# Patient Record
Sex: Female | Born: 1950 | Race: White | Hispanic: No | Smoking: Former smoker
Health system: Southern US, Community
[De-identification: ages and names within clinical notes are randomized; demographics above are authoritative.]

## PROBLEM LIST (undated history)

## (undated) DIAGNOSIS — I639 Cerebral infarction, unspecified: Secondary | ICD-10-CM

## (undated) DIAGNOSIS — M1990 Unspecified osteoarthritis, unspecified site: Secondary | ICD-10-CM

## (undated) DIAGNOSIS — E039 Hypothyroidism, unspecified: Secondary | ICD-10-CM

## (undated) DIAGNOSIS — I10 Essential (primary) hypertension: Secondary | ICD-10-CM

## (undated) DIAGNOSIS — J189 Pneumonia, unspecified organism: Secondary | ICD-10-CM

## (undated) DIAGNOSIS — E079 Disorder of thyroid, unspecified: Secondary | ICD-10-CM

## (undated) DIAGNOSIS — K219 Gastro-esophageal reflux disease without esophagitis: Secondary | ICD-10-CM

## (undated) DIAGNOSIS — T7840XA Allergy, unspecified, initial encounter: Secondary | ICD-10-CM

## (undated) HISTORY — DX: Allergy, unspecified, initial encounter: T78.40XA

## (undated) HISTORY — PX: APPENDECTOMY: SHX54

## (undated) HISTORY — PX: CATARACT EXTRACTION: SUR2

## (undated) HISTORY — DX: Disorder of thyroid, unspecified: E07.9

## (undated) HISTORY — PX: BREAST SURGERY: SHX581

## (undated) HISTORY — DX: Essential (primary) hypertension: I10

## (undated) HISTORY — PX: TUBAL LIGATION: SHX77

## (undated) HISTORY — PX: ABDOMINAL HYSTERECTOMY: SHX81

## (undated) HISTORY — PX: CHOLECYSTECTOMY: SHX55

## (undated) HISTORY — DX: Unspecified osteoarthritis, unspecified site: M19.90

---

## 2015-08-04 DIAGNOSIS — I10 Essential (primary) hypertension: Secondary | ICD-10-CM | POA: Diagnosis not present

## 2015-08-04 DIAGNOSIS — K219 Gastro-esophageal reflux disease without esophagitis: Secondary | ICD-10-CM | POA: Diagnosis not present

## 2015-08-04 DIAGNOSIS — Z0000 Encounter for general adult medical examination without abnormal findings: Secondary | ICD-10-CM | POA: Diagnosis not present

## 2015-08-04 DIAGNOSIS — E781 Pure hyperglyceridemia: Secondary | ICD-10-CM | POA: Insufficient documentation

## 2015-08-04 DIAGNOSIS — E782 Mixed hyperlipidemia: Secondary | ICD-10-CM | POA: Diagnosis not present

## 2015-08-04 DIAGNOSIS — M8580 Other specified disorders of bone density and structure, unspecified site: Secondary | ICD-10-CM | POA: Insufficient documentation

## 2015-08-04 DIAGNOSIS — E039 Hypothyroidism, unspecified: Secondary | ICD-10-CM | POA: Insufficient documentation

## 2015-08-04 DIAGNOSIS — F5101 Primary insomnia: Secondary | ICD-10-CM | POA: Insufficient documentation

## 2015-08-04 DIAGNOSIS — R21 Rash and other nonspecific skin eruption: Secondary | ICD-10-CM | POA: Diagnosis not present

## 2015-08-04 DIAGNOSIS — Z79899 Other long term (current) drug therapy: Secondary | ICD-10-CM | POA: Diagnosis not present

## 2015-11-20 DIAGNOSIS — R21 Rash and other nonspecific skin eruption: Secondary | ICD-10-CM | POA: Diagnosis not present

## 2015-11-20 DIAGNOSIS — I10 Essential (primary) hypertension: Secondary | ICD-10-CM | POA: Diagnosis not present

## 2015-11-20 DIAGNOSIS — R252 Cramp and spasm: Secondary | ICD-10-CM | POA: Diagnosis not present

## 2015-11-27 DIAGNOSIS — H2513 Age-related nuclear cataract, bilateral: Secondary | ICD-10-CM | POA: Diagnosis not present

## 2015-11-27 DIAGNOSIS — H25013 Cortical age-related cataract, bilateral: Secondary | ICD-10-CM | POA: Diagnosis not present

## 2015-12-03 DIAGNOSIS — H2511 Age-related nuclear cataract, right eye: Secondary | ICD-10-CM | POA: Diagnosis not present

## 2015-12-17 DIAGNOSIS — H2512 Age-related nuclear cataract, left eye: Secondary | ICD-10-CM | POA: Diagnosis not present

## 2015-12-17 DIAGNOSIS — H2511 Age-related nuclear cataract, right eye: Secondary | ICD-10-CM | POA: Diagnosis not present

## 2016-01-07 DIAGNOSIS — H2512 Age-related nuclear cataract, left eye: Secondary | ICD-10-CM | POA: Diagnosis not present

## 2016-03-03 DIAGNOSIS — Z1231 Encounter for screening mammogram for malignant neoplasm of breast: Secondary | ICD-10-CM | POA: Diagnosis not present

## 2016-03-03 DIAGNOSIS — Z23 Encounter for immunization: Secondary | ICD-10-CM | POA: Diagnosis not present

## 2016-03-03 DIAGNOSIS — E039 Hypothyroidism, unspecified: Secondary | ICD-10-CM | POA: Diagnosis not present

## 2016-03-03 DIAGNOSIS — E782 Mixed hyperlipidemia: Secondary | ICD-10-CM | POA: Diagnosis not present

## 2016-03-03 DIAGNOSIS — I10 Essential (primary) hypertension: Secondary | ICD-10-CM | POA: Diagnosis not present

## 2016-03-18 DIAGNOSIS — Z1231 Encounter for screening mammogram for malignant neoplasm of breast: Secondary | ICD-10-CM | POA: Diagnosis not present

## 2016-05-27 DIAGNOSIS — E039 Hypothyroidism, unspecified: Secondary | ICD-10-CM | POA: Diagnosis not present

## 2016-05-27 DIAGNOSIS — I10 Essential (primary) hypertension: Secondary | ICD-10-CM | POA: Diagnosis not present

## 2016-05-27 DIAGNOSIS — E782 Mixed hyperlipidemia: Secondary | ICD-10-CM | POA: Diagnosis not present

## 2016-06-03 DIAGNOSIS — M8580 Other specified disorders of bone density and structure, unspecified site: Secondary | ICD-10-CM | POA: Diagnosis not present

## 2016-06-03 DIAGNOSIS — E2839 Other primary ovarian failure: Secondary | ICD-10-CM | POA: Diagnosis not present

## 2016-06-03 DIAGNOSIS — Z9189 Other specified personal risk factors, not elsewhere classified: Secondary | ICD-10-CM | POA: Diagnosis not present

## 2016-06-03 DIAGNOSIS — R7301 Impaired fasting glucose: Secondary | ICD-10-CM | POA: Diagnosis not present

## 2016-06-03 DIAGNOSIS — Z1211 Encounter for screening for malignant neoplasm of colon: Secondary | ICD-10-CM | POA: Diagnosis not present

## 2016-06-03 DIAGNOSIS — E039 Hypothyroidism, unspecified: Secondary | ICD-10-CM | POA: Diagnosis not present

## 2016-06-03 DIAGNOSIS — D72819 Decreased white blood cell count, unspecified: Secondary | ICD-10-CM | POA: Diagnosis not present

## 2016-06-03 DIAGNOSIS — I10 Essential (primary) hypertension: Secondary | ICD-10-CM | POA: Diagnosis not present

## 2016-06-03 DIAGNOSIS — Z1159 Encounter for screening for other viral diseases: Secondary | ICD-10-CM | POA: Diagnosis not present

## 2016-06-03 DIAGNOSIS — E782 Mixed hyperlipidemia: Secondary | ICD-10-CM | POA: Diagnosis not present

## 2016-07-01 DIAGNOSIS — E2839 Other primary ovarian failure: Secondary | ICD-10-CM | POA: Diagnosis not present

## 2016-07-01 DIAGNOSIS — Z780 Asymptomatic menopausal state: Secondary | ICD-10-CM | POA: Diagnosis not present

## 2016-09-09 DIAGNOSIS — I10 Essential (primary) hypertension: Secondary | ICD-10-CM | POA: Diagnosis not present

## 2016-12-01 DIAGNOSIS — R51 Headache: Secondary | ICD-10-CM | POA: Diagnosis not present

## 2016-12-01 DIAGNOSIS — I10 Essential (primary) hypertension: Secondary | ICD-10-CM | POA: Diagnosis not present

## 2016-12-01 DIAGNOSIS — J0100 Acute maxillary sinusitis, unspecified: Secondary | ICD-10-CM | POA: Diagnosis not present

## 2017-02-12 DIAGNOSIS — S339XXA Sprain of unspecified parts of lumbar spine and pelvis, initial encounter: Secondary | ICD-10-CM | POA: Diagnosis not present

## 2017-02-12 DIAGNOSIS — I10 Essential (primary) hypertension: Secondary | ICD-10-CM | POA: Diagnosis not present

## 2017-03-17 DIAGNOSIS — I10 Essential (primary) hypertension: Secondary | ICD-10-CM | POA: Diagnosis not present

## 2017-03-17 DIAGNOSIS — Z23 Encounter for immunization: Secondary | ICD-10-CM | POA: Diagnosis not present

## 2017-05-30 DIAGNOSIS — J208 Acute bronchitis due to other specified organisms: Secondary | ICD-10-CM | POA: Diagnosis not present

## 2017-05-30 DIAGNOSIS — N643 Galactorrhea not associated with childbirth: Secondary | ICD-10-CM | POA: Diagnosis not present

## 2017-06-20 DIAGNOSIS — R928 Other abnormal and inconclusive findings on diagnostic imaging of breast: Secondary | ICD-10-CM | POA: Diagnosis not present

## 2017-06-20 DIAGNOSIS — N6489 Other specified disorders of breast: Secondary | ICD-10-CM | POA: Diagnosis not present

## 2017-06-20 DIAGNOSIS — N6452 Nipple discharge: Secondary | ICD-10-CM | POA: Diagnosis not present

## 2017-07-28 DIAGNOSIS — E039 Hypothyroidism, unspecified: Secondary | ICD-10-CM | POA: Diagnosis not present

## 2017-07-28 DIAGNOSIS — E038 Other specified hypothyroidism: Secondary | ICD-10-CM | POA: Diagnosis not present

## 2017-07-28 DIAGNOSIS — I10 Essential (primary) hypertension: Secondary | ICD-10-CM | POA: Diagnosis not present

## 2017-07-28 DIAGNOSIS — M19049 Primary osteoarthritis, unspecified hand: Secondary | ICD-10-CM | POA: Diagnosis not present

## 2017-07-28 DIAGNOSIS — E782 Mixed hyperlipidemia: Secondary | ICD-10-CM | POA: Diagnosis not present

## 2017-09-19 DIAGNOSIS — R928 Other abnormal and inconclusive findings on diagnostic imaging of breast: Secondary | ICD-10-CM | POA: Diagnosis not present

## 2017-09-19 DIAGNOSIS — R922 Inconclusive mammogram: Secondary | ICD-10-CM | POA: Diagnosis not present

## 2017-09-19 DIAGNOSIS — N6489 Other specified disorders of breast: Secondary | ICD-10-CM | POA: Diagnosis not present

## 2017-11-07 DIAGNOSIS — M189 Osteoarthritis of first carpometacarpal joint, unspecified: Secondary | ICD-10-CM | POA: Insufficient documentation

## 2017-11-07 DIAGNOSIS — M13841 Other specified arthritis, right hand: Secondary | ICD-10-CM | POA: Diagnosis not present

## 2017-11-07 DIAGNOSIS — M13842 Other specified arthritis, left hand: Secondary | ICD-10-CM | POA: Diagnosis not present

## 2017-11-07 DIAGNOSIS — M79642 Pain in left hand: Secondary | ICD-10-CM | POA: Diagnosis not present

## 2017-11-07 DIAGNOSIS — M1812 Unilateral primary osteoarthritis of first carpometacarpal joint, left hand: Secondary | ICD-10-CM | POA: Diagnosis not present

## 2017-11-07 DIAGNOSIS — M79641 Pain in right hand: Secondary | ICD-10-CM | POA: Diagnosis not present

## 2017-11-21 DIAGNOSIS — R200 Anesthesia of skin: Secondary | ICD-10-CM | POA: Diagnosis not present

## 2017-11-29 DIAGNOSIS — M79642 Pain in left hand: Secondary | ICD-10-CM | POA: Diagnosis not present

## 2017-11-29 DIAGNOSIS — M65839 Other synovitis and tenosynovitis, unspecified forearm: Secondary | ICD-10-CM | POA: Diagnosis not present

## 2017-11-29 DIAGNOSIS — R200 Anesthesia of skin: Secondary | ICD-10-CM | POA: Diagnosis not present

## 2017-11-29 DIAGNOSIS — M79641 Pain in right hand: Secondary | ICD-10-CM | POA: Diagnosis not present

## 2017-12-20 DIAGNOSIS — N6320 Unspecified lump in the left breast, unspecified quadrant: Secondary | ICD-10-CM | POA: Diagnosis not present

## 2017-12-20 DIAGNOSIS — N6452 Nipple discharge: Secondary | ICD-10-CM | POA: Diagnosis not present

## 2018-01-04 ENCOUNTER — Other Ambulatory Visit: Payer: Self-pay | Admitting: Family Medicine

## 2018-01-04 DIAGNOSIS — N6320 Unspecified lump in the left breast, unspecified quadrant: Secondary | ICD-10-CM

## 2018-01-05 DIAGNOSIS — Z6829 Body mass index (BMI) 29.0-29.9, adult: Secondary | ICD-10-CM | POA: Diagnosis not present

## 2018-01-05 DIAGNOSIS — I10 Essential (primary) hypertension: Secondary | ICD-10-CM | POA: Diagnosis not present

## 2018-01-05 DIAGNOSIS — M5412 Radiculopathy, cervical region: Secondary | ICD-10-CM | POA: Diagnosis not present

## 2018-01-09 ENCOUNTER — Ambulatory Visit
Admission: RE | Admit: 2018-01-09 | Discharge: 2018-01-09 | Disposition: A | Discharge status: Home / Self Care | Payer: PPO | Source: Ambulatory Visit | Attending: Family Medicine | Admitting: Family Medicine

## 2018-01-09 DIAGNOSIS — N6320 Unspecified lump in the left breast, unspecified quadrant: Secondary | ICD-10-CM

## 2018-01-09 DIAGNOSIS — D242 Benign neoplasm of left breast: Secondary | ICD-10-CM | POA: Diagnosis not present

## 2018-01-09 MED ORDER — GADOBENATE DIMEGLUMINE 529 MG/ML IV SOLN
15.0000 mL | Freq: Once | INTRAVENOUS | Status: AC | PRN
  Administered 2018-01-09: 15 mL via INTRAVENOUS

## 2018-01-12 DIAGNOSIS — D369 Benign neoplasm, unspecified site: Secondary | ICD-10-CM | POA: Diagnosis not present

## 2018-01-12 DIAGNOSIS — E782 Mixed hyperlipidemia: Secondary | ICD-10-CM | POA: Diagnosis not present

## 2018-01-12 DIAGNOSIS — I10 Essential (primary) hypertension: Secondary | ICD-10-CM | POA: Diagnosis not present

## 2018-01-12 DIAGNOSIS — E039 Hypothyroidism, unspecified: Secondary | ICD-10-CM | POA: Diagnosis not present

## 2018-01-19 ENCOUNTER — Other Ambulatory Visit: Payer: Self-pay | Admitting: General Surgery

## 2018-01-19 DIAGNOSIS — D242 Benign neoplasm of left breast: Secondary | ICD-10-CM | POA: Diagnosis not present

## 2018-01-19 DIAGNOSIS — L4050 Arthropathic psoriasis, unspecified: Secondary | ICD-10-CM | POA: Diagnosis not present

## 2018-01-19 DIAGNOSIS — Z923 Personal history of irradiation: Secondary | ICD-10-CM | POA: Diagnosis not present

## 2018-01-19 DIAGNOSIS — Z6829 Body mass index (BMI) 29.0-29.9, adult: Secondary | ICD-10-CM | POA: Diagnosis not present

## 2018-01-19 DIAGNOSIS — I10 Essential (primary) hypertension: Secondary | ICD-10-CM | POA: Diagnosis not present

## 2018-01-25 ENCOUNTER — Encounter: Payer: Self-pay | Admitting: Family Medicine

## 2018-01-25 ENCOUNTER — Ambulatory Visit (INDEPENDENT_AMBULATORY_CARE_PROVIDER_SITE_OTHER): Payer: PPO | Admitting: Family Medicine

## 2018-01-25 VITALS — BP 160/96 | HR 64 | Ht 64.0 in | Wt 170.8 lb

## 2018-01-25 DIAGNOSIS — D369 Benign neoplasm, unspecified site: Secondary | ICD-10-CM

## 2018-01-25 DIAGNOSIS — E890 Postprocedural hypothyroidism: Secondary | ICD-10-CM | POA: Diagnosis not present

## 2018-01-25 DIAGNOSIS — I10 Essential (primary) hypertension: Secondary | ICD-10-CM | POA: Diagnosis not present

## 2018-01-25 DIAGNOSIS — M180 Bilateral primary osteoarthritis of first carpometacarpal joints: Secondary | ICD-10-CM

## 2018-01-25 MED ORDER — AMLODIPINE BESYLATE 10 MG PO TABS: 10.0000 mg | ORAL_TABLET | Freq: Every day | ORAL | 3 refills | Status: DC

## 2018-01-25 NOTE — Assessment & Plan Note (Signed)
Seen by orthopedist, dx with psoriatic arthritis She will continue curcumin for now  Cystic lesion noted on dorsum of L foot, not bothersome at this time.  She will let me know if this changes.

## 2018-01-25 NOTE — Assessment & Plan Note (Signed)
S/p tx with I-131 for hyperthyroidism Update TSH today

## 2018-01-25 NOTE — Assessment & Plan Note (Signed)
Has f/u mammogram in 6 months with the [facility].  Will decide about surgery at that time.

## 2018-01-25 NOTE — Assessment & Plan Note (Addendum)
BP elevated today in clinic Normal neuro exam, no focal deficits noted.  ?if dizziness from elevated BP Has not been well controlled with current medication Feels like she may be having side effects related to this.  Will change to amlodipine Reminded to follow low salt diet.  F/u 2-3 weeks

## 2018-01-25 NOTE — Progress Notes (Signed)
Megan Martinez - 67 y.o. female MRN [id number]  Date of birth: 1951-03-08  Subjective Chief Complaint  Patient presents with  . Hypertension    HPI Megan Martinez is a 67 y.o. female with a history of HTN, postablative hypothyroidism, hyperlipidemia, and arthritis here today to establish care with new pcp.    -HTN:  History of HTN, current treatment with diovan-hct.  She also is taking a potassium supplement with this.  Had episode yesterday where she felt dizzy and didn't feel like her legs would support her while she was at work.  Had to have some help getting back to her office.  This affected both legs.  This lasted several minutes and then resolved.  Checked out by ems who recommended that she follow up with PCP.  She feels like her blood pressure medication is not working as well as it should be.  Her blood pressure remains high and she has had a persistent cough since starting this medication.  She does take as directed.  She is quite active and denies a high salt diet.   -arthritis:  Noticed enlarged painful joints of the feet and hands.  Seen by [facility] and was told she has psoriatic arthritis based on xray findings.  She is managing with anti-inflammatory medication as need for now.  Denies any rashes.    -Hypothyroidism:  History of hyperthyroidism s/p I131 tx, now with hypothyroidism.  Denies any symptoms of hyper/hypo-thyroidism at this time.  She is compliant with medication.   -Breast mass:  Had recent mammogram and biopsy showing intraductal papilloma.  Decreased pain and nipple discharge at this time.  Has f/u mammogram in 6 months, will decide on surgery at that time.   ROS:  A comprehensive ROS was completed and negative except as noted per HPI  Allergies  Allergen Reactions  . Latex Itching and Rash  . Statins Other (See Comments)    Myalgias, "sick"   . Hydrocodone-Homatropine   . Penicillin G   . Amoxicillin Rash  . Codeine Nausea Only    Past  Medical History:  Diagnosis Date  . Allergy   . Arthritis   . Hypertension   . Thyroid disease     Past Surgical History:  Procedure Laterality Date  . ABDOMINAL HYSTERECTOMY    . APPENDECTOMY    . CATARACT EXTRACTION    . CHOLECYSTECTOMY    . TUBAL LIGATION      Social History   Socioeconomic History  . Marital status: Married    Spouse name: Not on file  . Number of children: Not on file  . Years of education: Not on file  . Highest education level: Not on file  Occupational History  . Not on file  Social Needs  . Financial resource strain: Not on file  . Food insecurity:    Worry: Not on file    Inability: Not on file  . Transportation needs:    Medical: Not on file    Non-medical: Not on file  Tobacco Use  . Smoking status: Former Research scientist (life sciences)  . Smokeless tobacco: Never Used  Substance and Sexual Activity  . Alcohol use: Yes    Comment: occass.   . Drug use: Never  . Sexual activity: Not on file  Lifestyle  . Physical activity:    Days per week: Not on file    Minutes per session: Not on file  . Stress: Not on file  Relationships  . Social connections:  Talks on phone: Not on file    Gets together: Not on file    Attends religious service: Not on file    Active member of club or organization: Not on file    Attends meetings of clubs or organizations: Not on file    Relationship status: Not on file  Other Topics Concern  . Not on file  Social History Narrative  . Not on file    History reviewed. No pertinent family history.  Health Maintenance  Topic Date Due  . Hepatitis C Screening  1950-06-07  . TETANUS/TDAP  07/29/1969  . MAMMOGRAM  07/29/2000  . COLONOSCOPY  07/29/2000  . DEXA SCAN  07/30/2015  . PNA vac Low Risk Adult (1 of 2 - PCV13) 07/30/2015  . INFLUENZA VACCINE  12/22/2017     ----------------------------------------------------------------------------------------------------------------------------------------------------------------------------------------------------------------- Physical Exam BP (!) 160/96   Pulse 64   Ht 5\' 4"  (1.626 m)   Wt 170 lb 12.8 oz (77.5 kg)   SpO2 98%   BMI 29.32 kg/m   Physical Exam  Constitutional: She is oriented to person, place, and time. No distress.  HENT:  Head: Normocephalic and atraumatic.  Mouth/Throat: Oropharynx is clear and moist.  Eyes: No scleral icterus.  Neck: Normal range of motion. Neck supple. No thyromegaly present.  Cardiovascular: Normal rate, regular rhythm, normal heart sounds and intact distal pulses.  Pulmonary/Chest: Effort normal and breath sounds normal.  Musculoskeletal:  Cystic nodule on dorsum of L foot.   Hands with mild swelling and tender nodules on fingers.   Lymphadenopathy:    She has no cervical adenopathy.  Neurological: She is alert and oriented to person, place, and time. No cranial nerve deficit. She exhibits normal muscle tone. Coordination normal.  Normal gait Negative rhomberg  Skin: Skin is warm and dry. No rash noted.  Psychiatric: She has a normal mood and affect. Her behavior is normal.    ------------------------------------------------------------------------------------------------------------------------------------------------------------------------------------------------------------------- Assessment and Plan  Postablative hypothyroidism S/p tx with I-131 for hyperthyroidism Update TSH today  Essential hypertension BP elevated today in clinic Normal neuro exam, no focal deficits noted.  ?if dizziness from elevated BP Has not been well controlled with current medication Feels like she may be having side effects related to this.  Will change to amlodipine Reminded to follow low salt diet.  F/u 2-3 weeks  Osteoarthritis of carpometacarpal (CMC)  joint of thumb Seen by orthopedist, dx with psoriatic arthritis She will continue curcumin for now  Cystic lesion noted on dorsum of L foot, not bothersome at this time.  She will let me know if this changes.   Intraductal papilloma Has f/u mammogram in 6 months with the breast center.  Will decide about surgery at that time.

## 2018-01-25 NOTE — Patient Instructions (Signed)
It was very nice to meet you Stop valsartan-Hctz Start amlodipine Follow up with me in 2-3 weeks to review blood pressure.

## 2018-01-26 LAB — COMPREHENSIVE METABOLIC PANEL
ALT: 19 U/L (ref 0–35)
AST: 22 U/L (ref 0–37)
Albumin: 4.5 g/dL (ref 3.5–5.2)
Alkaline Phosphatase: 43 U/L (ref 39–117)
BUN: 10 mg/dL (ref 6–23)
CO2: 32 mEq/L (ref 19–32)
Calcium: 9.9 mg/dL (ref 8.4–10.5)
Chloride: 101 mEq/L (ref 96–112)
Creatinine, Ser: 0.89 mg/dL (ref 0.40–1.20)
GFR: 67.14 mL/min (ref >60.00)
Glucose, Bld: 123 mg/dL — ABNORMAL HIGH (ref 70–99)
Potassium: 4.1 mEq/L (ref 3.5–5.1)
Sodium: 141 mEq/L (ref 135–145)
Total Bilirubin: 0.5 mg/dL (ref 0.2–1.2)
Total Protein: 7.5 g/dL (ref 6.0–8.3)

## 2018-01-26 LAB — CBC
HCT: 42.7 % (ref 36.0–46.0)
Hemoglobin: 14.5 g/dL (ref 12.0–15.0)
MCHC: 33.9 g/dL (ref 30.0–36.0)
MCV: 84.4 fl (ref 78.0–100.0)
Platelets: 277 10*3/uL (ref 150.0–400.0)
RBC: 5.06 Mil/uL (ref 3.87–5.11)
RDW: 14.1 % (ref 11.5–15.5)
WBC: 5.4 10*3/uL (ref 4.0–10.5)

## 2018-01-26 LAB — TSH: TSH: 1.67 u[IU]/mL (ref 0.35–4.50)

## 2018-01-30 NOTE — Progress Notes (Signed)
Glucose is slightlyl elevated, ok for non-fasting labs. Other labs are normal.

## 2018-02-08 ENCOUNTER — Ambulatory Visit (INDEPENDENT_AMBULATORY_CARE_PROVIDER_SITE_OTHER): Payer: PPO | Admitting: Family Medicine

## 2018-02-08 ENCOUNTER — Encounter: Payer: Self-pay | Admitting: Family Medicine

## 2018-02-08 VITALS — BP 160/90 | HR 81 | Temp 98.0°F | Ht 64.0 in | Wt 170.6 lb

## 2018-02-08 DIAGNOSIS — I10 Essential (primary) hypertension: Secondary | ICD-10-CM

## 2018-02-08 MED ORDER — CANDESARTAN CILEXETIL-HCTZ 32-25 MG PO TABS: 1.0000 | ORAL_TABLET | Freq: Every day | ORAL | 1 refills | Status: DC

## 2018-02-08 NOTE — Patient Instructions (Addendum)
Start new medication for Blood pressure Stop amlodipine Follow a low salt diet Return in 1 month   DASH Eating Plan DASH stands for "Dietary Approaches to Stop Hypertension." The DASH eating plan is a healthy eating plan that has been shown to reduce high blood pressure (hypertension). It may also reduce your risk for type 2 diabetes, heart disease, and stroke. The DASH eating plan may also help with weight loss. What are tips for following this plan? General guidelines  Avoid eating more than 2,300 mg (milligrams) of salt (sodium) a day. If you have hypertension, you may need to reduce your sodium intake to 1,500 mg a day.  Limit alcohol intake to no more than 1 drink a day for nonpregnant women and 2 drinks a day for men. One drink equals 12 oz of beer, 5 oz of wine, or 1 oz of hard liquor.  Work with your health care provider to maintain a healthy body weight or to lose weight. Ask what an ideal weight is for you.  Get at least 30 minutes of exercise that causes your heart to beat faster (aerobic exercise) most days of the week. Activities may include walking, swimming, or biking.  Work with your health care provider or diet and nutrition specialist (dietitian) to adjust your eating plan to your individual calorie needs. Reading food labels  Check food labels for the amount of sodium per serving. Choose foods with less than 5 percent of the Daily Value of sodium. Generally, foods with less than 300 mg of sodium per serving fit into this eating plan.  To find whole grains, look for the word "whole" as the first word in the ingredient list. Shopping  Buy products labeled as "low-sodium" or "no salt added."  Buy fresh foods. Avoid canned foods and premade or frozen meals. Cooking  Avoid adding salt when cooking. Use salt-free seasonings or herbs instead of table salt or sea salt. Check with your health care provider or pharmacist before using salt substitutes.  Do not fry foods.  Cook foods using healthy methods such as baking, boiling, grilling, and broiling instead.  Cook with heart-healthy oils, such as olive, canola, soybean, or sunflower oil. Meal planning   Eat a balanced diet that includes: ? 5 or more servings of fruits and vegetables each day. At each meal, try to fill half of your plate with fruits and vegetables. ? Up to 6-8 servings of whole grains each day. ? Less than 6 oz of lean meat, poultry, or fish each day. A 3-oz serving of meat is about the same size as a deck of cards. One egg equals 1 oz. ? 2 servings of low-fat dairy each day. ? A serving of nuts, seeds, or beans 5 times each week. ? Heart-healthy fats. Healthy fats called Omega-3 fatty acids are found in foods such as flaxseeds and coldwater fish, like sardines, salmon, and mackerel.  Limit how much you eat of the following: ? Canned or prepackaged foods. ? Food that is high in trans fat, such as fried foods. ? Food that is high in saturated fat, such as fatty meat. ? Sweets, desserts, sugary drinks, and other foods with added sugar. ? Full-fat dairy products.  Do not salt foods before eating.  Try to eat at least 2 vegetarian meals each week.  Eat more home-cooked food and less restaurant, buffet, and fast food.  When eating at a restaurant, ask that your food be prepared with less salt or no salt, if possible.  What foods are recommended? The items listed may not be a complete list. Talk with your dietitian about what dietary choices are best for you. Grains Whole-grain or whole-wheat bread. Whole-grain or whole-wheat pasta. Brown rice. [place]. Bulgur. Whole-grain and low-sodium cereals. Pita bread. Low-fat, low-sodium crackers. Whole-wheat flour tortillas. Vegetables Fresh or frozen vegetables (raw, steamed, roasted, or grilled). Low-sodium or reduced-sodium tomato and vegetable juice. Low-sodium or reduced-sodium tomato sauce and tomato paste. Low-sodium or reduced-sodium  canned vegetables. Fruits All fresh, dried, or frozen fruit. Canned fruit in natural juice (without added sugar). Meat and other protein foods Skinless chicken or [place]. Ground chicken or [place]. Pork with fat trimmed off. Fish and seafood. Egg whites. Dried beans, peas, or lentils. Unsalted nuts, nut butters, and seeds. Unsalted canned beans. Lean cuts of beef with fat trimmed off. Low-sodium, lean deli meat. Dairy Low-fat (1%) or fat-free (skim) milk. Fat-free, low-fat, or reduced-fat cheeses. Nonfat, low-sodium ricotta or cottage cheese. Low-fat or nonfat yogurt. Low-fat, low-sodium cheese. Fats and oils Soft margarine without trans fats. Vegetable oil. Low-fat, reduced-fat, or light mayonnaise and salad dressings (reduced-sodium). Canola, safflower, olive, soybean, and sunflower oils. Avocado. Seasoning and other foods Herbs. Spices. Seasoning mixes without salt. Unsalted popcorn and pretzels. Fat-free sweets. What foods are not recommended? The items listed may not be a complete list. Talk with your dietitian about what dietary choices are best for you. Grains Baked goods made with fat, such as croissants, muffins, or some breads. Dry pasta or rice meal packs. Vegetables Creamed or fried vegetables. Vegetables in a cheese sauce. Regular canned vegetables (not low-sodium or reduced-sodium). Regular canned tomato sauce and paste (not low-sodium or reduced-sodium). Regular tomato and vegetable juice (not low-sodium or reduced-sodium). Angie Fava. Olives. Fruits Canned fruit in a light or heavy syrup. Fried fruit. Fruit in cream or butter sauce. Meat and other protein foods Fatty cuts of meat. Ribs. Fried meat. Berniece Salines. Sausage. Bologna and other processed lunch meats. Salami. Fatback. Hotdogs. Bratwurst. Salted nuts and seeds. Canned beans with added salt. Canned or smoked fish. Whole eggs or egg yolks. Chicken or [place] with skin. Dairy Whole or 2% milk, cream, and half-and-half. Whole or  full-fat cream cheese. Whole-fat or sweetened yogurt. Full-fat cheese. Nondairy creamers. Whipped toppings. Processed cheese and cheese spreads. Fats and oils Butter. Stick margarine. Lard. Shortening. Ghee. Bacon fat. Tropical oils, such as coconut, palm kernel, or palm oil. Seasoning and other foods Salted popcorn and pretzels. Onion salt, garlic salt, seasoned salt, table salt, and sea salt. Worcestershire sauce. Tartar sauce. Barbecue sauce. Teriyaki sauce. Soy sauce, including reduced-sodium. Steak sauce. Canned and packaged gravies. Fish sauce. Oyster sauce. Cocktail sauce. Horseradish that you find on the shelf. Ketchup. Mustard. Meat flavorings and tenderizers. Bouillon cubes. Hot sauce and Tabasco sauce. Premade or packaged marinades. Premade or packaged taco seasonings. Relishes. Regular salad dressings. Where to find more information:  National Heart, Lung, and [place]: https://wilson-eaton.com/  American Heart Association: www.heart.org Summary  The DASH eating plan is a healthy eating plan that has been shown to reduce high blood pressure (hypertension). It may also reduce your risk for type 2 diabetes, heart disease, and stroke.  With the DASH eating plan, you should limit salt (sodium) intake to 2,300 mg a day. If you have hypertension, you may need to reduce your sodium intake to 1,500 mg a day.  When on the DASH eating plan, aim to eat more fresh fruits and vegetables, whole grains, lean proteins, low-fat dairy, and heart-healthy fats.  Work  with your health care provider or diet and nutrition specialist (dietitian) to adjust your eating plan to your individual calorie needs. This information is not intended to replace advice given to you by your health care provider. Make sure you discuss any questions you have with your health care provider. Document Released: 04/29/2011 Document Revised: 05/03/2016 Document Reviewed: 05/03/2016 Elsevier Interactive Patient Education  [employer].

## 2018-02-08 NOTE — Progress Notes (Signed)
Megan Martinez - 67 y.o. female MRN [id number]  Date of birth: 1951-04-27  Subjective Chief Complaint  Patient presents with  . Hypertension    HPI Megan Martinez is a 67 y.o. female with history of HTN, thyroid disease and chronic cough here today for follow up of HTN.   -HTN:  Previous treatment with Diovan HCT, didn't feel that this was working well and might be contributing to her chronic cough.  She was changed to amlodipine at last appt.  She is not happy with side effects of edema associated with amlodipine and her BP is still not controlled.  Readings from home 150-162/90-98, HR 50-60.  She is having headaches but denies anginal symptoms, shortness of breath, palpitations, dizziness, or vision change.  She would be interested in trying another ARB medication.    ROS:  A comprehensive ROS was completed and negative except as noted per HPI  Allergies  Allergen Reactions  . Latex Itching and Rash  . Statins Other (See Comments)    Myalgias, "sick"   . Hydrocodone-Homatropine   . Penicillin G   . Amoxicillin Rash  . Codeine Nausea Only    Past Medical History:  Diagnosis Date  . Allergy   . Arthritis   . Hypertension   . Thyroid disease     Past Surgical History:  Procedure Laterality Date  . ABDOMINAL HYSTERECTOMY    . APPENDECTOMY    . CATARACT EXTRACTION    . CHOLECYSTECTOMY    . TUBAL LIGATION      Social History   Socioeconomic History  . Marital status: Married    Spouse name: Not on file  . Number of children: Not on file  . Years of education: Not on file  . Highest education level: Not on file  Occupational History  . Not on file  Social Needs  . Financial resource strain: Not on file  . Food insecurity:    Worry: Not on file    Inability: Not on file  . Transportation needs:    Medical: Not on file    Non-medical: Not on file  Tobacco Use  . Smoking status: Former Smoker    Last attempt to quit: 1970    Years since quitting: 49.7  .  Smokeless tobacco: Never Used  Substance and Sexual Activity  . Alcohol use: Yes    Comment: occass.   . Drug use: Never  . Sexual activity: Not on file  Lifestyle  . Physical activity:    Days per week: Not on file    Minutes per session: Not on file  . Stress: Not on file  Relationships  . Social connections:    Talks on phone: Not on file    Gets together: Not on file    Attends religious service: Not on file    Active member of club or organization: Not on file    Attends meetings of clubs or organizations: Not on file    Relationship status: Not on file  Other Topics Concern  . Not on file  Social History Narrative  . Not on file    No family history on file.  Health Maintenance  Topic Date Due  . Hepatitis C Screening  [birth year]  . TETANUS/TDAP  07/29/1969  . MAMMOGRAM  07/29/2000  . COLONOSCOPY  07/29/2000  . DEXA SCAN  07/30/2015  . PNA vac Low Risk Adult (1 of 2 - PCV13) 07/30/2015  . INFLUENZA VACCINE  12/22/2017    -----------------------------------------------------------------------------------------------------------------------------------------------------------------------------------------------------------------  Physical Exam BP (!) 160/90 (BP Location: Left Arm, Patient Position: Sitting, Cuff Size: Normal)   Pulse 81   Temp 98 F (36.7 C) (Oral)   Ht 5\' 4"  (1.626 m)   Wt 170 lb 9.6 oz (77.4 kg)   SpO2 97%   BMI 29.28 kg/m   Physical Exam  Constitutional: She is oriented to person, place, and time. She appears well-nourished. No distress.  HENT:  Head: Normocephalic and atraumatic.  Mouth/Throat: Oropharynx is clear and moist.  Eyes: No scleral icterus.  Neck: Neck supple. No thyromegaly present.  Cardiovascular: Normal rate, regular rhythm and normal heart sounds.  Pulmonary/Chest: Effort normal and breath sounds normal.  Musculoskeletal: She exhibits edema (trace, non-pitting).  Neurological: She is alert and oriented to person,  place, and time.  Skin: Skin is warm and dry.  Psychiatric: She has a normal mood and affect. Her behavior is normal.    ------------------------------------------------------------------------------------------------------------------------------------------------------------------------------------------------------------------- Assessment and Plan  Essential hypertension -BP is not well controlled with amlodipine and unsatisfied with side effect profile, will discontinue -Will start atacand hct -Considered beta blocker as well however HR has been a little low at home.  -Reminded to follow low salt/DASH diet.  -Return 1 month for BP recheck/labs

## 2018-02-08 NOTE — Assessment & Plan Note (Signed)
-  BP is not well controlled with amlodipine and unsatisfied with side effect profile, will discontinue -Will start atacand hct -Considered beta blocker as well however HR has been a little low at home.  -Reminded to follow low salt/DASH diet.  -Return 1 month for BP recheck/labs

## 2018-02-23 ENCOUNTER — Encounter: Payer: Self-pay | Admitting: Family Medicine

## 2018-02-23 ENCOUNTER — Ambulatory Visit (INDEPENDENT_AMBULATORY_CARE_PROVIDER_SITE_OTHER): Payer: PPO

## 2018-02-23 DIAGNOSIS — Z23 Encounter for immunization: Secondary | ICD-10-CM

## 2018-02-23 NOTE — Progress Notes (Signed)
Patient came into the office today to receive her flu vaccine. Patient received vaccine in her left deltoid & tolerated injection well. No signs/symptoms of a reaction prior to leaving the exam room. VIS sheet given to patient.

## 2018-03-06 ENCOUNTER — Ambulatory Visit (INDEPENDENT_AMBULATORY_CARE_PROVIDER_SITE_OTHER): Payer: PPO | Admitting: Family Medicine

## 2018-03-06 ENCOUNTER — Encounter: Payer: Self-pay | Admitting: Family Medicine

## 2018-03-06 VITALS — BP 130/90 | HR 58 | Temp 97.5°F | Ht 64.0 in | Wt 170.6 lb

## 2018-03-06 DIAGNOSIS — I10 Essential (primary) hypertension: Secondary | ICD-10-CM | POA: Diagnosis not present

## 2018-03-06 LAB — BASIC METABOLIC PANEL
BUN: 14 mg/dL (ref 6–23)
CO2: 33 mEq/L — ABNORMAL HIGH (ref 19–32)
Calcium: 9.8 mg/dL (ref 8.4–10.5)
Chloride: 100 mEq/L (ref 96–112)
Creatinine, Ser: 0.78 mg/dL (ref 0.40–1.20)
GFR: 78.15 mL/min (ref >60.00)
Glucose, Bld: 111 mg/dL — ABNORMAL HIGH (ref 70–99)
Potassium: 4.1 mEq/L (ref 3.5–5.1)
Sodium: 140 mEq/L (ref 135–145)

## 2018-03-06 NOTE — Patient Instructions (Signed)
-Continue current medication Hypertension Hypertension is another name for high blood pressure. High blood pressure forces your heart to work harder to pump blood. This can cause problems over time. There are two numbers in a blood pressure reading. There is a top number (systolic) over a bottom number (diastolic). It is best to have a blood pressure below 120/80. Healthy choices can help lower your blood pressure. You may need medicine to help lower your blood pressure if:  Your blood pressure cannot be lowered with healthy choices.  Your blood pressure is higher than 130/80.  Follow these instructions at home: Eating and drinking  If directed, follow the DASH eating plan. This diet includes: ? Filling half of your plate at each meal with fruits and vegetables. ? Filling one quarter of your plate at each meal with whole grains. Whole grains include whole wheat pasta, brown rice, and whole grain bread. ? Eating or drinking low-fat dairy products, such as skim milk or low-fat yogurt. ? Filling one quarter of your plate at each meal with low-fat (lean) proteins. Low-fat proteins include fish, skinless chicken, eggs, beans, and tofu. ? Avoiding fatty meat, cured and processed meat, or chicken with skin. ? Avoiding premade or processed food.  Eat less than 1,500 mg of salt (sodium) a day.  Limit alcohol use to no more than 1 drink a day for nonpregnant women and 2 drinks a day for men. One drink equals 12 oz of beer, 5 oz of wine, or 1 oz of hard liquor. Lifestyle  Work with your doctor to stay at a healthy weight or to lose weight. Ask your doctor what the best weight is for you.  Get at least 30 minutes of exercise that causes your heart to beat faster (aerobic exercise) most days of the week. This may include walking, swimming, or biking.  Get at least 30 minutes of exercise that strengthens your muscles (resistance exercise) at least 3 days a week. This may include lifting weights or  pilates.  Do not use any products that contain nicotine or tobacco. This includes cigarettes and e-cigarettes. If you need help quitting, ask your doctor.  Check your blood pressure at home as told by your doctor.  Keep all follow-up visits as told by your doctor. This is important. Medicines  Take over-the-counter and prescription medicines only as told by your doctor. Follow directions carefully.  Do not skip doses of blood pressure medicine. The medicine does not work as well if you skip doses. Skipping doses also puts you at risk for problems.  Ask your doctor about side effects or reactions to medicines that you should watch for. Contact a doctor if:  You think you are having a reaction to the medicine you are taking.  You have headaches that keep coming back (recurring).  You feel dizzy.  You have swelling in your ankles.  You have trouble with your vision. Get help right away if:  You get a very bad headache.  You start to feel confused.  You feel weak or numb.  You feel faint.  You get very bad pain in your: ? Chest. ? Belly (abdomen).  You throw up (vomit) more than once.  You have trouble breathing. Summary  Hypertension is another name for high blood pressure.  Making healthy choices can help lower blood pressure. If your blood pressure cannot be controlled with healthy choices, you may need to take medicine. This information is not intended to replace advice given to you by  your health care provider. Make sure you discuss any questions you have with your health care provider. Document Released: 10/27/2007 Document Revised: 04/07/2016 Document Reviewed: 04/07/2016 Elsevier Interactive Patient Education  [employer].  -See me again in about 6 months.

## 2018-03-06 NOTE — Assessment & Plan Note (Signed)
-  BP is improved -She will continue atacant hct -Follow low salt diet -Update bmp today -f/u in 6 months or sooner if needed.

## 2018-03-06 NOTE — Progress Notes (Signed)
Megan Martinez - 68 y.o. female MRN [id number]  Date of birth: 1950/12/04  Subjective Chief Complaint  Patient presents with  . Hypertension    HPI Megan Martinez is a 67 y.o. female here today for follow up of HTN.  She is currently treated with atacand hct reports she is doing well with this.  She is monitoring her BP at home and it is well controlled at home, typically around 120-125/70-75.  She denies side effects including cough and/or symptoms of hypotension.  She is without chest pain,shortness of breath, palpitations or headache.   ROS:  A comprehensive ROS was completed and negative except as noted per HPI  Allergies  Allergen Reactions  . Latex Itching and Rash  . Statins Other (See Comments)    Myalgias, "sick"   . Hydrocodone-Homatropine   . Penicillin G   . Amoxicillin Rash  . Codeine Nausea Only    Past Medical History:  Diagnosis Date  . Allergy   . Arthritis   . Hypertension   . Thyroid disease     Past Surgical History:  Procedure Laterality Date  . ABDOMINAL HYSTERECTOMY    . APPENDECTOMY    . CATARACT EXTRACTION    . CHOLECYSTECTOMY    . TUBAL LIGATION      Social History   Socioeconomic History  . Marital status: Married    Spouse name: Not on file  . Number of children: Not on file  . Years of education: Not on file  . Highest education level: Not on file  Occupational History  . Not on file  Social Needs  . Financial resource strain: Not on file  . Food insecurity:    Worry: Not on file    Inability: Not on file  . Transportation needs:    Medical: Not on file    Non-medical: Not on file  Tobacco Use  . Smoking status: Former Smoker    Last attempt to quit: 1970    Years since quitting: 49.8  . Smokeless tobacco: Never Used  Substance and Sexual Activity  . Alcohol use: Yes    Comment: occass.   . Drug use: Never  . Sexual activity: Not on file  Lifestyle  . Physical activity:    Days per week: Not on file    Minutes  per session: Not on file  . Stress: Not on file  Relationships  . Social connections:    Talks on phone: Not on file    Gets together: Not on file    Attends religious service: Not on file    Active member of club or organization: Not on file    Attends meetings of clubs or organizations: Not on file    Relationship status: Not on file  Other Topics Concern  . Not on file  Social History Narrative  . Not on file    No family history on file.  Health Maintenance  Topic Date Due  . Hepatitis C Screening  1950-05-29  . TETANUS/TDAP  07/29/1969  . MAMMOGRAM  07/29/2000  . COLONOSCOPY  07/29/2000  . DEXA SCAN  07/30/2015  . INFLUENZA VACCINE  Completed  . PNA vac Low Risk Adult  Completed    ----------------------------------------------------------------------------------------------------------------------------------------------------------------------------------------------------------------- Physical Exam BP 130/90   Pulse (!) 58   Temp (!) 97.5 F (36.4 C)   Ht 5\' 4"  (1.626 m)   Wt 170 lb 9.6 oz (77.4 kg)   SpO2 96%   BMI 29.28 kg/m   Physical Exam  Constitutional: She is oriented to person, place, and time. She appears well-nourished. No distress.  HENT:  Head: Normocephalic and atraumatic.  Mouth/Throat: Oropharynx is clear and moist.  Eyes: No scleral icterus.  Neck: Neck supple. No thyromegaly present.  Cardiovascular: Normal rate, regular rhythm and normal heart sounds.  Pulmonary/Chest: Effort normal and breath sounds normal.  Musculoskeletal: She exhibits no edema.  Lymphadenopathy:    She has no cervical adenopathy.  Neurological: She is alert and oriented to person, place, and time.  Psychiatric: She has a normal mood and affect. Her behavior is normal.     ------------------------------------------------------------------------------------------------------------------------------------------------------------------------------------------------------------------- Assessment and Plan  Essential hypertension -BP is improved -She will continue atacant hct -Follow low salt diet -Update bmp today -f/u in 6 months or sooner if needed.

## 2018-03-08 ENCOUNTER — Ambulatory Visit: Payer: PPO | Admitting: Family Medicine

## 2018-03-13 ENCOUNTER — Telehealth: Payer: Self-pay | Admitting: Family Medicine

## 2018-03-13 NOTE — Telephone Encounter (Signed)
Copied from [place] [id number]. Topic: Quick Communication - Lab Results (Clinic Use ONLY) >> Mar 08, 2018  9:47 AM Milford Cage, RMA wrote: Called patient to inform them of  lab results. When patient returns call, triage nurse may disclose results.   Pt returned call about labs.Please advise

## 2018-03-13 NOTE — Telephone Encounter (Signed)
Copied from [place] [phone]. Topic: Referral - Request for Referral >> Mar 13, 2018 12:01 PM Parke Poisson wrote: Has patient seen PCP for this complaint yes *If NO, is insurance requiring patient see PCP for this issue before PCP can refer them? Referral for which specialty:  Preferred provider/office:  Reason for referral: knot on top of left foot

## 2018-03-13 NOTE — Telephone Encounter (Signed)
Lab results given and documented in result note 

## 2018-04-03 ENCOUNTER — Other Ambulatory Visit: Payer: Self-pay | Admitting: Family Medicine

## 2018-04-03 NOTE — Telephone Encounter (Signed)
Requested medication (s) are due for refill today: yes  Requested medication (s) are on the active medication list: yes  Last refill:  03/03/16  Future visit scheduled: yes, NOV 09/06/17  Notes to clinic:  Med prescribed by historical provider. Last refill noted to be 03/03/16. LOV 03/06/18  Requested Prescriptions  Pending Prescriptions Disp Refills   levothyroxine (SYNTHROID, LEVOTHROID) 137 MCG tablet       Endocrinology:  Hypothyroid Agents Failed - 04/03/2018 11:44 AM      Failed - TSH needs to be rechecked within 3 months after an abnormal result. Refill until TSH is due.      Passed - TSH in normal range and within 360 days    TSH  Date Value Ref Range Status  01/25/2018 1.67 0.35 - 4.50 uIU/mL Final         Passed - Valid encounter within last 12 months    Recent Outpatient Visits          4 weeks ago Essential hypertension   [facility], [place], DO   1 month ago Essential hypertension   [facility], [place], DO   2 months ago Essential hypertension   [facility] [place], [place], DO      Future Appointments            In 5 months [name], DO [facility]

## 2018-04-03 NOTE — Telephone Encounter (Signed)
Copied from [place] [id number]. Topic: Quick Communication - Rx Refill/Question >> Apr 03, 2018 11:03 AM Virl Axe D wrote: Medication: levothyroxine (SYNTHROID, LEVOTHROID) 137 MCG tablet   Has the patient contacted their pharmacy? Yes.   (Agent: If no, request that the patient contact the pharmacy for the refill.) (Agent: If yes, when and what did the pharmacy advise?)  Preferred Pharmacy (with phone number or street name): [id number], [place] [phone] (Phone) [phone] (Fax)    Agent: Please be advised that RX refills may take up to 3 business days. We ask that you follow-up with your pharmacy.

## 2018-04-04 MED ORDER — LEVOTHYROXINE SODIUM 137 MCG PO TABS: ORAL_TABLET | ORAL | 3 refills | Status: DC

## 2018-04-11 ENCOUNTER — Other Ambulatory Visit: Payer: Self-pay | Admitting: General Surgery

## 2018-04-11 DIAGNOSIS — D242 Benign neoplasm of left breast: Secondary | ICD-10-CM

## 2018-05-04 ENCOUNTER — Ambulatory Visit
Admission: RE | Admit: 2018-05-04 | Discharge: 2018-05-04 | Disposition: A | Discharge status: Home / Self Care | Payer: PPO | Source: Ambulatory Visit | Attending: General Surgery | Admitting: General Surgery

## 2018-05-04 ENCOUNTER — Ambulatory Visit: Payer: PPO

## 2018-05-04 DIAGNOSIS — R928 Other abnormal and inconclusive findings on diagnostic imaging of breast: Secondary | ICD-10-CM | POA: Diagnosis not present

## 2018-05-04 DIAGNOSIS — D242 Benign neoplasm of left breast: Secondary | ICD-10-CM

## 2018-06-23 ENCOUNTER — Encounter: Payer: Self-pay | Admitting: Family Medicine

## 2018-06-23 ENCOUNTER — Ambulatory Visit (INDEPENDENT_AMBULATORY_CARE_PROVIDER_SITE_OTHER): Payer: PPO | Admitting: Family Medicine

## 2018-06-23 DIAGNOSIS — J0100 Acute maxillary sinusitis, unspecified: Secondary | ICD-10-CM

## 2018-06-23 MED ORDER — DOXYCYCLINE HYCLATE 100 MG PO TABS: 100.0000 mg | ORAL_TABLET | Freq: Two times a day (BID) | ORAL | 0 refills | Status: DC

## 2018-06-23 MED ORDER — HYDROCOD POLST-CPM POLST ER 10-8 MG/5ML PO SUER: 5.0000 mL | Freq: Two times a day (BID) | ORAL | 0 refills | Status: DC | PRN

## 2018-06-23 NOTE — Patient Instructions (Signed)

## 2018-06-23 NOTE — Progress Notes (Signed)
Megan Martinez - 67 y.o. female MRN [id number]  Date of birth: Sep 24, 1950  Subjective Chief Complaint  Patient presents with  . Cough    HPI Megan Martinez is a 68 y.o. female who complains of coryza, congestion, sore throat, nasal blockage, post nasal drip, dry cough, headache and sinus pain.  Symptoms initially started about 6 weeks ago.  Seemed to be improving and then worsening then improved now has significantly worsened.  She is having some chills, unsure if she has fever along with thick yellow nasal drainage with blood mixed in.  She is staying well hydrated.  She has had limited relief from OTC medications.  Her cough is keeping her from sleeping.  She denies a history of chest pain, fatigue, nausea, shortness of breath, vomiting and wheezing and denies a history of asthma. Patient does not smoke cigarettes.   ROS:  A comprehensive ROS was completed and negative except as noted per HPI  Allergies  Allergen Reactions  . Latex Itching and Rash  . Statins Other (See Comments)    Myalgias, "sick"   . Penicillin G   . Amoxicillin Rash  . Codeine Nausea Only    Past Medical History:  Diagnosis Date  . Allergy   . Arthritis   . Hypertension   . Thyroid disease     Past Surgical History:  Procedure Laterality Date  . ABDOMINAL HYSTERECTOMY    . APPENDECTOMY    . CATARACT EXTRACTION    . CHOLECYSTECTOMY    . TUBAL LIGATION      Social History   Socioeconomic History  . Marital status: Married    Spouse name: Not on file  . Number of children: Not on file  . Years of education: Not on file  . Highest education level: Not on file  Occupational History  . Not on file  Social Needs  . Financial resource strain: Not on file  . Food insecurity:    Worry: Not on file    Inability: Not on file  . Transportation needs:    Medical: Not on file    Non-medical: Not on file  Tobacco Use  . Smoking status: Former Smoker    Last attempt to quit: 1970    Years since  quitting: 50.1  . Smokeless tobacco: Never Used  Substance and Sexual Activity  . Alcohol use: Yes    Comment: occass.   . Drug use: Never  . Sexual activity: Not on file  Lifestyle  . Physical activity:    Days per week: Not on file    Minutes per session: Not on file  . Stress: Not on file  Relationships  . Social connections:    Talks on phone: Not on file    Gets together: Not on file    Attends religious service: Not on file    Active member of club or organization: Not on file    Attends meetings of clubs or organizations: Not on file    Relationship status: Not on file  Other Topics Concern  . Not on file  Social History Narrative  . Not on file    History reviewed. No pertinent family history.  Health Maintenance  Topic Date Due  . Hepatitis C Screening  1950/08/09  . TETANUS/TDAP  07/29/1969  . MAMMOGRAM  07/29/2000  . COLONOSCOPY  07/29/2000  . DEXA SCAN  07/30/2015  . INFLUENZA VACCINE  Completed  . PNA vac Low Risk Adult  Completed    -----------------------------------------------------------------------------------------------------------------------------------------------------------------------------------------------------------------  Physical Exam BP 128/80   Pulse 60   Temp (!) 97.4 F (36.3 C) (Oral)   Ht 5\' 4"  (1.626 m)   Wt 169 lb 6 oz (76.8 kg)   SpO2 98%   BMI 29.07 kg/m   Physical Exam Constitutional:      Appearance: She is ill-appearing. She is not toxic-appearing.  HENT:     Head: Normocephalic and atraumatic.     Right Ear: Tympanic membrane normal.     Left Ear: Tympanic membrane normal.     Nose: Congestion present.     Comments: Bilateral maxillary sinus tenderness     Mouth/Throat:     Mouth: Mucous membranes are moist.  Eyes:     General: No scleral icterus. Neck:     Musculoskeletal: Normal range of motion and neck supple.  Cardiovascular:     Rate and Rhythm: Normal rate and regular rhythm.  Pulmonary:      Effort: Pulmonary effort is normal.     Breath sounds: Normal breath sounds.  Skin:    General: Skin is warm and dry.     Findings: No rash.  Neurological:     General: No focal deficit present.     Mental Status: She is alert.  Psychiatric:        Mood and Affect: Mood normal.        Behavior: Behavior normal.     ------------------------------------------------------------------------------------------------------------------------------------------------------------------------------------------------------------------- Assessment and Plan  Acute maxillary sinusitis -PCN allergy noted, will treat with doxycycline.  -Rx for tussionex.  Reports allergy to codeine but states she has tolerated hydrocodone in the past.  -Continue supportive treatment, push fluids, rest.

## 2018-06-23 NOTE — Assessment & Plan Note (Signed)
-  PCN allergy noted, will treat with doxycycline.  -Rx for tussionex.  Reports allergy to codeine but states she has tolerated hydrocodone in the past.  -Continue supportive treatment, push fluids, rest.

## 2018-08-05 ENCOUNTER — Other Ambulatory Visit: Payer: Self-pay | Admitting: Family Medicine

## 2018-08-09 ENCOUNTER — Other Ambulatory Visit: Payer: Self-pay

## 2018-08-09 ENCOUNTER — Encounter: Payer: Self-pay | Admitting: Family Medicine

## 2018-08-09 ENCOUNTER — Ambulatory Visit (INDEPENDENT_AMBULATORY_CARE_PROVIDER_SITE_OTHER): Payer: PPO | Admitting: Family Medicine

## 2018-08-09 DIAGNOSIS — J302 Other seasonal allergic rhinitis: Secondary | ICD-10-CM | POA: Diagnosis not present

## 2018-08-09 MED ORDER — FLUTICASONE PROPIONATE 50 MCG/ACT NA SUSP: 2.0000 | Freq: Every day | NASAL | 6 refills | Status: AC

## 2018-08-09 MED ORDER — LEVOCETIRIZINE DIHYDROCHLORIDE 5 MG PO TABS: 5.0000 mg | ORAL_TABLET | Freq: Every evening | ORAL | 0 refills | Status: DC

## 2018-08-09 NOTE — Progress Notes (Signed)
Megan Martinez - 68 y.o. female MRN [id number]  Date of birth: [birth year]  Subjective Chief Complaint  Patient presents with  . Allergies    itchy eyes, runny nose, no fever, no sick contacts    HPI Megan Martinez is a 68 y.o. female here today for acute visit with complaint of scratchy throat, itchy eyes, mild cough and congestion.  Her symptoms started a few days ago.  She has a history of seasonal allergies and this feels similar.  She denies chest pain, shortness of breath, fever, chills, sinus pain.  She had some leftover xyzal but it expired in 2016.    ROS:  A comprehensive ROS was completed and negative except as noted per HPI  Allergies  Allergen Reactions  . Latex Itching and Rash  . Statins Other (See Comments)    Myalgias, "sick"   . Penicillin G   . Amoxicillin Rash  . Codeine Nausea Only    Past Medical History:  Diagnosis Date  . Allergy   . Arthritis   . Hypertension   . Thyroid disease     Past Surgical History:  Procedure Laterality Date  . ABDOMINAL HYSTERECTOMY    . APPENDECTOMY    . CATARACT EXTRACTION    . CHOLECYSTECTOMY    . TUBAL LIGATION      Social History   Socioeconomic History  . Marital status: Married    Spouse name: Not on file  . Number of children: Not on file  . Years of education: Not on file  . Highest education level: Not on file  Occupational History  . Not on file  Social Needs  . Financial resource strain: Not on file  . Food insecurity:    Worry: Not on file    Inability: Not on file  . Transportation needs:    Medical: Not on file    Non-medical: Not on file  Tobacco Use  . Smoking status: Former Smoker    Last attempt to quit: 1970    Years since quitting: 50.2  . Smokeless tobacco: Never Used  Substance and Sexual Activity  . Alcohol use: Yes    Comment: occass.   . Drug use: Never  . Sexual activity: Not on file  Lifestyle  . Physical activity:    Days per week: Not on file    Minutes per  session: Not on file  . Stress: Not on file  Relationships  . Social connections:    Talks on phone: Not on file    Gets together: Not on file    Attends religious service: Not on file    Active member of club or organization: Not on file    Attends meetings of clubs or organizations: Not on file    Relationship status: Not on file  Other Topics Concern  . Not on file  Social History Narrative  . Not on file    No family history on file.  Health Maintenance  Topic Date Due  . Hepatitis C Screening  December 06, 1950  . TETANUS/TDAP  07/29/1969  . MAMMOGRAM  07/29/2000  . COLONOSCOPY  07/29/2000  . DEXA SCAN  07/30/2015  . INFLUENZA VACCINE  Completed  . PNA vac Low Risk Adult  Completed    ----------------------------------------------------------------------------------------------------------------------------------------------------------------------------------------------------------------- Physical Exam BP (!) 160/82   Pulse 64   Temp 97.8 F (36.6 C) (Oral)   Ht 5\' 4"  (1.626 m)   Wt 170 lb 9.6 oz (77.4 kg)   SpO2 97%   BMI  29.28 kg/m   Physical Exam Constitutional:      Appearance: Normal appearance.  HENT:     Head: Normocephalic and atraumatic.     Nose: Congestion present.     Mouth/Throat:     Mouth: Mucous membranes are moist.  Eyes:     General: No scleral icterus. Neck:     Musculoskeletal: Neck supple.  Cardiovascular:     Rate and Rhythm: Normal rate and regular rhythm.  Pulmonary:     Effort: Pulmonary effort is normal.     Breath sounds: Normal breath sounds.  Lymphadenopathy:     Cervical: No cervical adenopathy.  Skin:    Findings: No rash.  Neurological:     General: No focal deficit present.     Mental Status: She is alert.  Psychiatric:        Mood and Affect: Mood normal.        Behavior: Behavior normal.      ------------------------------------------------------------------------------------------------------------------------------------------------------------------------------------------------------------------- Assessment and Plan  Seasonal allergies -Restart xyzal -Start flonase -Discussed allergen avoidance -F/u if not improving.

## 2018-08-09 NOTE — Assessment & Plan Note (Signed)
-  Restart xyzal -Start flonase -Discussed allergen avoidance -F/u if not improving.

## 2018-08-09 NOTE — Patient Instructions (Signed)

## 2018-09-07 ENCOUNTER — Encounter: Payer: Self-pay | Admitting: Family Medicine

## 2018-09-07 ENCOUNTER — Ambulatory Visit (INDEPENDENT_AMBULATORY_CARE_PROVIDER_SITE_OTHER): Payer: PPO | Admitting: Family Medicine

## 2018-09-07 ENCOUNTER — Ambulatory Visit: Payer: PPO | Admitting: Family Medicine

## 2018-09-07 DIAGNOSIS — I10 Essential (primary) hypertension: Secondary | ICD-10-CM | POA: Diagnosis not present

## 2018-09-07 DIAGNOSIS — J302 Other seasonal allergic rhinitis: Secondary | ICD-10-CM | POA: Diagnosis not present

## 2018-09-07 MED ORDER — BENZONATATE 100 MG PO CAPS: 100.0000 mg | ORAL_CAPSULE | Freq: Three times a day (TID) | ORAL | 0 refills | Status: DC | PRN

## 2018-09-07 NOTE — Assessment & Plan Note (Signed)
-  BP stable -Continue current medication.  -F/u in 3-4 months

## 2018-09-07 NOTE — Progress Notes (Signed)
Megan Martinez - 68 y.o. female MRN [id number]  Date of birth: 12-10-1950   This visit type was conducted due to national recommendations for restrictions regarding the COVID-19 Pandemic (e.g. social distancing).  This format is felt to be most appropriate for this patient at this time.  All issues noted in this document were discussed and addressed.  No physical exam was performed (except for noted visual exam findings with Video Visits).  I discussed the limitations of evaluation and management by telemedicine and the availability of in person appointments. The patient expressed understanding and agreed to proceed.  I connected with@ on 09/07/18 at 10:00 AM EDT by a video enabled telemedicine application and verified that I am speaking with the correct person using two identifiers.   Patient Location: Home  [address] [place] [address]   Provider location:   Home office  Chief Complaint  Patient presents with  . Follow-up    6 mo F/U HTN     HPI  Megan Martinez is a 68 y.o. female who presents via audio/video conferencing for a telehealth visit today.  She is following up today for hypertension.  She reports she is doing well.  BP in the 130-140/80's range.  She denies symptoms of hypotension. She has not had chest pain, shortness of breath, headache, palpitations.   She does continue to have intermittent cough, recently worsened by allergies. She is taking xyzal and flonase and has noticed improvement with these.  She has had trouble finding a cough medication OTC that is compatible with HTN.     ROS:  A comprehensive ROS was completed and negative except as noted per HPI  Past Medical History:  Diagnosis Date  . Allergy   . Arthritis   . Hypertension   . Thyroid disease     Past Surgical History:  Procedure Laterality Date  . ABDOMINAL HYSTERECTOMY    . APPENDECTOMY    . CATARACT EXTRACTION    . CHOLECYSTECTOMY    . TUBAL LIGATION      History reviewed. No  pertinent family history.  Social History   Socioeconomic History  . Marital status: Married    Spouse name: Not on file  . Number of children: Not on file  . Years of education: Not on file  . Highest education level: Not on file  Occupational History  . Not on file  Social Needs  . Financial resource strain: Not on file  . Food insecurity:    Worry: Not on file    Inability: Not on file  . Transportation needs:    Medical: Not on file    Non-medical: Not on file  Tobacco Use  . Smoking status: Former Smoker    Last attempt to quit: 1970    Years since quitting: 50.3  . Smokeless tobacco: Never Used  Substance and Sexual Activity  . Alcohol use: Yes    Comment: occass.   . Drug use: Never  . Sexual activity: Not on file  Lifestyle  . Physical activity:    Days per week: Not on file    Minutes per session: Not on file  . Stress: Not on file  Relationships  . Social connections:    Talks on phone: Not on file    Gets together: Not on file    Attends religious service: Not on file    Active member of club or organization: Not on file    Attends meetings of clubs or organizations: Not  on file    Relationship status: Not on file  . Intimate partner violence:    Fear of current or ex partner: Not on file    Emotionally abused: Not on file    Physically abused: Not on file    Forced sexual activity: Not on file  Other Topics Concern  . Not on file  Social History Narrative  . Not on file     Current Outpatient Medications:  .  amLODipine (NORVASC) 10 MG tablet, Take 1 tablet by mouth daily., Disp: , Rfl:  .  aspirin 81 MG tablet, Take 81 mg by mouth daily., Disp: , Rfl:  .  Candesartan Cilexetil-HCTZ 32-25 MG TABS, Take 1 tablet by mouth daily., Disp: 90 each, Rfl: 1 .  fluticasone (FLONASE) 50 MCG/ACT nasal spray, Place 2 sprays into both nostrils daily., Disp: 16 g, Rfl: 6 .  levocetirizine (XYZAL) 5 MG tablet, Take 1 tablet (5 mg total) by mouth every  evening., Disp: 90 tablet, Rfl: 0 .  levothyroxine (SYNTHROID, LEVOTHROID) 137 MCG tablet, TAKE 1 TABLET BY MOUTH DAILY, Disp: 30 tablet, Rfl: 3 .  potassium chloride SA (K-DUR,KLOR-CON) 20 MEQ tablet, potassium chloride ER 20 mEq tablet,extended release(part/cryst), Disp: , Rfl:  .  UNABLE TO FIND, 750 mg daily. Med Name: CuraMed, Disp: , Rfl:   EXAM:  VITALS per patient if applicable: BP 315/40 Comment: Patient reported  Pulse (!) 56 Comment: Patient reported  Temp 97.9 F (36.6 C) (Oral) Comment (Src): patient reported  GENERAL: alert, oriented, appears well and in no acute distress  HEENT: atraumatic, conjunttiva clear, no obvious abnormalities on inspection of external nose and ears  NECK: normal movements of the head and neck  LUNGS: on inspection no signs of respiratory distress, breathing rate appears normal, no obvious gross SOB, gasping or wheezing  CV: no obvious cyanosis  MS: moves all visible extremities without noticeable abnormality  PSYCH/NEURO: pleasant and cooperative, no obvious depression or anxiety, speech and thought processing grossly intact  ASSESSMENT AND PLAN:  Discussed the following assessment and plan:  Seasonal allergies -Continues with cough -Add tessalon -She'll continue flonase and xyzal   Essential hypertension -BP stable -Continue current medication.  -F/u in 3-4 months        I discussed the assessment and treatment plan with the patient. The patient was provided an opportunity to ask questions and all were answered. The patient agreed with the plan and demonstrated an understanding of the instructions.   The patient was advised to call back or seek an in-person evaluation if the symptoms worsen or if the condition fails to improve as anticipated.     Megan Nutting, DO

## 2018-09-07 NOTE — Assessment & Plan Note (Signed)
-  Continues with cough -Add tessalon -She'll continue flonase and xyzal

## 2018-09-15 ENCOUNTER — Other Ambulatory Visit: Payer: Self-pay | Admitting: Family Medicine

## 2018-10-18 ENCOUNTER — Ambulatory Visit (INDEPENDENT_AMBULATORY_CARE_PROVIDER_SITE_OTHER): Payer: PPO | Admitting: Family Medicine

## 2018-10-18 ENCOUNTER — Encounter: Payer: Self-pay | Admitting: Family Medicine

## 2018-10-18 DIAGNOSIS — L209 Atopic dermatitis, unspecified: Secondary | ICD-10-CM

## 2018-10-18 MED ORDER — TRIAMCINOLONE ACETONIDE 0.1 % EX CREA: 1.0000 "application " | TOPICAL_CREAM | Freq: Two times a day (BID) | CUTANEOUS | 0 refills | Status: DC | PRN

## 2018-10-18 NOTE — Assessment & Plan Note (Signed)
-  Atopic appearance of rash also has seasonal allergies.  -Start triamcinolone cream bid to area.  -Call if not improving or if symptoms appear to be worsening.

## 2018-10-18 NOTE — Progress Notes (Signed)
Megan Martinez - 68 y.o. female MRN [id number]  Date of birth: May 26, 1950  Subjective Chief Complaint  Patient presents with  . Rash    Onset : 2 wks , blisters, rasied, was angry,scabbed over pink now, Locations : Lcheek,L shoulder,r ribs/groin back of neck hairline    HPI Megan Martinez is a 68 y.o. female here today with complaint of rash.  Reports that rash started about 1.5-2 weeks ago.  Areas were raised, red and itchy.  Improved some today but still red and a bit scaly in appearance.  Rash located in L AC fossa and shoulder, R rib cage just below breast area and R groin.  She tried several OTC treatments including benadryl cream, calamine, and cortisone without improvement.  She denies any new soaps, lotions, foods, or detergents.  She has had this in the past.    ROS:  A comprehensive ROS was completed and negative except as noted per HPI  Allergies  Allergen Reactions  . Latex Itching and Rash  . Statins Other (See Comments)    Myalgias, "sick"   . Penicillin G   . Amoxicillin Rash  . Codeine Nausea Only    Past Medical History:  Diagnosis Date  . Allergy   . Arthritis   . Hypertension   . Thyroid disease     Past Surgical History:  Procedure Laterality Date  . ABDOMINAL HYSTERECTOMY    . APPENDECTOMY    . CATARACT EXTRACTION    . CHOLECYSTECTOMY    . TUBAL LIGATION      Social History   Socioeconomic History  . Marital status: Married    Spouse name: Not on file  . Number of children: Not on file  . Years of education: Not on file  . Highest education level: Not on file  Occupational History  . Not on file  Social Needs  . Financial resource strain: Not on file  . Food insecurity:    Worry: Not on file    Inability: Not on file  . Transportation needs:    Medical: Not on file    Non-medical: Not on file  Tobacco Use  . Smoking status: Former Smoker    Last attempt to quit: 1970    Years since quitting: 50.4  . Smokeless tobacco: Never Used   Substance and Sexual Activity  . Alcohol use: Yes    Comment: occass.   . Drug use: Never  . Sexual activity: Not on file  Lifestyle  . Physical activity:    Days per week: Not on file    Minutes per session: Not on file  . Stress: Not on file  Relationships  . Social connections:    Talks on phone: Not on file    Gets together: Not on file    Attends religious service: Not on file    Active member of club or organization: Not on file    Attends meetings of clubs or organizations: Not on file    Relationship status: Not on file  Other Topics Concern  . Not on file  Social History Narrative  . Not on file    No family history on file.  Health Maintenance  Topic Date Due  . Hepatitis C Screening  08-30-1950  . TETANUS/TDAP  07/29/1969  . MAMMOGRAM  07/29/2000  . COLONOSCOPY  07/29/2000  . DEXA SCAN  07/30/2015  . INFLUENZA VACCINE  12/23/2018  . PNA vac Low Risk Adult  Completed    ----------------------------------------------------------------------------------------------------------------------------------------------------------------------------------------------------------------- Physical  Exam BP 132/86   Pulse 71   Temp 97.7 F (36.5 C) (Oral)   Resp 18   Ht 5\' 4"  (1.626 m)   Wt 171 lb 3.2 oz (77.7 kg)   SpO2 96%   BMI 29.39 kg/m   Physical Exam Constitutional:      Appearance: Normal appearance.  HENT:     Head: Normocephalic and atraumatic.  Eyes:     General: No scleral icterus. Cardiovascular:     Rate and Rhythm: Normal rate and regular rhythm.  Pulmonary:     Effort: Pulmonary effort is normal.     Breath sounds: Normal breath sounds.  Skin:    Comments: Scattered scaly patches with erythematous base located in L AC fossa, L shoulder, Right lower lateral chest wall. No blistering, crusting or drainage noted.   Neurological:     General: No focal deficit present.     Mental Status: She is alert.  Psychiatric:        Mood and Affect:  Mood normal.        Behavior: Behavior normal.     ------------------------------------------------------------------------------------------------------------------------------------------------------------------------------------------------------------------- Assessment and Plan  Atopic dermatitis -Atopic appearance of rash also has seasonal allergies.  -Start triamcinolone cream bid to area.  -Call if not improving or if symptoms appear to be worsening.

## 2018-10-18 NOTE — Patient Instructions (Signed)

## 2018-11-17 ENCOUNTER — Other Ambulatory Visit: Payer: Self-pay | Admitting: Family Medicine

## 2018-12-19 ENCOUNTER — Other Ambulatory Visit: Payer: Self-pay | Admitting: Family Medicine

## 2019-01-03 ENCOUNTER — Ambulatory Visit (INDEPENDENT_AMBULATORY_CARE_PROVIDER_SITE_OTHER): Payer: PPO | Admitting: Family Medicine

## 2019-01-03 ENCOUNTER — Other Ambulatory Visit: Payer: Self-pay

## 2019-01-03 ENCOUNTER — Encounter: Payer: Self-pay | Admitting: Family Medicine

## 2019-01-03 ENCOUNTER — Ambulatory Visit (INDEPENDENT_AMBULATORY_CARE_PROVIDER_SITE_OTHER): Payer: PPO

## 2019-01-03 VITALS — BP 160/90 | HR 69 | Temp 97.8°F | Ht 64.0 in | Wt 168.4 lb

## 2019-01-03 DIAGNOSIS — R2242 Localized swelling, mass and lump, left lower limb: Secondary | ICD-10-CM

## 2019-01-03 DIAGNOSIS — E039 Hypothyroidism, unspecified: Secondary | ICD-10-CM

## 2019-01-03 DIAGNOSIS — M25872 Other specified joint disorders, left ankle and foot: Secondary | ICD-10-CM | POA: Diagnosis not present

## 2019-01-03 DIAGNOSIS — E890 Postprocedural hypothyroidism: Secondary | ICD-10-CM

## 2019-01-03 DIAGNOSIS — I10 Essential (primary) hypertension: Secondary | ICD-10-CM | POA: Diagnosis not present

## 2019-01-03 LAB — CBC WITH DIFFERENTIAL/PLATELET
Basophils Absolute: 0.1 10*3/uL (ref 0.0–0.1)
Basophils Relative: 0.9 % (ref 0.0–3.0)
Eosinophils Absolute: 0.2 10*3/uL (ref 0.0–0.7)
Eosinophils Relative: 3.1 % (ref 0.0–5.0)
HCT: 41.2 % (ref 36.0–46.0)
Hemoglobin: 13.8 g/dL (ref 12.0–15.0)
Lymphocytes Relative: 28.2 % (ref 12.0–46.0)
Lymphs Abs: 2 10*3/uL (ref 0.7–4.0)
MCHC: 33.5 g/dL (ref 30.0–36.0)
MCV: 86.2 fl (ref 78.0–100.0)
Monocytes Absolute: 0.5 10*3/uL (ref 0.1–1.0)
Monocytes Relative: 7.4 % (ref 3.0–12.0)
Neutro Abs: 4.3 10*3/uL (ref 1.4–7.7)
Neutrophils Relative %: 60.4 % (ref 43.0–77.0)
Platelets: 281 10*3/uL (ref 150.0–400.0)
RBC: 4.78 Mil/uL (ref 3.87–5.11)
RDW: 13.6 % (ref 11.5–15.5)
WBC: 7.1 10*3/uL (ref 4.0–10.5)

## 2019-01-03 LAB — COMPREHENSIVE METABOLIC PANEL
ALT: 20 U/L (ref 0–35)
AST: 18 U/L (ref 0–37)
Albumin: 4.5 g/dL (ref 3.5–5.2)
Alkaline Phosphatase: 38 U/L — ABNORMAL LOW (ref 39–117)
BUN: 11 mg/dL (ref 6–23)
CO2: 30 mEq/L (ref 19–32)
Calcium: 9.8 mg/dL (ref 8.4–10.5)
Chloride: 99 mEq/L (ref 96–112)
Creatinine, Ser: 0.9 mg/dL (ref 0.40–1.20)
GFR: 62.19 mL/min (ref >60.00)
Glucose, Bld: 125 mg/dL — ABNORMAL HIGH (ref 70–99)
Potassium: 3.4 mEq/L — ABNORMAL LOW (ref 3.5–5.1)
Sodium: 139 mEq/L (ref 135–145)
Total Bilirubin: 0.3 mg/dL (ref 0.2–1.2)
Total Protein: 7.2 g/dL (ref 6.0–8.3)

## 2019-01-03 LAB — TSH: TSH: 2.37 u[IU]/mL (ref 0.35–4.50)

## 2019-01-03 NOTE — Assessment & Plan Note (Signed)
BP elevated today, well controlled at home Continue home monitoring.  Reviewed low sodium diet.

## 2019-01-03 NOTE — Patient Instructions (Signed)
Ganglion Cyst  A ganglion cyst is a non-cancerous, fluid-filled lump that occurs near a joint or tendon. The cyst grows out of a joint or the lining of a tendon. Ganglion cysts most often develop in the hand or wrist, but they can also develop in the shoulder, elbow, hip, knee, ankle, or foot. Ganglion cysts are ball-shaped or egg-shaped. Their size can range from the size of a pea to larger than a grape. Increased activity may cause the cyst to get bigger because more fluid starts to build up. What are the causes? The exact cause of this condition is not known, but it may be related to:  Inflammation or irritation around the joint.  An injury.  Repetitive movements or overuse.  Arthritis. What increases the risk? You are more likely to develop this condition if:  You are a woman.  You are 40-59 years old. What are the signs or symptoms? The main symptom of this condition is a lump. It most often appears on the hand or wrist. In many cases, there are no other symptoms, but a cyst can sometimes cause:  Tingling.  Pain.  Numbness.  Muscle weakness.  Weak grip.  Less range of motion in a joint. How is this diagnosed? Ganglion cysts are usually diagnosed based on a physical exam. Your health care provider will feel the lump and may shine a light next to it. If it is a ganglion cyst, the light will likely shine through it. Your health care provider may order an X-ray, ultrasound, or MRI to rule out other conditions. How is this treated? Ganglion cysts often go away on their own without treatment. If you have pain or other symptoms, treatment may be needed. Treatment is also needed if the ganglion cyst limits your movement or if it gets infected. Treatment may include:  Wearing a brace or splint on your wrist or finger.  Taking anti-inflammatory medicine.  Having fluid drained from the lump with a needle (aspiration).  Getting a steroid injected into the joint.  Having  surgery to remove the ganglion cyst.  Placing a pad on your shoe or wearing shoes that will not rub against the cyst if it is on your foot. Follow these instructions at home:  Do not press on the ganglion cyst, poke it with a needle, or hit it.  Take over-the-counter and prescription medicines only as told by your health care provider.  If you have a brace or splint: ? Wear it as told by your health care provider. ? Remove it as told by your health care provider. Ask if you need to remove it when you take a shower or a bath.  Watch your ganglion cyst for any changes.  Keep all follow-up visits as told by your health care provider. This is important. Contact a health care provider if:  Your ganglion cyst becomes larger or more painful.  You have pus coming from the lump.  You have weakness or numbness in the affected area.  You have a fever or chills. Get help right away if:  You have a fever and have any of these in the cyst area: ? Increased redness. ? Red streaks. ? Swelling. Summary  A ganglion cyst is a non-cancerous, fluid-filled lump that occurs near a joint or tendon.  Ganglion cysts most often develop in the hand or wrist, but they can also develop in the shoulder, elbow, hip, knee, ankle, or foot.  Ganglion cysts often go away on their own without treatment.  This information is not intended to replace advice given to you by your health care provider. Make sure you discuss any questions you have with your health care provider. Document Released: 05/07/2000 Document Revised: 04/22/2017 Document Reviewed: 01/07/2017 Elsevier Patient Education  2020 [name].

## 2019-01-03 NOTE — Assessment & Plan Note (Signed)
Area appears to be a ganglion cyst.  Will obtain xray to be sure it is not within bony structure of foot but feels more superficial.

## 2019-01-03 NOTE — Assessment & Plan Note (Signed)
Update TSH today. 

## 2019-01-03 NOTE — Progress Notes (Signed)
Megan Martinez - 68 y.o. female MRN [id number]  Date of birth: 03/24/1951  Subjective Chief Complaint  Patient presents with  . Cyst    knot on top of left foot getting bigger    HPI Megan Martinez is a 68 y.o. female with history of HTN and hypothyroidism here today for follow up of nodule on L foot.  She reports that this area is getting larger and making it more difficult for her to get her shoes on.  She denies significant pain around this area.    HTN:  Reports she is taking current BP medications.  Admits to some stress which may be why her BP is elevated today.  She does get good BP readings at home.  She denies any anginal symptoms, headache or shortness of breath.     ROS:  A comprehensive ROS was completed and negative except as noted per HPI  Allergies  Allergen Reactions  . Latex Itching and Rash  . Statins Other (See Comments)    Myalgias, "sick"   . Penicillin G   . Amoxicillin Rash  . Codeine Nausea Only    Past Medical History:  Diagnosis Date  . Allergy   . Arthritis   . Hypertension   . Thyroid disease     Past Surgical History:  Procedure Laterality Date  . ABDOMINAL HYSTERECTOMY    . APPENDECTOMY    . CATARACT EXTRACTION    . CHOLECYSTECTOMY    . TUBAL LIGATION      Social History   Socioeconomic History  . Marital status: Married    Spouse name: Not on file  . Number of children: Not on file  . Years of education: Not on file  . Highest education level: Not on file  Occupational History  . Not on file  Social Needs  . Financial resource strain: Not on file  . Food insecurity    Worry: Not on file    Inability: Not on file  . Transportation needs    Medical: Not on file    Non-medical: Not on file  Tobacco Use  . Smoking status: Former Smoker    Quit date: 1970    Years since quitting: 50.6  . Smokeless tobacco: Never Used  Substance and Sexual Activity  . Alcohol use: Yes    Comment: occass.   . Drug use: Never  . Sexual  activity: Not on file  Lifestyle  . Physical activity    Days per week: Not on file    Minutes per session: Not on file  . Stress: Not on file  Relationships  . Social Herbalist on phone: Not on file    Gets together: Not on file    Attends religious service: Not on file    Active member of club or organization: Not on file    Attends meetings of clubs or organizations: Not on file    Relationship status: Not on file  Other Topics Concern  . Not on file  Social History Narrative  . Not on file    No family history on file.  Health Maintenance  Topic Date Due  . Hepatitis C Screening  Aug 20, 1950  . TETANUS/TDAP  07/29/1969  . MAMMOGRAM  07/29/2000  . COLONOSCOPY  07/29/2000  . DEXA SCAN  07/30/2015  . INFLUENZA VACCINE  12/23/2018  . PNA vac Low Risk Adult  Completed    ----------------------------------------------------------------------------------------------------------------------------------------------------------------------------------------------------------------- Physical Exam BP (!) 160/90   Pulse 69  Temp 97.8 F (36.6 C) (Temporal)   Ht 5\' 4"  (1.626 m)   Wt 168 lb 6.4 oz (76.4 kg)   SpO2 98%   BMI 28.91 kg/m   Physical Exam Constitutional:      Appearance: Normal appearance.  HENT:     Head: Normocephalic and atraumatic.     Mouth/Throat:     Mouth: Mucous membranes are moist.  Eyes:     General: No scleral icterus. Cardiovascular:     Rate and Rhythm: Normal rate and regular rhythm.  Pulmonary:     Effort: Pulmonary effort is normal.  Skin:    General: Skin is warm and dry.     Findings: No rash.     Comments: Cystic lesion to dorsum of L foot.  Non-tender, firm.     Neurological:     General: No focal deficit present.     Mental Status: She is alert.  Psychiatric:        Mood and Affect: Mood normal.        Behavior: Behavior normal.      ------------------------------------------------------------------------------------------------------------------------------------------------------------------------------------------------------------------- Assessment and Plan  Subcutaneous nodule of left foot Area appears to be a ganglion cyst.  Will obtain xray to be sure it is not within bony structure of foot but feels more superficial.    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   Essential hypertension BP elevated today, well controlled at home Continue home monitoring.  Reviewed low sodium diet.

## 2019-01-05 ENCOUNTER — Other Ambulatory Visit: Payer: Self-pay | Admitting: Family Medicine

## 2019-01-05 DIAGNOSIS — M67472 Ganglion, left ankle and foot: Secondary | ICD-10-CM

## 2019-01-05 NOTE — Progress Notes (Signed)
Xray shows that area is soft tissue enlargement and not within the bone.  Suspect cyst, will refer to orthopedics to discuss excision of this.

## 2019-01-09 ENCOUNTER — Telehealth: Payer: Self-pay

## 2019-01-09 NOTE — Telephone Encounter (Signed)
Can we check on pt's ortho referral? Thanks!

## 2019-02-13 ENCOUNTER — Ambulatory Visit (INDEPENDENT_AMBULATORY_CARE_PROVIDER_SITE_OTHER): Payer: PPO

## 2019-02-13 ENCOUNTER — Other Ambulatory Visit: Payer: Self-pay

## 2019-02-13 DIAGNOSIS — Z23 Encounter for immunization: Secondary | ICD-10-CM

## 2019-02-13 NOTE — Progress Notes (Signed)
Pt came into the office to receive her flu vaccine. Vaccine given in the left deltoid. Pt tolerated injection well. No signs/symptoms of a reaction prior to pt leaving the office. VIS given to pt.

## 2019-02-19 ENCOUNTER — Other Ambulatory Visit: Payer: Self-pay

## 2019-02-19 MED ORDER — LEVOCETIRIZINE DIHYDROCHLORIDE 5 MG PO TABS: ORAL_TABLET | ORAL | 1 refills | Status: DC

## 2019-02-23 DIAGNOSIS — M79672 Pain in left foot: Secondary | ICD-10-CM | POA: Diagnosis not present

## 2019-02-23 DIAGNOSIS — M67472 Ganglion, left ankle and foot: Secondary | ICD-10-CM | POA: Diagnosis not present

## 2019-03-02 ENCOUNTER — Ambulatory Visit: Payer: Self-pay | Admitting: *Deleted

## 2019-03-02 NOTE — Telephone Encounter (Signed)
Pt called in c/o having left sided chest pain that radiated down her left arm with shortness of breath and nausea.   Her right jaw hurt all day yesterday.   Pain was a 9 on pain scale when all this happened last night at 7:30 PM.    No cardiac history.  I instructed her to go to the ED now even though she is not having chest pain this morning due to the symptoms she had last night.  "I just started a new job yesterday".    "I'm supposed to work today".    "I need the money".    "I can't afford to lose my job". If I have another episode I will go to the hospital.   I let her know it was important that she not put this off but to go on to the ED.   "I'll think about it".   "I'm stubborn"   "I really don't want to lose this new job".  See triage notes.    I sent these notes to Dr. Burman Blacksmith.    Reason for Disposition . [1] Chest pain lasts > 5 minutes AND [2] age > 45    7:30 PM last night left sided chest pain, down left arm with shortness of breath and nausea.   Right jaw hurt all day yesterday.  Answer Assessment - Initial Assessment Questions 1. LOCATION: "Where does it hurt?"       I had chest pain last night about 7:30 PM and it scared the S.. out of me.  I started a new job yesterday.    I came home and started with the chest pain when I got up to answer the phone.    The left side of my chest and my left arm hurt.   I was short of breath.   I took a baby aspirin because I've heard that's what you're supposed to do.   I didn't sleep last night because I was afraid I would die and not wake up. 2. RADIATION: "Does the pain go anywhere else?" (e.g., into neck, jaw, arms, back)     My jaw hurt really bad all day.   I feel sick on my stomach. 3. ONSET: "When did the chest pain begin?" (Minutes, hours or days)      7:30 PM last night but my jaw hurt all day on the right side. 4. PATTERN "Does the pain come and go, or has it been constant since it started?"  "Does it get worse with exertion?"       Not having any of the pain now.   I'm just tired now.     I have to work today and Monday.    5. DURATION: "How long does it last" (e.g., seconds, minutes, hours)     It lasted 30 minutes or so last night. 6. SEVERITY: "How bad is the pain?"  (e.g., Scale 1-10; mild, moderate, or severe)    - MILD (1-3): doesn't interfere with normal activities     - MODERATE (4-7): interferes with normal activities or awakens from sleep    - SEVERE (8-10): excruciating pain, unable to do any normal activities       9 on scale plus I couldn't breath.    It scared me and I don't scare easily 7. CARDIAC RISK FACTORS: "Do you have any history of heart problems or risk factors for heart disease?" (e.g., angina, prior heart attack; diabetes, high blood pressure, high cholesterol,  smoker, or strong family history of heart disease)     I do have hypertension.   I forgot my BP and thyroid medication yesterday.  Non smoker 8. PULMONARY RISK FACTORS: "Do you have any history of lung disease?"  (e.g., blood clots in lung, asthma, emphysema, birth control pills)     No blood clots, COPD or asthma.    9. CAUSE: "What do you think is causing the chest pain?"     Stress from money, my boyfriend is sick, I just started a new job yesterday.   I'm high strung.    I can't sit around.    10. OTHER SYMPTOMS: "Do you have any other symptoms?" (e.g., dizziness, nausea, vomiting, sweating, fever, difficulty breathing, cough)       I was dizzy last night, short of breath, no sweating, had nausea and diarrhea.   Not diabetic.    11. PREGNANCY: "Is there any chance you are pregnant?" "When was your last menstrual period?"       N/A due to age  Protocols used: CHEST PAIN-A-AH

## 2019-03-05 NOTE — Telephone Encounter (Signed)
Strongly advise she go to the ED.

## 2019-03-05 NOTE — Telephone Encounter (Signed)
Left pt a VM advising her to go to the ED and if she have any questions please give [place] a call back.

## 2019-03-05 NOTE — Telephone Encounter (Signed)
Dr. Matthews - please advise.  

## 2019-03-20 ENCOUNTER — Ambulatory Visit (INDEPENDENT_AMBULATORY_CARE_PROVIDER_SITE_OTHER): Payer: PPO | Admitting: Family Medicine

## 2019-03-20 ENCOUNTER — Other Ambulatory Visit: Payer: Self-pay

## 2019-03-20 DIAGNOSIS — J302 Other seasonal allergic rhinitis: Secondary | ICD-10-CM

## 2019-03-20 MED ORDER — PREDNISONE 10 MG (21) PO TBPK: ORAL_TABLET | ORAL | 0 refills | Status: DC

## 2019-03-20 MED ORDER — HYDROCODONE-HOMATROPINE 5-1.5 MG/5ML PO SYRP: 5.0000 mL | ORAL_SOLUTION | Freq: Three times a day (TID) | ORAL | 0 refills | Status: DC | PRN

## 2019-03-20 NOTE — Progress Notes (Signed)
Megan Martinez - 68 y.o. female MRN [id number]  Date of birth: [birth year]   This visit type was conducted due to national recommendations for restrictions regarding the COVID-19 Pandemic (e.g. social distancing).  This format is felt to be most appropriate for this patient at this time.  All issues noted in this document were discussed and addressed.  No physical exam was performed (except for noted visual exam findings with Video Visits).  I discussed the limitations of evaluation and management by telemedicine and the availability of in person appointments. The patient expressed understanding and agreed to proceed.  I connected with@ on 03/20/19 at  3:20 PM EDT by a video enabled telemedicine application and verified that I am speaking with the correct person using two identifiers.  Present for visit:  Gar Gibbon   Patient Location: Home [address] [place] [address]   Provider location:   [facility]  Chief Complaint  Patient presents with  . Cough    pt is having a chronic cough,  . Sinusitis    headache, sinus pressure, tender ears & throat drainage no fever    HPI  Megan Martinez is a 68 y.o. female who presents via audio/video conferencing for a telehealth visit today.  She has complaint of coryza, congestion, sinus pressure, headache and cough.  She does have chronic cough but recently this has worsened.  She denies shortness of breath or wheezing.  She has not had fever, chills, fatigue, body aches or GI symptoms. She has had improvement of her cough with coricidin.     ROS:  A comprehensive ROS was completed and negative except as noted per HPI  Past Medical History:  Diagnosis Date  . Allergy   . Arthritis   . Hypertension   . Thyroid disease     Past Surgical History:  Procedure Laterality Date  . ABDOMINAL HYSTERECTOMY    . APPENDECTOMY    . CATARACT EXTRACTION    . CHOLECYSTECTOMY    . TUBAL LIGATION      No family  history on file.  Social History   Socioeconomic History  . Marital status: Married    Spouse name: Not on file  . Number of children: Not on file  . Years of education: Not on file  . Highest education level: Not on file  Occupational History  . Not on file  Social Needs  . Financial resource strain: Not on file  . Food insecurity    Worry: Not on file    Inability: Not on file  . Transportation needs    Medical: Not on file    Non-medical: Not on file  Tobacco Use  . Smoking status: Former Smoker    Quit date: 1970    Years since quitting: 50.8  . Smokeless tobacco: Never Used  Substance and Sexual Activity  . Alcohol use: Yes    Comment: occass.   . Drug use: Never  . Sexual activity: Not on file  Lifestyle  . Physical activity    Days per week: Not on file    Minutes per session: Not on file  . Stress: Not on file  Relationships  . Social Herbalist on phone: Not on file    Gets together: Not on file    Attends religious service: Not on file    Active member of club or organization: Not on file    Attends meetings of clubs or organizations: Not on  file    Relationship status: Not on file  . Intimate partner violence    Fear of current or ex partner: Not on file    Emotionally abused: Not on file    Physically abused: Not on file    Forced sexual activity: Not on file  Other Topics Concern  . Not on file  Social History Narrative  . Not on file     Current Outpatient Medications:  .  aspirin 81 MG tablet, Take 81 mg by mouth daily., Disp: , Rfl:  .  Candesartan Cilexetil-HCTZ 32-25 MG TABS, TAKE 1 TABLET BY MOUTH DAILY, Disp: 90 each, Rfl: 1 .  fluticasone (FLONASE) 50 MCG/ACT nasal spray, Place 2 sprays into both nostrils daily., Disp: 16 g, Rfl: 6 .  levocetirizine (XYZAL) 5 MG tablet, TAKE 1 TABLET(5 MG) BY MOUTH EVERY EVENING, Disp: 90 tablet, Rfl: 1 .  levothyroxine (SYNTHROID) 137 MCG tablet, TAKE 1 TABLET BY MOUTH DAILY, Disp: 30 tablet,  Rfl: 3 .  amLODipine (NORVASC) 10 MG tablet, Take 1 tablet by mouth daily., Disp: , Rfl:  .  HYDROcodone-homatropine (HYCODAN) 5-1.5 MG/5ML syrup, Take 5 mLs by mouth every 8 (eight) hours as needed for cough., Disp: 120 mL, Rfl: 0 .  potassium chloride SA (K-DUR,KLOR-CON) 20 MEQ tablet, potassium chloride ER 20 mEq tablet,extended release(part/cryst), Disp: , Rfl:  .  predniSONE (STERAPRED UNI-PAK 21 TAB) 10 MG (21) TBPK tablet, Take as directed on packaging., Disp: 21 tablet, Rfl: 0 .  triamcinolone cream (KENALOG) 0.1 %, Apply 1 application topically 2 (two) times daily as needed. (Patient not taking: Reported on 01/03/2019), Disp: 450 g, Rfl: 0 .  UNABLE TO FIND, 750 mg daily. Med Name: CuraMed, Disp: , Rfl:   EXAM:  VITALS per patient if applicable: BP (!) A999333 Comment: per pt  Pulse (!) 54 Comment: per pt  Temp 97.7 F (36.5 C) (Oral) Comment: per pt Comment (Src): per pt  Ht 5\' 4"  (1.626 m)   Wt 165 lb 12.8 oz (75.2 kg) Comment: per pt  BMI 28.46 kg/m   GENERAL: alert, oriented, appears well and in no acute distress  HEENT: atraumatic, conjunttiva clear, no obvious abnormalities on inspection of external nose and ears  NECK: normal movements of the head and neck  LUNGS: on inspection no signs of respiratory distress, breathing rate appears normal, no obvious gross SOB, gasping or wheezing  CV: no obvious cyanosis  MS: moves all visible extremities without noticeable abnormality  PSYCH/NEURO: pleasant and cooperative, no obvious depression or anxiety, speech and thought processing grossly intact  ASSESSMENT AND PLAN:  Discussed the following assessment and plan:  Seasonal allergies -Symptoms seem related to allergies.  Improved with coricidin which contains antihistamine chlorpheniramine.  She may continue this or recommend change to cetirizine.  -Rx for prednisone to help with sinus inflammation -Hycodan cough syrup as needed for chronic cough.  Discussed referral to  allergist if this continues to be an issue.        I discussed the assessment and treatment plan with the patient. The patient was provided an opportunity to ask questions and all were answered. The patient agreed with the plan and demonstrated an understanding of the instructions.   The patient was advised to call back or seek an in-person evaluation if the symptoms worsen or if the condition fails to improve as anticipated.  [name], DO

## 2019-03-20 NOTE — Assessment & Plan Note (Signed)
-  Symptoms seem related to allergies.  Improved with coricidin which contains antihistamine chlorpheniramine.  She may continue this or recommend change to cetirizine.  -Rx for prednisone to help with sinus inflammation -Hycodan cough syrup as needed for chronic cough.  Discussed referral to allergist if this continues to be an issue.

## 2019-04-05 ENCOUNTER — Other Ambulatory Visit: Payer: Self-pay | Admitting: Family Medicine

## 2019-05-16 ENCOUNTER — Ambulatory Visit: Payer: PPO | Attending: Internal Medicine

## 2019-05-16 DIAGNOSIS — Z20828 Contact with and (suspected) exposure to other viral communicable diseases: Secondary | ICD-10-CM | POA: Diagnosis not present

## 2019-05-16 DIAGNOSIS — Z20822 Contact with and (suspected) exposure to covid-19: Secondary | ICD-10-CM

## 2019-05-17 LAB — NOVEL CORONAVIRUS, NAA: SARS-CoV-2, NAA: NOT DETECTED

## 2019-06-14 ENCOUNTER — Emergency Department (HOSPITAL_COMMUNITY): Payer: PPO

## 2019-06-14 ENCOUNTER — Encounter (HOSPITAL_COMMUNITY): Payer: Self-pay | Admitting: Neurology

## 2019-06-14 ENCOUNTER — Inpatient Hospital Stay (HOSPITAL_COMMUNITY): Payer: PPO

## 2019-06-14 ENCOUNTER — Inpatient Hospital Stay (HOSPITAL_COMMUNITY)
Admission: EM | Admit: 2019-06-14 | Discharge: 2019-06-20 | DRG: 062 | Disposition: A | Discharge status: Rehabilitation Facility | Payer: PPO | Attending: Neurology | Admitting: Neurology

## 2019-06-14 DIAGNOSIS — R0902 Hypoxemia: Secondary | ICD-10-CM | POA: Diagnosis not present

## 2019-06-14 DIAGNOSIS — R471 Dysarthria and anarthria: Secondary | ICD-10-CM | POA: Diagnosis not present

## 2019-06-14 DIAGNOSIS — R7303 Prediabetes: Secondary | ICD-10-CM | POA: Diagnosis not present

## 2019-06-14 DIAGNOSIS — M1990 Unspecified osteoarthritis, unspecified site: Secondary | ICD-10-CM | POA: Diagnosis present

## 2019-06-14 DIAGNOSIS — M8580 Other specified disorders of bone density and structure, unspecified site: Secondary | ICD-10-CM | POA: Diagnosis not present

## 2019-06-14 DIAGNOSIS — Z885 Allergy status to narcotic agent status: Secondary | ICD-10-CM

## 2019-06-14 DIAGNOSIS — Z79890 Hormone replacement therapy (postmenopausal): Secondary | ICD-10-CM | POA: Diagnosis not present

## 2019-06-14 DIAGNOSIS — Z79899 Other long term (current) drug therapy: Secondary | ICD-10-CM

## 2019-06-14 DIAGNOSIS — Z9849 Cataract extraction status, unspecified eye: Secondary | ICD-10-CM | POA: Diagnosis not present

## 2019-06-14 DIAGNOSIS — E876 Hypokalemia: Secondary | ICD-10-CM | POA: Diagnosis not present

## 2019-06-14 DIAGNOSIS — R739 Hyperglycemia, unspecified: Secondary | ICD-10-CM | POA: Diagnosis present

## 2019-06-14 DIAGNOSIS — M21371 Foot drop, right foot: Secondary | ICD-10-CM | POA: Diagnosis not present

## 2019-06-14 DIAGNOSIS — I6523 Occlusion and stenosis of bilateral carotid arteries: Secondary | ICD-10-CM | POA: Diagnosis not present

## 2019-06-14 DIAGNOSIS — I771 Stricture of artery: Secondary | ICD-10-CM | POA: Diagnosis not present

## 2019-06-14 DIAGNOSIS — Z6829 Body mass index (BMI) 29.0-29.9, adult: Secondary | ICD-10-CM

## 2019-06-14 DIAGNOSIS — I69322 Dysarthria following cerebral infarction: Secondary | ICD-10-CM | POA: Diagnosis not present

## 2019-06-14 DIAGNOSIS — N182 Chronic kidney disease, stage 2 (mild): Secondary | ICD-10-CM | POA: Diagnosis not present

## 2019-06-14 DIAGNOSIS — I129 Hypertensive chronic kidney disease with stage 1 through stage 4 chronic kidney disease, or unspecified chronic kidney disease: Secondary | ICD-10-CM | POA: Diagnosis not present

## 2019-06-14 DIAGNOSIS — Z20822 Contact with and (suspected) exposure to covid-19: Secondary | ICD-10-CM | POA: Diagnosis present

## 2019-06-14 DIAGNOSIS — I6300 Cerebral infarction due to thrombosis of unspecified precerebral artery: Secondary | ICD-10-CM | POA: Diagnosis not present

## 2019-06-14 DIAGNOSIS — Z880 Allergy status to penicillin: Secondary | ICD-10-CM | POA: Diagnosis not present

## 2019-06-14 DIAGNOSIS — E782 Mixed hyperlipidemia: Secondary | ICD-10-CM | POA: Diagnosis present

## 2019-06-14 DIAGNOSIS — I6350 Cerebral infarction due to unspecified occlusion or stenosis of unspecified cerebral artery: Secondary | ICD-10-CM | POA: Diagnosis not present

## 2019-06-14 DIAGNOSIS — G8101 Flaccid hemiplegia affecting right dominant side: Secondary | ICD-10-CM | POA: Diagnosis not present

## 2019-06-14 DIAGNOSIS — E8809 Other disorders of plasma-protein metabolism, not elsewhere classified: Secondary | ICD-10-CM | POA: Diagnosis not present

## 2019-06-14 DIAGNOSIS — E46 Unspecified protein-calorie malnutrition: Secondary | ICD-10-CM | POA: Diagnosis not present

## 2019-06-14 DIAGNOSIS — Z87891 Personal history of nicotine dependence: Secondary | ICD-10-CM | POA: Diagnosis not present

## 2019-06-14 DIAGNOSIS — I672 Cerebral atherosclerosis: Secondary | ICD-10-CM | POA: Diagnosis not present

## 2019-06-14 DIAGNOSIS — I69391 Dysphagia following cerebral infarction: Secondary | ICD-10-CM

## 2019-06-14 DIAGNOSIS — E890 Postprocedural hypothyroidism: Secondary | ICD-10-CM | POA: Diagnosis present

## 2019-06-14 DIAGNOSIS — I10 Essential (primary) hypertension: Secondary | ICD-10-CM | POA: Diagnosis not present

## 2019-06-14 DIAGNOSIS — R1310 Dysphagia, unspecified: Secondary | ICD-10-CM | POA: Diagnosis not present

## 2019-06-14 DIAGNOSIS — R29810 Facial weakness: Secondary | ICD-10-CM | POA: Diagnosis present

## 2019-06-14 DIAGNOSIS — I6389 Other cerebral infarction: Secondary | ICD-10-CM

## 2019-06-14 DIAGNOSIS — G8191 Hemiplegia, unspecified affecting right dominant side: Secondary | ICD-10-CM

## 2019-06-14 DIAGNOSIS — I69351 Hemiplegia and hemiparesis following cerebral infarction affecting right dominant side: Secondary | ICD-10-CM | POA: Diagnosis not present

## 2019-06-14 DIAGNOSIS — Z9049 Acquired absence of other specified parts of digestive tract: Secondary | ICD-10-CM

## 2019-06-14 DIAGNOSIS — Z90710 Acquired absence of both cervix and uterus: Secondary | ICD-10-CM

## 2019-06-14 DIAGNOSIS — Z888 Allergy status to other drugs, medicaments and biological substances status: Secondary | ICD-10-CM | POA: Diagnosis not present

## 2019-06-14 DIAGNOSIS — R52 Pain, unspecified: Secondary | ICD-10-CM | POA: Diagnosis not present

## 2019-06-14 DIAGNOSIS — Z7982 Long term (current) use of aspirin: Secondary | ICD-10-CM

## 2019-06-14 DIAGNOSIS — Z91040 Latex allergy status: Secondary | ICD-10-CM

## 2019-06-14 DIAGNOSIS — R1312 Dysphagia, oropharyngeal phase: Secondary | ICD-10-CM | POA: Diagnosis not present

## 2019-06-14 DIAGNOSIS — I651 Occlusion and stenosis of basilar artery: Secondary | ICD-10-CM | POA: Diagnosis not present

## 2019-06-14 DIAGNOSIS — E7800 Pure hypercholesterolemia, unspecified: Secondary | ICD-10-CM | POA: Diagnosis not present

## 2019-06-14 DIAGNOSIS — G9389 Other specified disorders of brain: Secondary | ICD-10-CM | POA: Diagnosis not present

## 2019-06-14 DIAGNOSIS — E039 Hypothyroidism, unspecified: Secondary | ICD-10-CM | POA: Diagnosis not present

## 2019-06-14 DIAGNOSIS — I639 Cerebral infarction, unspecified: Secondary | ICD-10-CM | POA: Diagnosis not present

## 2019-06-14 DIAGNOSIS — Z9851 Tubal ligation status: Secondary | ICD-10-CM

## 2019-06-14 DIAGNOSIS — R001 Bradycardia, unspecified: Secondary | ICD-10-CM | POA: Diagnosis not present

## 2019-06-14 DIAGNOSIS — E785 Hyperlipidemia, unspecified: Secondary | ICD-10-CM | POA: Diagnosis not present

## 2019-06-14 DIAGNOSIS — R4781 Slurred speech: Secondary | ICD-10-CM | POA: Diagnosis not present

## 2019-06-14 DIAGNOSIS — R0989 Other specified symptoms and signs involving the circulatory and respiratory systems: Secondary | ICD-10-CM | POA: Diagnosis not present

## 2019-06-14 DIAGNOSIS — I6322 Cerebral infarction due to unspecified occlusion or stenosis of basilar arteries: Secondary | ICD-10-CM | POA: Diagnosis not present

## 2019-06-14 DIAGNOSIS — E663 Overweight: Secondary | ICD-10-CM | POA: Diagnosis not present

## 2019-06-14 DIAGNOSIS — M21331 Wrist drop, right wrist: Secondary | ICD-10-CM | POA: Diagnosis not present

## 2019-06-14 DIAGNOSIS — R7401 Elevation of levels of liver transaminase levels: Secondary | ICD-10-CM | POA: Diagnosis not present

## 2019-06-14 LAB — COMPREHENSIVE METABOLIC PANEL
ALT: 19 U/L (ref 0–44)
AST: 19 U/L (ref 15–41)
Albumin: 3.9 g/dL (ref 3.5–5.0)
Alkaline Phosphatase: 38 U/L (ref 38–126)
Anion gap: 13 (ref 5–15)
BUN: 12 mg/dL (ref 8–23)
CO2: 25 mmol/L (ref 22–32)
Calcium: 9.4 mg/dL (ref 8.9–10.3)
Chloride: 101 mmol/L (ref 98–111)
Creatinine, Ser: 0.81 mg/dL (ref 0.44–1.00)
GFR calc Af Amer: >60 mL/min (ref >60)
GFR calc non Af Amer: >60 mL/min (ref >60)
Glucose, Bld: 152 mg/dL — ABNORMAL HIGH (ref 70–99)
Potassium: 3 mmol/L — ABNORMAL LOW (ref 3.5–5.1)
Sodium: 139 mmol/L (ref 135–145)
Total Bilirubin: 0.5 mg/dL (ref 0.3–1.2)
Total Protein: 6.5 g/dL (ref 6.5–8.1)

## 2019-06-14 LAB — URINALYSIS, ROUTINE W REFLEX MICROSCOPIC
Bilirubin Urine: NEGATIVE
Glucose, UA: NEGATIVE mg/dL
Hgb urine dipstick: NEGATIVE
Ketones, ur: NEGATIVE mg/dL
Nitrite: NEGATIVE
Protein, ur: NEGATIVE mg/dL
Specific Gravity, Urine: >1.046 — ABNORMAL HIGH (ref 1.005–1.030)
pH: 7 (ref 5.0–8.0)

## 2019-06-14 LAB — CBC
HCT: 43.9 % (ref 36.0–46.0)
Hemoglobin: 14.6 g/dL (ref 12.0–15.0)
MCH: 28.8 pg (ref 26.0–34.0)
MCHC: 33.3 g/dL (ref 30.0–36.0)
MCV: 86.6 fL (ref 80.0–100.0)
Platelets: 295 10*3/uL (ref 150–400)
RBC: 5.07 MIL/uL (ref 3.87–5.11)
RDW: 13.1 % (ref 11.5–15.5)
WBC: 5.4 10*3/uL (ref 4.0–10.5)
nRBC: 0 % (ref 0.0–0.2)

## 2019-06-14 LAB — I-STAT CHEM 8, ED
BUN: 14 mg/dL (ref 8–23)
Calcium, Ion: 1.15 mmol/L (ref 1.15–1.40)
Chloride: 99 mmol/L (ref 98–111)
Creatinine, Ser: 0.8 mg/dL (ref 0.44–1.00)
Glucose, Bld: 153 mg/dL — ABNORMAL HIGH (ref 70–99)
HCT: 41 % (ref 36.0–46.0)
Hemoglobin: 13.9 g/dL (ref 12.0–15.0)
Potassium: 3.1 mmol/L — ABNORMAL LOW (ref 3.5–5.1)
Sodium: 139 mmol/L (ref 135–145)
TCO2: 31 mmol/L (ref 22–32)

## 2019-06-14 LAB — DIFFERENTIAL
Abs Immature Granulocytes: 0.02 10*3/uL (ref 0.00–0.07)
Basophils Absolute: 0.1 10*3/uL (ref 0.0–0.1)
Basophils Relative: 1 %
Eosinophils Absolute: 0.2 10*3/uL (ref 0.0–0.5)
Eosinophils Relative: 3 %
Immature Granulocytes: 0 %
Lymphocytes Relative: 29 %
Lymphs Abs: 1.6 10*3/uL (ref 0.7–4.0)
Monocytes Absolute: 0.4 10*3/uL (ref 0.1–1.0)
Monocytes Relative: 7 %
Neutro Abs: 3.2 10*3/uL (ref 1.7–7.7)
Neutrophils Relative %: 60 %

## 2019-06-14 LAB — APTT: aPTT: 27 seconds (ref 24–36)

## 2019-06-14 LAB — RAPID URINE DRUG SCREEN, HOSP PERFORMED
Amphetamines: NOT DETECTED
Barbiturates: NOT DETECTED
Benzodiazepines: NOT DETECTED
Cocaine: NOT DETECTED
Opiates: NOT DETECTED
Tetrahydrocannabinol: NOT DETECTED

## 2019-06-14 LAB — PROTIME-INR
INR: 1 (ref 0.8–1.2)
Prothrombin Time: 13 seconds (ref 11.4–15.2)

## 2019-06-14 LAB — ECHOCARDIOGRAM COMPLETE
Height: 64 in
Weight: 2705.49 oz

## 2019-06-14 LAB — RESPIRATORY PANEL BY RT PCR (FLU A&B, COVID)
Influenza A by PCR: NEGATIVE
Influenza B by PCR: NEGATIVE
SARS Coronavirus 2 by RT PCR: NEGATIVE

## 2019-06-14 LAB — ETHANOL: Alcohol, Ethyl (B): <10 mg/dL (ref <10)

## 2019-06-14 LAB — HIV ANTIBODY (ROUTINE TESTING W REFLEX): HIV Screen 4th Generation wRfx: NONREACTIVE

## 2019-06-14 MED ORDER — CHLORHEXIDINE GLUCONATE CLOTH 2 % EX PADS
6.0000 | MEDICATED_PAD | Freq: Every day | CUTANEOUS | Status: DC
  Administered 2019-06-15 – 2019-06-20 (×4): 6 via TOPICAL

## 2019-06-14 MED ORDER — RESOURCE THICKENUP CLEAR PO POWD: Freq: Once | ORAL | Status: DC

## 2019-06-14 MED ORDER — IOHEXOL 350 MG/ML SOLN
100.0000 mL | Freq: Once | INTRAVENOUS | Status: AC | PRN
  Administered 2019-06-14: 100 mL via INTRAVENOUS

## 2019-06-14 MED ORDER — LABETALOL HCL 5 MG/ML IV SOLN: 10.0000 mg | Freq: Once | INTRAVENOUS | Status: DC | PRN

## 2019-06-14 MED ORDER — PANTOPRAZOLE SODIUM 40 MG IV SOLR
40.0000 mg | Freq: Every day | INTRAVENOUS | Status: DC
  Administered 2019-06-14: 40 mg via INTRAVENOUS
  Filled 2019-06-14: qty 40

## 2019-06-14 MED ORDER — SODIUM CHLORIDE 0.9 % IV SOLN
50.0000 mL | Freq: Once | INTRAVENOUS | Status: AC
  Administered 2019-06-14: 50 mL via INTRAVENOUS

## 2019-06-14 MED ORDER — STROKE: EARLY STAGES OF RECOVERY BOOK
Freq: Once | Status: AC
  Filled 2019-06-14: qty 1

## 2019-06-14 MED ORDER — ALTEPLASE (STROKE) FULL DOSE INFUSION
0.9000 mg/kg | Freq: Once | INTRAVENOUS | Status: AC
  Administered 2019-06-14: 69 mg via INTRAVENOUS
  Filled 2019-06-14: qty 100

## 2019-06-14 MED ORDER — ACETAMINOPHEN 160 MG/5ML PO SOLN: 650.0000 mg | ORAL | Status: DC | PRN

## 2019-06-14 MED ORDER — SODIUM CHLORIDE 0.9 % IV SOLN
50.0000 mL/h | INTRAVENOUS | Status: DC
  Administered 2019-06-14: 50 mL/h via INTRAVENOUS

## 2019-06-14 MED ORDER — ACETAMINOPHEN 650 MG RE SUPP: 650.0000 mg | RECTAL | Status: DC | PRN

## 2019-06-14 MED ORDER — ACETAMINOPHEN 325 MG PO TABS
650.0000 mg | ORAL_TABLET | ORAL | Status: DC | PRN
  Administered 2019-06-14: 650 mg via ORAL
  Filled 2019-06-14: qty 2

## 2019-06-14 MED ORDER — POTASSIUM CHLORIDE 10 MEQ/100ML IV SOLN
10.0000 meq | INTRAVENOUS | Status: AC
  Administered 2019-06-14 (×4): 10 meq via INTRAVENOUS
  Filled 2019-06-14 (×4): qty 100

## 2019-06-14 MED ORDER — NICARDIPINE HCL IN NACL 20-0.86 MG/200ML-% IV SOLN: 0.0000 mg/h | INTRAVENOUS | Status: DC | PRN

## 2019-06-14 NOTE — Evaluation (Signed)
Speech Language Pathology Evaluation Patient Details Name: Megan Martinez MRN: [id number] DOB: 1950-10-04 Today's Date: 06/14/2019 Time: [id number] SLP Time Calculation (min) (ACUTE ONLY): 15 min  Problem List:  Patient Active Problem List   Diagnosis Date Noted  . Stroke (cerebrum) ([place]) 06/14/2019  . Subcutaneous nodule of left foot 01/03/2019  . Atopic dermatitis 10/18/2018  . Seasonal allergies 08/09/2018  . 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 Osteoarthritis of carpometacarpal (CMC) joint of thumb 11/07/2017  . Acquired hypothyroidism 08/04/2015  . Essential hypertension 08/04/2015  . GERD without esophagitis 08/04/2015  . Mixed hyperlipidemia 08/04/2015  . Osteopenia 08/04/2015  . Primary insomnia 08/04/2015  . Pure hypertriglyceridemia 08/04/2015   Past Medical History:  Past Medical History:  Diagnosis Date  . Allergy   . Arthritis   . Hypertension   . Thyroid disease    Past Surgical History:  Past Surgical History:  Procedure Laterality Date  . ABDOMINAL HYSTERECTOMY    . APPENDECTOMY    . CATARACT EXTRACTION    . CHOLECYSTECTOMY    . TUBAL LIGATION     HPI:   69 y.o. female with a history of hypertension admitted with right sided hemiparesis and facial asymmetry; received IV TPA.  Dx acute left paramedian pontine CVA.    Assessment / Plan / Recommendation Clinical Impression  Pt presents with a moderate dysarthria of speech with elements of hypernasal resonance and decreased respiratory timing exacerbating articulatory errors.  Expressive, receptive language, and basic cognition are [facility]. Pt demonstrates ability to improve clarity by decreasing rate and intentionally thinking about articulatory placement.  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  She is an excellent CIR  candidate -will await OT/PT evals.  Recommend SLP for speech and swallowing therapy while in acute care.     SLP Assessment  SLP Recommendation/Assessment: Patient needs continued Speech Lanaguage Pathology Services SLP Visit Diagnosis: Dysarthria and anarthria (R47.1)    Follow Up Recommendations  Inpatient Rehab    Frequency and Duration min 3x week  2 weeks      SLP Evaluation Cognition  Overall Cognitive Status: Within Functional Limits for tasks assessed       Comprehension  Auditory Comprehension Overall Auditory Comprehension: Appears within functional limits for tasks assessed Reading Comprehension Reading Status: Not tested    Expression Expression Primary Mode of Expression: Verbal Verbal Expression Overall Verbal Expression: Appears within functional limits for tasks assessed Written Expression Dominant Hand: Right   Oral / Motor  Oral Motor/Sensory Function Overall Oral Motor/Sensory Function: Moderate impairment Facial ROM: Reduced right;Suspected CN VII (facial) dysfunction Facial Symmetry: Abnormal symmetry right;Suspected CN VII (facial) dysfunction Facial Sensation: Within Functional Limits Lingual ROM: Suspected CN XII (hypoglossal) dysfunction Lingual Symmetry: Suspected CN XII (hypoglossal) dysfunction Velum: Other (comment)(decreased elevation) Motor Speech Overall Motor Speech: Impaired Respiration: Within functional limits Phonation: Normal Resonance: Hypernasality Articulation: Impaired Level of Impairment: Word Intelligibility: Intelligibility reduced Word: 50-74% accurate Phrase: 50-74% accurate Sentence: 50-74% accurate Conversation: 50-74% accurate Motor Planning: Witnin functional limits Effective Techniques: Slow rate;Over-articulate;Pause   GO                    Juan Quam Laurice 06/14/2019, 3:12 PM  Shadie Sweatman L. Tivis Ringer, [place] Office number [phone] Pager [phone]

## 2019-06-14 NOTE — ED Provider Notes (Signed)
[facility] EMERGENCY DEPARTMENT Provider Note   CSN: [id number] Arrival date & time: 06/14/19  [id number]  LEVEL 5 CAVEAT - ACUITY OF CONDITION History No chief complaint on file.   Megan Martinez is a 69 y.o. female.  HPI 69 year old female presents as a code stroke.  Last known normal was 9:30 PM last night.  Just when she went to bed.  Did not get up in the middle the night.  When she awoke she had right-sided weakness and trouble speaking.  Has been having some headache as well.  EMS systolic blood pressure [id number].   Past Medical History:  Diagnosis Date  . Allergy   . Arthritis   . Hypertension   . Thyroid disease     Patient Active Problem List   Diagnosis Date Noted  . 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 Subcutaneous nodule of left foot 01/03/2019  . Atopic dermatitis 10/18/2018  . Seasonal allergies 08/09/2018  . 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 Osteoarthritis of carpometacarpal (CMC) joint of thumb 11/07/2017  . Acquired hypothyroidism 08/04/2015  . Essential hypertension 08/04/2015  . GERD without esophagitis 08/04/2015  . Mixed hyperlipidemia 08/04/2015  . Osteopenia 08/04/2015  . Primary insomnia 08/04/2015  . Pure hypertriglyceridemia 08/04/2015    Past Surgical History:  Procedure Laterality Date  . ABDOMINAL HYSTERECTOMY    . APPENDECTOMY    . CATARACT EXTRACTION    . CHOLECYSTECTOMY    . TUBAL LIGATION       OB History   No obstetric history on file.     No family history on file.  Social History   Tobacco Use  . Smoking status: Former Smoker    Quit date: 1970    Years since quitting: 51.0  . Smokeless tobacco: Never Used  Substance Use Topics  . Alcohol use: Yes    Comment: occass.   . Drug use: Never    Home Medications Prior to Admission medications   Medication Sig Start Date End Date Taking? Authorizing Provider  amLODipine (NORVASC) 10 MG tablet Take 1 tablet by  mouth daily. 06/18/18   [provider]  aspirin 81 MG tablet Take 81 mg by mouth daily.    [provider]  Candesartan Cilexetil-HCTZ 32-25 MG TABS TAKE 1 TABLET BY MOUTH DAILY 09/15/18   [name], DO  fluticasone (FLONASE) 50 MCG/ACT nasal spray Place 2 sprays into both nostrils daily. 08/09/18   [name], DO  HYDROcodone-homatropine (HYCODAN) 5-1.5 MG/5ML syrup Take 5 mLs by mouth every 8 (eight) hours as needed for cough. 03/20/19   [name], DO  levocetirizine (XYZAL) 5 MG tablet TAKE 1 TABLET(5 MG) BY MOUTH EVERY EVENING 02/19/19   [name], DO  levothyroxine (SYNTHROID) 137 MCG tablet TAKE 1 TABLET BY MOUTH DAILY 04/05/19   [name], DO  potassium chloride SA (K-DUR,KLOR-CON) 20 MEQ tablet potassium chloride ER 20 mEq tablet,extended release(part/cryst) 01/20/17   [provider]  predniSONE (STERAPRED UNI-PAK 21 TAB) 10 MG (21) TBPK tablet Take as directed on packaging. 03/20/19   [name], DO  triamcinolone cream (KENALOG) 0.1 % Apply 1 application topically 2 (two) times daily as needed. Patient not taking: Reported on 01/03/2019 10/18/18   [name], DO  UNABLE TO FIND 750 mg daily. Med Name: [facility]    [provider]    Allergies    Latex, Statins, Penicillin g, Amoxicillin, and Codeine  Review of Systems   Review of Systems  Unable  to perform ROS: Acuity of condition    Physical Exam Updated Vital Signs BP (!) 149/87 (BP Location: Left Arm)   Pulse 83   Temp 99.1 F (37.3 C) (Oral)   Resp 20   Wt 76.7 kg   SpO2 96%   BMI 29.01 kg/m   Physical Exam Vitals and nursing note reviewed.  Constitutional:      General: She is not in acute distress.    Appearance: She is well-developed. She is not ill-appearing or diaphoretic.  HENT:     Head: Normocephalic and atraumatic.     Right Ear: External ear normal.     Left Ear: External ear normal.     Nose: Nose normal.  Eyes:     General:         Right eye: No discharge.        Left eye: No discharge.     Extraocular Movements: Extraocular movements intact.     Pupils: Pupils are equal, round, and reactive to light.  Cardiovascular:     Rate and Rhythm: Normal rate and regular rhythm.     Heart sounds: Normal heart sounds.  Pulmonary:     Effort: Pulmonary effort is normal.     Breath sounds: Normal breath sounds.  Abdominal:     Palpations: Abdomen is soft.     Tenderness: There is no abdominal tenderness.  Skin:    General: Skin is warm and dry.  Neurological:     Mental Status: She is alert.     Comments: Awake, alert. Slurred speech. Right sided facial droop. Flaccid right arm and leg. Normal strength left arm/leg  Psychiatric:        Mood and Affect: Mood is not anxious.     ED Results / Procedures / Treatments   Labs (all labs ordered are listed, but only abnormal results are displayed) Labs Reviewed  COMPREHENSIVE METABOLIC PANEL - Abnormal; Notable for the following components:      Result Value   Potassium 3.0 (*)    Glucose, Bld 152 (*)    All other components within normal limits  I-STAT CHEM 8, ED - Abnormal; Notable for the following components:   Potassium 3.1 (*)    Glucose, Bld 153 (*)    All other components within normal limits  RESPIRATORY PANEL BY RT PCR (FLU A&B, COVID)  ETHANOL  PROTIME-INR  APTT  CBC  DIFFERENTIAL  RAPID URINE DRUG SCREEN, HOSP PERFORMED  URINALYSIS, ROUTINE W REFLEX MICROSCOPIC  HIV ANTIBODY (ROUTINE TESTING W REFLEX)    EKG None  Radiology CT Code Stroke CTA Head W/WO contrast  Result Date: 06/14/2019 CLINICAL DATA:  Ataxia.  Facial droop. EXAM: CT ANGIOGRAPHY HEAD AND NECK CT PERFUSION BRAIN TECHNIQUE: Multidetector CT imaging of the head and neck was performed using the standard protocol during bolus administration of intravenous contrast. Multiplanar CT image reconstructions and MIPs were obtained to evaluate the vascular anatomy. Carotid stenosis measurements  (when applicable) are obtained utilizing NASCET criteria, using the distal internal carotid diameter as the denominator. Multiphase CT imaging of the brain was performed following IV bolus contrast injection. Subsequent parametric perfusion maps were calculated using RAPID software. CONTRAST:  162mL OMNIPAQUE IOHEXOL 350 MG/ML SOLN COMPARISON:  CT head without contrast of the same day. FINDINGS: CTA NECK FINDINGS Aortic arch: There is common origin of the left common carotid artery and the innominate artery. Aortic arch and great vessel origins are within normal limits otherwise. No atherosclerotic change, stenosis, or aneurysm is  present. Right carotid system: The right common carotid artery is within normal limits. Bifurcation is unremarkable. Cervical right ICA is normal. Left carotid system: The left common carotid artery is within normal limits. Mild atherosclerotic changes are noted in the proximal left ICA without stenosis significant stenosis relative to the more distal vessel. Vertebral arteries: The left vertebral artery is the dominant vessel. Both vertebral arteries originate from the subclavian arteries without significant stenosis. There is no significant stenosis of either vertebral artery in the neck. Skeleton: Degenerative anterolisthesis present C3-4 and C4-5. Endplate changes contribute to mild foraminal narrowing bilaterally at C5-6. No focal lytic or blastic lesions are present. Other neck: Neck soft tissues are otherwise within normal limits. Upper chest: Lung apices are clear. Thoracic inlet is within normal limits. Review of the MIP images confirms the above findings CTA HEAD FINDINGS Anterior circulation: Atherosclerotic changes are present within the cavernous internal carotid arteries bilaterally without significant stenosis through the ICA termini. Left posterior communicating artery is noted. The A1 and M1 segments are normal. Left A1 segment is dominant. The anterior communicating  artery is patent. MCA bifurcations are within normal limits bilaterally. The ACA and MCA branch vessels are within normal limits. Posterior circulation: Atherosclerotic calcifications are present at the dural margin of the left vertebral artery. 50% stenosis is present. Is some narrowing of distal vertebral arteries scratched at there is some narrowing of distal V4 segments bilaterally. The proximal basilar artery is small. A high-grade mid basilar stenosis is present at the level of the pons. There is flow in the distal basilar artery. A left posterior communicating artery contributes. Moderate proximal PCA stenoses are present bilaterally. There is attenuation of distal PCA branch vessels, right greater than left. Venous sinuses: The dural sinuses are patent. The straight sinus deep cerebral veins are intact. Cortical veins are unremarkable. No vascular malformations are present. Anatomic variants: Prominent left posterior communicating artery. Review of the MIP images confirms the above findings CT Brain Perfusion Findings: ASPECTS: 10/10 CBF (<30%) Volume: 24mL Perfusion (Tmax>6.0s) volume: 27mL. There is some indication of ischemia in the cerebellum when a shorter T-max is used. Mismatch Volume: 48mL Infarction Location:Posterior fossa ischemia IMPRESSION: 1. 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 2. Moderate proximal PCA stenoses bilaterally. 3. 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 4. CT perfusion demonstrates no anterior circulation infarct or ischemia. 5. Question ischemia within the cerebellum. 6. Atherosclerotic changes at the left proximal ICA and bilateral cavernous internal carotid arteries without significant stenosis in the anterior circulation. These results were called by telephone at the time of interpretation on 06/14/2019 at 7:58 am to provider MCNEILL [facility] , who verbally acknowledged these results.  Electronically Signed   By: San Morelle M.D.   On: 06/14/2019 08:13   CT Code Stroke CTA Neck W/WO contrast  Result Date: 06/14/2019 CLINICAL DATA:  Ataxia.  Facial droop. EXAM: CT ANGIOGRAPHY HEAD AND NECK CT PERFUSION BRAIN TECHNIQUE: Multidetector CT imaging of the head and neck was performed using the standard protocol during bolus administration of intravenous contrast. Multiplanar CT image reconstructions and MIPs were obtained to evaluate the vascular anatomy. Carotid stenosis measurements (when applicable) are obtained utilizing NASCET criteria, using the distal internal carotid diameter as the denominator. Multiphase CT imaging of the brain was performed following IV bolus contrast injection. Subsequent parametric perfusion maps were calculated using RAPID software. CONTRAST:  131mL OMNIPAQUE IOHEXOL 350 MG/ML SOLN COMPARISON:  CT  head without contrast of the same day. FINDINGS: CTA NECK FINDINGS Aortic arch: There is common origin of the left common carotid artery and the innominate artery. Aortic arch and great vessel origins are within normal limits otherwise. No atherosclerotic change, stenosis, or aneurysm is present. Right carotid system: The right common carotid artery is within normal limits. Bifurcation is unremarkable. Cervical right ICA is normal. Left carotid system: The left common carotid artery is within normal limits. Mild atherosclerotic changes are noted in the proximal left ICA without stenosis significant stenosis relative to the more distal vessel. Vertebral arteries: The left vertebral artery is the dominant vessel. Both vertebral arteries originate from the subclavian arteries without significant stenosis. There is no significant stenosis of either vertebral artery in the neck. Skeleton: Degenerative anterolisthesis present C3-4 and C4-5. Endplate changes contribute to mild foraminal narrowing bilaterally at C5-6. No focal lytic or blastic lesions are present. Other neck:  Neck soft tissues are otherwise within normal limits. Upper chest: Lung apices are clear. Thoracic inlet is within normal limits. Review of the MIP images confirms the above findings CTA HEAD FINDINGS Anterior circulation: Atherosclerotic changes are present within the cavernous internal carotid arteries bilaterally without significant stenosis through the ICA termini. Left posterior communicating artery is noted. The A1 and M1 segments are normal. Left A1 segment is dominant. The anterior communicating artery is patent. MCA bifurcations are within normal limits bilaterally. The ACA and MCA branch vessels are within normal limits. Posterior circulation: Atherosclerotic calcifications are present at the dural margin of the left vertebral artery. 50% stenosis is present. Is some narrowing of distal vertebral arteries scratched at there is some narrowing of distal V4 segments bilaterally. The proximal basilar artery is small. A high-grade mid basilar stenosis is present at the level of the pons. There is flow in the distal basilar artery. A left posterior communicating artery contributes. Moderate proximal PCA stenoses are present bilaterally. There is attenuation of distal PCA branch vessels, right greater than left. Venous sinuses: The dural sinuses are patent. The straight sinus deep cerebral veins are intact. Cortical veins are unremarkable. No vascular malformations are present. Anatomic variants: Prominent left posterior communicating artery. Review of the MIP images confirms the above findings CT Brain Perfusion Findings: ASPECTS: 10/10 CBF (<30%) Volume: 14mL Perfusion (Tmax>6.0s) volume: 54mL. There is some indication of ischemia in the cerebellum when a shorter T-max is used. Mismatch Volume: 2mL Infarction Location:Posterior fossa ischemia IMPRESSION: 1. 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 2. Moderate proximal PCA stenoses bilaterally. 3. 50% stenosis of the left vertebral artery at the dural margin  and mild narrowing of distal V4 segments bilaterally. 4. CT perfusion demonstrates no anterior circulation infarct or ischemia. 5. Question ischemia within the cerebellum. 6. Atherosclerotic changes at the left proximal ICA and bilateral cavernous internal carotid arteries without significant stenosis in the anterior circulation. These results were called by telephone at the time of interpretation on 06/14/2019 at 7:58 am to provider MCNEILL [facility] , who verbally acknowledged these results. Electronically Signed   By: San Morelle M.D.   On: 06/14/2019 08:13   MR BRAIN WO CONTRAST  Result Date: 06/14/2019 CLINICAL DATA:  Wake up stroke. Ataxia and facial droop. EXAM: MRI HEAD WITHOUT CONTRAST TECHNIQUE: Multiplanar, multiecho pulse sequences of the brain and surrounding structures were obtained without intravenous contrast. COMPARISON:  CT head, CTA, and CT perfusion 06/14/2019 FINDINGS: Brain: Diffusion-weighted images demonstrate restricted diffusion in the left paramedian pons. No significant T2 or FLAIR signal is associated.  There may be some diffusion abnormality in the midbrain. No acute hemorrhage is present. Minimal white matter disease is present otherwise, likely within normal limits for age. The ventricles are of normal size. No significant extraaxial fluid collection is present. Vascular: Abnormal flow signal is present in mid basilar artery consistent with known high-grade stenosis. Skull and upper cervical spine: The craniocervical junction is normal. Upper cervical spine is within normal limits. Marrow signal is unremarkable. Sinuses/Orbits: The paranasal sinuses and mastoid air cells are clear. Bilateral lens replacements are noted. Globes and orbits are otherwise unremarkable. IMPRESSION: 1. 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 2. 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 3. No other acute intracranial  abnormality. These results were called by telephone at the time of interpretation on 06/14/2019 at 8:07am to provider MCNEILL [facility] , who verbally acknowledged these results. Electronically Signed   By: San Morelle M.D.   On: 06/14/2019 08:17   CT Code Stroke Cerebral Perfusion with contrast  Result Date: 06/14/2019 CLINICAL DATA:  Ataxia.  Facial droop. EXAM: CT ANGIOGRAPHY HEAD AND NECK CT PERFUSION BRAIN TECHNIQUE: Multidetector CT imaging of the head and neck was performed using the standard protocol during bolus administration of intravenous contrast. Multiplanar CT image reconstructions and MIPs were obtained to evaluate the vascular anatomy. Carotid stenosis measurements (when applicable) are obtained utilizing NASCET criteria, using the distal internal carotid diameter as the denominator. Multiphase CT imaging of the brain was performed following IV bolus contrast injection. Subsequent parametric perfusion maps were calculated using RAPID software. CONTRAST:  141mL OMNIPAQUE IOHEXOL 350 MG/ML SOLN COMPARISON:  CT head without contrast of the same day. FINDINGS: CTA NECK FINDINGS Aortic arch: There is common origin of the left common carotid artery and the innominate artery. Aortic arch and great vessel origins are within normal limits otherwise. No atherosclerotic change, stenosis, or aneurysm is present. Right carotid system: The right common carotid artery is within normal limits. Bifurcation is unremarkable. Cervical right ICA is normal. Left carotid system: The left common carotid artery is within normal limits. Mild atherosclerotic changes are noted in the proximal left ICA without stenosis significant stenosis relative to the more distal vessel. Vertebral arteries: The left vertebral artery is the dominant vessel. Both vertebral arteries originate from the subclavian arteries without significant stenosis. There is no significant stenosis of either vertebral artery in the neck.  Skeleton: Degenerative anterolisthesis present C3-4 and C4-5. Endplate changes contribute to mild foraminal narrowing bilaterally at C5-6. No focal lytic or blastic lesions are present. Other neck: Neck soft tissues are otherwise within normal limits. Upper chest: Lung apices are clear. Thoracic inlet is within normal limits. Review of the MIP images confirms the above findings CTA HEAD FINDINGS Anterior circulation: Atherosclerotic changes are present within the cavernous internal carotid arteries bilaterally without significant stenosis through the ICA termini. Left posterior communicating artery is noted. The A1 and M1 segments are normal. Left A1 segment is dominant. The anterior communicating artery is patent. MCA bifurcations are within normal limits bilaterally. The ACA and MCA branch vessels are within normal limits. Posterior circulation: Atherosclerotic calcifications are present at the dural margin of the left vertebral artery. 50% stenosis is present. Is some narrowing of distal vertebral arteries scratched at there is some narrowing of distal V4 segments bilaterally. The proximal basilar artery is small. A high-grade mid basilar stenosis is present at the level of the pons. There is flow in the distal basilar artery. A left posterior communicating  artery contributes. Moderate proximal PCA stenoses are present bilaterally. There is attenuation of distal PCA branch vessels, right greater than left. Venous sinuses: The dural sinuses are patent. The straight sinus deep cerebral veins are intact. Cortical veins are unremarkable. No vascular malformations are present. Anatomic variants: Prominent left posterior communicating artery. Review of the MIP images confirms the above findings CT Brain Perfusion Findings: ASPECTS: 10/10 CBF (<30%) Volume: 74mL Perfusion (Tmax>6.0s) volume: 56mL. There is some indication of ischemia in the cerebellum when a shorter T-max is used. Mismatch Volume: 2mL Infarction  Location:Posterior fossa ischemia IMPRESSION: 1. 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 2. Moderate proximal PCA stenoses bilaterally. 3. 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 4. CT perfusion demonstrates no anterior circulation infarct or ischemia. 5. Question ischemia within the cerebellum. 6. Atherosclerotic changes at the left proximal ICA and bilateral cavernous internal carotid arteries without significant stenosis in the anterior circulation. These results were called by telephone at the time of interpretation on 06/14/2019 at 7:58 am to provider MCNEILL [facility] , who verbally acknowledged these results. Electronically Signed   By: San Morelle M.D.   On: 06/14/2019 08:13   CT HEAD CODE STROKE WO CONTRAST  Result Date: 06/14/2019 CLINICAL DATA:  Code stroke. Ataxia. Facial droop and slurred speech. Last seen normal at 9:30 p.m. last night. EXAM: CT HEAD WITHOUT CONTRAST TECHNIQUE: Contiguous axial images were obtained from the base of the skull through the vertex without intravenous contrast. COMPARISON:  None. FINDINGS: Brain: No acute infarct, hemorrhage, or mass lesion is present. The ventricles are of normal size. No significant white matter lesions are present. No significant extraaxial fluid collection is present. Vascular: Atherosclerotic calcifications are present within the cavernous internal carotid arteries bilaterally. There is no hyperdense vessel. Skull: Calvarium is intact. No focal lytic or blastic lesions are present. No significant extracranial soft tissue lesion is present. Sinuses/Orbits: The paranasal sinuses and mastoid air cells are clear. Bilateral lens replacements are noted. Globes and orbits are otherwise unremarkable. ASPECTS [facility] Stroke Program Early CT Score) - Ganglionic level infarction (caudate, lentiform nuclei, internal capsule, insula, M1-M3 cortex): 7/7 -  Supraganglionic infarction (M4-M6 cortex): 3/3 Total score (0-10 with 10 being normal): 10/10 IMPRESSION: 1. Negative CT of the head. 2. ASPECTS is 10/10 The above was relayed via text pager to Dr. Leonel Ramsay on 06/14/2019 at 07:32 . Electronically Signed   By: San Morelle M.D.   On: 06/14/2019 07:32    Procedures .Critical Care Performed by: Sherwood Gambler, MD Authorized by: Sherwood Gambler, MD   Critical care provider statement:    Critical care time (minutes):  30   Critical care was necessary to treat or prevent imminent or life-threatening deterioration of the following conditions:  CNS failure or compromise   Critical care was time spent personally by me on the following activities:  Discussions with consultants, evaluation of patient's response to treatment, examination of patient, ordering and performing treatments and interventions, ordering and review of laboratory studies, ordering and review of radiographic studies, pulse oximetry, re-evaluation of patient's condition, obtaining history from patient or surrogate and review of old charts   (including critical care time)  Medications Ordered in ED Medications   stroke: mapping our early stages of recovery book (has no administration in time range)  0.9 %  sodium chloride infusion (has no administration in time range)  acetaminophen (TYLENOL) tablet 650 mg (has no administration in time range)  Or  acetaminophen (TYLENOL) 160 MG/5ML solution 650 mg (has no administration in time range)    Or  acetaminophen (TYLENOL) suppository 650 mg (has no administration in time range)  labetalol (NORMODYNE) injection 10 mg (has no administration in time range)    And  nicardipine (CARDENE) 20mg  in 0.86% saline 264ml IV infusion (0.1 mg/ml) (has no administration in time range)  pantoprazole (PROTONIX) injection 40 mg (has no administration in time range)  alteplase (ACTIVASE) 1 mg/mL infusion 69 mg (69 mg Intravenous New Bag/Given  06/14/19 0817)    Followed by  0.9 %  sodium chloride infusion (has no administration in time range)  potassium chloride 10 mEq in 100 mL IVPB (has no administration in time range)  iohexol (OMNIPAQUE) 350 MG/ML injection 100 mL (100 mLs Intravenous Contrast Given 06/14/19 0745)    ED Course  I have reviewed the triage vital signs and the nursing notes.  Pertinent labs & imaging results that were available during my care of the patient were reviewed by me and considered in my medical decision making (see chart for details).    MDM Rules/Calculators/A&P                      Patient presents as acute code stroke.  With dense hemiparesis and speech problems, concern for possible large vessel occlusion.  This is not seen on CTA.  Taken for emergent MRI and based on those criteria with wake-up stroke, neurology has decided to give TPA.  Blood pressure within limits for this.  Airway has remained stable.  She will go to the ICU. Final Clinical Impression(s) / ED Diagnoses Final diagnoses:  Acute ischemic stroke [facility])    Rx / DC Orders ED Discharge Orders    None       Sherwood Gambler, MD 06/14/19 0830

## 2019-06-14 NOTE — Evaluation (Signed)
Clinical/Bedside Swallow Evaluation Patient Details  Name: Megan Martinez MRN: [id number] Date of Birth: 02/04/1951  Today's Date: 06/14/2019 Time: SLP Start Time (ACUTE ONLY): [id number] SLP Stop Time (ACUTE ONLY): 1430 SLP Time Calculation (min) (ACUTE ONLY): 13 min  Past Medical History:  Past Medical History:  Diagnosis Date  . Allergy   . Arthritis   . Hypertension   . Thyroid disease    Past Surgical History:  Past Surgical History:  Procedure Laterality Date  . ABDOMINAL HYSTERECTOMY    . APPENDECTOMY    . CATARACT EXTRACTION    . CHOLECYSTECTOMY    . TUBAL LIGATION     HPI:   69 y.o. female with a history of hypertension admitted with right sided ataxia and facial asymmetry; received IV TPA.  Dx acute left paramedian pontine CVA.    Assessment / Plan / Recommendation Clinical Impression  Pt presents with a neurogenic dysphagia marked by clinical s/s of aspiration with thin liquids, mild oral residue right lateral sulcus post-swallow, occasional spillage right side.  Oral mechanism exam reveals mild right facial asymmetry; intermittent sensation deficits on right per pt description (CN V), difficulty with tongue extension beyond bottom teeth and with overal lingual control.  Recommend initiating a dysphagia 3 diet with nectar-thick liquids; MBS next date.  D/W pt, her partner [name], and Therapist, sports.  SLP Visit Diagnosis: Dysphagia, oropharyngeal phase (R13.12)    Aspiration Risk       Diet Recommendation   dys3, nectar liquids  Medication Administration: Crushed with puree    Other  Recommendations Oral Care Recommendations: Oral care BID Other Recommendations: Order thickener from pharmacy   Follow up Recommendations Inpatient Rehab      Frequency and Duration            Prognosis        Swallow Study   General Date of Onset: 06/14/19 HPI:  69 y.o. female with a history of hypertension admitted with right sided ataxia and facial asymmetry; received IV TPA.  Dx  acute left paramedian pontine CVA.  Type of Study: Bedside Swallow Evaluation Previous Swallow Assessment: no Diet Prior to this Study: NPO Temperature Spikes Noted: No Respiratory Status: Room air History of Recent Intubation: No Behavior/Cognition: Alert;Cooperative Oral Cavity Assessment: Within Functional Limits Oral Care Completed by SLP: No Oral Cavity - Dentition: Dentures, top Vision: Functional for self-feeding Self-Feeding Abilities: Able to feed self;Needs assist Patient Positioning: Upright in bed Baseline Vocal Quality: Normal Volitional Cough: Strong Volitional Swallow: Able to elicit    Oral/Motor/Sensory Function Overall Oral Motor/Sensory Function: Moderate impairment Facial ROM: Reduced right;Suspected CN VII (facial) dysfunction Facial Symmetry: Abnormal symmetry right;Suspected CN VII (facial) dysfunction Facial Sensation: Within Functional Limits Lingual ROM: Suspected CN XII (hypoglossal) dysfunction Lingual Symmetry: Suspected CN XII (hypoglossal) dysfunction Velum: Other (comment)(decreased elevation)   Ice Chips Ice chips: Impaired Presentation: Spoon Pharyngeal Phase Impairments: Cough - Immediate   Thin Liquid Thin Liquid: Impaired Presentation: Cup;Self Fed Oral Phase Functional Implications: Right anterior spillage Pharyngeal  Phase Impairments: Cough - Immediate    Nectar Thick Nectar Thick Liquid: Impaired Presentation: Cup Oral phase functional implications: Right anterior spillage   Honey Thick Honey Thick Liquid: Not tested   Puree Puree: Impaired Presentation: Self Fed Oral Phase Functional Implications: Oral residue   Solid     Solid: Impaired Oral Phase Functional Implications: Oral residue      Juan Quam Laurice 06/14/2019,3:04 PM  Hurshel Bouillon L. Tivis Ringer, [place] Office number [phone] Pager [phone]

## 2019-06-14 NOTE — ED Notes (Signed)
Got patient undress on the monitor did ekg shown to Dr Regenia Skeeter patient is resting with nurses at bedside

## 2019-06-14 NOTE — H&P (Signed)
Neurology H&P Reason for Consult: Right sided weakness Referring Physician: Sherwood Gambler  CC: Right Sided weakness  History is obtained from:patient  HPI: Megan Martinez is a 69 y.o. female with a history of hypertension who was in her normal state of health until she awoke on 1/21. She was last in her normal state of health until she went to bed around 9:30 PM.  When she awoke at "5:44 AM" she noticed that she was having problems with her right side.  She called her boyfriend who came over and decided to dial 911.  She was brought to the emergency department as a code stroke.  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  Due to the density of her deficits, she was considered for treatment based on the WAKE-UP trial and taken for emergent MRI.  This did confirm a DWI-flair mismatch and therefore I discussed both the typical risks of IV TPA as well as the fact that I would be treating based on this study which was suggestive that people do better when they got the medication versus when they did not.  She agreed with proceeding with IV TPA.   LKW: 9:30  tpa given?: no, out of window Premorbid modified rankin scale: 0   ROS: A 14 point ROS was performed and is negative except as noted in the HPI.   Past Medical History:  Diagnosis Date  . Allergy   . Arthritis   . Hypertension   . Thyroid disease      FHx: No hx stroke  Social History:  reports that she quit smoking about 51 years ago. She has never used smokeless tobacco. She reports current alcohol use. She reports that she does not use drugs.   Exam: Current vital signs: There were no vitals taken for this visit. Vital signs in last 24 hours:     Physical Exam  Constitutional: Appears well-developed and well-nourished.  Psych: Affect appropriate to situation Eyes: No scleral injection HENT: No OP obstrucion MSK: no joint deformities.  Cardiovascular: Normal rate  and regular rhythm.  Respiratory: Effort normal, non-labored breathing GI: Soft.  No distension. There is no tenderness.  Skin: WDI  Neuro: Mental Status: Patient is awake, alert, oriented to person, place, month, year, and situation. Patient is able to give a clear and coherent history. No signs of aphasia or neglect She is severely dysarthric Cranial Nerves: II: Visual Fields are full. Pupils are equal, round, and reactive to light.   III,IV, VI: EOMI without ptosis or diploplia.  V: Facial sensation is symmetric to temperature VII: Facial movement with mild right facial weakness VIII: hearing is intact to voice X: Uvula elevates symmetrically XI: Shoulder shrug is symmetric. XII: tongue is midline without atrophy or fasciculations.  Motor: Tone is normal. Bulk is normal. 5/5 strength was present on the left, on the right she had no movement in the right arm and minimal effort versus gravity in the right leg Sensory: Sensation is diminished throughout the right side Cerebellar: She is unable to perform on the right, no clear ataxia on the left   I have reviewed labs in epic and the results pertinent to this consultation are: Hypokalemia with a potassium of 3.0  I have reviewed the images obtained: CT/CTA/CTP-diminutive posterior circulation with stenoses without occlusion MRI-DWI/flair mismatch  Impression: 69 year old female with acute pontine infarct.  Though outside of the typical time window for IV TPA, she was eligible for treatment based on  the WAKE-UP criteria.  She has received IV TPA after treatment options were discussed.  She is going to be admitted to ICU for close monitoring.  Recommendations:  Stroke- - HgbA1c, fasting lipid panel - Frequent neuro checks - Echocardiogram - Prophylactic therapy-None for 24 hours.  - Risk factor modification - Telemetry monitoring - PT consult, OT consult, Speech consult - Stroke team to follow  Essential Hypertension- -  IV meds PRN for first 24 hours.   Hypokalemia- - 40 meQ IV over 4 hours.   This patient is critically ill and at significant risk of neurological worsening, death and care requires constant monitoring of vital signs, hemodynamics,respiratory and cardiac monitoring, neurological assessment, discussion with family, other specialists and medical decision making of high complexity. I spent 40 minutes of neurocritical care time  in the care of  this patient. This was time spent independent of any time provided by nurse practitioner or PA.  Roland Rack, MD [facility] [phone]  If 7pm- 7am, please page neurology on call as listed in [place]. 06/14/2019  8:31 AM

## 2019-06-14 NOTE — Progress Notes (Signed)
  Echocardiogram 2D Echocardiogram has been performed.  Megan Martinez 06/14/2019, 12:34 PM

## 2019-06-14 NOTE — Progress Notes (Signed)
STROKE TEAM PROGRESS NOTE   INTERVAL HISTORY I personally reviewed history of presenting illness with the patient in details, electronic medical records and imaging films in PACS.  She was last seen normal last night but woke up at 5 in the morning with right-sided weakness.  She presented beyond time window for traditional IV TPA but MRI scan showed diffusion/FLAIR mismatch and she was given TPA outside traditional window.  She continues to have significant dysarthria, right facial weakness and right hemiparesis.  Blood pressure adequately controlled.  Vitals:   06/14/19 0845 06/14/19 0852 06/14/19 0900 06/14/19 0915  BP: (!) 146/76 (!) 132/58 135/78 136/72  Pulse: 91 80 77   Resp: (!) 30 (!) 24 16   Temp:      TempSrc:      SpO2:  94% 95%   Weight:        CBC:  Recent Labs  Lab 06/14/19 0719 06/14/19 0725  WBC 5.4  --   NEUTROABS 3.2  --   HGB 14.6 13.9  HCT 43.9 41.0  MCV 86.6  --   PLT 295  --     Basic Metabolic Panel:  Recent Labs  Lab 06/14/19 0719 06/14/19 0725  NA 139 139  K 3.0* 3.1*  CL 101 99  CO2 25  --   GLUCOSE 152* 153*  BUN 12 14  CREATININE 0.81 0.80  CALCIUM 9.4  --    Lipid Panel: No results found for: CHOL, TRIG, HDL, CHOLHDL, VLDL, LDLCALC HgbA1c: No results found for: HGBA1C Urine Drug Screen: No results found for: LABOPIA, COCAINSCRNUR, LABBENZ, AMPHETMU, THCU, LABBARB  Alcohol Level     Component Value Date/Time   ETH <10 06/14/2019 0719    IMAGING past 48 hours CT Code Stroke CTA Head W/WO contrast  Result Date: 06/14/2019 CLINICAL DATA:  Ataxia.  Facial droop. EXAM: CT ANGIOGRAPHY HEAD AND NECK CT PERFUSION BRAIN TECHNIQUE: Multidetector CT imaging of the head and neck was performed using the standard protocol during bolus administration of intravenous contrast. Multiplanar CT image reconstructions and MIPs were obtained to evaluate the vascular anatomy. Carotid stenosis measurements (when applicable) are obtained utilizing NASCET  criteria, using the distal internal carotid diameter as the denominator. Multiphase CT imaging of the brain was performed following IV bolus contrast injection. Subsequent parametric perfusion maps were calculated using RAPID software. CONTRAST:  165mL OMNIPAQUE IOHEXOL 350 MG/ML SOLN COMPARISON:  CT head without contrast of the same day. FINDINGS: CTA NECK FINDINGS Aortic arch: There is common origin of the left common carotid artery and the innominate artery. Aortic arch and great vessel origins are within normal limits otherwise. No atherosclerotic change, stenosis, or aneurysm is present. Right carotid system: The right common carotid artery is within normal limits. Bifurcation is unremarkable. Cervical right ICA is normal. Left carotid system: The left common carotid artery is within normal limits. Mild atherosclerotic changes are noted in the proximal left ICA without stenosis significant stenosis relative to the more distal vessel. Vertebral arteries: The left vertebral artery is the dominant vessel. Both vertebral arteries originate from the subclavian arteries without significant stenosis. There is no significant stenosis of either vertebral artery in the neck. Skeleton: Degenerative anterolisthesis present C3-4 and C4-5. Endplate changes contribute to mild foraminal narrowing bilaterally at C5-6. No focal lytic or blastic lesions are present. Other neck: Neck soft tissues are otherwise within normal limits. Upper chest: Lung apices are clear. Thoracic inlet is within normal limits. Review of the MIP images confirms the above findings  CTA HEAD FINDINGS Anterior circulation: Atherosclerotic changes are present within the cavernous internal carotid arteries bilaterally without significant stenosis through the ICA termini. Left posterior communicating artery is noted. The A1 and M1 segments are normal. Left A1 segment is dominant. The anterior communicating artery is patent. MCA bifurcations are within normal  limits bilaterally. The ACA and MCA branch vessels are within normal limits. Posterior circulation: Atherosclerotic calcifications are present at the dural margin of the left vertebral artery. 50% stenosis is present. Is some narrowing of distal vertebral arteries scratched at there is some narrowing of distal V4 segments bilaterally. The proximal basilar artery is small. A high-grade mid basilar stenosis is present at the level of the pons. There is flow in the distal basilar artery. A left posterior communicating artery contributes. Moderate proximal PCA stenoses are present bilaterally. There is attenuation of distal PCA branch vessels, right greater than left. Venous sinuses: The dural sinuses are patent. The straight sinus deep cerebral veins are intact. Cortical veins are unremarkable. No vascular malformations are present. Anatomic variants: Prominent left posterior communicating artery. Review of the MIP images confirms the above findings CT Brain Perfusion Findings: ASPECTS: 10/10 CBF (<30%) Volume: 2mL Perfusion (Tmax>6.0s) volume: 55mL. There is some indication of ischemia in the cerebellum when a shorter T-max is used. Mismatch Volume: 63mL Infarction Location:Posterior fossa ischemia IMPRESSION: 1. 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 2. Moderate proximal PCA stenoses bilaterally. 3. 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 4. CT perfusion demonstrates no anterior circulation infarct or ischemia. 5. Question ischemia within the cerebellum. 6. Atherosclerotic changes at the left proximal ICA and bilateral cavernous internal carotid arteries without significant stenosis in the anterior circulation. These results were called by telephone at the time of interpretation on 06/14/2019 at 7:58 am to provider MCNEILL [facility] , who verbally acknowledged these results. Electronically Signed   By: San Morelle M.D.   On:  06/14/2019 08:13   CT Code Stroke CTA Neck W/WO contrast  Result Date: 06/14/2019 CLINICAL DATA:  Ataxia.  Facial droop. EXAM: CT ANGIOGRAPHY HEAD AND NECK CT PERFUSION BRAIN TECHNIQUE: Multidetector CT imaging of the head and neck was performed using the standard protocol during bolus administration of intravenous contrast. Multiplanar CT image reconstructions and MIPs were obtained to evaluate the vascular anatomy. Carotid stenosis measurements (when applicable) are obtained utilizing NASCET criteria, using the distal internal carotid diameter as the denominator. Multiphase CT imaging of the brain was performed following IV bolus contrast injection. Subsequent parametric perfusion maps were calculated using RAPID software. CONTRAST:  164mL OMNIPAQUE IOHEXOL 350 MG/ML SOLN COMPARISON:  CT head without contrast of the same day. FINDINGS: CTA NECK FINDINGS Aortic arch: There is common origin of the left common carotid artery and the innominate artery. Aortic arch and great vessel origins are within normal limits otherwise. No atherosclerotic change, stenosis, or aneurysm is present. Right carotid system: The right common carotid artery is within normal limits. Bifurcation is unremarkable. Cervical right ICA is normal. Left carotid system: The left common carotid artery is within normal limits. Mild atherosclerotic changes are noted in the proximal left ICA without stenosis significant stenosis relative to the more distal vessel. Vertebral arteries: The left vertebral artery is the dominant vessel. Both vertebral arteries originate from the subclavian arteries without significant stenosis. There is no significant stenosis of either vertebral artery in the neck. Skeleton: Degenerative anterolisthesis present C3-4 and C4-5. Endplate changes contribute to  mild foraminal narrowing bilaterally at C5-6. No focal lytic or blastic lesions are present. Other neck: Neck soft tissues are otherwise within normal limits. Upper  chest: Lung apices are clear. Thoracic inlet is within normal limits. Review of the MIP images confirms the above findings CTA HEAD FINDINGS Anterior circulation: Atherosclerotic changes are present within the cavernous internal carotid arteries bilaterally without significant stenosis through the ICA termini. Left posterior communicating artery is noted. The A1 and M1 segments are normal. Left A1 segment is dominant. The anterior communicating artery is patent. MCA bifurcations are within normal limits bilaterally. The ACA and MCA branch vessels are within normal limits. Posterior circulation: Atherosclerotic calcifications are present at the dural margin of the left vertebral artery. 50% stenosis is present. Is some narrowing of distal vertebral arteries scratched at there is some narrowing of distal V4 segments bilaterally. The proximal basilar artery is small. A high-grade mid basilar stenosis is present at the level of the pons. There is flow in the distal basilar artery. A left posterior communicating artery contributes. Moderate proximal PCA stenoses are present bilaterally. There is attenuation of distal PCA branch vessels, right greater than left. Venous sinuses: The dural sinuses are patent. The straight sinus deep cerebral veins are intact. Cortical veins are unremarkable. No vascular malformations are present. Anatomic variants: Prominent left posterior communicating artery. Review of the MIP images confirms the above findings CT Brain Perfusion Findings: ASPECTS: 10/10 CBF (<30%) Volume: 28mL Perfusion (Tmax>6.0s) volume: 65mL. There is some indication of ischemia in the cerebellum when a shorter T-max is used. Mismatch Volume: 32mL Infarction Location:Posterior fossa ischemia IMPRESSION: 1. 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 2. Moderate proximal PCA stenoses bilaterally. 3. 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 4. CT  perfusion demonstrates no anterior circulation infarct or ischemia. 5. Question ischemia within the cerebellum. 6. Atherosclerotic changes at the left proximal ICA and bilateral cavernous internal carotid arteries without significant stenosis in the anterior circulation. These results were called by telephone at the time of interpretation on 06/14/2019 at 7:58 am to provider MCNEILL [facility] , who verbally acknowledged these results. Electronically Signed   By: San Morelle M.D.   On: 06/14/2019 08:13   MR BRAIN WO CONTRAST  Result Date: 06/14/2019 CLINICAL DATA:  Wake up stroke. Ataxia and facial droop. EXAM: MRI HEAD WITHOUT CONTRAST TECHNIQUE: Multiplanar, multiecho pulse sequences of the brain and surrounding structures were obtained without intravenous contrast. COMPARISON:  CT head, CTA, and CT perfusion 06/14/2019 FINDINGS: Brain: Diffusion-weighted images demonstrate restricted diffusion in the left paramedian pons. No significant T2 or FLAIR signal is associated. There may be some diffusion abnormality in the midbrain. No acute hemorrhage is present. Minimal white matter disease is present otherwise, likely within normal limits for age. The ventricles are of normal size. No significant extraaxial fluid collection is present. Vascular: Abnormal flow signal is present in mid basilar artery consistent with known high-grade stenosis. Skull and upper cervical spine: The craniocervical junction is normal. Upper cervical spine is within normal limits. Marrow signal is unremarkable. Sinuses/Orbits: The paranasal sinuses and mastoid air cells are clear. Bilateral lens replacements are noted. Globes and orbits are otherwise unremarkable. IMPRESSION: 1. 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 2. 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 3. No other acute intracranial abnormality. These results were called by telephone at the time of  interpretation on 06/14/2019 at 8:07am to  provider MCNEILL [facility] , who verbally acknowledged these results. Electronically Signed   By: San Morelle M.D.   On: 06/14/2019 08:17   CT Code Stroke Cerebral Perfusion with contrast  Result Date: 06/14/2019 CLINICAL DATA:  Ataxia.  Facial droop. EXAM: CT ANGIOGRAPHY HEAD AND NECK CT PERFUSION BRAIN TECHNIQUE: Multidetector CT imaging of the head and neck was performed using the standard protocol during bolus administration of intravenous contrast. Multiplanar CT image reconstructions and MIPs were obtained to evaluate the vascular anatomy. Carotid stenosis measurements (when applicable) are obtained utilizing NASCET criteria, using the distal internal carotid diameter as the denominator. Multiphase CT imaging of the brain was performed following IV bolus contrast injection. Subsequent parametric perfusion maps were calculated using RAPID software. CONTRAST:  120mL OMNIPAQUE IOHEXOL 350 MG/ML SOLN COMPARISON:  CT head without contrast of the same day. FINDINGS: CTA NECK FINDINGS Aortic arch: There is common origin of the left common carotid artery and the innominate artery. Aortic arch and great vessel origins are within normal limits otherwise. No atherosclerotic change, stenosis, or aneurysm is present. Right carotid system: The right common carotid artery is within normal limits. Bifurcation is unremarkable. Cervical right ICA is normal. Left carotid system: The left common carotid artery is within normal limits. Mild atherosclerotic changes are noted in the proximal left ICA without stenosis significant stenosis relative to the more distal vessel. Vertebral arteries: The left vertebral artery is the dominant vessel. Both vertebral arteries originate from the subclavian arteries without significant stenosis. There is no significant stenosis of either vertebral artery in the neck. Skeleton: Degenerative anterolisthesis present C3-4 and C4-5. Endplate  changes contribute to mild foraminal narrowing bilaterally at C5-6. No focal lytic or blastic lesions are present. Other neck: Neck soft tissues are otherwise within normal limits. Upper chest: Lung apices are clear. Thoracic inlet is within normal limits. Review of the MIP images confirms the above findings CTA HEAD FINDINGS Anterior circulation: Atherosclerotic changes are present within the cavernous internal carotid arteries bilaterally without significant stenosis through the ICA termini. Left posterior communicating artery is noted. The A1 and M1 segments are normal. Left A1 segment is dominant. The anterior communicating artery is patent. MCA bifurcations are within normal limits bilaterally. The ACA and MCA branch vessels are within normal limits. Posterior circulation: Atherosclerotic calcifications are present at the dural margin of the left vertebral artery. 50% stenosis is present. Is some narrowing of distal vertebral arteries scratched at there is some narrowing of distal V4 segments bilaterally. The proximal basilar artery is small. A high-grade mid basilar stenosis is present at the level of the pons. There is flow in the distal basilar artery. A left posterior communicating artery contributes. Moderate proximal PCA stenoses are present bilaterally. There is attenuation of distal PCA branch vessels, right greater than left. Venous sinuses: The dural sinuses are patent. The straight sinus deep cerebral veins are intact. Cortical veins are unremarkable. No vascular malformations are present. Anatomic variants: Prominent left posterior communicating artery. Review of the MIP images confirms the above findings CT Brain Perfusion Findings: ASPECTS: 10/10 CBF (<30%) Volume: 73mL Perfusion (Tmax>6.0s) volume: 77mL. There is some indication of ischemia in the cerebellum when a shorter T-max is used. Mismatch Volume: 57mL Infarction Location:Posterior fossa ischemia IMPRESSION: 1. High-grade stenosis of the mid  basilar artery. 2. Moderate proximal PCA stenoses bilaterally. 3. 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 4. CT perfusion demonstrates no anterior circulation infarct or  ischemia. 5. Question ischemia within the cerebellum. 6. Atherosclerotic changes at the left proximal ICA and bilateral cavernous internal carotid arteries without significant stenosis in the anterior circulation. These results were called by telephone at the time of interpretation on 06/14/2019 at 7:58 am to provider MCNEILL [facility] , who verbally acknowledged these results. Electronically Signed   By: San Morelle M.D.   On: 06/14/2019 08:13   CT HEAD CODE STROKE WO CONTRAST  Result Date: 06/14/2019 CLINICAL DATA:  Code stroke. Ataxia. Facial droop and slurred speech. Last seen normal at 9:30 p.m. last night. EXAM: CT HEAD WITHOUT CONTRAST TECHNIQUE: Contiguous axial images were obtained from the base of the skull through the vertex without intravenous contrast. COMPARISON:  None. FINDINGS: Brain: No acute infarct, hemorrhage, or mass lesion is present. The ventricles are of normal size. No significant white matter lesions are present. No significant extraaxial fluid collection is present. Vascular: Atherosclerotic calcifications are present within the cavernous internal carotid arteries bilaterally. There is no hyperdense vessel. Skull: Calvarium is intact. No focal lytic or blastic lesions are present. No significant extracranial soft tissue lesion is present. Sinuses/Orbits: The paranasal sinuses and mastoid air cells are clear. Bilateral lens replacements are noted. Globes and orbits are otherwise unremarkable. ASPECTS [facility] Stroke Program Early CT Score) - Ganglionic level infarction (caudate, lentiform nuclei, internal capsule, insula, M1-M3 cortex): 7/7 - Supraganglionic infarction (M4-M6 cortex): 3/3 Total score (0-10 with 10 being normal): 10/10  IMPRESSION: 1. Negative CT of the head. 2. ASPECTS is 10/10 The above was relayed via text pager to Dr. Leonel Ramsay on 06/14/2019 at 07:32 . Electronically Signed   By: San Morelle M.D.   On: 06/14/2019 07:32    PHYSICAL EXAM Pleasant middle-aged Caucasian lady not in distress. . Afebrile. Head is nontraumatic. Neck is supple without bruit.    Cardiac exam no murmur or gallop. Lungs are clear to auscultation. Distal pulses are well felt. Neurological Exam :  She is awake alert oriented to time place and person.  Speech is severely dysarthric but can be understood.  She follows commands well.  Extraocular movements are full range but there is saccadic dysmetria on the right lateral gaze.  She has moderate right lower facial weakness.  Tongue is midline she has very weak cough and gag.  Motor system exam shows increased tone on the right side with right hemiplegia right upper extremity 1/5 and right lower extremity 2/5 strength.  Reflexes brisk bilaterally right plantar upgoing left downgoing.  Sensation is preserved bilaterally.  Gait not tested. NIH stroke scale is 9 ASSESSMENT/PLAN Ms. DEVION OKONSKI is a 69 y.o. female with history of HTN and thyroid disease presenting with R sided weakness and dysarthria. Received tPA 06/14/2019 at 0817.  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 ASPECTS 10.    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 Moderate B proximal PCA stenoses. L VA 50% stenosis w/ narrowing distal  V4. ? Cerebellar ischemia.   CT perfusion no infarct   MRI  L paramedian pontine infarct. High-grade mid BA stenosis.   CT at 24h pending    2D Echo pending   LDL pending   HgbA1c pending   SCDs for VTE prophylaxis    Diet   Diet NPO time specified     aspirin 81 mg daily prior to admission, now on  No antithrombotic  as within 24h of tPA administration  Therapy recommendations:  pending   Disposition:  pending   Hypertension  Home meds:  norvasc 10, cadesartan-HCTZ 32-25  BP goal per post tPA protocol x 24h following tPA administration . Long-term BP goal 130-150 given BA stenosis   Dysphagia  . Secondary to stroke . Keep NPO for now . Given stroke location, will have SLP assess initially . Speech on board   Other Stroke Risk Factors  Advanced age  Former Cigarette smoker, quit 51 yrs ago  ETOH use, alcohol level <10, advised to drink no more than 1 drink(s) a day  Overweight,  Body mass index is 29.01 kg/m., recommend weight loss, diet and exercise as appropriate   Other Active Problems  Hypokalemia 3.0-3.1 - supplement -   Hypothyroid on synthroid  Hyperglycemia 153-153  Hospital day # 0  I have personally obtained history,examined this patient, reviewed notes, independently viewed imaging studies, participated in medical decision making and plan of care.ROS completed by me personally and pertinent positives fully documented  I have made any additions or clarifications directly to the above note.  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  She presented beyond traditional IV TPA window but due to MRI diffusion/flair mismatch was given IV TPA.  Recommend close neurological monitoring in the ICU and strict blood pressure control as per post TPA protocol.  Continue ongoing stroke work-up and check echocardiogram, lipid profile hemoglobin A1c.  Keep n.p.o. for now and speech therapy for swallow eval.  Discussed with patient and Dr. Leonel Ramsay This patient is critically ill and at significant risk of neurological worsening, death and care requires constant monitoring of vital signs, hemodynamics,respiratory and cardiac monitoring, extensive review of multiple databases, frequent  neurological assessment, discussion with family, other specialists and medical decision making of high complexity.I have made any additions or clarifications directly to the above note.This critical care time does not reflect procedure time, or teaching time or supervisory time of PA/NP/Med Resident etc but could involve care discussion time.  I spent 40 minutes of neurocritical care time  in the care of  this patient.      Antony Contras, MD Medical Director [place] Pager: [phone] 06/14/2019 3:22 PM   To contact Stroke Continuity provider, please refer to [email or web address] After hours, contact General Neurology

## 2019-06-14 NOTE — Code Documentation (Signed)
Arrived to patient's bedside at 0815. Pt was initially a Code Stroke and arrived at [facility] ED at [phone]. Pt lives by herself and went to bed last night at 2130 at baseline. Per Pt, she woke up this morning at 0545 and was unable to move her right arm and significant weakness in her right leg. Pt had slurred speech. She called her boyfriend due to her phone being on her bedside and the boyfriend called EMS. EMS activated a Code Stroke. MD Leonel Ramsay met patient upon arrival to the ED. CT, CTA/CTP completed and showed no signs of LVO. Pt taken to MRI. After MRI completed, MD Leonel Ramsay order tPA based off of WakeUp Stroke Trial. Upon arrival to the patient's bedside, tPA was being started at 0817 by Nicholes Rough, RN. 6.9 mg bolus given with remaining 62.2 mg given over one hour. See initial NIHSS and VS documentation per chart. Assessments completed per protocol. Pt transferred to [place] and report given to [place], Therapist, sports. Pt reported right jaw pain noted prior to the starting of tPA - 8/10. MD Leonie Man at the bedside and made aware.

## 2019-06-14 NOTE — Progress Notes (Signed)
PHARMACIST CODE STROKE RESPONSE  Notified to mix tPA at 0808 by Dr. Leonel Ramsay Delivered tPA to RN at [phone]  tPA dose = 6.9 mg bolus over 1 minute followed by 62.2 mg for a total dose of 69.1 mg over 1 hour  Issues/delays encountered (if applicable): Called to MRI, mixed tPA in MRI and pt transported back to ED, walked dose to ED to deliver to RN.  Await BP results to start infusion.  Bertis Ruddy 06/14/19 8:23 AM

## 2019-06-15 ENCOUNTER — Inpatient Hospital Stay (HOSPITAL_COMMUNITY): Payer: PPO

## 2019-06-15 ENCOUNTER — Other Ambulatory Visit: Payer: Self-pay

## 2019-06-15 LAB — BASIC METABOLIC PANEL
Anion gap: 8 (ref 5–15)
BUN: 9 mg/dL (ref 8–23)
CO2: 25 mmol/L (ref 22–32)
Calcium: 8.9 mg/dL (ref 8.9–10.3)
Chloride: 106 mmol/L (ref 98–111)
Creatinine, Ser: 0.9 mg/dL (ref 0.44–1.00)
GFR calc Af Amer: >60 mL/min (ref >60)
GFR calc non Af Amer: >60 mL/min (ref >60)
Glucose, Bld: 125 mg/dL — ABNORMAL HIGH (ref 70–99)
Potassium: 3.3 mmol/L — ABNORMAL LOW (ref 3.5–5.1)
Sodium: 139 mmol/L (ref 135–145)

## 2019-06-15 LAB — LIPID PANEL
Cholesterol: 204 mg/dL — ABNORMAL HIGH (ref 0–200)
HDL: 35 mg/dL — ABNORMAL LOW (ref >40)
LDL Cholesterol: 137 mg/dL — ABNORMAL HIGH (ref 0–99)
Total CHOL/HDL Ratio: 5.8 RATIO
Triglycerides: 161 mg/dL — ABNORMAL HIGH (ref <150)
VLDL: 32 mg/dL (ref 0–40)

## 2019-06-15 LAB — CBC
HCT: 39.7 % (ref 36.0–46.0)
Hemoglobin: 13.6 g/dL (ref 12.0–15.0)
MCH: 29.4 pg (ref 26.0–34.0)
MCHC: 34.3 g/dL (ref 30.0–36.0)
MCV: 85.9 fL (ref 80.0–100.0)
Platelets: 261 10*3/uL (ref 150–400)
RBC: 4.62 MIL/uL (ref 3.87–5.11)
RDW: 13.3 % (ref 11.5–15.5)
WBC: 6.2 10*3/uL (ref 4.0–10.5)
nRBC: 0 % (ref 0.0–0.2)

## 2019-06-15 LAB — HEMOGLOBIN A1C
Hgb A1c MFr Bld: 5.8 % — ABNORMAL HIGH (ref 4.8–5.6)
Mean Plasma Glucose: 119.76 mg/dL

## 2019-06-15 LAB — GLUCOSE, CAPILLARY: Glucose-Capillary: 137 mg/dL — ABNORMAL HIGH (ref 70–99)

## 2019-06-15 MED ORDER — CLOPIDOGREL BISULFATE 75 MG PO TABS
75.0000 mg | ORAL_TABLET | Freq: Every day | ORAL | Status: DC
  Administered 2019-06-16 – 2019-06-20 (×5): 75 mg via ORAL
  Filled 2019-06-15 (×6): qty 1

## 2019-06-15 MED ORDER — PANTOPRAZOLE SODIUM 40 MG PO TBEC
40.0000 mg | DELAYED_RELEASE_TABLET | Freq: Every day | ORAL | Status: DC
  Administered 2019-06-15 – 2019-06-19 (×5): 40 mg via ORAL
  Filled 2019-06-15 (×5): qty 1

## 2019-06-15 MED ORDER — ASPIRIN EC 325 MG PO TBEC
325.0000 mg | DELAYED_RELEASE_TABLET | Freq: Once | ORAL | Status: AC
  Administered 2019-06-15: 325 mg via ORAL
  Filled 2019-06-15: qty 1

## 2019-06-15 MED ORDER — POTASSIUM CHLORIDE 20 MEQ PO PACK
40.0000 meq | PACK | Freq: Once | ORAL | Status: DC
  Filled 2019-06-15: qty 2

## 2019-06-15 MED ORDER — CLOPIDOGREL BISULFATE 75 MG PO TABS
300.0000 mg | ORAL_TABLET | Freq: Every day | ORAL | Status: DC
  Administered 2019-06-15: 300 mg via ORAL
  Filled 2019-06-15 (×4): qty 4

## 2019-06-15 MED ORDER — LEVOTHYROXINE SODIUM 25 MCG PO TABS
137.0000 ug | ORAL_TABLET | Freq: Every day | ORAL | Status: DC
  Administered 2019-06-15 – 2019-06-20 (×6): 137 ug via ORAL
  Filled 2019-06-15 (×6): qty 1

## 2019-06-15 MED ORDER — POTASSIUM CHLORIDE CRYS ER 20 MEQ PO TBCR
40.0000 meq | EXTENDED_RELEASE_TABLET | Freq: Once | ORAL | Status: AC
  Administered 2019-06-15: 40 meq via ORAL
  Filled 2019-06-15: qty 2

## 2019-06-15 MED ORDER — ATORVASTATIN CALCIUM 40 MG PO TABS
40.0000 mg | ORAL_TABLET | Freq: Every day | ORAL | Status: DC
  Administered 2019-06-16: 40 mg via ORAL
  Filled 2019-06-15 (×2): qty 1

## 2019-06-15 MED ORDER — ASPIRIN EC 81 MG PO TBEC
81.0000 mg | DELAYED_RELEASE_TABLET | Freq: Every day | ORAL | Status: DC
  Administered 2019-06-16 – 2019-06-18 (×3): 81 mg via ORAL
  Filled 2019-06-15 (×3): qty 1

## 2019-06-15 NOTE — Progress Notes (Signed)
PT Cancellation Note  Patient Details Name: Megan Martinez MRN: [id number] DOB: 08/03/1950   Cancelled Treatment:    Reason Eval/Treat Not Completed: Active bedrest order   [name] [name] 06/15/2019, 6:45 AM  Bayard Males, PT Acute Rehabilitation Services Pager: [phone] Office: [phone]

## 2019-06-15 NOTE — Progress Notes (Signed)
Rehab Admissions Coordinator Note:  Per PT and SLP recommendation, this patient was screened by Raechel Ache for appropriateness for an Inpatient Acute Rehab Consult.  At this time, we are recommending an Inpatient Rehab consult.  AC will contact MD to request consult order.   Raechel Ache 06/15/2019, 12:59 PM  I can be reached at [phone].

## 2019-06-15 NOTE — Progress Notes (Signed)
Modified Barium Swallow Progress Note  Patient Details  Name: Megan Martinez MRN: [id number] Date of Birth: 05/10/1951  Today's Date: 06/15/2019  Modified Barium Swallow completed.  Full report located under Chart Review in the Imaging Section.  Brief recommendations include the following:  Clinical Impression Patient presents with mild oropharyngeal dysphagia characterized by silent aspiration with thin liquids. Oral phase remarkable for reduced lingual strength resulting in lingual residue. Moments of clearing residue resulted in episodes of impaired timing. This along with reduced sensation resulted in silent aspiration of thin liquids. As pt utilized chin tuck with straw for thin liquids, no penetration/aspiration observed. With pt's motivation and utilization of compensatory strategies, good progress is suspected. Dysphagia 3 diet and thin liquids utilizing chin tuck recommended, with intermittent staff supervision.     Swallow Evaluation Recommendations   SLP Diet Recommendations: Thin liquid;Dysphagia 3 (Mech soft) solids   Liquid Administration via: Straw   Medication Administration: Whole meds with puree   Supervision: Patient able to self feed;Intermittent supervision to cue for compensatory strategies   Compensations: Chin tuck;Use straw to facilitate chin tuck   Postural Changes: Seated upright at 90 degrees   Oral Care Recommendations: Oral care BID   Aline August, Student SLP Office: [phone]  06/15/2019,2:07 PM

## 2019-06-15 NOTE — Consult Note (Signed)
Physical Medicine and Rehabilitation Consult Reason for Consult: Right side weakness and expressive deficits Referring Physician: Dr. Leonie Man   HPI: Megan Martinez is a 69 y.o. right-handed female with history of hypertension..  Per chart review patient independent prior to admission works as a Radio broadcast assistant as well as works part-time at [employer] in [place].  Presented 06/14/2019 with right side weakness and speech difficulty.  Cranial CT scan negative.  Patient did receive TPA.  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  Moderate proximal PCA stenosis bilaterally.  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  Echocardiogram with ejection fraction 65% without emboli.  Admission chemistries with potassium 3.0, urine drug screen negative.  Neurology follow-up maintained on aspirin and Plavix for CVA prophylaxis.  Subcutaneous Lovenox for DVT prophylaxis.  Tolerating mechanical soft diet.  Maintain on Cardene drip for blood pressure control.  Therapy evaluation completed with recommendations of physical medicine rehab consult.  Patient states that she is unable to move her right arm at all.  She does have good feeling.  She denies having problems with finding the right word but has some difficulty with pronunciation.  She indicates she had a good session with speech therapy this morning.  Review of Systems  Constitutional: Negative for chills and fever.  HENT: Negative for hearing loss.   Eyes: Negative for blurred vision and double vision.  Respiratory: Negative for cough and shortness of breath.   Cardiovascular: Negative for chest pain, palpitations and leg swelling.  Gastrointestinal: Positive for constipation. Negative for heartburn and vomiting.  Genitourinary: Negative for dysuria and  flank pain.  Musculoskeletal: Positive for myalgias.  Skin: Negative for rash.  Neurological: Positive for speech change and weakness.  All other systems reviewed and are negative.  Past Medical History:  Diagnosis Date  . Allergy   . Arthritis   . Hypertension   . Thyroid disease    Past Surgical History:  Procedure Laterality Date  . ABDOMINAL HYSTERECTOMY    . APPENDECTOMY    . CATARACT EXTRACTION    . CHOLECYSTECTOMY    . TUBAL LIGATION     History reviewed. No pertinent family history. Social History:  reports that she quit smoking about 51 years ago. She has never used smokeless tobacco. She reports current alcohol use. She reports that she does not use drugs. Allergies:  Allergies  Allergen Reactions  . Latex Itching and Rash  . Statins Other (See Comments)    Myalgias, "sick"   . Penicillin G     Did it involve swelling of the face/tongue/throat, SOB, or low BP? No Did it involve sudden or severe rash/hives, skin peeling, or any reaction on the inside of your mouth or nose? Yes Did you need to seek medical attention at a hospital or doctor's office? N/A When did it last happen?Child If all above answers are "NO", may proceed with cephalosporin use.  Marland Kitchen Amoxicillin Rash    Did it involve swelling of the face/tongue/throat, SOB, or low BP? No Did it involve sudden or severe rash/hives, skin peeling, or any reaction on the inside of your mouth or nose? Yes Did you need to seek medical attention at a hospital or doctor's office? N/A When did it last happen? Child If all above answers are "NO", may proceed with cephalosporin use.  Marland Kitchen  Codeine Nausea Only   Medications Prior to Admission  Medication Sig Dispense Refill  . amLODipine (NORVASC) 10 MG tablet Take 1 tablet by mouth daily.    . fluticasone (FLONASE) 50 MCG/ACT nasal spray Place 2 sprays into both nostrils daily. 16 g 6  . levothyroxine (SYNTHROID) 137 MCG tablet TAKE 1 TABLET BY MOUTH DAILY (Patient  taking differently: Take 137 mcg by mouth daily. ) 30 tablet 3  . Candesartan Cilexetil-HCTZ 32-25 MG TABS TAKE 1 TABLET BY MOUTH DAILY (Patient not taking: Reported on 06/14/2019) 90 each 1  . HYDROcodone-homatropine (HYCODAN) 5-1.5 MG/5ML syrup Take 5 mLs by mouth every 8 (eight) hours as needed for cough. (Patient not taking: Reported on 06/14/2019) 120 mL 0  . levocetirizine (XYZAL) 5 MG tablet TAKE 1 TABLET(5 MG) BY MOUTH EVERY EVENING (Patient not taking: Reported on 06/14/2019) 90 tablet 1  . predniSONE (STERAPRED UNI-PAK 21 TAB) 10 MG (21) TBPK tablet Take as directed on packaging. (Patient not taking: Reported on 06/14/2019) 21 tablet 0  . triamcinolone cream (KENALOG) 0.1 % Apply 1 application topically 2 (two) times daily as needed. (Patient not taking: Reported on 01/03/2019) 450 g 0    Home: Home Living Family/patient expects to be discharged to:: Private residence Living Arrangements: Alone Available Help at Discharge: Friend(s), Available 24 hours/day Type of Home: House Home Access: Stairs to enter Technical brewer of Steps: 2 Home Layout: Two level, Laundry or work area in basement [employer] Shower/Tub: Public librarian, Multimedia programmer: Standard Home Equipment: None  Lives With: Alone  Functional History: Prior Function Level of Independence: Independent Functional Status:  Mobility: Bed Mobility Overal bed mobility: Needs Assistance Bed Mobility: Supine to Sit Supine to sit: Mod assist, HOB elevated General bed mobility comments: mod assist to fully elevate trunk and pivot toward left side of bed Transfers Overall transfer level: Needs assistance Transfers: Sit to/from Stand, Stand Pivot Transfers Sit to Stand: Mod assist, +2 physical assistance Stand pivot transfers: Mod assist, +2 physical assistance General transfer comment: cues for sequence with RUE supported and Rt knee blocked to stand and pivot with sequential small steps to chair. Physical  assist for weight shifting and moving RLE in standing Ambulation/Gait General Gait Details: unable    ADL:    Cognition: Cognition Overall Cognitive Status: Within Functional Limits for tasks assessed Orientation Level: Oriented X4 Cognition Arousal/Alertness: Awake/alert Behavior During Therapy: WFL for tasks assessed/performed Overall Cognitive Status: Within Functional Limits for tasks assessed  Blood pressure (!) 153/73, pulse (!) 57, temperature 98 F (36.7 C), temperature source Oral, resp. rate 20, height 5\' 4"  (1.626 m), weight 76.7 kg, SpO2 94 %. Physical Exam  Nursing note and vitals reviewed. Constitutional: She is oriented to person, place, and time. She appears well-developed and well-nourished.  HENT:  Head: Normocephalic and atraumatic.  Eyes: Pupils are equal, round, and reactive to light. Conjunctivae and EOM are normal.  Cardiovascular: Normal rate, regular rhythm and normal heart sounds.  No murmur heard. Respiratory: Effort normal and breath sounds normal. No respiratory distress. She has no wheezes. She has no rales.  GI: Soft. Bowel sounds are normal. She exhibits no distension. There is no abdominal tenderness.  Neurological: She is alert and oriented to person, place, and time.  Patient with significant dysarthria.  Follows simple commands.  She was able to provide her name and age.  Fair insight and awareness of her deficits.  Skin: Skin is warm and dry.  Psychiatric: She has a normal mood and  affect.    Results for orders placed or performed during the hospital encounter of 06/14/19 (from the past 24 hour(s))  CBC     Status: None   Collection Time: 06/15/19  6:44 AM  Result Value Ref Range   WBC 6.2 4.0 - 10.5 K/uL   RBC 4.62 3.87 - 5.11 MIL/uL   Hemoglobin 13.6 12.0 - 15.0 g/dL   HCT 39.7 36.0 - 46.0 %   MCV 85.9 80.0 - 100.0 fL   MCH 29.4 26.0 - 34.0 pg   MCHC 34.3 30.0 - 36.0 g/dL   RDW 13.3 11.5 - 15.5 %   Platelets 261 150 - 400 K/uL    nRBC 0.0 0.0 - 0.2 %  Hemoglobin A1c     Status: Abnormal   Collection Time: 06/15/19  6:44 AM  Result Value Ref Range   Hgb A1c MFr Bld 5.8 (H) 4.8 - 5.6 %   Mean Plasma Glucose 119.76 mg/dL  Basic metabolic panel     Status: Abnormal   Collection Time: 06/15/19  6:44 AM  Result Value Ref Range   Sodium 139 135 - 145 mmol/L   Potassium 3.3 (L) 3.5 - 5.1 mmol/L   Chloride 106 98 - 111 mmol/L   CO2 25 22 - 32 mmol/L   Glucose, Bld 125 (H) 70 - 99 mg/dL   BUN 9 8 - 23 mg/dL   Creatinine, Ser 0.90 0.44 - 1.00 mg/dL   Calcium 8.9 8.9 - 10.3 mg/dL   GFR calc non Af Amer >60 >60 mL/min   GFR calc Af Amer >60 >60 mL/min   Anion gap 8 5 - 15  Lipid panel     Status: Abnormal   Collection Time: 06/15/19  6:44 AM  Result Value Ref Range   Cholesterol 204 (H) 0 - 200 mg/dL   Triglycerides 161 (H) <150 mg/dL   HDL 35 (L) >40 mg/dL   Total CHOL/HDL Ratio 5.8 RATIO   VLDL 32 0 - 40 mg/dL   LDL Cholesterol 137 (H) 0 - 99 mg/dL  Glucose, capillary     Status: Abnormal   Collection Time: 06/15/19 12:27 PM  Result Value Ref Range   Glucose-Capillary 137 (H) 70 - 99 mg/dL   CT Code Stroke CTA Head W/WO contrast  Result Date: 06/14/2019 CLINICAL DATA:  Ataxia.  Facial droop. EXAM: CT ANGIOGRAPHY HEAD AND NECK CT PERFUSION BRAIN TECHNIQUE: Multidetector CT imaging of the head and neck was performed using the standard protocol during bolus administration of intravenous contrast. Multiplanar CT image reconstructions and MIPs were obtained to evaluate the vascular anatomy. Carotid stenosis measurements (when applicable) are obtained utilizing NASCET criteria, using the distal internal carotid diameter as the denominator. Multiphase CT imaging of the brain was performed following IV bolus contrast injection. Subsequent parametric perfusion maps were calculated using RAPID software. CONTRAST:  177mL OMNIPAQUE IOHEXOL 350 MG/ML SOLN COMPARISON:  CT head without contrast of the same day. FINDINGS: CTA NECK  FINDINGS Aortic arch: There is common origin of the left common carotid artery and the innominate artery. Aortic arch and great vessel origins are within normal limits otherwise. No atherosclerotic change, stenosis, or aneurysm is present. Right carotid system: The right common carotid artery is within normal limits. Bifurcation is unremarkable. Cervical right ICA is normal. Left carotid system: The left common carotid artery is within normal limits. Mild atherosclerotic changes are noted in the proximal left ICA without stenosis significant stenosis relative to the more distal vessel. Vertebral arteries: The left  vertebral artery is the dominant vessel. Both vertebral arteries originate from the subclavian arteries without significant stenosis. There is no significant stenosis of either vertebral artery in the neck. Skeleton: Degenerative anterolisthesis present C3-4 and C4-5. Endplate changes contribute to mild foraminal narrowing bilaterally at C5-6. No focal lytic or blastic lesions are present. Other neck: Neck soft tissues are otherwise within normal limits. Upper chest: Lung apices are clear. Thoracic inlet is within normal limits. Review of the MIP images confirms the above findings CTA HEAD FINDINGS Anterior circulation: Atherosclerotic changes are present within the cavernous internal carotid arteries bilaterally without significant stenosis through the ICA termini. Left posterior communicating artery is noted. The A1 and M1 segments are normal. Left A1 segment is dominant. The anterior communicating artery is patent. MCA bifurcations are within normal limits bilaterally. The ACA and MCA branch vessels are within normal limits. Posterior circulation: Atherosclerotic calcifications are present at the dural margin of the left vertebral artery. 50% stenosis is present. Is some narrowing of distal vertebral arteries scratched at there is some narrowing of distal V4 segments bilaterally. The proximal basilar  artery is small. A high-grade mid basilar stenosis is present at the level of the pons. There is flow in the distal basilar artery. A left posterior communicating artery contributes. Moderate proximal PCA stenoses are present bilaterally. There is attenuation of distal PCA branch vessels, right greater than left. Venous sinuses: The dural sinuses are patent. The straight sinus deep cerebral veins are intact. Cortical veins are unremarkable. No vascular malformations are present. Anatomic variants: Prominent left posterior communicating artery. Review of the MIP images confirms the above findings CT Brain Perfusion Findings: ASPECTS: 10/10 CBF (<30%) Volume: 27mL Perfusion (Tmax>6.0s) volume: 55mL. There is some indication of ischemia in the cerebellum when a shorter T-max is used. Mismatch Volume: 75mL Infarction Location:Posterior fossa ischemia IMPRESSION: 1. 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 2. Moderate proximal PCA stenoses bilaterally. 3. 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 4. CT perfusion demonstrates no anterior circulation infarct or ischemia. 5. Question ischemia within the cerebellum. 6. Atherosclerotic changes at the left proximal ICA and bilateral cavernous internal carotid arteries without significant stenosis in the anterior circulation. These results were called by telephone at the time of interpretation on 06/14/2019 at 7:58 am to provider MCNEILL [facility] , who verbally acknowledged these results. Electronically Signed   By: San Morelle M.D.   On: 06/14/2019 08:13   CT HEAD WO CONTRAST  Result Date: 06/15/2019 CLINICAL DATA:  Follow-up stroke.  24 hours post tPA. EXAM: CT HEAD WITHOUT CONTRAST TECHNIQUE: Contiguous axial images were obtained from the base of the skull through the vertex without intravenous contrast. COMPARISON:  MRI and CT studies done yesterday. FINDINGS: Brain: Extensive  low-density within the left side of the pons. No hemorrhagic transformation. No focal cerebellar finding. Cerebral hemispheres are normal. No hydrocephalus or extra-axial collection. Vascular: There is atherosclerotic calcification of the major vessels at the base of the brain. Skull: Negative Sinuses/Orbits: Clear/normal Other: None IMPRESSION: Low-density throughout the left side of the pons consistent with the acute infarction. No hemorrhagic transformation. No hydrocephalus. Electronically Signed   By: [name] Chimes M.D.   On: 06/15/2019 08:08   CT Code Stroke CTA Neck W/WO contrast  Result Date: 06/14/2019 CLINICAL DATA:  Ataxia.  Facial droop. EXAM: CT ANGIOGRAPHY HEAD AND NECK CT PERFUSION BRAIN TECHNIQUE: Multidetector CT imaging of the head and neck was performed  using the standard protocol during bolus administration of intravenous contrast. Multiplanar CT image reconstructions and MIPs were obtained to evaluate the vascular anatomy. Carotid stenosis measurements (when applicable) are obtained utilizing NASCET criteria, using the distal internal carotid diameter as the denominator. Multiphase CT imaging of the brain was performed following IV bolus contrast injection. Subsequent parametric perfusion maps were calculated using RAPID software. CONTRAST:  136mL OMNIPAQUE IOHEXOL 350 MG/ML SOLN COMPARISON:  CT head without contrast of the same day. FINDINGS: CTA NECK FINDINGS Aortic arch: There is common origin of the left common carotid artery and the innominate artery. Aortic arch and great vessel origins are within normal limits otherwise. No atherosclerotic change, stenosis, or aneurysm is present. Right carotid system: The right common carotid artery is within normal limits. Bifurcation is unremarkable. Cervical right ICA is normal. Left carotid system: The left common carotid artery is within normal limits. Mild atherosclerotic changes are noted in the proximal left ICA without stenosis significant  stenosis relative to the more distal vessel. Vertebral arteries: The left vertebral artery is the dominant vessel. Both vertebral arteries originate from the subclavian arteries without significant stenosis. There is no significant stenosis of either vertebral artery in the neck. Skeleton: Degenerative anterolisthesis present C3-4 and C4-5. Endplate changes contribute to mild foraminal narrowing bilaterally at C5-6. No focal lytic or blastic lesions are present. Other neck: Neck soft tissues are otherwise within normal limits. Upper chest: Lung apices are clear. Thoracic inlet is within normal limits. Review of the MIP images confirms the above findings CTA HEAD FINDINGS Anterior circulation: Atherosclerotic changes are present within the cavernous internal carotid arteries bilaterally without significant stenosis through the ICA termini. Left posterior communicating artery is noted. The A1 and M1 segments are normal. Left A1 segment is dominant. The anterior communicating artery is patent. MCA bifurcations are within normal limits bilaterally. The ACA and MCA branch vessels are within normal limits. Posterior circulation: Atherosclerotic calcifications are present at the dural margin of the left vertebral artery. 50% stenosis is present. Is some narrowing of distal vertebral arteries scratched at there is some narrowing of distal V4 segments bilaterally. The proximal basilar artery is small. A high-grade mid basilar stenosis is present at the level of the pons. There is flow in the distal basilar artery. A left posterior communicating artery contributes. Moderate proximal PCA stenoses are present bilaterally. There is attenuation of distal PCA branch vessels, right greater than left. Venous sinuses: The dural sinuses are patent. The straight sinus deep cerebral veins are intact. Cortical veins are unremarkable. No vascular malformations are present. Anatomic variants: Prominent left posterior communicating artery.  Review of the MIP images confirms the above findings CT Brain Perfusion Findings: ASPECTS: 10/10 CBF (<30%) Volume: 8mL Perfusion (Tmax>6.0s) volume: 75mL. There is some indication of ischemia in the cerebellum when a shorter T-max is used. Mismatch Volume: 36mL Infarction Location:Posterior fossa ischemia IMPRESSION: 1. 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 2. Moderate proximal PCA stenoses bilaterally. 3. 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 4. CT perfusion demonstrates no anterior circulation infarct or ischemia. 5. Question ischemia within the cerebellum. 6. Atherosclerotic changes at the left proximal ICA and bilateral cavernous internal carotid arteries without significant stenosis in the anterior circulation. These results were called by telephone at the time of interpretation on 06/14/2019 at 7:58 am to provider MCNEILL [facility] , who verbally acknowledged these results. Electronically Signed   By: Wynetta Fines.D.  On: 06/14/2019 08:13   MR BRAIN WO CONTRAST  Result Date: 06/14/2019 CLINICAL DATA:  Wake up stroke. Ataxia and facial droop. EXAM: MRI HEAD WITHOUT CONTRAST TECHNIQUE: Multiplanar, multiecho pulse sequences of the brain and surrounding structures were obtained without intravenous contrast. COMPARISON:  CT head, CTA, and CT perfusion 06/14/2019 FINDINGS: Brain: Diffusion-weighted images demonstrate restricted diffusion in the left paramedian pons. No significant T2 or FLAIR signal is associated. There may be some diffusion abnormality in the midbrain. No acute hemorrhage is present. Minimal white matter disease is present otherwise, likely within normal limits for age. The ventricles are of normal size. No significant extraaxial fluid collection is present. Vascular: Abnormal flow signal is present in mid basilar artery consistent with known high-grade stenosis. Skull and upper cervical spine:  The craniocervical junction is normal. Upper cervical spine is within normal limits. Marrow signal is unremarkable. Sinuses/Orbits: The paranasal sinuses and mastoid air cells are clear. Bilateral lens replacements are noted. Globes and orbits are otherwise unremarkable. IMPRESSION: 1. 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 2. 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 3. No other acute intracranial abnormality. These results were called by telephone at the time of interpretation on 06/14/2019 at 8:07am to provider MCNEILL [facility] , who verbally acknowledged these results. Electronically Signed   By: San Morelle M.D.   On: 06/14/2019 08:17   CT Code Stroke Cerebral Perfusion with contrast  Result Date: 06/14/2019 CLINICAL DATA:  Ataxia.  Facial droop. EXAM: CT ANGIOGRAPHY HEAD AND NECK CT PERFUSION BRAIN TECHNIQUE: Multidetector CT imaging of the head and neck was performed using the standard protocol during bolus administration of intravenous contrast. Multiplanar CT image reconstructions and MIPs were obtained to evaluate the vascular anatomy. Carotid stenosis measurements (when applicable) are obtained utilizing NASCET criteria, using the distal internal carotid diameter as the denominator. Multiphase CT imaging of the brain was performed following IV bolus contrast injection. Subsequent parametric perfusion maps were calculated using RAPID software. CONTRAST:  110mL OMNIPAQUE IOHEXOL 350 MG/ML SOLN COMPARISON:  CT head without contrast of the same day. FINDINGS: CTA NECK FINDINGS Aortic arch: There is common origin of the left common carotid artery and the innominate artery. Aortic arch and great vessel origins are within normal limits otherwise. No atherosclerotic change, stenosis, or aneurysm is present. Right carotid system: The right common carotid artery is within normal limits. Bifurcation is unremarkable. Cervical right  ICA is normal. Left carotid system: The left common carotid artery is within normal limits. Mild atherosclerotic changes are noted in the proximal left ICA without stenosis significant stenosis relative to the more distal vessel. Vertebral arteries: The left vertebral artery is the dominant vessel. Both vertebral arteries originate from the subclavian arteries without significant stenosis. There is no significant stenosis of either vertebral artery in the neck. Skeleton: Degenerative anterolisthesis present C3-4 and C4-5. Endplate changes contribute to mild foraminal narrowing bilaterally at C5-6. No focal lytic or blastic lesions are present. Other neck: Neck soft tissues are otherwise within normal limits. Upper chest: Lung apices are clear. Thoracic inlet is within normal limits. Review of the MIP images confirms the above findings CTA HEAD FINDINGS Anterior circulation: Atherosclerotic changes are present within the cavernous internal carotid arteries bilaterally without significant stenosis through the ICA termini. Left posterior communicating artery is noted. The A1 and M1 segments are normal. Left A1 segment is dominant. The anterior communicating artery is patent. MCA bifurcations are within normal limits bilaterally. The ACA and MCA branch  vessels are within normal limits. Posterior circulation: Atherosclerotic calcifications are present at the dural margin of the left vertebral artery. 50% stenosis is present. Is some narrowing of distal vertebral arteries scratched at there is some narrowing of distal V4 segments bilaterally. The proximal basilar artery is small. A high-grade mid basilar stenosis is present at the level of the pons. There is flow in the distal basilar artery. A left posterior communicating artery contributes. Moderate proximal PCA stenoses are present bilaterally. There is attenuation of distal PCA branch vessels, right greater than left. Venous sinuses: The dural sinuses are patent. The  straight sinus deep cerebral veins are intact. Cortical veins are unremarkable. No vascular malformations are present. Anatomic variants: Prominent left posterior communicating artery. Review of the MIP images confirms the above findings CT Brain Perfusion Findings: ASPECTS: 10/10 CBF (<30%) Volume: 35mL Perfusion (Tmax>6.0s) volume: 27mL. There is some indication of ischemia in the cerebellum when a shorter T-max is used. Mismatch Volume: 77mL Infarction Location:Posterior fossa ischemia IMPRESSION: 1. 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 2. Moderate proximal PCA stenoses bilaterally. 3. 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 4. CT perfusion demonstrates no anterior circulation infarct or ischemia. 5. Question ischemia within the cerebellum. 6. Atherosclerotic changes at the left proximal ICA and bilateral cavernous internal carotid arteries without significant stenosis in the anterior circulation. These results were called by telephone at the time of interpretation on 06/14/2019 at 7:58 am to provider MCNEILL [facility] , who verbally acknowledged these results. Electronically Signed   By: San Morelle M.D.   On: 06/14/2019 08:13   ECHOCARDIOGRAM COMPLETE  Result Date: 06/14/2019   ECHOCARDIOGRAM REPORT   Patient Name:   Tifini A Eardley Date of Exam: 06/14/2019 Medical Rec #:  [id number]        Height:       64.0 in Accession #:    [id number]       Weight:       169.0 lb Date of Birth:  1950-10-27         BSA:          1.82 m Patient Age:    5 years         BP:           114/101 mmHg Patient Gender: F                HR:           77 bpm. Exam Location:  Inpatient Procedure: 2D Echo Indications:    stroke 434.91  History:        Patient has no prior history of Echocardiogram examinations.                 Risk Factors:Hypertension and Former Smoker.  Sonographer:    Jannett Celestine RDCS (AE) Referring Phys: [phone] MCNEILL P  KIRKPATRICK  Sonographer Comments: restricted mobility IMPRESSIONS  1. Left ventricular ejection fraction, by visual estimation, is 60 to 65%. The left ventricle has normal function. There is no left ventricular hypertrophy.  2. Left ventricular diastolic parameters are consistent with Grade I diastolic dysfunction (impaired relaxation).  3. The left ventricle has no regional wall motion abnormalities.  4. Global right ventricle has normal systolic function.The right ventricular size is normal. No increase in right ventricular wall thickness.  5. Left atrial size was normal.  6. Right atrial size was normal.  7. Mild mitral annular calcification.  8. The mitral valve is normal in structure. No evidence of mitral valve regurgitation. No evidence of mitral stenosis.  9. The tricuspid valve is normal in structure. Tricuspid valve regurgitation is trivial. 10. The aortic valve is tricuspid. Aortic valve regurgitation is not visualized. No evidence of aortic valve sclerosis or stenosis. 11. The inferior vena cava is normal in size with greater than 50% respiratory variability, suggesting right atrial pressure of 3 mmHg. 12. TR signal is inadequate for assessing pulmonary artery systolic pressure. FINDINGS  Left Ventricle: Left ventricular ejection fraction, by visual estimation, is 60 to 65%. The left ventricle has normal function. The left ventricle has no regional wall motion abnormalities. The left ventricular internal cavity size was the left ventricle is normal in size. There is no left ventricular hypertrophy. Left ventricular diastolic parameters are consistent with Grade I diastolic dysfunction (impaired relaxation). Right Ventricle: The right ventricular size is normal. No increase in right ventricular wall thickness. Global RV systolic function is has normal systolic function. Left Atrium: Left atrial size was normal in size. Right Atrium: Right atrial size was normal in size Pericardium: There is no evidence  of pericardial effusion. Mitral Valve: The mitral valve is normal in structure. Mild mitral annular calcification. No evidence of mitral valve regurgitation. No evidence of mitral valve stenosis by observation. Tricuspid Valve: The tricuspid valve is normal in structure. Tricuspid valve regurgitation is trivial. Aortic Valve: The aortic valve is tricuspid. Aortic valve regurgitation is not visualized. The aortic valve is structurally normal, with no evidence of sclerosis or stenosis. Pulmonic Valve: The pulmonic valve was normal in structure. Pulmonic valve regurgitation is not visualized. Pulmonic regurgitation is not visualized. Aorta: The aortic root is normal in size and structure. Venous: The inferior vena cava is normal in size with greater than 50% respiratory variability, suggesting right atrial pressure of 3 mmHg. IAS/Shunts: No atrial level shunt detected by color flow Doppler.  LEFT VENTRICLE PLAX 2D LVIDd:         3.30 cm  Diastology LVIDs:         1.70 cm  LV e' lateral:   15.20 cm/s LV PW:         1.10 cm  LV E/e' lateral: 4.1 LV IVS:        1.00 cm  LV e' medial:    8.92 cm/s LVOT diam:     1.90 cm  LV E/e' medial:  6.9 LV SV:         36 ml LV SV Index:   18.98 LVOT Area:     2.84 cm  RIGHT VENTRICLE TAPSE (M-mode): 1.9 cm LEFT ATRIUM             Index LA diam:        3.20 cm 1.76 cm/m LA Vol (A2C):   43.7 ml 23.99 ml/m LA Vol (A4C):   41.2 ml 22.62 ml/m LA Biplane Vol: 42.4 ml 23.28 ml/m  AORTIC VALVE LVOT Vmax:   90.10 cm/s LVOT Vmean:  63.600 cm/s LVOT VTI:    0.261 m  AORTA Ao Root diam: 2.90 cm MITRAL VALVE MV Area (PHT): 2.16 cm             SHUNTS MV PHT:        101.79 msec          Systemic VTI:  0.26 m MV Decel Time: 351 msec             Systemic Diam: 1.90 cm MV E velocity:  61.70 cm/s 103 cm/s MV A velocity: 43.30 cm/s 70.3 cm/s MV E/A ratio:  1.42       1.5  Loralie Champagne MD Electronically signed by Loralie Champagne MD Signature Date/Time: 06/14/2019/5:12:56 PM    Final    CT HEAD CODE  STROKE WO CONTRAST  Result Date: 06/14/2019 CLINICAL DATA:  Code stroke. Ataxia. Facial droop and slurred speech. Last seen normal at 9:30 p.m. last night. EXAM: CT HEAD WITHOUT CONTRAST TECHNIQUE: Contiguous axial images were obtained from the base of the skull through the vertex without intravenous contrast. COMPARISON:  None. FINDINGS: Brain: No acute infarct, hemorrhage, or mass lesion is present. The ventricles are of normal size. No significant white matter lesions are present. No significant extraaxial fluid collection is present. Vascular: Atherosclerotic calcifications are present within the cavernous internal carotid arteries bilaterally. There is no hyperdense vessel. Skull: Calvarium is intact. No focal lytic or blastic lesions are present. No significant extracranial soft tissue lesion is present. Sinuses/Orbits: The paranasal sinuses and mastoid air cells are clear. Bilateral lens replacements are noted. Globes and orbits are otherwise unremarkable. ASPECTS [facility] Stroke Program Early CT Score) - Ganglionic level infarction (caudate, lentiform nuclei, internal capsule, insula, M1-M3 cortex): 7/7 - Supraganglionic infarction (M4-M6 cortex): 3/3 Total score (0-10 with 10 being normal): 10/10 IMPRESSION: 1. Negative CT of the head. 2. ASPECTS is 10/10 The above was relayed via text pager to Dr. Leonel Ramsay on 06/14/2019 at 07:32 . Electronically Signed   By: San Morelle M.D.   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 1. Does the need for close, 24 hr/day medical supervision in concert with the patient's rehab needs make it unreasonable for this patient to be served in a less intensive setting? Yes 2. Co-Morbidities requiring supervision/potential complications: Hypertension, dysarthria 3. Due to bladder management, bowel management, safety, skin/wound care,  disease management, medication administration, pain management and patient education, does the patient require 24 hr/day rehab nursing? Yes 4. Does the patient require coordinated care of a physician, rehab nurse, therapy disciplines of PT, OT, speech therapy to address physical and functional deficits in the context of the above medical diagnosis(es)? Yes Addressing deficits in the following areas: balance, endurance, locomotion, strength, transferring, bowel/bladder control, bathing, dressing, feeding, grooming, toileting, cognition and psychosocial support 5. Can the patient actively participate in an intensive therapy program of at least 3 hrs of therapy per day at least 5 days per week? Yes 6. The potential for patient to make measurable gains while on inpatient rehab is good 7. Anticipated functional outcomes upon discharge from inpatient rehab are supervision  with PT, supervision with OT, modified independent with SLP. 8. Estimated rehab length of stay to reach the above functional goals is: 10 to 14 days 9. Anticipated discharge destination: Home 10. Overall Rehab/Functional Prognosis: good  RECOMMENDATIONS: This patient's condition is appropriate for continued rehabilitative care in the following setting: CIR Patient has agreed to participate in recommended program. Yes Note that insurance prior authorization may be required for reimbursement for recommended care.  Comment: Neurology and interventional neuroradiology will need to determine timing of basilar artery stenting procedure.  If it is within the next couple days, would recommend performing this first and then coming to rehabilitation   Cathlyn Parsons, PA-C 06/15/2019  "I have personally performed a face to face diagnostic evaluation of this patient.  Additionally, I have reviewed  and concur with the physician assistant's documentation above." Charlett Blake M.D. [facility] FAAPM&R (Neuromuscular  Med) Diplomate Am Board of Electrodiagnostic Med Fellow Am Board of Interventional Pain

## 2019-06-15 NOTE — Evaluation (Signed)
Occupational Therapy Evaluation Patient Details Name: Megan Martinez MRN: [id number] DOB: 06-Apr-1951 Today's Date: 06/15/2019    History of Present Illness 69 yo admitted with acute right sided weakness upon waking up. Pt s/p tPA with acute pontine infarct. PMHx: HTN, arthritis   Clinical Impression   PT admitted with R side weakness. Pt currently with functional limitiations due to the deficits listed below (see OT problem list). Pt requires total +2 Mod (A) to transfer to chair this session. Pt with expressive deficits but able to verbalize needs. Pt will benefit from skilled OT to increase their independence and safety with adls and balance to allow discharge CIR. Pt reports boyfriend of 11 years lives next door and can give care after rehab.      Follow Up Recommendations  CIR    Equipment Recommendations  3 in 1 bedside commode    Recommendations for Other Services Rehab consult     Precautions / Restrictions Precautions Precautions: Fall Precaution Comments: right hemiparesis Restrictions Weight Bearing Restrictions: No      Mobility Bed Mobility Overal bed mobility: Needs Assistance Bed Mobility: Supine to Sit     Supine to sit: Mod assist;HOB elevated     General bed mobility comments: mod assist to fully elevate trunk and pivot toward left side of bed  Transfers Overall transfer level: Needs assistance   Transfers: Sit to/from Stand;Stand Pivot Transfers Sit to Stand: Mod assist;+2 physical assistance Stand pivot transfers: Mod assist;+2 physical assistance       General transfer comment: cues for sequence with RUE supported and Rt knee blocked to stand and pivot with sequential small steps to chair. Physical assist for weight shifting and moving RLE in standing    Balance Overall balance assessment: Needs assistance Sitting-balance support: Single extremity supported;No upper extremity supported;Feet supported Sitting balance-Leahy Scale:  Fair Sitting balance - Comments: pt able to sit EOB with guarding     Standing balance-Leahy Scale: Poor Standing balance comment: bil UE support with RLE supported                           ADL either performed or assessed with clinical judgement   ADL Overall ADL's : Needs assistance/impaired Eating/Feeding: Minimal assistance   Grooming: Minimal assistance;Sitting   Upper Body Bathing: Moderate assistance   Lower Body Bathing: Moderate assistance   Upper Body Dressing : Minimal assistance   Lower Body Dressing: Moderate assistance Lower Body Dressing Details (indicate cue type and reason): requires OT to help thread socks and knee flexion for RLE to don sock. pt able to pull sock on with L UE Toilet Transfer: +2 for physical assistance;Moderate assistance             General ADL Comments: OOB to chair this session with noted RLE bucklet required blocking     Vision         Perception     Praxis      Pertinent Vitals/Pain Pain Assessment: Faces Faces Pain Scale: No hurt     Hand Dominance Right   Extremity/Trunk Assessment Upper Extremity Assessment Upper Extremity Assessment: RUE deficits/detail RUE Deficits / Details: scapula activation noted and extension with tone with blood pressure cuff. pt reports noraml sensation RUE Coordination: decreased gross motor;decreased fine motor   Lower Extremity Assessment Lower Extremity Assessment: Defer to PT evaluation RLE Deficits / Details: hip flexion 1/5, knee flexion 2-/5, knee extenstion 2-/5, dorsiflexion 1/5   Cervical / Trunk Assessment  Cervical / Trunk Assessment: Normal   Communication Communication Communication: Expressive difficulties   Cognition Arousal/Alertness: Awake/alert Behavior During Therapy: WFL for tasks assessed/performed Overall Cognitive Status: Within Functional Limits for tasks assessed                                     General Comments        Exercises Exercises: General Upper Extremity;Other exercises General Exercises - Upper Extremity Shoulder Flexion: PROM;Right;5 reps;Supine Elbow Flexion: PROM;Right;5 reps;Supine General Exercises - Lower Extremity Long Arc Quad: AAROM;Right;Seated;5 reps Heel Slides: AAROM;Right;Supine;5 reps Other Exercises Other Exercises: scapula retraction and elevation reps 5   Shoulder Instructions      Home Living Family/patient expects to be discharged to:: Private residence Living Arrangements: Alone Available Help at Discharge: Friend(s);Available 24 hours/day Type of Home: House Home Access: Stairs to enter [employer] of Steps: 2   Home Layout: Two level;Laundry or work area in [name] Shower/Tub: Tub/shower unit;Walk-in Psychologist, prison and probation services: Standard     Home Equipment: None      Lives With: Alone    Prior Functioning/Environment Level of Independence: Independent                 OT Problem List: Decreased strength;Decreased activity tolerance;Impaired balance (sitting and/or standing);Decreased safety awareness;Decreased coordination;Decreased knowledge of use of DME or AE;Decreased knowledge of precautions;Impaired UE functional use      OT Treatment/Interventions: Self-care/ADL training;Therapeutic exercise;Energy conservation;Neuromuscular education;DME and/or AE instruction;Manual therapy;Modalities;Therapeutic activities;Patient/family education;Balance training    OT Goals(Current goals can be found in the care plan section) Acute Rehab OT Goals Patient Stated Goal: return to my animals- dogs, cats, opossum OT Goal Formulation: With patient Time For Goal Achievement: 06/29/19 Potential to Achieve Goals: Good  OT Frequency: Min 3X/week   Barriers to D/C: Decreased caregiver support(lives alone)          Co-evaluation PT/OT/SLP Co-Evaluation/Treatment: Yes Reason for Co-Treatment: Complexity of the patient's impairments  (multi-system involvement);For patient/therapist safety;To address functional/ADL transfers PT goals addressed during session: Mobility/safety with mobility;Balance OT goals addressed during session: ADL's and self-care;Proper use of Adaptive equipment and DME;Strengthening/ROM      AM-PAC OT "6 Clicks" Daily Activity     Outcome Measure Help from another person eating meals?: A Little Help from another person taking care of personal grooming?: A Lot Help from another person toileting, which includes using toliet, bedpan, or urinal?: A Lot Help from another person bathing (including washing, rinsing, drying)?: A Lot Help from another person to put on and taking off regular upper body clothing?: A Lot Help from another person to put on and taking off regular lower body clothing?: A Lot 6 Click Score: 13   End of Session Equipment Utilized During Treatment: Gait belt Nurse Communication: Mobility status;Precautions  Activity Tolerance: Patient tolerated treatment well Patient left: in chair;with call bell/phone within reach;with chair alarm set  OT Visit Diagnosis: Unsteadiness on feet (R26.81);Muscle weakness (generalized) (M62.81)                Time: 1019-1040 OT Time Calculation (min): 21 min Charges:  OT General Charges $OT Visit: 1 Visit OT Evaluation $OT Eval Moderate Complexity: 1 Mod   Brynn, OTR/L  Acute Rehabilitation Services Pager: [phone] Office: [phone] .   Jeri Modena 06/15/2019, 2:15 PM

## 2019-06-15 NOTE — Evaluation (Signed)
Physical Therapy Evaluation Patient Details Name: Megan Martinez MRN: [id number] DOB: 06/23/1950 Today's Date: 06/15/2019   History of Present Illness  69 yo admitted with acute right sided weakness upon waking up. Pt s/p tPA with acute pontine infarct. PMHx: HTN, arthritis  Clinical Impression  Pt pleasant after returning from [facility] and reports bad taste in her mouth. Pt with expressive difficulties but trying to speak slowly to further enunciate throughout session. Pt works as a Radio broadcast assistant and at [employer], is normally very active and has 2 large dogs, cats and opossum that she cares for. Pt with right hemiparesis with limited movement of 1-- 2-/5 on right side with right knee buckling in standing with assist for weight shifting and RLE advancement for pivot to chair. Pt reports 24hr available assist of boyfriend and is highly motivated to progress function. Pt will benefit from acute therapy to maximize mobility, safety, strength, balance and function to decrease burden of care. Recommend CIR to maximize recovery and function.      Follow Up Recommendations CIR    Equipment Recommendations  Other (comment)(TBD in next venue)    Recommendations for Other Services Rehab consult     Precautions / Restrictions Precautions Precautions: Fall Precaution Comments: right hemiparesis      Mobility  Bed Mobility Overal bed mobility: Needs Assistance Bed Mobility: Supine to Sit     Supine to sit: Mod assist;HOB elevated     General bed mobility comments: mod assist to fully elevate trunk and pivot toward left side of bed  Transfers Overall transfer level: Needs assistance   Transfers: Sit to/from Stand;Stand Pivot Transfers Sit to Stand: Mod assist;+2 physical assistance Stand pivot transfers: Mod assist;+2 physical assistance       General transfer comment: cues for sequence with RUE supported and Rt knee blocked to stand and pivot with sequential small steps to chair.  Physical assist for weight shifting and moving RLE in standing  Ambulation/Gait             General Gait Details: unable  Stairs            Wheelchair Mobility    Modified Rankin (Stroke Patients Only) Modified Rankin (Stroke Patients Only) Pre-Morbid Rankin Score: No symptoms Modified Rankin: Severe disability     Balance Overall balance assessment: Needs assistance Sitting-balance support: Single extremity supported;No upper extremity supported;Feet supported Sitting balance-Leahy Scale: Fair Sitting balance - Comments: pt able to sit EOB with guarding     Standing balance-Leahy Scale: Poor Standing balance comment: bil UE support with RLE supported                             Pertinent Vitals/Pain Pain Assessment: No/denies pain    Home Living Family/patient expects to be discharged to:: Private residence Living Arrangements: Alone Available Help at Discharge: Friend(s);Available 24 hours/day Type of Home: House Home Access: Stairs to enter   [employer] of Steps: 2 Home Layout: Two level;Laundry or work area in [employer]: None      Prior Function Level of Independence: Independent               Journalist, newspaper        Extremity/Trunk Assessment   Upper Extremity Assessment Upper Extremity Assessment: Defer to OT evaluation    Lower Extremity Assessment Lower Extremity Assessment: RLE deficits/detail RLE Deficits / Details: hip flexion 1/5, knee flexion 2-/5, knee extenstion 2-/5, dorsiflexion 1/5  Cervical / Trunk Assessment Cervical / Trunk Assessment: Normal  Communication   Communication: Expressive difficulties  Cognition Arousal/Alertness: Awake/alert Behavior During Therapy: WFL for tasks assessed/performed Overall Cognitive Status: Within Functional Limits for tasks assessed                                        General Comments      Exercises General Exercises -  Lower Extremity Long Arc Quad: AAROM;Right;Seated;5 reps Heel Slides: AAROM;Right;Supine;5 reps   Assessment/Plan    PT Assessment Patient needs continued PT services  PT Problem List Decreased strength;Decreased mobility;Decreased safety awareness;Decreased activity tolerance;Decreased range of motion;Decreased coordination;Decreased balance;Decreased knowledge of use of DME       PT Treatment Interventions DME instruction;Therapeutic exercise;Balance training;Neuromuscular re-education;Functional mobility training;Cognitive remediation;Therapeutic activities;Patient/family education    PT Goals (Current goals can be found in the Care Plan section)  Acute Rehab PT Goals Patient Stated Goal: return to my animals- dogs, cats, opossum PT Goal Formulation: With patient Time For Goal Achievement: 06/29/19 Potential to Achieve Goals: Good    Frequency Min 4X/week   Barriers to discharge Decreased caregiver support pt lives alone but reports boyfriend of 11 years is retired and lives next door and can provide 24hr assist    Co-evaluation PT/OT/SLP Co-Evaluation/Treatment: Yes Reason for Co-Treatment: Complexity of the patient's impairments (multi-system involvement);For patient/therapist safety PT goals addressed during session: Mobility/safety with mobility;Balance         AM-PAC PT "6 Clicks" Mobility  Outcome Measure Help needed turning from your back to your side while in a flat bed without using bedrails?: A Lot Help needed moving from lying on your back to sitting on the side of a flat bed without using bedrails?: A Lot Help needed moving to and from a bed to a chair (including a wheelchair)?: Total Help needed standing up from a chair using your arms (e.g., wheelchair or bedside chair)?: A Lot Help needed to walk in hospital room?: Total Help needed climbing 3-5 steps with a railing? : Total 6 Click Score: 9    End of Session Equipment Utilized During Treatment: Gait  belt Activity Tolerance: Patient tolerated treatment well Patient left: in chair;with call bell/phone within reach;with chair alarm set Nurse Communication: Mobility status;Precautions(sequence for return pivot toward left) PT Visit Diagnosis: Hemiplegia and hemiparesis;Other symptoms and signs involving the nervous system (R29.898) Hemiplegia - Right/Left: Right Hemiplegia - dominant/non-dominant: Dominant Hemiplegia - caused by: Cerebral infarction    Time: [id number] PT Time Calculation (min) (ACUTE ONLY): 22 min   Charges:   PT Evaluation $PT Eval Moderate Complexity: 1 Mod          Aulden Calise P, PT Acute Rehabilitation Services Pager: [phone] Office: [phone]   [name] Kenn Rekowski 06/15/2019, 12:30 PM

## 2019-06-15 NOTE — Progress Notes (Signed)
STROKE TEAM PROGRESS NOTE   INTERVAL HISTORY Continues to have dysarthria, dysphagia and right hemiplegia..    Vital signs are stable.  2D echo done yesterday shows normal ejection fraction without cardiac source of embolism.  CT scan of the head this morning shows low-density in the pons compatible with acute infarct but no hemorrhagic transformation or obstructive hydrocephalus. Vitals:   06/15/19 0400 06/15/19 0500 06/15/19 0600 06/15/19 0700  BP: (!) 147/67 132/66 126/67 138/66  Pulse: (!) 47 (!) 49 (!) 50 (!) 57  Resp: 15 17 16 20   Temp: 98 F (36.7 C)     TempSrc: Oral     SpO2: 93% 92% 98% 95%  Weight:      Height:        CBC:  Recent Labs  Lab 06/14/19 0719 06/14/19 0719 06/14/19 0725 06/15/19 0644  WBC 5.4  --   --  6.2  NEUTROABS 3.2  --   --   --   HGB 14.6   < > 13.9 13.6  HCT 43.9   < > 41.0 39.7  MCV 86.6  --   --  85.9  PLT 295  --   --  261   < > = values in this interval not displayed.    Basic Metabolic Panel:  Recent Labs  Lab 06/14/19 0719 06/14/19 0719 06/14/19 0725 06/15/19 0644  NA 139   < > 139 139  K 3.0*   < > 3.1* 3.3*  CL 101   < > 99 106  CO2 25  --   --  25  GLUCOSE 152*   < > 153* 125*  BUN 12   < > 14 9  CREATININE 0.81   < > 0.80 0.90  CALCIUM 9.4  --   --  8.9   < > = values in this interval not displayed.   Lipid Panel:     Component Value Date/Time   CHOL 204 (H) 06/15/2019 0644   TRIG 161 (H) 06/15/2019 0644   HDL 35 (L) 06/15/2019 0644   CHOLHDL 5.8 06/15/2019 0644   VLDL 32 06/15/2019 0644   LDLCALC 137 (H) 06/15/2019 0644   HgbA1c:  Lab Results  Component Value Date   HGBA1C 5.8 (H) 06/15/2019   Urine Drug Screen:     Component Value Date/Time   LABOPIA NONE DETECTED 06/14/2019 1252   COCAINSCRNUR NONE DETECTED 06/14/2019 1252   LABBENZ NONE DETECTED 06/14/2019 1252   AMPHETMU NONE DETECTED 06/14/2019 1252   THCU NONE DETECTED 06/14/2019 1252   LABBARB NONE DETECTED 06/14/2019 1252    Alcohol Level      Component Value Date/Time   ETH <10 06/14/2019 0719    IMAGING past 48 hours CT Code Stroke CTA Head W/WO contrast  Result Date: 06/14/2019 CLINICAL DATA:  Ataxia.  Facial droop. EXAM: CT ANGIOGRAPHY HEAD AND NECK CT PERFUSION BRAIN TECHNIQUE: Multidetector CT imaging of the head and neck was performed using the standard protocol during bolus administration of intravenous contrast. Multiplanar CT image reconstructions and MIPs were obtained to evaluate the vascular anatomy. Carotid stenosis measurements (when applicable) are obtained utilizing NASCET criteria, using the distal internal carotid diameter as the denominator. Multiphase CT imaging of the brain was performed following IV bolus contrast injection. Subsequent parametric perfusion maps were calculated using RAPID software. CONTRAST:  174mL OMNIPAQUE IOHEXOL 350 MG/ML SOLN COMPARISON:  CT head without contrast of the same day. FINDINGS: CTA NECK FINDINGS Aortic arch: There is common origin of the left  common carotid artery and the innominate artery. Aortic arch and great vessel origins are within normal limits otherwise. No atherosclerotic change, stenosis, or aneurysm is present. Right carotid system: The right common carotid artery is within normal limits. Bifurcation is unremarkable. Cervical right ICA is normal. Left carotid system: The left common carotid artery is within normal limits. Mild atherosclerotic changes are noted in the proximal left ICA without stenosis significant stenosis relative to the more distal vessel. Vertebral arteries: The left vertebral artery is the dominant vessel. Both vertebral arteries originate from the subclavian arteries without significant stenosis. There is no significant stenosis of either vertebral artery in the neck. Skeleton: Degenerative anterolisthesis present C3-4 and C4-5. Endplate changes contribute to mild foraminal narrowing bilaterally at C5-6. No focal lytic or blastic lesions are present. Other  neck: Neck soft tissues are otherwise within normal limits. Upper chest: Lung apices are clear. Thoracic inlet is within normal limits. Review of the MIP images confirms the above findings CTA HEAD FINDINGS Anterior circulation: Atherosclerotic changes are present within the cavernous internal carotid arteries bilaterally without significant stenosis through the ICA termini. Left posterior communicating artery is noted. The A1 and M1 segments are normal. Left A1 segment is dominant. The anterior communicating artery is patent. MCA bifurcations are within normal limits bilaterally. The ACA and MCA branch vessels are within normal limits. Posterior circulation: Atherosclerotic calcifications are present at the dural margin of the left vertebral artery. 50% stenosis is present. Is some narrowing of distal vertebral arteries scratched at there is some narrowing of distal V4 segments bilaterally. The proximal basilar artery is small. A high-grade mid basilar stenosis is present at the level of the pons. There is flow in the distal basilar artery. A left posterior communicating artery contributes. Moderate proximal PCA stenoses are present bilaterally. There is attenuation of distal PCA branch vessels, right greater than left. Venous sinuses: The dural sinuses are patent. The straight sinus deep cerebral veins are intact. Cortical veins are unremarkable. No vascular malformations are present. Anatomic variants: Prominent left posterior communicating artery. Review of the MIP images confirms the above findings CT Brain Perfusion Findings: ASPECTS: 10/10 CBF (<30%) Volume: 19mL Perfusion (Tmax>6.0s) volume: 26mL. There is some indication of ischemia in the cerebellum when a shorter T-max is used. Mismatch Volume: 56mL Infarction Location:Posterior fossa ischemia IMPRESSION: 1. 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 2. Moderate proximal PCA stenoses bilaterally. 3. 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 4. CT perfusion demonstrates no anterior circulation infarct or ischemia. 5. Question ischemia within the cerebellum. 6. Atherosclerotic changes at the left proximal ICA and bilateral cavernous internal carotid arteries without significant stenosis in the anterior circulation. These results were called by telephone at the time of interpretation on 06/14/2019 at 7:58 am to provider MCNEILL [facility] , who verbally acknowledged these results. Electronically Signed   By: San Morelle M.D.   On: 06/14/2019 08:13   CT HEAD WO CONTRAST  Result Date: 06/15/2019 CLINICAL DATA:  Follow-up stroke.  24 hours post tPA. EXAM: CT HEAD WITHOUT CONTRAST TECHNIQUE: Contiguous axial images were obtained from the base of the skull through the vertex without intravenous contrast. COMPARISON:  MRI and CT studies done yesterday. FINDINGS: Brain: Extensive low-density within the left side of the pons. No hemorrhagic transformation. No focal cerebellar finding. Cerebral hemispheres are normal. No hydrocephalus or extra-axial collection. Vascular: There is atherosclerotic calcification of the major vessels at the base  of the brain. Skull: Negative Sinuses/Orbits: Clear/normal Other: None IMPRESSION: Low-density throughout the left side of the pons consistent with the acute infarction. No hemorrhagic transformation. No hydrocephalus. Electronically Signed   By: [name] Chimes M.D.   On: 06/15/2019 08:08   CT Code Stroke CTA Neck W/WO contrast  Result Date: 06/14/2019 CLINICAL DATA:  Ataxia.  Facial droop. EXAM: CT ANGIOGRAPHY HEAD AND NECK CT PERFUSION BRAIN TECHNIQUE: Multidetector CT imaging of the head and neck was performed using the standard protocol during bolus administration of intravenous contrast. Multiplanar CT image reconstructions and MIPs were obtained to evaluate the vascular anatomy. Carotid stenosis measurements (when applicable) are obtained  utilizing NASCET criteria, using the distal internal carotid diameter as the denominator. Multiphase CT imaging of the brain was performed following IV bolus contrast injection. Subsequent parametric perfusion maps were calculated using RAPID software. CONTRAST:  185mL OMNIPAQUE IOHEXOL 350 MG/ML SOLN COMPARISON:  CT head without contrast of the same day. FINDINGS: CTA NECK FINDINGS Aortic arch: There is common origin of the left common carotid artery and the innominate artery. Aortic arch and great vessel origins are within normal limits otherwise. No atherosclerotic change, stenosis, or aneurysm is present. Right carotid system: The right common carotid artery is within normal limits. Bifurcation is unremarkable. Cervical right ICA is normal. Left carotid system: The left common carotid artery is within normal limits. Mild atherosclerotic changes are noted in the proximal left ICA without stenosis significant stenosis relative to the more distal vessel. Vertebral arteries: The left vertebral artery is the dominant vessel. Both vertebral arteries originate from the subclavian arteries without significant stenosis. There is no significant stenosis of either vertebral artery in the neck. Skeleton: Degenerative anterolisthesis present C3-4 and C4-5. Endplate changes contribute to mild foraminal narrowing bilaterally at C5-6. No focal lytic or blastic lesions are present. Other neck: Neck soft tissues are otherwise within normal limits. Upper chest: Lung apices are clear. Thoracic inlet is within normal limits. Review of the MIP images confirms the above findings CTA HEAD FINDINGS Anterior circulation: Atherosclerotic changes are present within the cavernous internal carotid arteries bilaterally without significant stenosis through the ICA termini. Left posterior communicating artery is noted. The A1 and M1 segments are normal. Left A1 segment is dominant. The anterior communicating artery is patent. MCA bifurcations  are within normal limits bilaterally. The ACA and MCA branch vessels are within normal limits. Posterior circulation: Atherosclerotic calcifications are present at the dural margin of the left vertebral artery. 50% stenosis is present. Is some narrowing of distal vertebral arteries scratched at there is some narrowing of distal V4 segments bilaterally. The proximal basilar artery is small. A high-grade mid basilar stenosis is present at the level of the pons. There is flow in the distal basilar artery. A left posterior communicating artery contributes. Moderate proximal PCA stenoses are present bilaterally. There is attenuation of distal PCA branch vessels, right greater than left. Venous sinuses: The dural sinuses are patent. The straight sinus deep cerebral veins are intact. Cortical veins are unremarkable. No vascular malformations are present. Anatomic variants: Prominent left posterior communicating artery. Review of the MIP images confirms the above findings CT Brain Perfusion Findings: ASPECTS: 10/10 CBF (<30%) Volume: 45mL Perfusion (Tmax>6.0s) volume: 8mL. There is some indication of ischemia in the cerebellum when a shorter T-max is used. Mismatch Volume: 52mL Infarction Location:Posterior fossa ischemia IMPRESSION: 1. 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 2. Moderate proximal PCA stenoses bilaterally. 3. 50% stenosis of the left vertebral artery at the  dural margin and mild narrowing of distal V4 segments bilaterally. 4. CT perfusion demonstrates no anterior circulation infarct or ischemia. 5. Question ischemia within the cerebellum. 6. Atherosclerotic changes at the left proximal ICA and bilateral cavernous internal carotid arteries without significant stenosis in the anterior circulation. These results were called by telephone at the time of interpretation on 06/14/2019 at 7:58 am to provider MCNEILL [facility] , who verbally acknowledged these results. Electronically Signed   By: San Morelle M.D.   On: 06/14/2019 08:13   MR BRAIN WO CONTRAST  Result Date: 06/14/2019 CLINICAL DATA:  Wake up stroke. Ataxia and facial droop. EXAM: MRI HEAD WITHOUT CONTRAST TECHNIQUE: Multiplanar, multiecho pulse sequences of the brain and surrounding structures were obtained without intravenous contrast. COMPARISON:  CT head, CTA, and CT perfusion 06/14/2019 FINDINGS: Brain: Diffusion-weighted images demonstrate restricted diffusion in the left paramedian pons. No significant T2 or FLAIR signal is associated. There may be some diffusion abnormality in the midbrain. No acute hemorrhage is present. Minimal white matter disease is present otherwise, likely within normal limits for age. The ventricles are of normal size. No significant extraaxial fluid collection is present. Vascular: Abnormal flow signal is present in mid basilar artery consistent with known high-grade stenosis. Skull and upper cervical spine: The craniocervical junction is normal. Upper cervical spine is within normal limits. Marrow signal is unremarkable. Sinuses/Orbits: The paranasal sinuses and mastoid air cells are clear. Bilateral lens replacements are noted. Globes and orbits are otherwise unremarkable. IMPRESSION: 1. 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 2. 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 3. No other acute intracranial abnormality. These results were called by telephone at the time of interpretation on 06/14/2019 at 8:07am to provider MCNEILL [facility] , who verbally acknowledged these results. Electronically Signed   By: San Morelle M.D.   On: 06/14/2019 08:17   CT Code Stroke Cerebral Perfusion with contrast  Result Date: 06/14/2019 CLINICAL DATA:  Ataxia.  Facial droop. EXAM: CT ANGIOGRAPHY HEAD AND NECK CT PERFUSION BRAIN TECHNIQUE: Multidetector CT imaging of the head and neck was performed using the standard protocol during bolus  administration of intravenous contrast. Multiplanar CT image reconstructions and MIPs were obtained to evaluate the vascular anatomy. Carotid stenosis measurements (when applicable) are obtained utilizing NASCET criteria, using the distal internal carotid diameter as the denominator. Multiphase CT imaging of the brain was performed following IV bolus contrast injection. Subsequent parametric perfusion maps were calculated using RAPID software. CONTRAST:  137mL OMNIPAQUE IOHEXOL 350 MG/ML SOLN COMPARISON:  CT head without contrast of the same day. FINDINGS: CTA NECK FINDINGS Aortic arch: There is common origin of the left common carotid artery and the innominate artery. Aortic arch and great vessel origins are within normal limits otherwise. No atherosclerotic change, stenosis, or aneurysm is present. Right carotid system: The right common carotid artery is within normal limits. Bifurcation is unremarkable. Cervical right ICA is normal. Left carotid system: The left common carotid artery is within normal limits. Mild atherosclerotic changes are noted in the proximal left ICA without stenosis significant stenosis relative to the more distal vessel. Vertebral arteries: The left vertebral artery is the dominant vessel. Both vertebral arteries originate from the subclavian arteries without significant stenosis. There is no significant stenosis of either vertebral artery in the neck. Skeleton: Degenerative anterolisthesis present C3-4 and C4-5. Endplate changes contribute to mild foraminal narrowing bilaterally at C5-6. No focal lytic or blastic lesions are present. Other neck: Neck soft tissues are  otherwise within normal limits. Upper chest: Lung apices are clear. Thoracic inlet is within normal limits. Review of the MIP images confirms the above findings CTA HEAD FINDINGS Anterior circulation: Atherosclerotic changes are present within the cavernous internal carotid arteries bilaterally without significant stenosis  through the ICA termini. Left posterior communicating artery is noted. The A1 and M1 segments are normal. Left A1 segment is dominant. The anterior communicating artery is patent. MCA bifurcations are within normal limits bilaterally. The ACA and MCA branch vessels are within normal limits. Posterior circulation: Atherosclerotic calcifications are present at the dural margin of the left vertebral artery. 50% stenosis is present. Is some narrowing of distal vertebral arteries scratched at there is some narrowing of distal V4 segments bilaterally. The proximal basilar artery is small. A high-grade mid basilar stenosis is present at the level of the pons. There is flow in the distal basilar artery. A left posterior communicating artery contributes. Moderate proximal PCA stenoses are present bilaterally. There is attenuation of distal PCA branch vessels, right greater than left. Venous sinuses: The dural sinuses are patent. The straight sinus deep cerebral veins are intact. Cortical veins are unremarkable. No vascular malformations are present. Anatomic variants: Prominent left posterior communicating artery. Review of the MIP images confirms the above findings CT Brain Perfusion Findings: ASPECTS: 10/10 CBF (<30%) Volume: 34mL Perfusion (Tmax>6.0s) volume: 110mL. There is some indication of ischemia in the cerebellum when a shorter T-max is used. Mismatch Volume: 74mL Infarction Location:Posterior fossa ischemia IMPRESSION: 1. 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 2. Moderate proximal PCA stenoses bilaterally. 3. 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 4. CT perfusion demonstrates no anterior circulation infarct or ischemia. 5. Question ischemia within the cerebellum. 6. Atherosclerotic changes at the left proximal ICA and bilateral cavernous internal carotid arteries without significant stenosis in the anterior circulation. These results  were called by telephone at the time of interpretation on 06/14/2019 at 7:58 am to provider MCNEILL [facility] , who verbally acknowledged these results. Electronically Signed   By: San Morelle M.D.   On: 06/14/2019 08:13   ECHOCARDIOGRAM COMPLETE  Result Date: 06/14/2019   ECHOCARDIOGRAM REPORT   Patient Name:   Megan Martinez Date of Exam: 06/14/2019 Medical Rec #:  [id number]        Height:       64.0 in Accession #:    [id number]       Weight:       169.0 lb Date of Birth:  1950-12-17         BSA:          1.82 m Patient Age:    69 years         BP:           114/101 mmHg Patient Gender: F                HR:           77 bpm. Exam Location:  Inpatient Procedure: 2D Echo Indications:    stroke 434.91  History:        Patient has no prior history of Echocardiogram examinations.                 Risk Factors:Hypertension and Former Smoker.  Sonographer:    Jannett Celestine RDCS (AE) Referring Phys: [phone] MCNEILL P KIRKPATRICK  Sonographer Comments: restricted mobility IMPRESSIONS  1. Left ventricular ejection fraction, by visual estimation,  is 60 to 65%. The left ventricle has normal function. There is no left ventricular hypertrophy.  2. Left ventricular diastolic parameters are consistent with Grade I diastolic dysfunction (impaired relaxation).  3. The left ventricle has no regional wall motion abnormalities.  4. Global right ventricle has normal systolic function.The right ventricular size is normal. No increase in right ventricular wall thickness.  5. Left atrial size was normal.  6. Right atrial size was normal.  7. Mild mitral annular calcification.  8. The mitral valve is normal in structure. No evidence of mitral valve regurgitation. No evidence of mitral stenosis.  9. The tricuspid valve is normal in structure. Tricuspid valve regurgitation is trivial. 10. The aortic valve is tricuspid. Aortic valve regurgitation is not visualized. No evidence of aortic valve sclerosis or stenosis. 11. The inferior  vena cava is normal in size with greater than 50% respiratory variability, suggesting right atrial pressure of 3 mmHg. 12. TR signal is inadequate for assessing pulmonary artery systolic pressure. FINDINGS  Left Ventricle: Left ventricular ejection fraction, by visual estimation, is 60 to 65%. The left ventricle has normal function. The left ventricle has no regional wall motion abnormalities. The left ventricular internal cavity size was the left ventricle is normal in size. There is no left ventricular hypertrophy. Left ventricular diastolic parameters are consistent with Grade I diastolic dysfunction (impaired relaxation). Right Ventricle: The right ventricular size is normal. No increase in right ventricular wall thickness. Global RV systolic function is has normal systolic function. Left Atrium: Left atrial size was normal in size. Right Atrium: Right atrial size was normal in size Pericardium: There is no evidence of pericardial effusion. Mitral Valve: The mitral valve is normal in structure. Mild mitral annular calcification. No evidence of mitral valve regurgitation. No evidence of mitral valve stenosis by observation. Tricuspid Valve: The tricuspid valve is normal in structure. Tricuspid valve regurgitation is trivial. Aortic Valve: The aortic valve is tricuspid. Aortic valve regurgitation is not visualized. The aortic valve is structurally normal, with no evidence of sclerosis or stenosis. Pulmonic Valve: The pulmonic valve was normal in structure. Pulmonic valve regurgitation is not visualized. Pulmonic regurgitation is not visualized. Aorta: The aortic root is normal in size and structure. Venous: The inferior vena cava is normal in size with greater than 50% respiratory variability, suggesting right atrial pressure of 3 mmHg. IAS/Shunts: No atrial level shunt detected by color flow Doppler.  LEFT VENTRICLE PLAX 2D LVIDd:         3.30 cm  Diastology LVIDs:         1.70 cm  LV e' lateral:   15.20 cm/s LV  PW:         1.10 cm  LV E/e' lateral: 4.1 LV IVS:        1.00 cm  LV e' medial:    8.92 cm/s LVOT diam:     1.90 cm  LV E/e' medial:  6.9 LV SV:         36 ml LV SV Index:   18.98 LVOT Area:     2.84 cm  RIGHT VENTRICLE TAPSE (M-mode): 1.9 cm LEFT ATRIUM             Index LA diam:        3.20 cm 1.76 cm/m LA Vol (A2C):   43.7 ml 23.99 ml/m LA Vol (A4C):   41.2 ml 22.62 ml/m LA Biplane Vol: 42.4 ml 23.28 ml/m  AORTIC VALVE LVOT Vmax:   90.10 cm/s LVOT Vmean:  63.600 cm/s LVOT VTI:    0.261 m  AORTA Ao Root diam: 2.90 cm MITRAL VALVE MV Area (PHT): 2.16 cm             SHUNTS MV PHT:        101.79 msec          Systemic VTI:  0.26 m MV Decel Time: 351 msec             Systemic Diam: 1.90 cm MV E velocity: 61.70 cm/s 103 cm/s MV A velocity: 43.30 cm/s 70.3 cm/s MV E/A ratio:  1.42       1.5  Loralie Champagne MD Electronically signed by Loralie Champagne MD Signature Date/Time: 06/14/2019/5:12:56 PM    Final    CT HEAD CODE STROKE WO CONTRAST  Result Date: 06/14/2019 CLINICAL DATA:  Code stroke. Ataxia. Facial droop and slurred speech. Last seen normal at 9:30 p.m. last night. EXAM: CT HEAD WITHOUT CONTRAST TECHNIQUE: Contiguous axial images were obtained from the base of the skull through the vertex without intravenous contrast. COMPARISON:  None. FINDINGS: Brain: No acute infarct, hemorrhage, or mass lesion is present. The ventricles are of normal size. No significant white matter lesions are present. No significant extraaxial fluid collection is present. Vascular: Atherosclerotic calcifications are present within the cavernous internal carotid arteries bilaterally. There is no hyperdense vessel. Skull: Calvarium is intact. No focal lytic or blastic lesions are present. No significant extracranial soft tissue lesion is present. Sinuses/Orbits: The paranasal sinuses and mastoid air cells are clear. Bilateral lens replacements are noted. Globes and orbits are otherwise unremarkable. ASPECTS [facility] Stroke Program  Early CT Score) - Ganglionic level infarction (caudate, lentiform nuclei, internal capsule, insula, M1-M3 cortex): 7/7 - Supraganglionic infarction (M4-M6 cortex): 3/3 Total score (0-10 with 10 being normal): 10/10 IMPRESSION: 1. Negative CT of the head. 2. ASPECTS is 10/10 The above was relayed via text pager to Dr. Leonel Ramsay on 06/14/2019 at 07:32 . Electronically Signed   By: San Morelle M.D.   On: 06/14/2019 07:32    PHYSICAL EXAM    Pleasant middle-aged Caucasian lady not in distress. . Afebrile. Head is nontraumatic. Neck is supple without bruit.    Cardiac exam no murmur or gallop. Lungs are clear to auscultation. Distal pulses are well felt. Neurological Exam :  She is awake alert oriented to time place and person.  Speech is mildly dysarthric .  She follows commands well.  Extraocular movements are full range but there is saccadic dysmetria on the right lateral gaze.  She has moderate right lower facial weakness.  Tongue is midline she has very weak cough and gag.  Motor system exam shows increased tone on the right side with right hemiplegia right upper extremity 1/5 and right lower extremity 2/5 strength.  Reflexes brisk bilaterally right plantar upgoing left downgoing.  Sensation is preserved bilaterally.  Gait not tested.   ASSESSMENT/PLAN Megan Martinez is a 69 y.o. female with history of HTN and thyroid disease presenting with R sided weakness and dysarthria. Received tPA 06/14/2019 at 0817.  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 ASPECTS 10.    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 Moderate B proximal PCA stenoses. L VA 50% stenosis w/ narrowing distal  V4. ? Cerebellar ischemia.   CT perfusion no infarct   MRI  L  paramedian pontine infarct. High-grade mid BA stenosis.   CT at 24h low density L ponts. No  hemorrhage   2D Echo EF 60-65%. No source of embolus    LDL 137  HgbA1c 5.8  SCDs for VTE prophylaxis  aspirin 81 mg daily prior to admission, now on aspirin 81 mg daily, clopidogrel 75 mg daily and No antithrombotic following aspirin and Plavix load. Continue DAPT x 3 months then plavix alone.  Therapy recommendations:  CIR. Consult placed  Disposition:  pending   Transfer to the floor  Hypertension  Home meds:  norvasc 10, cadesartan-HCTZ 32-25  . Long-term BP goal 130-150 given BA stenosis   Dysphagia, resolved . Secondary to stroke . Given stroke location, will have SLP assess initially . Cleared for diet by Speech    Other Stroke Risk Factors  Advanced age  Former Cigarette smoker, quit 51 yrs ago  ETOH use, alcohol level <10, advised to drink no more than 1 drink(s) a day  Overweight,  Body mass index is 29.02 kg/m., recommend weight loss, diet and exercise as appropriate   Other Active Problems  Hypokalemia 3.0-3.1 - supplement - 3.3 - supplemented  Hypothyroid on synthroid  Hospital day # 1 Mobilize out of bed.  Therapy consults.  Plan to load with Plavix 300 mg today followed by Plavix 75 mg and aspirin 81 mg daily.  Transfer to inpatient rehab in the next few days.  Transfer to neurology floor bed.  Discussed with patient and therapist.  Greater than 50% time during this 35-minute visit was spent on counseling and coordination of care and answering questions.  Antony Contras, MD Medical Director [facility] Pager: [phone] 06/15/2019 8:32 AM   To contact Stroke Continuity provider, please refer to [email or web address] After hours, contact General Neurology

## 2019-06-16 DIAGNOSIS — E876 Hypokalemia: Secondary | ICD-10-CM

## 2019-06-16 DIAGNOSIS — E7800 Pure hypercholesterolemia, unspecified: Secondary | ICD-10-CM

## 2019-06-16 DIAGNOSIS — I651 Occlusion and stenosis of basilar artery: Secondary | ICD-10-CM

## 2019-06-16 LAB — CBC
HCT: 38.6 % (ref 36.0–46.0)
Hemoglobin: 13.2 g/dL (ref 12.0–15.0)
MCH: 29.5 pg (ref 26.0–34.0)
MCHC: 34.2 g/dL (ref 30.0–36.0)
MCV: 86.2 fL (ref 80.0–100.0)
Platelets: 268 10*3/uL (ref 150–400)
RBC: 4.48 MIL/uL (ref 3.87–5.11)
RDW: 13.3 % (ref 11.5–15.5)
WBC: 7.5 10*3/uL (ref 4.0–10.5)
nRBC: 0 % (ref 0.0–0.2)

## 2019-06-16 LAB — TSH: TSH: 1.249 u[IU]/mL (ref 0.350–4.500)

## 2019-06-16 LAB — BASIC METABOLIC PANEL
Anion gap: 10 (ref 5–15)
BUN: 9 mg/dL (ref 8–23)
CO2: 24 mmol/L (ref 22–32)
Calcium: 9.1 mg/dL (ref 8.9–10.3)
Chloride: 106 mmol/L (ref 98–111)
Creatinine, Ser: 0.81 mg/dL (ref 0.44–1.00)
GFR calc Af Amer: >60 mL/min (ref >60)
GFR calc non Af Amer: >60 mL/min (ref >60)
Glucose, Bld: 106 mg/dL — ABNORMAL HIGH (ref 70–99)
Potassium: 3.5 mmol/L (ref 3.5–5.1)
Sodium: 140 mmol/L (ref 135–145)

## 2019-06-16 MED ORDER — HYDRALAZINE HCL 20 MG/ML IJ SOLN
5.0000 mg | INTRAMUSCULAR | Status: DC | PRN
  Administered 2019-06-19: 5 mg via INTRAVENOUS
  Filled 2019-06-16: qty 1

## 2019-06-16 MED ORDER — ATORVASTATIN CALCIUM 80 MG PO TABS
80.0000 mg | ORAL_TABLET | Freq: Every day | ORAL | Status: DC
  Administered 2019-06-17 – 2019-06-19 (×3): 80 mg via ORAL
  Filled 2019-06-16 (×3): qty 1

## 2019-06-16 MED ORDER — ENOXAPARIN SODIUM 40 MG/0.4ML ~~LOC~~ SOLN
40.0000 mg | SUBCUTANEOUS | Status: DC
  Administered 2019-06-16 – 2019-06-19 (×4): 40 mg via SUBCUTANEOUS
  Filled 2019-06-16 (×5): qty 0.4

## 2019-06-16 NOTE — Progress Notes (Signed)
Inpatient Rehabilitation Admissions Coordinator  Inpatient rehab consult received. I will complete rehab consult on Monday to assist with planning rehab dispo.  Danne Baxter, RN, MSN Rehab Admissions Coordinator [phone] 06/16/2019 9:38 AM

## 2019-06-16 NOTE — Progress Notes (Signed)
STROKE TEAM PROGRESS NOTE   INTERVAL HISTORY RN at bedside. Pt neuro stable, still has dysarthria, RUE flaccid, but RLE able to lift against gravity. Pending CIR. Passed swallow yesterday, on diet and thin liquid.   Vitals:   06/16/19 0400 06/16/19 0500 06/16/19 0600 06/16/19 0700  BP: (!) 151/75 (!) 140/59 (!) 142/72 (!) 150/71  Pulse: (!) 48 (!) 50 71 (!) 49  Resp: 17 18 18 18   Temp: 99 F (37.2 C)     TempSrc: Oral     SpO2: 92% 94% 93% 93%  Weight:      Height:        CBC:  Recent Labs  Lab 06/14/19 0719 06/14/19 0725 06/15/19 0644 06/16/19 0412  WBC 5.4   < > 6.2 7.5  NEUTROABS 3.2  --   --   --   HGB 14.6   < > 13.6 13.2  HCT 43.9   < > 39.7 38.6  MCV 86.6   < > 85.9 86.2  PLT 295   < > 261 268   < > = values in this interval not displayed.    Basic Metabolic Panel:  Recent Labs  Lab 06/15/19 0644 06/16/19 0412  NA 139 140  K 3.3* 3.5  CL 106 106  CO2 25 24  GLUCOSE 125* 106*  BUN 9 9  CREATININE 0.90 0.81  CALCIUM 8.9 9.1   Lipid Panel:     Component Value Date/Time   CHOL 204 (H) 06/15/2019 0644   TRIG 161 (H) 06/15/2019 0644   HDL 35 (L) 06/15/2019 0644   CHOLHDL 5.8 06/15/2019 0644   VLDL 32 06/15/2019 0644   LDLCALC 137 (H) 06/15/2019 0644   HgbA1c:  Lab Results  Component Value Date   HGBA1C 5.8 (H) 06/15/2019   Urine Drug Screen:     Component Value Date/Time   LABOPIA NONE DETECTED 06/14/2019 1252   COCAINSCRNUR NONE DETECTED 06/14/2019 1252   LABBENZ NONE DETECTED 06/14/2019 1252   AMPHETMU NONE DETECTED 06/14/2019 1252   THCU NONE DETECTED 06/14/2019 1252   LABBARB NONE DETECTED 06/14/2019 1252    Alcohol Level     Component Value Date/Time   ETH <10 06/14/2019 0719    IMAGING past 48 hours CT Code Stroke CTA Head W/WO contrast  Result Date: 06/14/2019 CLINICAL DATA:  Ataxia.  Facial droop. EXAM: CT ANGIOGRAPHY HEAD AND NECK CT PERFUSION BRAIN TECHNIQUE: Multidetector CT imaging of the head and neck was performed using  the standard protocol during bolus administration of intravenous contrast. Multiplanar CT image reconstructions and MIPs were obtained to evaluate the vascular anatomy. Carotid stenosis measurements (when applicable) are obtained utilizing NASCET criteria, using the distal internal carotid diameter as the denominator. Multiphase CT imaging of the brain was performed following IV bolus contrast injection. Subsequent parametric perfusion maps were calculated using RAPID software. CONTRAST:  164mL OMNIPAQUE IOHEXOL 350 MG/ML SOLN COMPARISON:  CT head without contrast of the same day. FINDINGS: CTA NECK FINDINGS Aortic arch: There is common origin of the left common carotid artery and the innominate artery. Aortic arch and great vessel origins are within normal limits otherwise. No atherosclerotic change, stenosis, or aneurysm is present. Right carotid system: The right common carotid artery is within normal limits. Bifurcation is unremarkable. Cervical right ICA is normal. Left carotid system: The left common carotid artery is within normal limits. Mild atherosclerotic changes are noted in the proximal left ICA without stenosis significant stenosis relative to the more distal vessel. Vertebral arteries: The  left vertebral artery is the dominant vessel. Both vertebral arteries originate from the subclavian arteries without significant stenosis. There is no significant stenosis of either vertebral artery in the neck. Skeleton: Degenerative anterolisthesis present C3-4 and C4-5. Endplate changes contribute to mild foraminal narrowing bilaterally at C5-6. No focal lytic or blastic lesions are present. Other neck: Neck soft tissues are otherwise within normal limits. Upper chest: Lung apices are clear. Thoracic inlet is within normal limits. Review of the MIP images confirms the above findings CTA HEAD FINDINGS Anterior circulation: Atherosclerotic changes are present within the cavernous internal carotid arteries  bilaterally without significant stenosis through the ICA termini. Left posterior communicating artery is noted. The A1 and M1 segments are normal. Left A1 segment is dominant. The anterior communicating artery is patent. MCA bifurcations are within normal limits bilaterally. The ACA and MCA branch vessels are within normal limits. Posterior circulation: Atherosclerotic calcifications are present at the dural margin of the left vertebral artery. 50% stenosis is present. Is some narrowing of distal vertebral arteries scratched at there is some narrowing of distal V4 segments bilaterally. The proximal basilar artery is small. A high-grade mid basilar stenosis is present at the level of the pons. There is flow in the distal basilar artery. A left posterior communicating artery contributes. Moderate proximal PCA stenoses are present bilaterally. There is attenuation of distal PCA branch vessels, right greater than left. Venous sinuses: The dural sinuses are patent. The straight sinus deep cerebral veins are intact. Cortical veins are unremarkable. No vascular malformations are present. Anatomic variants: Prominent left posterior communicating artery. Review of the MIP images confirms the above findings CT Brain Perfusion Findings: ASPECTS: 10/10 CBF (<30%) Volume: 95mL Perfusion (Tmax>6.0s) volume: 59mL. There is some indication of ischemia in the cerebellum when a shorter T-max is used. Mismatch Volume: 64mL Infarction Location:Posterior fossa ischemia IMPRESSION: 1. 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 2. Moderate proximal PCA stenoses bilaterally. 3. 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 4. CT perfusion demonstrates no anterior circulation infarct or ischemia. 5. Question ischemia within the cerebellum. 6. Atherosclerotic changes at the left proximal ICA and bilateral cavernous internal carotid arteries without significant stenosis in  the anterior circulation. These results were called by telephone at the time of interpretation on 06/14/2019 at 7:58 am to provider MCNEILL [facility] , who verbally acknowledged these results. Electronically Signed   By: San Morelle M.D.   On: 06/14/2019 08:13   CT HEAD WO CONTRAST  Result Date: 06/15/2019 CLINICAL DATA:  Follow-up stroke.  24 hours post tPA. EXAM: CT HEAD WITHOUT CONTRAST TECHNIQUE: Contiguous axial images were obtained from the base of the skull through the vertex without intravenous contrast. COMPARISON:  MRI and CT studies done yesterday. FINDINGS: Brain: Extensive low-density within the left side of the pons. No hemorrhagic transformation. No focal cerebellar finding. Cerebral hemispheres are normal. No hydrocephalus or extra-axial collection. Vascular: There is atherosclerotic calcification of the major vessels at the base of the brain. Skull: Negative Sinuses/Orbits: Clear/normal Other: None IMPRESSION: Low-density throughout the left side of the pons consistent with the acute infarction. No hemorrhagic transformation. No hydrocephalus. Electronically Signed   By: [name] Chimes M.D.   On: 06/15/2019 08:08   CT Code Stroke CTA Neck W/WO contrast  Result Date: 06/14/2019 CLINICAL DATA:  Ataxia.  Facial droop. EXAM: CT ANGIOGRAPHY HEAD AND NECK CT PERFUSION BRAIN TECHNIQUE: Multidetector CT imaging of the head and neck was  performed using the standard protocol during bolus administration of intravenous contrast. Multiplanar CT image reconstructions and MIPs were obtained to evaluate the vascular anatomy. Carotid stenosis measurements (when applicable) are obtained utilizing NASCET criteria, using the distal internal carotid diameter as the denominator. Multiphase CT imaging of the brain was performed following IV bolus contrast injection. Subsequent parametric perfusion maps were calculated using RAPID software. CONTRAST:  173mL OMNIPAQUE IOHEXOL 350 MG/ML SOLN COMPARISON:  CT  head without contrast of the same day. FINDINGS: CTA NECK FINDINGS Aortic arch: There is common origin of the left common carotid artery and the innominate artery. Aortic arch and great vessel origins are within normal limits otherwise. No atherosclerotic change, stenosis, or aneurysm is present. Right carotid system: The right common carotid artery is within normal limits. Bifurcation is unremarkable. Cervical right ICA is normal. Left carotid system: The left common carotid artery is within normal limits. Mild atherosclerotic changes are noted in the proximal left ICA without stenosis significant stenosis relative to the more distal vessel. Vertebral arteries: The left vertebral artery is the dominant vessel. Both vertebral arteries originate from the subclavian arteries without significant stenosis. There is no significant stenosis of either vertebral artery in the neck. Skeleton: Degenerative anterolisthesis present C3-4 and C4-5. Endplate changes contribute to mild foraminal narrowing bilaterally at C5-6. No focal lytic or blastic lesions are present. Other neck: Neck soft tissues are otherwise within normal limits. Upper chest: Lung apices are clear. Thoracic inlet is within normal limits. Review of the MIP images confirms the above findings CTA HEAD FINDINGS Anterior circulation: Atherosclerotic changes are present within the cavernous internal carotid arteries bilaterally without significant stenosis through the ICA termini. Left posterior communicating artery is noted. The A1 and M1 segments are normal. Left A1 segment is dominant. The anterior communicating artery is patent. MCA bifurcations are within normal limits bilaterally. The ACA and MCA branch vessels are within normal limits. Posterior circulation: Atherosclerotic calcifications are present at the dural margin of the left vertebral artery. 50% stenosis is present. Is some narrowing of distal vertebral arteries scratched at there is some narrowing  of distal V4 segments bilaterally. The proximal basilar artery is small. A high-grade mid basilar stenosis is present at the level of the pons. There is flow in the distal basilar artery. A left posterior communicating artery contributes. Moderate proximal PCA stenoses are present bilaterally. There is attenuation of distal PCA branch vessels, right greater than left. Venous sinuses: The dural sinuses are patent. The straight sinus deep cerebral veins are intact. Cortical veins are unremarkable. No vascular malformations are present. Anatomic variants: Prominent left posterior communicating artery. Review of the MIP images confirms the above findings CT Brain Perfusion Findings: ASPECTS: 10/10 CBF (<30%) Volume: 75mL Perfusion (Tmax>6.0s) volume: 107mL. There is some indication of ischemia in the cerebellum when a shorter T-max is used. Mismatch Volume: 68mL Infarction Location:Posterior fossa ischemia IMPRESSION: 1. 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 2. Moderate proximal PCA stenoses bilaterally. 3. 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 4. CT perfusion demonstrates no anterior circulation infarct or ischemia. 5. Question ischemia within the cerebellum. 6. Atherosclerotic changes at the left proximal ICA and bilateral cavernous internal carotid arteries without significant stenosis in the anterior circulation. These results were called by telephone at the time of interpretation on 06/14/2019 at 7:58 am to provider MCNEILL [facility] , who verbally acknowledged these results. Electronically Signed   By: San Morelle  M.D.   On: 06/14/2019 08:13   MR BRAIN WO CONTRAST  Result Date: 06/14/2019 CLINICAL DATA:  Wake up stroke. Ataxia and facial droop. EXAM: MRI HEAD WITHOUT CONTRAST TECHNIQUE: Multiplanar, multiecho pulse sequences of the brain and surrounding structures were obtained without intravenous contrast. COMPARISON:   CT head, CTA, and CT perfusion 06/14/2019 FINDINGS: Brain: Diffusion-weighted images demonstrate restricted diffusion in the left paramedian pons. No significant T2 or FLAIR signal is associated. There may be some diffusion abnormality in the midbrain. No acute hemorrhage is present. Minimal white matter disease is present otherwise, likely within normal limits for age. The ventricles are of normal size. No significant extraaxial fluid collection is present. Vascular: Abnormal flow signal is present in mid basilar artery consistent with known high-grade stenosis. Skull and upper cervical spine: The craniocervical junction is normal. Upper cervical spine is within normal limits. Marrow signal is unremarkable. Sinuses/Orbits: The paranasal sinuses and mastoid air cells are clear. Bilateral lens replacements are noted. Globes and orbits are otherwise unremarkable. IMPRESSION: 1. 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 2. 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 3. No other acute intracranial abnormality. These results were called by telephone at the time of interpretation on 06/14/2019 at 8:07am to provider MCNEILL [facility] , who verbally acknowledged these results. Electronically Signed   By: San Morelle M.D.   On: 06/14/2019 08:17   CT Code Stroke Cerebral Perfusion with contrast  Result Date: 06/14/2019 CLINICAL DATA:  Ataxia.  Facial droop. EXAM: CT ANGIOGRAPHY HEAD AND NECK CT PERFUSION BRAIN TECHNIQUE: Multidetector CT imaging of the head and neck was performed using the standard protocol during bolus administration of intravenous contrast. Multiplanar CT image reconstructions and MIPs were obtained to evaluate the vascular anatomy. Carotid stenosis measurements (when applicable) are obtained utilizing NASCET criteria, using the distal internal carotid diameter as the denominator. Multiphase CT imaging of the brain was performed  following IV bolus contrast injection. Subsequent parametric perfusion maps were calculated using RAPID software. CONTRAST:  121mL OMNIPAQUE IOHEXOL 350 MG/ML SOLN COMPARISON:  CT head without contrast of the same day. FINDINGS: CTA NECK FINDINGS Aortic arch: There is common origin of the left common carotid artery and the innominate artery. Aortic arch and great vessel origins are within normal limits otherwise. No atherosclerotic change, stenosis, or aneurysm is present. Right carotid system: The right common carotid artery is within normal limits. Bifurcation is unremarkable. Cervical right ICA is normal. Left carotid system: The left common carotid artery is within normal limits. Mild atherosclerotic changes are noted in the proximal left ICA without stenosis significant stenosis relative to the more distal vessel. Vertebral arteries: The left vertebral artery is the dominant vessel. Both vertebral arteries originate from the subclavian arteries without significant stenosis. There is no significant stenosis of either vertebral artery in the neck. Skeleton: Degenerative anterolisthesis present C3-4 and C4-5. Endplate changes contribute to mild foraminal narrowing bilaterally at C5-6. No focal lytic or blastic lesions are present. Other neck: Neck soft tissues are otherwise within normal limits. Upper chest: Lung apices are clear. Thoracic inlet is within normal limits. Review of the MIP images confirms the above findings CTA HEAD FINDINGS Anterior circulation: Atherosclerotic changes are present within the cavernous internal carotid arteries bilaterally without significant stenosis through the ICA termini. Left posterior communicating artery is noted. The A1 and M1 segments are normal. Left A1 segment is dominant. The anterior communicating artery is patent. MCA bifurcations are within normal limits bilaterally. The ACA  and MCA branch vessels are within normal limits. Posterior circulation: Atherosclerotic  calcifications are present at the dural margin of the left vertebral artery. 50% stenosis is present. Is some narrowing of distal vertebral arteries scratched at there is some narrowing of distal V4 segments bilaterally. The proximal basilar artery is small. A high-grade mid basilar stenosis is present at the level of the pons. There is flow in the distal basilar artery. A left posterior communicating artery contributes. Moderate proximal PCA stenoses are present bilaterally. There is attenuation of distal PCA branch vessels, right greater than left. Venous sinuses: The dural sinuses are patent. The straight sinus deep cerebral veins are intact. Cortical veins are unremarkable. No vascular malformations are present. Anatomic variants: Prominent left posterior communicating artery. Review of the MIP images confirms the above findings CT Brain Perfusion Findings: ASPECTS: 10/10 CBF (<30%) Volume: 68mL Perfusion (Tmax>6.0s) volume: 56mL. There is some indication of ischemia in the cerebellum when a shorter T-max is used. Mismatch Volume: 37mL Infarction Location:Posterior fossa ischemia IMPRESSION: 1. 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 2. Moderate proximal PCA stenoses bilaterally. 3. 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 4. CT perfusion demonstrates no anterior circulation infarct or ischemia. 5. Question ischemia within the cerebellum. 6. Atherosclerotic changes at the left proximal ICA and bilateral cavernous internal carotid arteries without significant stenosis in the anterior circulation. These results were called by telephone at the time of interpretation on 06/14/2019 at 7:58 am to provider MCNEILL [facility] , who verbally acknowledged these results. Electronically Signed   By: San Morelle M.D.   On: 06/14/2019 08:13   DG Swallowing Func-Speech Pathology  Result Date: 06/15/2019 Objective Swallowing Evaluation:  Type of Study: MBS-Modified Barium Swallow Study  Patient Details Name: Megan Martinez MRN: [id number] Date of Birth: 1950/08/02 Today's Date: 06/15/2019 Time: SLP Start Time (ACUTE ONLY): 0950 -SLP Stop Time (ACUTE ONLY): 1010 SLP Time Calculation (min) (ACUTE ONLY): 20 min Past Medical History: Past Medical History: Diagnosis Date . Allergy  . Arthritis  . Hypertension  . Thyroid disease  Past Surgical History: Past Surgical History: Procedure Laterality Date . ABDOMINAL HYSTERECTOMY   . APPENDECTOMY   . CATARACT EXTRACTION   . CHOLECYSTECTOMY   . TUBAL LIGATION   HPI:  69 y.o. female with a history of hypertension admitted with right sided hemiparesis and facial asymmetry; received IV TPA.  Dx acute left paramedian pontine CVA.  Subjective: alert, cooporative Assessment / Plan / Recommendation CHL IP CLINICAL IMPRESSIONS 06/15/2019 Clinical Impression Patient presents with mild oropharyngeal dysphagia characterized by silent aspiration with thin liquids. Oral phase remarkable for reduced lingual strength resulting in lingual residue. Moments of clearing residue resulted in episodes of impaired timing. This along with reduced sensation resulted in silent aspiration of thin liquids. As pt utilized chin tuck with straw for thin liquids, no penetration/aspiration observed. With pt's motivation and utilization of compensatory strategies, good progress is suspected. Dysphagia 3 diet and thin liquids utilizing chin tuck recommended, with intermittent staff supervision. SLP Visit Diagnosis Dysarthria and anarthria (R47.1);Dysphagia, oropharyngeal phase (R13.12) Attention and concentration deficit following -- Frontal lobe and executive function deficit following -- Impact on safety and function Mild aspiration risk;Moderate aspiration risk   CHL IP TREATMENT RECOMMENDATION 06/15/2019 Treatment Recommendations Therapy as outlined in treatment plan below   Prognosis 06/15/2019 Prognosis for Safe Diet Advancement Good Barriers  to Reach Goals -- Barriers/Prognosis Comment -- CHL IP DIET RECOMMENDATION 06/15/2019  SLP Diet Recommendations Thin liquid;Dysphagia 3 (Mech soft) solids Liquid Administration via Straw Medication Administration Whole meds with puree Compensations Chin tuck;Use straw to facilitate chin tuck Postural Changes Seated upright at 90 degrees   CHL IP OTHER RECOMMENDATIONS 06/15/2019 Recommended Consults -- Oral Care Recommendations Oral care BID Other Recommendations Order thickener from pharmacy   CHL IP FOLLOW UP RECOMMENDATIONS 06/15/2019 Follow up Recommendations Inpatient Rehab   CHL IP FREQUENCY AND DURATION 06/15/2019 Speech Therapy Frequency (ACUTE ONLY) min 2x/week Treatment Duration 2 weeks      CHL IP ORAL PHASE 06/15/2019 Oral Phase Impaired Oral - Pudding Teaspoon -- Oral - Pudding Cup -- Oral - Honey Teaspoon -- Oral - Honey Cup -- Oral - Nectar Teaspoon -- Oral - Nectar Cup -- Oral - Nectar Straw -- Oral - Thin Teaspoon -- Oral - Thin Cup -- Oral - Thin Straw Lingual/palatal residue Oral - Puree Lingual/palatal residue;Premature spillage Oral - Mech Soft Lingual/palatal residue Oral - Regular -- Oral - Multi-Consistency -- Oral - Pill -- Oral Phase - Comment --  CHL IP PHARYNGEAL PHASE 06/15/2019 Pharyngeal Phase Impaired Pharyngeal- Pudding Teaspoon -- Pharyngeal -- Pharyngeal- Pudding Cup -- Pharyngeal -- Pharyngeal- Honey Teaspoon -- Pharyngeal -- Pharyngeal- Honey Cup -- Pharyngeal -- Pharyngeal- Nectar Teaspoon -- Pharyngeal -- Pharyngeal- Nectar Cup -- Pharyngeal -- Pharyngeal- Nectar Straw -- Pharyngeal -- Pharyngeal- Thin Teaspoon -- Pharyngeal -- Pharyngeal- Thin Cup -- Pharyngeal -- Pharyngeal- Thin Straw Penetration/Aspiration during swallow;Trace aspiration;Pharyngeal residue - pyriform;Pharyngeal residue - valleculae;Reduced airway/laryngeal closure;Compensatory strategies attempted (with notebox) Pharyngeal Material enters airway, remains ABOVE vocal cords then ejected out Pharyngeal- Puree Reduced  epiglottic inversion;Trace aspiration;Penetration/Aspiration during swallow;Reduced airway/laryngeal closure Pharyngeal Material enters airway, remains ABOVE vocal cords then ejected out Pharyngeal- Mechanical Soft -- Pharyngeal -- Pharyngeal- Regular -- Pharyngeal -- Pharyngeal- Multi-consistency -- Pharyngeal -- Pharyngeal- Pill -- Pharyngeal -- Pharyngeal Comment --  CHL IP CERVICAL ESOPHAGEAL PHASE 06/15/2019 Cervical Esophageal Phase WFL Pudding Teaspoon -- Pudding Cup -- Honey Teaspoon -- Honey Cup -- Nectar Teaspoon -- Nectar Cup -- Nectar Straw -- Thin Teaspoon -- Thin Cup -- Thin Straw -- Puree -- Mechanical Soft -- Regular -- Multi-consistency -- Pill -- Cervical Esophageal Comment -- Note populated for Lenore Manner, Student SLP Osie Bond., M.A. [facility] Pager [phone] Office [phone] 06/15/2019, 2:39 PM              ECHOCARDIOGRAM COMPLETE  Result Date: 06/14/2019   ECHOCARDIOGRAM REPORT   Patient Name:   Megan Martinez Date of Exam: 06/14/2019 Medical Rec #:  [id number]        Height:       64.0 in Accession #:    [id number]       Weight:       169.0 lb Date of Birth:  Sep 23, 1950         BSA:          1.82 m Patient Age:    71 years         BP:           114/101 mmHg Patient Gender: F                HR:           77 bpm. Exam Location:  Inpatient Procedure: 2D Echo Indications:    stroke 434.91  History:        Patient has no prior history of Echocardiogram examinations.  Risk Factors:Hypertension and Former Smoker.  Sonographer:    Jannett Celestine RDCS (AE) Referring Phys: [phone] MCNEILL P KIRKPATRICK  Sonographer Comments: restricted mobility IMPRESSIONS  1. Left ventricular ejection fraction, by visual estimation, is 60 to 65%. The left ventricle has normal function. There is no left ventricular hypertrophy.  2. Left ventricular diastolic parameters are consistent with Grade I diastolic dysfunction (impaired relaxation).  3. The left ventricle has no  regional wall motion abnormalities.  4. Global right ventricle has normal systolic function.The right ventricular size is normal. No increase in right ventricular wall thickness.  5. Left atrial size was normal.  6. Right atrial size was normal.  7. Mild mitral annular calcification.  8. The mitral valve is normal in structure. No evidence of mitral valve regurgitation. No evidence of mitral stenosis.  9. The tricuspid valve is normal in structure. Tricuspid valve regurgitation is trivial. 10. The aortic valve is tricuspid. Aortic valve regurgitation is not visualized. No evidence of aortic valve sclerosis or stenosis. 11. The inferior vena cava is normal in size with greater than 50% respiratory variability, suggesting right atrial pressure of 3 mmHg. 12. TR signal is inadequate for assessing pulmonary artery systolic pressure. FINDINGS  Left Ventricle: Left ventricular ejection fraction, by visual estimation, is 60 to 65%. The left ventricle has normal function. The left ventricle has no regional wall motion abnormalities. The left ventricular internal cavity size was the left ventricle is normal in size. There is no left ventricular hypertrophy. Left ventricular diastolic parameters are consistent with Grade I diastolic dysfunction (impaired relaxation). Right Ventricle: The right ventricular size is normal. No increase in right ventricular wall thickness. Global RV systolic function is has normal systolic function. Left Atrium: Left atrial size was normal in size. Right Atrium: Right atrial size was normal in size Pericardium: There is no evidence of pericardial effusion. Mitral Valve: The mitral valve is normal in structure. Mild mitral annular calcification. No evidence of mitral valve regurgitation. No evidence of mitral valve stenosis by observation. Tricuspid Valve: The tricuspid valve is normal in structure. Tricuspid valve regurgitation is trivial. Aortic Valve: The aortic valve is tricuspid. Aortic valve  regurgitation is not visualized. The aortic valve is structurally normal, with no evidence of sclerosis or stenosis. Pulmonic Valve: The pulmonic valve was normal in structure. Pulmonic valve regurgitation is not visualized. Pulmonic regurgitation is not visualized. Aorta: The aortic root is normal in size and structure. Venous: The inferior vena cava is normal in size with greater than 50% respiratory variability, suggesting right atrial pressure of 3 mmHg. IAS/Shunts: No atrial level shunt detected by color flow Doppler.  LEFT VENTRICLE PLAX 2D LVIDd:         3.30 cm  Diastology LVIDs:         1.70 cm  LV e' lateral:   15.20 cm/s LV PW:         1.10 cm  LV E/e' lateral: 4.1 LV IVS:        1.00 cm  LV e' medial:    8.92 cm/s LVOT diam:     1.90 cm  LV E/e' medial:  6.9 LV SV:         36 ml LV SV Index:   18.98 LVOT Area:     2.84 cm  RIGHT VENTRICLE TAPSE (M-mode): 1.9 cm LEFT ATRIUM             Index LA diam:        3.20 cm 1.76 cm/m LA Vol (  A2C):   43.7 ml 23.99 ml/m LA Vol (A4C):   41.2 ml 22.62 ml/m LA Biplane Vol: 42.4 ml 23.28 ml/m  AORTIC VALVE LVOT Vmax:   90.10 cm/s LVOT Vmean:  63.600 cm/s LVOT VTI:    0.261 m  AORTA Ao Root diam: 2.90 cm MITRAL VALVE MV Area (PHT): 2.16 cm             SHUNTS MV PHT:        101.79 msec          Systemic VTI:  0.26 m MV Decel Time: 351 msec             Systemic Diam: 1.90 cm MV E velocity: 61.70 cm/s 103 cm/s MV A velocity: 43.30 cm/s 70.3 cm/s MV E/A ratio:  1.42       1.5  Loralie Champagne MD Electronically signed by Loralie Champagne MD Signature Date/Time: 06/14/2019/5:12:56 PM    Final     PHYSICAL EXAM     Temp:  [97.8 F (36.6 C)-99 F (37.2 C)] 97.8 F (36.6 C) (01/23 0800) Pulse Rate:  [45-71] 55 (01/23 0900) Resp:  [12-29] 21 (01/23 0900) BP: (116-174)/(55-105) 129/66 (01/23 0900) SpO2:  [92 %-97 %] 96 % (01/23 0900)  General - Well nourished, well developed, in no apparent distress.  Ophthalmologic - fundi not visualized due to  noncooperation.  Cardiovascular - Regular rhythm and rate.  Mental Status -  Level of arousal and orientation to time, place, and person were intact. Language including expression, naming, repetition, comprehension was assessed and found intact. Moderate dysarthria Fund of Knowledge was assessed and was intact.  Cranial Nerves II - XII - II - Visual field intact OU. III, IV, VI - Extraocular movements intact. V - Facial sensation intact bilaterally. VII - right facial droop. VIII - Hearing & vestibular intact bilaterally. X - Palate elevates symmetrically. XI - Chin turning intact bilaterally. Right should shrug weaker than left XII - Tongue protrusion intact.  Motor Strength - The patient's strength was normal in Left UE and LE, however, right UE 0/5 and right LLE 3/5 proximal and 0/5 distally.   Motor Tone - Muscle tone was assessed at the neck and appendages and was normal.  Reflexes - The patient's reflexes were decreased on the right UE and LE and she had positive babinski on the right.  Sensory - Light touch, temperature/pinprick were assessed and were symmetrical.    Coordination - The patient had normal movements in the left hand and foot with no ataxia or dysmetria.  Tremor was absent.  Gait and Station - deferred.    ASSESSMENT/PLAN Megan Martinez is a 69 y.o. female with history of HTN and thyroid disease presenting with R sided weakness and dysarthria. Received tPA 06/14/2019 at 0817.  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 ASPECTS 10.    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 Moderate B proximal PCA stenoses. L VA 50% stenosis w/ narrowing distal  V4  CT perfusion no infarct   MRI  L paramedian pontine infarct. High-grade mid BA stenosis.   CT at 24h low density L ponts. No hemorrhage   2D Echo EF 60-65%. No  source of embolus    LDL 137  HgbA1c 5.8  lovenox for VTE prophylaxis  aspirin 81 mg daily prior to admission, now  on aspirin 81 mg daily, clopidogrel 75 mg daily and No antithrombotic following aspirin and Plavix load. Continue DAPT x 3 months then plavix alone.  Therapy recommendations: CIR  Disposition:  pending   BA stenosis  Given the severity, this will need to be intervened  Dr. Estanislado Pandy aware, and plan to discuss with pt and CIR to figure out the best timing for the procedure  Hypertension  Home meds:  norvasc 10, cadesartan-HCTZ 32-25  . Long-term BP goal 130-150 given BA stenosis   Hyperlipidemia   Not on statin PTA  LDL 137, goal < 70  On lipitor 80  No statin allergy as per pt  Continue statin on discharge  Dysphagia, resolved . Secondary to stroke . Cleared for diet by Speech  . On diet and thin liquid   Other Stroke Risk Factors  Advanced age  Former Cigarette smoker, quit 51 yrs ago  ETOH use, alcohol level <10, advised to drink no more than 1 drink(s) a day  Other Active Problems  Hypokalemia 3.0-3.1-3.3 ->3.5 (on HCTZ)  Hypothyroid on synthroid  Bradycardia - [facility]  Hospital day # 2  Rosalin Hawking, MD PhD Stroke Neurology 06/16/2019 7:20 PM  To contact Stroke Continuity provider, please refer to [email or web address] After hours, contact General Neurology

## 2019-06-17 LAB — CBC
HCT: 39.1 % (ref 36.0–46.0)
Hemoglobin: 13.2 g/dL (ref 12.0–15.0)
MCH: 29.3 pg (ref 26.0–34.0)
MCHC: 33.8 g/dL (ref 30.0–36.0)
MCV: 86.7 fL (ref 80.0–100.0)
Platelets: 264 10*3/uL (ref 150–400)
RBC: 4.51 MIL/uL (ref 3.87–5.11)
RDW: 13.1 % (ref 11.5–15.5)
WBC: 6.7 10*3/uL (ref 4.0–10.5)
nRBC: 0 % (ref 0.0–0.2)

## 2019-06-17 LAB — BASIC METABOLIC PANEL
Anion gap: 10 (ref 5–15)
BUN: 10 mg/dL (ref 8–23)
CO2: 25 mmol/L (ref 22–32)
Calcium: 9.1 mg/dL (ref 8.9–10.3)
Chloride: 105 mmol/L (ref 98–111)
Creatinine, Ser: 0.76 mg/dL (ref 0.44–1.00)
GFR calc Af Amer: >60 mL/min (ref >60)
GFR calc non Af Amer: >60 mL/min (ref >60)
Glucose, Bld: 112 mg/dL — ABNORMAL HIGH (ref 70–99)
Potassium: 3.4 mmol/L — ABNORMAL LOW (ref 3.5–5.1)
Sodium: 140 mmol/L (ref 135–145)

## 2019-06-17 MED ORDER — POTASSIUM CHLORIDE CRYS ER 20 MEQ PO TBCR
20.0000 meq | EXTENDED_RELEASE_TABLET | Freq: Every day | ORAL | Status: DC
  Administered 2019-06-17 – 2019-06-20 (×4): 20 meq via ORAL
  Filled 2019-06-17 (×4): qty 1

## 2019-06-17 NOTE — Progress Notes (Signed)
STROKE TEAM PROGRESS NOTE   INTERVAL HISTORY RN at bedside. Pt sitting in chair, neuro stable, still has dysarthria, RUE flaccid, but RLE able to have knee extension against gravity. Pending CIR. Dr. Estanislado Martinez is going to discuss with her about timing for BA stenting.   Vitals:   06/17/19 0000 06/17/19 0348 06/17/19 0700 06/17/19 1100  BP: (!) 145/73 (!) 151/77 (!) 160/68 (!) 153/66  Pulse: (!) 59 62 (!) 58 (!) 57  Resp: 17 17 18 18   Temp: 98.4 F (36.9 C) 98.4 F (36.9 C) 98 F (36.7 C) (!) 97.5 F (36.4 C)  TempSrc: Oral Oral Oral Oral  SpO2: 95% 98% 96% 97%  Weight:      Height:        CBC:  Recent Labs  Lab 06/14/19 0719 06/14/19 0725 06/16/19 0412 06/17/19 0509  WBC 5.4   < > 7.5 6.7  NEUTROABS 3.2  --   --   --   HGB 14.6   < > 13.2 13.2  HCT 43.9   < > 38.6 39.1  MCV 86.6   < > 86.2 86.7  PLT 295   < > 268 264   < > = values in this interval not displayed.    Basic Metabolic Panel:  Recent Labs  Lab 06/16/19 0412 06/17/19 0509  NA 140 140  K 3.5 3.4*  CL 106 105  CO2 24 25  GLUCOSE 106* 112*  BUN 9 10  CREATININE 0.81 0.76  CALCIUM 9.1 9.1   Lipid Panel:     Component Value Date/Time   CHOL 204 (H) 06/15/2019 0644   TRIG 161 (H) 06/15/2019 0644   HDL 35 (L) 06/15/2019 0644   CHOLHDL 5.8 06/15/2019 0644   VLDL 32 06/15/2019 0644   LDLCALC 137 (H) 06/15/2019 0644   HgbA1c:  Lab Results  Component Value Date   HGBA1C 5.8 (H) 06/15/2019   Urine Drug Screen:     Component Value Date/Time   LABOPIA NONE DETECTED 06/14/2019 1252   COCAINSCRNUR NONE DETECTED 06/14/2019 1252   LABBENZ NONE DETECTED 06/14/2019 1252   AMPHETMU NONE DETECTED 06/14/2019 1252   THCU NONE DETECTED 06/14/2019 1252   LABBARB NONE DETECTED 06/14/2019 1252    Alcohol Level     Component Value Date/Time   ETH <10 06/14/2019 0719    IMAGING past 48 hours No results found.  PHYSICAL EXAM     Temp:  [97.5 F (36.4 C)-98.4 F (36.9 C)] 97.5 F (36.4 C) (01/24  1100) Pulse Rate:  [44-62] 57 (01/24 1100) Resp:  [15-24] 18 (01/24 1100) BP: (121-160)/(58-83) 153/66 (01/24 1100) SpO2:  [95 %-98 %] 97 % (01/24 1100)  General - Well nourished, well developed, in no apparent distress.  Ophthalmologic - fundi not visualized due to noncooperation.  Cardiovascular - Regular rhythm and rate.  Mental Status -  Level of arousal and orientation to time, place, and person were intact. Language including expression, naming, repetition, comprehension was assessed and found intact. Moderate dysarthria Fund of Knowledge was assessed and was intact.  Cranial Nerves II - XII - II - Visual field intact OU. III, IV, VI - Extraocular movements intact. V - Facial sensation intact bilaterally. VII - right facial droop. VIII - Hearing & vestibular intact bilaterally. X - Palate elevates symmetrically. XI - Chin turning intact bilaterally. Right should shrug weaker than left XII - Tongue protrusion intact.  Motor Strength - The patient's strength was normal in Left UE and LE, however, right UE  0/5 and right LLE 3-/5 proximal and knee extension, and 0/5 distally.   Motor Tone - Muscle tone was assessed at the neck and appendages and was normal.  Reflexes - The patient's reflexes were decreased on the right UE and LE and she had positive babinski on the right.  Sensory - Light touch, temperature/pinprick were assessed and were symmetrical.    Coordination - The patient had normal movements in the left hand and foot with no ataxia or dysmetria.  Tremor was absent.  Gait and Station - deferred.    ASSESSMENT/PLAN Megan Martinez is a 69 y.o. female with history of HTN and thyroid disease presenting with R sided weakness and dysarthria. Received tPA 06/14/2019 at 0817.  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 ASPECTS  10.    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 Moderate B proximal PCA stenoses. L VA 50% stenosis w/ narrowing distal  V4  CT perfusion no infarct   MRI  L paramedian pontine infarct. High-grade mid BA stenosis.   CT at 24h low density L ponts. No hemorrhage   2D Echo EF 60-65%. No source of embolus    LDL 137  HgbA1c 5.8  lovenox for VTE prophylaxis  aspirin 81 mg daily prior to admission, now on aspirin 81 mg daily, clopidogrel 75 mg daily and No antithrombotic following aspirin and Plavix load. Continue DAPT x 3 months then plavix alone.  Therapy recommendations: CIR  Disposition:  pending   BA stenosis  Given the severity, this will need to be intervened  Dr. Estanislado Martinez aware, and plan to discuss with pt and CIR in am to figure out the best timing for the procedure  Hypertension  Home meds:  norvasc 10, cadesartan-HCTZ 32-25   Stable now 145-160 . Long-term BP goal 130-150 given BA stenosis   Hyperlipidemia   Not on statin PTA  LDL 137, goal < 70  On lipitor 80  No statin allergy as per pt  Continue statin on discharge  Dysphagia, resolved . Secondary to stroke . Cleared for diet by Speech  . On diet and thin liquid   Other Stroke Risk Factors  Advanced age  Former Cigarette smoker, quit 51 yrs ago  ETOH use, alcohol level <10, advised to drink no more than 1 drink(s) a day  Other Active Problems  Hypokalemia 3.0-3.1-3.3 -3.5->3.4 on daily potassium supplement now  Hypothyroid on synthroid  Brad[facility]cardia - [facility]   Hospital day # 3   Megan Hawking, MD PhD Stroke Neurology 06/17/2019 5:39 PM   To contact Stroke Continuity provider, please refer to [email or web address] After hours, contact General Neurology

## 2019-06-17 NOTE — Progress Notes (Signed)
Physical Therapy Treatment Patient Details Name: Megan Martinez MRN: [id number] DOB: [birth year] Today's Date: 06/17/2019    History of Present Illness 69 yo admitted with acute right sided weakness upon waking up. Pt s/p tPA with acute pontine infarct. PMHx: HTN, arthritis    PT Comments    Pt up in chair upon arrival with long term boyfriend present. She was able to demonstrate the exercises she has been doing with her R UE/R LE using her L side to A. Pt able to ambulate 5' x2 with RW and MOD A of 2 with chair follow. She had difficulty with gripping of RW and will benefit from support on R hand to A with this.  Added more exercises she could do while up in the chair.  She is very motivated and had good carryover from previous therapy session.  Pt is excellent candidate for CIR.   Follow Up Recommendations  CIR     Equipment Recommendations  Other (comment)(to be determined in next venue)    Recommendations for Other Services       Precautions / Restrictions Precautions Precautions: Fall Precaution Comments: right hemiparesis Restrictions Weight Bearing Restrictions: No    Mobility  Bed Mobility               General bed mobility comments: up in recliner upon arrival  Transfers Overall transfer level: Needs assistance Equipment used: Rolling walker (2 wheeled) Transfers: Sit to/from Stand Sit to Stand: Min assist;Mod assist;+2 physical assistance         General transfer comment: MIN/MOD A of 2 to stand with A at R UE to stay on RW. Cues for L hand placement and transition. R knee blocked initially, but no buckeling in standing today.  Ambulation/Gait Ambulation/Gait assistance: Mod assist;+2 physical assistance;+2 safety/equipment Gait Distance (Feet): 5 Feet(x2) Assistive device: Rolling walker (2 wheeled) Gait Pattern/deviations: Decreased dorsiflexion - right;Decreased step length - left;Narrow base of support Gait velocity: decreased   General Gait  Details: Pt did two rounds of gait with recliner follow. She needed cues for sequencing and A for keeping R UE on RW and some A maneuvering RW in general.  Increased R LE weakness noted on 2nd gait trial, but no overt buckeling, just weakness and "soft" knee   Stairs             Wheelchair Mobility    Modified Rankin (Stroke Patients Only) Modified Rankin (Stroke Patients Only) Pre-Morbid Rankin Score: No symptoms Modified Rankin: Severe disability     Balance Overall balance assessment: Needs assistance         Standing balance support: Bilateral upper extremity supported Standing balance-Leahy Scale: Poor Standing balance comment: bil UE support with RLE supported                            Cognition Arousal/Alertness: Awake/alert Behavior During Therapy: WFL for tasks assessed/performed Overall Cognitive Status: Within Functional Limits for tasks assessed                                        Exercises General Exercises - Lower Extremity Quad Sets: Strengthening;Right;10 reps Long Arc Quad: AROM;Strengthening;Right;10 reps Heel Slides: AAROM;Right(with gait belt/ L LE A) Hip ABduction/ADduction: AAROM;Right;10 reps(with gait belt assist) Other Exercises Other Exercises: heel cord stretching with gait belt    General Comments  Pertinent Vitals/Pain Pain Assessment: No/denies pain    Home Living                      Prior Function            PT Goals (current goals can now be found in the care plan section) Acute Rehab PT Goals Potential to Achieve Goals: Good Progress towards PT goals: Progressing toward goals    Frequency    Min 4X/week      PT Plan Current plan remains appropriate    Co-evaluation              AM-PAC PT "6 Clicks" Mobility   Outcome Measure  Help needed turning from your back to your side while in a flat bed without using bedrails?: A Lot Help needed moving from lying  on your back to sitting on the side of a flat bed without using bedrails?: A Lot Help needed moving to and from a bed to a chair (including a wheelchair)?: A Lot Help needed standing up from a chair using your arms (e.g., wheelchair or bedside chair)?: A Lot Help needed to walk in hospital room?: A Lot Help needed climbing 3-5 steps with a railing? : Total 6 Click Score: 11    End of Session Equipment Utilized During Treatment: Gait belt Activity Tolerance: Patient tolerated treatment well Patient left: in chair;with call bell/phone within reach;with chair alarm set;with family/visitor present   PT Visit Diagnosis: Hemiplegia and hemiparesis;Other symptoms and signs involving the nervous system (R29.898) Hemiplegia - Right/Left: Right Hemiplegia - dominant/non-dominant: Dominant Hemiplegia - caused by: Cerebral infarction     Time: [id number] PT Time Calculation (min) (ACUTE ONLY): 23 min  Charges:  $Gait Training: 8-22 mins $Therapeutic Exercise: 8-22 mins                     Megan Martinez, [place] Pager [phone] 06/17/2019    Galen Manila 06/17/2019, 2:01 PM

## 2019-06-18 DIAGNOSIS — I6350 Cerebral infarction due to unspecified occlusion or stenosis of unspecified cerebral artery: Secondary | ICD-10-CM

## 2019-06-18 LAB — CBC
HCT: 38.6 % (ref 36.0–46.0)
Hemoglobin: 13.1 g/dL (ref 12.0–15.0)
MCH: 29.2 pg (ref 26.0–34.0)
MCHC: 33.9 g/dL (ref 30.0–36.0)
MCV: 86 fL (ref 80.0–100.0)
Platelets: 251 10*3/uL (ref 150–400)
RBC: 4.49 MIL/uL (ref 3.87–5.11)
RDW: 13.1 % (ref 11.5–15.5)
WBC: 7.2 10*3/uL (ref 4.0–10.5)
nRBC: 0 % (ref 0.0–0.2)

## 2019-06-18 LAB — BASIC METABOLIC PANEL
Anion gap: 12 (ref 5–15)
BUN: 9 mg/dL (ref 8–23)
CO2: 24 mmol/L (ref 22–32)
Calcium: 9 mg/dL (ref 8.9–10.3)
Chloride: 106 mmol/L (ref 98–111)
Creatinine, Ser: 0.88 mg/dL (ref 0.44–1.00)
GFR calc Af Amer: >60 mL/min (ref >60)
GFR calc non Af Amer: >60 mL/min (ref >60)
Glucose, Bld: 103 mg/dL — ABNORMAL HIGH (ref 70–99)
Potassium: 3.5 mmol/L (ref 3.5–5.1)
Sodium: 142 mmol/L (ref 135–145)

## 2019-06-18 MED ORDER — ASPIRIN EC 325 MG PO TBEC
325.0000 mg | DELAYED_RELEASE_TABLET | Freq: Every day | ORAL | Status: DC
  Administered 2019-06-19 – 2019-06-20 (×2): 325 mg via ORAL
  Filled 2019-06-18 (×2): qty 1

## 2019-06-18 NOTE — Progress Notes (Signed)
Inpatient Rehabilitation Admissions Coordinator  I met with patient at bedside to discuss goals and expectations of an inpt rehab admit. She is in agreement. I contacted Dr. Erlinda Hong and met with Dr. Estanislado Pandy. Will plan CIR admit and then pt will receive Stent at end of CIR stay and then d/c home from ICU after stent. I will begin insurance authorization with [employer]. Admit pending their approval. Patient has 24/7 assist after CIR admit. I will follow up tomorrow.  Danne Baxter, RN, MSN Rehab Admissions Coordinator [phone] 06/18/2019 1:17 PM

## 2019-06-18 NOTE — Discharge Summary (Addendum)
Stroke Discharge Summary  Patient ID: Megan Martinez   MRN: [id number]      DOB: 11/29/1950  Date of Admission: 06/14/2019 Date of Discharge: 06/20/2019     Attending Physician:  Rosalin Hawking, MD, Stroke MD Consultant(s):   Alysia Penna, MD (Physical Medicine & Rehabtilitation)  Patient's PCP:  [name], DO  Discharge Diagnoses:  Principal Problem:   Basilar artery stenosis with infarction [facility]) s/p tPA Active Problems:   Essential hypertension   Mixed hyperlipidemia   Postablative hypothyroidism  Medications to be continued on Rehab Allergies as of 06/20/2019      Reactions   Latex Itching, Rash   Statins Other (See Comments)   Myalgias, "sick"   Penicillin G    Did it involve swelling of the face/tongue/throat, SOB, or low BP? No Did it involve sudden or severe rash/hives, skin peeling, or any reaction on the inside of your mouth or nose? Yes Did you need to seek medical attention at a hospital or doctor's office? N/A When did it last happen?Child If all above answers are "NO", may proceed with cephalosporin use.   Amoxicillin Rash   Did it involve swelling of the face/tongue/throat, SOB, or low BP? No Did it involve sudden or severe rash/hives, skin peeling, or any reaction on the inside of your mouth or nose? Yes Did you need to seek medical attention at a hospital or doctor's office? N/A When did it last happen? Child If all above answers are "NO", may proceed with cephalosporin use.   Codeine Nausea Only      Medication List    STOP taking these medications   amLODipine 10 MG tablet Commonly known as: NORVASC   Candesartan Cilexetil-HCTZ 32-25 MG Tabs   HYDROcodone-homatropine 5-1.5 MG/5ML syrup Commonly known as: HYCODAN   levocetirizine 5 MG tablet Commonly known as: XYZAL   predniSONE 10 MG (21) Tbpk tablet Commonly known as: STERAPRED UNI-PAK 21 TAB   triamcinolone cream 0.1 % Commonly known as: KENALOG     TAKE these medications    aspirin 325 MG EC tablet Take 1 tablet (325 mg total) by mouth daily.   atorvastatin 80 MG tablet Commonly known as: LIPITOR Take 1 tablet (80 mg total) by mouth daily at 6 PM.   Chlorhexidine Gluconate Cloth 2 % Pads Apply 6 each topically daily.   clopidogrel 75 MG tablet Commonly known as: PLAVIX Take 1 tablet (75 mg total) by mouth daily.   enoxaparin 40 MG/0.4ML injection Commonly known as: LOVENOX Inject 0.4 mLs (40 mg total) into the skin daily.   fluticasone 50 MCG/ACT nasal spray Commonly known as: FLONASE Place 2 sprays into both nostrils daily.   levothyroxine 137 MCG tablet Commonly known as: SYNTHROID Take 1 tablet (137 mcg total) by mouth daily at 6 (six) AM. What changed: when to take this   potassium chloride SA 20 MEQ tablet Commonly known as: KLOR-CON Take 1 tablet (20 mEq total) by mouth daily.      LABORATORY STUDIES CBC    Component Value Date/Time   WBC 6.0 06/20/2019 0417   RBC 4.68 06/20/2019 0417   HGB 13.6 06/20/2019 0417   HCT 40.9 06/20/2019 0417   PLT 252 06/20/2019 0417   MCV 87.4 06/20/2019 0417   MCH 29.1 06/20/2019 0417   MCHC 33.3 06/20/2019 0417   RDW 13.4 06/20/2019 0417   LYMPHSABS 1.6 06/14/2019 0719   MONOABS 0.4 06/14/2019 0719   EOSABS 0.2 06/14/2019 0719   BASOSABS  0.1 06/14/2019 0719   CMP    Component Value Date/Time   NA 141 06/20/2019 0417   K 3.7 06/20/2019 0417   CL 107 06/20/2019 0417   CO2 25 06/20/2019 0417   GLUCOSE 99 06/20/2019 0417   BUN 15 06/20/2019 0417   CREATININE 0.93 06/20/2019 0417   CALCIUM 9.1 06/20/2019 0417   PROT 6.5 06/14/2019 0719   ALBUMIN 3.9 06/14/2019 0719   AST 19 06/14/2019 0719   ALT 19 06/14/2019 0719   ALKPHOS 38 06/14/2019 0719   BILITOT 0.5 06/14/2019 0719   GFRNONAA >60 06/20/2019 0417   GFRAA >60 06/20/2019 0417   COAGS Lab Results  Component Value Date   INR 1.0 06/14/2019   Lipid Panel    Component Value Date/Time   CHOL 204 (H) 06/15/2019 0644   TRIG  161 (H) 06/15/2019 0644   HDL 35 (L) 06/15/2019 0644   CHOLHDL 5.8 06/15/2019 0644   VLDL 32 06/15/2019 0644   LDLCALC 137 (H) 06/15/2019 0644   HgbA1C  Lab Results  Component Value Date   HGBA1C 5.8 (H) 06/15/2019   Urinalysis    Component Value Date/Time   COLORURINE YELLOW 06/14/2019 1252   APPEARANCEUR HAZY (A) 06/14/2019 1252   LABSPEC >1.046 (H) 06/14/2019 1252   PHURINE 7.0 06/14/2019 1252   GLUCOSEU NEGATIVE 06/14/2019 1252   HGBUR NEGATIVE 06/14/2019 1252   BILIRUBINUR NEGATIVE 06/14/2019 1252   KETONESUR NEGATIVE 06/14/2019 1252   PROTEINUR NEGATIVE 06/14/2019 1252   NITRITE NEGATIVE 06/14/2019 1252   LEUKOCYTESUR LARGE (A) 06/14/2019 1252   Urine Drug Screen     Component Value Date/Time   LABOPIA NONE DETECTED 06/14/2019 1252   COCAINSCRNUR NONE DETECTED 06/14/2019 1252   LABBENZ NONE DETECTED 06/14/2019 1252   AMPHETMU NONE DETECTED 06/14/2019 1252   THCU NONE DETECTED 06/14/2019 1252   LABBARB NONE DETECTED 06/14/2019 1252    Alcohol Level    Component Value Date/Time   ETH <10 06/14/2019 0719    SIGNIFICANT DIAGNOSTIC STUDIES CT Code Stroke CTA Head W/WO contrast  Result Date: 06/14/2019 CLINICAL DATA:  Ataxia.  Facial droop. EXAM: CT ANGIOGRAPHY HEAD AND NECK CT PERFUSION BRAIN TECHNIQUE: Multidetector CT imaging of the head and neck was performed using the standard protocol during bolus administration of intravenous contrast. Multiplanar CT image reconstructions and MIPs were obtained to evaluate the vascular anatomy. Carotid stenosis measurements (when applicable) are obtained utilizing NASCET criteria, using the distal internal carotid diameter as the denominator. Multiphase CT imaging of the brain was performed following IV bolus contrast injection. Subsequent parametric perfusion maps were calculated using RAPID software. CONTRAST:  112mL OMNIPAQUE IOHEXOL 350 MG/ML SOLN COMPARISON:  CT head without contrast of the same day. FINDINGS: CTA NECK  FINDINGS Aortic arch: There is common origin of the left common carotid artery and the innominate artery. Aortic arch and great vessel origins are within normal limits otherwise. No atherosclerotic change, stenosis, or aneurysm is present. Right carotid system: The right common carotid artery is within normal limits. Bifurcation is unremarkable. Cervical right ICA is normal. Left carotid system: The left common carotid artery is within normal limits. Mild atherosclerotic changes are noted in the proximal left ICA without stenosis significant stenosis relative to the more distal vessel. Vertebral arteries: The left vertebral artery is the dominant vessel. Both vertebral arteries originate from the subclavian arteries without significant stenosis. There is no significant stenosis of either vertebral artery in the neck. Skeleton: Degenerative anterolisthesis present C3-4 and C4-5. Endplate changes contribute to  mild foraminal narrowing bilaterally at C5-6. No focal lytic or blastic lesions are present. Other neck: Neck soft tissues are otherwise within normal limits. Upper chest: Lung apices are clear. Thoracic inlet is within normal limits. Review of the MIP images confirms the above findings CTA HEAD FINDINGS Anterior circulation: Atherosclerotic changes are present within the cavernous internal carotid arteries bilaterally without significant stenosis through the ICA termini. Left posterior communicating artery is noted. The A1 and M1 segments are normal. Left A1 segment is dominant. The anterior communicating artery is patent. MCA bifurcations are within normal limits bilaterally. The ACA and MCA branch vessels are within normal limits. Posterior circulation: Atherosclerotic calcifications are present at the dural margin of the left vertebral artery. 50% stenosis is present. Is some narrowing of distal vertebral arteries scratched at there is some narrowing of distal V4 segments bilaterally. The proximal basilar  artery is small. A high-grade mid basilar stenosis is present at the level of the pons. There is flow in the distal basilar artery. A left posterior communicating artery contributes. Moderate proximal PCA stenoses are present bilaterally. There is attenuation of distal PCA branch vessels, right greater than left. Venous sinuses: The dural sinuses are patent. The straight sinus deep cerebral veins are intact. Cortical veins are unremarkable. No vascular malformations are present. Anatomic variants: Prominent left posterior communicating artery. Review of the MIP images confirms the above findings CT Brain Perfusion Findings: ASPECTS: 10/10 CBF (<30%) Volume: 21mL Perfusion (Tmax>6.0s) volume: 68mL. There is some indication of ischemia in the cerebellum when a shorter T-max is used. Mismatch Volume: 48mL Infarction Location:Posterior fossa ischemia IMPRESSION: 1. 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 2. Moderate proximal PCA stenoses bilaterally. 3. 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 4. CT perfusion demonstrates no anterior circulation infarct or ischemia. 5. Question ischemia within the cerebellum. 6. Atherosclerotic changes at the left proximal ICA and bilateral cavernous internal carotid arteries without significant stenosis in the anterior circulation. These results were called by telephone at the time of interpretation on 06/14/2019 at 7:58 am to provider MCNEILL [facility] , who verbally acknowledged these results. Electronically Signed   By: San Morelle M.D.   On: 06/14/2019 08:13   CT HEAD WO CONTRAST  Result Date: 06/15/2019 CLINICAL DATA:  Follow-up stroke.  24 hours post tPA. EXAM: CT HEAD WITHOUT CONTRAST TECHNIQUE: Contiguous axial images were obtained from the base of the skull through the vertex without intravenous contrast. COMPARISON:  MRI and CT studies done yesterday. FINDINGS: Brain: Extensive  low-density within the left side of the pons. No hemorrhagic transformation. No focal cerebellar finding. Cerebral hemispheres are normal. No hydrocephalus or extra-axial collection. Vascular: There is atherosclerotic calcification of the major vessels at the base of the brain. Skull: Negative Sinuses/Orbits: Clear/normal Other: None IMPRESSION: Low-density throughout the left side of the pons consistent with the acute infarction. No hemorrhagic transformation. No hydrocephalus. Electronically Signed   By: [name] Chimes M.D.   On: 06/15/2019 08:08   CT Code Stroke CTA Neck W/WO contrast  Result Date: 06/14/2019 CLINICAL DATA:  Ataxia.  Facial droop. EXAM: CT ANGIOGRAPHY HEAD AND NECK CT PERFUSION BRAIN TECHNIQUE: Multidetector CT imaging of the head and neck was performed using the standard protocol during bolus administration of intravenous contrast. Multiplanar CT image reconstructions and MIPs were obtained to evaluate the vascular anatomy. Carotid stenosis measurements (when applicable) are obtained utilizing NASCET criteria, using the distal internal carotid diameter as  the denominator. Multiphase CT imaging of the brain was performed following IV bolus contrast injection. Subsequent parametric perfusion maps were calculated using RAPID software. CONTRAST:  184mL OMNIPAQUE IOHEXOL 350 MG/ML SOLN COMPARISON:  CT head without contrast of the same day. FINDINGS: CTA NECK FINDINGS Aortic arch: There is common origin of the left common carotid artery and the innominate artery. Aortic arch and great vessel origins are within normal limits otherwise. No atherosclerotic change, stenosis, or aneurysm is present. Right carotid system: The right common carotid artery is within normal limits. Bifurcation is unremarkable. Cervical right ICA is normal. Left carotid system: The left common carotid artery is within normal limits. Mild atherosclerotic changes are noted in the proximal left ICA without stenosis significant  stenosis relative to the more distal vessel. Vertebral arteries: The left vertebral artery is the dominant vessel. Both vertebral arteries originate from the subclavian arteries without significant stenosis. There is no significant stenosis of either vertebral artery in the neck. Skeleton: Degenerative anterolisthesis present C3-4 and C4-5. Endplate changes contribute to mild foraminal narrowing bilaterally at C5-6. No focal lytic or blastic lesions are present. Other neck: Neck soft tissues are otherwise within normal limits. Upper chest: Lung apices are clear. Thoracic inlet is within normal limits. Review of the MIP images confirms the above findings CTA HEAD FINDINGS Anterior circulation: Atherosclerotic changes are present within the cavernous internal carotid arteries bilaterally without significant stenosis through the ICA termini. Left posterior communicating artery is noted. The A1 and M1 segments are normal. Left A1 segment is dominant. The anterior communicating artery is patent. MCA bifurcations are within normal limits bilaterally. The ACA and MCA branch vessels are within normal limits. Posterior circulation: Atherosclerotic calcifications are present at the dural margin of the left vertebral artery. 50% stenosis is present. Is some narrowing of distal vertebral arteries scratched at there is some narrowing of distal V4 segments bilaterally. The proximal basilar artery is small. A high-grade mid basilar stenosis is present at the level of the pons. There is flow in the distal basilar artery. A left posterior communicating artery contributes. Moderate proximal PCA stenoses are present bilaterally. There is attenuation of distal PCA branch vessels, right greater than left. Venous sinuses: The dural sinuses are patent. The straight sinus deep cerebral veins are intact. Cortical veins are unremarkable. No vascular malformations are present. Anatomic variants: Prominent left posterior communicating artery.  Review of the MIP images confirms the above findings CT Brain Perfusion Findings: ASPECTS: 10/10 CBF (<30%) Volume: 29mL Perfusion (Tmax>6.0s) volume: 60mL. There is some indication of ischemia in the cerebellum when a shorter T-max is used. Mismatch Volume: 80mL Infarction Location:Posterior fossa ischemia IMPRESSION: 1. 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 2. Moderate proximal PCA stenoses bilaterally. 3. 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 4. CT perfusion demonstrates no anterior circulation infarct or ischemia. 5. Question ischemia within the cerebellum. 6. Atherosclerotic changes at the left proximal ICA and bilateral cavernous internal carotid arteries without significant stenosis in the anterior circulation. These results were called by telephone at the time of interpretation on 06/14/2019 at 7:58 am to provider MCNEILL [facility] , who verbally acknowledged these results. Electronically Signed   By: San Morelle M.D.   On: 06/14/2019 08:13   MR BRAIN WO CONTRAST  Result Date: 06/14/2019 CLINICAL DATA:  Wake up stroke. Ataxia and facial droop. EXAM: MRI HEAD WITHOUT CONTRAST TECHNIQUE: Multiplanar, multiecho pulse sequences of the brain and surrounding  structures were obtained without intravenous contrast. COMPARISON:  CT head, CTA, and CT perfusion 06/14/2019 FINDINGS: Brain: Diffusion-weighted images demonstrate restricted diffusion in the left paramedian pons. No significant T2 or FLAIR signal is associated. There may be some diffusion abnormality in the midbrain. No acute hemorrhage is present. Minimal white matter disease is present otherwise, likely within normal limits for age. The ventricles are of normal size. No significant extraaxial fluid collection is present. Vascular: Abnormal flow signal is present in mid basilar artery consistent with known high-grade stenosis. Skull and upper cervical spine:  The craniocervical junction is normal. Upper cervical spine is within normal limits. Marrow signal is unremarkable. Sinuses/Orbits: The paranasal sinuses and mastoid air cells are clear. Bilateral lens replacements are noted. Globes and orbits are otherwise unremarkable. IMPRESSION: 1. 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 2. 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 3. No other acute intracranial abnormality. These results were called by telephone at the time of interpretation on 06/14/2019 at 8:07am to provider MCNEILL [facility] , who verbally acknowledged these results. Electronically Signed   By: San Morelle M.D.   On: 06/14/2019 08:17   CT Code Stroke Cerebral Perfusion with contrast  Result Date: 06/14/2019 CLINICAL DATA:  Ataxia.  Facial droop. EXAM: CT ANGIOGRAPHY HEAD AND NECK CT PERFUSION BRAIN TECHNIQUE: Multidetector CT imaging of the head and neck was performed using the standard protocol during bolus administration of intravenous contrast. Multiplanar CT image reconstructions and MIPs were obtained to evaluate the vascular anatomy. Carotid stenosis measurements (when applicable) are obtained utilizing NASCET criteria, using the distal internal carotid diameter as the denominator. Multiphase CT imaging of the brain was performed following IV bolus contrast injection. Subsequent parametric perfusion maps were calculated using RAPID software. CONTRAST:  132mL OMNIPAQUE IOHEXOL 350 MG/ML SOLN COMPARISON:  CT head without contrast of the same day. FINDINGS: CTA NECK FINDINGS Aortic arch: There is common origin of the left common carotid artery and the innominate artery. Aortic arch and great vessel origins are within normal limits otherwise. No atherosclerotic change, stenosis, or aneurysm is present. Right carotid system: The right common carotid artery is within normal limits. Bifurcation is unremarkable. Cervical right  ICA is normal. Left carotid system: The left common carotid artery is within normal limits. Mild atherosclerotic changes are noted in the proximal left ICA without stenosis significant stenosis relative to the more distal vessel. Vertebral arteries: The left vertebral artery is the dominant vessel. Both vertebral arteries originate from the subclavian arteries without significant stenosis. There is no significant stenosis of either vertebral artery in the neck. Skeleton: Degenerative anterolisthesis present C3-4 and C4-5. Endplate changes contribute to mild foraminal narrowing bilaterally at C5-6. No focal lytic or blastic lesions are present. Other neck: Neck soft tissues are otherwise within normal limits. Upper chest: Lung apices are clear. Thoracic inlet is within normal limits. Review of the MIP images confirms the above findings CTA HEAD FINDINGS Anterior circulation: Atherosclerotic changes are present within the cavernous internal carotid arteries bilaterally without significant stenosis through the ICA termini. Left posterior communicating artery is noted. The A1 and M1 segments are normal. Left A1 segment is dominant. The anterior communicating artery is patent. MCA bifurcations are within normal limits bilaterally. The ACA and MCA branch vessels are within normal limits. Posterior circulation: Atherosclerotic calcifications are present at the dural margin of the left vertebral artery. 50% stenosis is present. Is some narrowing of distal vertebral arteries scratched at there is some narrowing of  distal V4 segments bilaterally. The proximal basilar artery is small. A high-grade mid basilar stenosis is present at the level of the pons. There is flow in the distal basilar artery. A left posterior communicating artery contributes. Moderate proximal PCA stenoses are present bilaterally. There is attenuation of distal PCA branch vessels, right greater than left. Venous sinuses: The dural sinuses are patent. The  straight sinus deep cerebral veins are intact. Cortical veins are unremarkable. No vascular malformations are present. Anatomic variants: Prominent left posterior communicating artery. Review of the MIP images confirms the above findings CT Brain Perfusion Findings: ASPECTS: 10/10 CBF (<30%) Volume: 49mL Perfusion (Tmax>6.0s) volume: 19mL. There is some indication of ischemia in the cerebellum when a shorter T-max is used. Mismatch Volume: 46mL Infarction Location:Posterior fossa ischemia IMPRESSION: 1. 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 2. Moderate proximal PCA stenoses bilaterally. 3. 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 4. CT perfusion demonstrates no anterior circulation infarct or ischemia. 5. Question ischemia within the cerebellum. 6. Atherosclerotic changes at the left proximal ICA and bilateral cavernous internal carotid arteries without significant stenosis in the anterior circulation. These results were called by telephone at the time of interpretation on 06/14/2019 at 7:58 am to provider MCNEILL [facility] , who verbally acknowledged these results. Electronically Signed   By: San Morelle M.D.   On: 06/14/2019 08:13   DG Swallowing Func-Speech Pathology  Result Date: 06/15/2019 Objective Swallowing Evaluation: Type of Study: MBS-Modified Barium Swallow Study  Patient Details Name: Megan Martinez MRN: [id number] Date of Birth: 1950/10/29 Today's Date: 06/15/2019 Time: SLP Start Time (ACUTE ONLY): 0950 -SLP Stop Time (ACUTE ONLY): 1010 SLP Time Calculation (min) (ACUTE ONLY): 20 min Past Medical History: Past Medical History: Diagnosis Date . Allergy  . Arthritis  . Hypertension  . Thyroid disease  Past Surgical History: Past Surgical History: Procedure Laterality Date . ABDOMINAL HYSTERECTOMY   . APPENDECTOMY   . CATARACT EXTRACTION   . CHOLECYSTECTOMY   . TUBAL LIGATION   HPI:  69 y.o. female with a history  of hypertension admitted with right sided hemiparesis and facial asymmetry; received IV TPA.  Dx acute left paramedian pontine CVA.  Subjective: alert, cooporative Assessment / Plan / Recommendation CHL IP CLINICAL IMPRESSIONS 06/15/2019 Clinical Impression Patient presents with mild oropharyngeal dysphagia characterized by silent aspiration with thin liquids. Oral phase remarkable for reduced lingual strength resulting in lingual residue. Moments of clearing residue resulted in episodes of impaired timing. This along with reduced sensation resulted in silent aspiration of thin liquids. As pt utilized chin tuck with straw for thin liquids, no penetration/aspiration observed. With pt's motivation and utilization of compensatory strategies, good progress is suspected. Dysphagia 3 diet and thin liquids utilizing chin tuck recommended, with intermittent staff supervision. SLP Visit Diagnosis Dysarthria and anarthria (R47.1);Dysphagia, oropharyngeal phase (R13.12) Attention and concentration deficit following -- Frontal lobe and executive function deficit following -- Impact on safety and function Mild aspiration risk;Moderate aspiration risk   CHL IP TREATMENT RECOMMENDATION 06/15/2019 Treatment Recommendations Therapy as outlined in treatment plan below   Prognosis 06/15/2019 Prognosis for Safe Diet Advancement Good Barriers to Reach Goals -- Barriers/Prognosis Comment -- CHL IP DIET RECOMMENDATION 06/15/2019 SLP Diet Recommendations Thin liquid;Dysphagia 3 (Mech soft) solids Liquid Administration via Straw Medication Administration Whole meds with puree Compensations Chin tuck;Use straw to facilitate chin tuck Postural Changes Seated upright at 90 degrees   CHL IP OTHER RECOMMENDATIONS 06/15/2019 Recommended  Consults -- Oral Care Recommendations Oral care BID Other Recommendations Order thickener from pharmacy   CHL IP FOLLOW UP RECOMMENDATIONS 06/15/2019 Follow up Recommendations Inpatient Rehab   CHL IP FREQUENCY AND  DURATION 06/15/2019 Speech Therapy Frequency (ACUTE ONLY) min 2x/week Treatment Duration 2 weeks      CHL IP ORAL PHASE 06/15/2019 Oral Phase Impaired Oral - Pudding Teaspoon -- Oral - Pudding Cup -- Oral - Honey Teaspoon -- Oral - Honey Cup -- Oral - Nectar Teaspoon -- Oral - Nectar Cup -- Oral - Nectar Straw -- Oral - Thin Teaspoon -- Oral - Thin Cup -- Oral - Thin Straw Lingual/palatal residue Oral - Puree Lingual/palatal residue;Premature spillage Oral - Mech Soft Lingual/palatal residue Oral - Regular -- Oral - Multi-Consistency -- Oral - Pill -- Oral Phase - Comment --  CHL IP PHARYNGEAL PHASE 06/15/2019 Pharyngeal Phase Impaired Pharyngeal- Pudding Teaspoon -- Pharyngeal -- Pharyngeal- Pudding Cup -- Pharyngeal -- Pharyngeal- Honey Teaspoon -- Pharyngeal -- Pharyngeal- Honey Cup -- Pharyngeal -- Pharyngeal- Nectar Teaspoon -- Pharyngeal -- Pharyngeal- Nectar Cup -- Pharyngeal -- Pharyngeal- Nectar Straw -- Pharyngeal -- Pharyngeal- Thin Teaspoon -- Pharyngeal -- Pharyngeal- Thin Cup -- Pharyngeal -- Pharyngeal- Thin Straw Penetration/Aspiration during swallow;Trace aspiration;Pharyngeal residue - pyriform;Pharyngeal residue - valleculae;Reduced airway/laryngeal closure;Compensatory strategies attempted (with notebox) Pharyngeal Material enters airway, remains ABOVE vocal cords then ejected out Pharyngeal- Puree Reduced epiglottic inversion;Trace aspiration;Penetration/Aspiration during swallow;Reduced airway/laryngeal closure Pharyngeal Material enters airway, remains ABOVE vocal cords then ejected out Pharyngeal- Mechanical Soft -- Pharyngeal -- Pharyngeal- Regular -- Pharyngeal -- Pharyngeal- Multi-consistency -- Pharyngeal -- Pharyngeal- Pill -- Pharyngeal -- Pharyngeal Comment --  CHL IP CERVICAL ESOPHAGEAL PHASE 06/15/2019 Cervical Esophageal Phase WFL Pudding Teaspoon -- Pudding Cup -- Honey Teaspoon -- Honey Cup -- Nectar Teaspoon -- Nectar Cup -- Nectar Straw -- Thin Teaspoon -- Thin Cup -- Thin Straw --  Puree -- Mechanical Soft -- Regular -- Multi-consistency -- Pill -- Cervical Esophageal Comment -- Note populated for Lenore Manner, Student SLP Osie Bond., M.A. [facility] Pager [phone] Office [phone] 06/15/2019, 2:39 PM              ECHOCARDIOGRAM COMPLETE  Result Date: 06/14/2019   ECHOCARDIOGRAM REPORT   Patient Name:   Megan Martinez Date of Exam: 06/14/2019 Medical Rec #:  [id number]        Height:       64.0 in Accession #:    [id number]       Weight:       169.0 lb Date of Birth:  1950/06/10         BSA:          1.82 m Patient Age:    69 years         BP:           114/101 mmHg Patient Gender: F                HR:           77 bpm. Exam Location:  Inpatient Procedure: 2D Echo Indications:    stroke 434.91  History:        Patient has no prior history of Echocardiogram examinations.                 Risk Factors:Hypertension and Former Smoker.  Sonographer:    Jannett Celestine RDCS (AE) Referring Phys: [phone] MCNEILL P KIRKPATRICK  Sonographer Comments: restricted mobility IMPRESSIONS  1. Left ventricular ejection fraction, by visual estimation, is  60 to 65%. The left ventricle has normal function. There is no left ventricular hypertrophy.  2. Left ventricular diastolic parameters are consistent with Grade I diastolic dysfunction (impaired relaxation).  3. The left ventricle has no regional wall motion abnormalities.  4. Global right ventricle has normal systolic function.The right ventricular size is normal. No increase in right ventricular wall thickness.  5. Left atrial size was normal.  6. Right atrial size was normal.  7. Mild mitral annular calcification.  8. The mitral valve is normal in structure. No evidence of mitral valve regurgitation. No evidence of mitral stenosis.  9. The tricuspid valve is normal in structure. Tricuspid valve regurgitation is trivial. 10. The aortic valve is tricuspid. Aortic valve regurgitation is not visualized. No evidence of aortic valve  sclerosis or stenosis. 11. The inferior vena cava is normal in size with greater than 50% respiratory variability, suggesting right atrial pressure of 3 mmHg. 12. TR signal is inadequate for assessing pulmonary artery systolic pressure. FINDINGS  Left Ventricle: Left ventricular ejection fraction, by visual estimation, is 60 to 65%. The left ventricle has normal function. The left ventricle has no regional wall motion abnormalities. The left ventricular internal cavity size was the left ventricle is normal in size. There is no left ventricular hypertrophy. Left ventricular diastolic parameters are consistent with Grade I diastolic dysfunction (impaired relaxation). Right Ventricle: The right ventricular size is normal. No increase in right ventricular wall thickness. Global RV systolic function is has normal systolic function. Left Atrium: Left atrial size was normal in size. Right Atrium: Right atrial size was normal in size Pericardium: There is no evidence of pericardial effusion. Mitral Valve: The mitral valve is normal in structure. Mild mitral annular calcification. No evidence of mitral valve regurgitation. No evidence of mitral valve stenosis by observation. Tricuspid Valve: The tricuspid valve is normal in structure. Tricuspid valve regurgitation is trivial. Aortic Valve: The aortic valve is tricuspid. Aortic valve regurgitation is not visualized. The aortic valve is structurally normal, with no evidence of sclerosis or stenosis. Pulmonic Valve: The pulmonic valve was normal in structure. Pulmonic valve regurgitation is not visualized. Pulmonic regurgitation is not visualized. Aorta: The aortic root is normal in size and structure. Venous: The inferior vena cava is normal in size with greater than 50% respiratory variability, suggesting right atrial pressure of 3 mmHg. IAS/Shunts: No atrial level shunt detected by color flow Doppler.  LEFT VENTRICLE PLAX 2D LVIDd:         3.30 cm  Diastology LVIDs:          1.70 cm  LV e' lateral:   15.20 cm/s LV PW:         1.10 cm  LV E/e' lateral: 4.1 LV IVS:        1.00 cm  LV e' medial:    8.92 cm/s LVOT diam:     1.90 cm  LV E/e' medial:  6.9 LV SV:         36 ml LV SV Index:   18.98 LVOT Area:     2.84 cm  RIGHT VENTRICLE TAPSE (M-mode): 1.9 cm LEFT ATRIUM             Index LA diam:        3.20 cm 1.76 cm/m LA Vol (A2C):   43.7 ml 23.99 ml/m LA Vol (A4C):   41.2 ml 22.62 ml/m LA Biplane Vol: 42.4 ml 23.28 ml/m  AORTIC VALVE LVOT Vmax:   90.10 cm/s LVOT Vmean:  63.600  cm/s LVOT VTI:    0.261 m  AORTA Ao Root diam: 2.90 cm MITRAL VALVE MV Area (PHT): 2.16 cm             SHUNTS MV PHT:        101.79 msec          Systemic VTI:  0.26 m MV Decel Time: 351 msec             Systemic Diam: 1.90 cm MV E velocity: 61.70 cm/s 103 cm/s MV A velocity: 43.30 cm/s 70.3 cm/s MV E/A ratio:  1.42       1.5  Loralie Champagne MD Electronically signed by Loralie Champagne MD Signature Date/Time: 06/14/2019/5:12:56 PM    Final    CT HEAD CODE STROKE WO CONTRAST  Result Date: 06/14/2019 CLINICAL DATA:  Code stroke. Ataxia. Facial droop and slurred speech. Last seen normal at 9:30 p.m. last night. EXAM: CT HEAD WITHOUT CONTRAST TECHNIQUE: Contiguous axial images were obtained from the base of the skull through the vertex without intravenous contrast. COMPARISON:  None. FINDINGS: Brain: No acute infarct, hemorrhage, or mass lesion is present. The ventricles are of normal size. No significant white matter lesions are present. No significant extraaxial fluid collection is present. Vascular: Atherosclerotic calcifications are present within the cavernous internal carotid arteries bilaterally. There is no hyperdense vessel. Skull: Calvarium is intact. No focal lytic or blastic lesions are present. No significant extracranial soft tissue lesion is present. Sinuses/Orbits: The paranasal sinuses and mastoid air cells are clear. Bilateral lens replacements are noted. Globes and orbits are otherwise  unremarkable. ASPECTS [facility] Stroke Program Early CT Score) - Ganglionic level infarction (caudate, lentiform nuclei, internal capsule, insula, M1-M3 cortex): 7/7 - Supraganglionic infarction (M4-M6 cortex): 3/3 Total score (0-10 with 10 being normal): 10/10 IMPRESSION: 1. Negative CT of the head. 2. ASPECTS is 10/10 The above was relayed via text pager to Dr. Leonel Ramsay on 06/14/2019 at 07:32 . Electronically Signed   By: San Morelle M.D.   On: 06/14/2019 07:32       HISTORY OF PRESENT ILLNESS Megan Martinez is a 69 y.o. female with a history of hypertension who was last in her normal state of health until she went to bed around 9:30 PM on 1/21 (LKW).  When she awoke at "5:44 AM" she noticed that she was having problems with her right side.  She called her boyfriend who came over and decided to dial 911.   She was brought to the emergency department as a code stroke. CT/CTA/CTP revealed basilar stenosis but no intervention amenable occlusions.   Due to the density of her deficits, she was considered for treatment based on the WAKE-UP trial and taken for emergent MRI.  This did confirm a DWI-flair mismatch and therefore Dr. Leonel Ramsay discussed both the typical risks of IV TPA as well as the fact that he would be treating based on this study which was suggestive that people do better when they got the medication versus when they did not.  She agreed with proceeding with IV TPA. Premorbid modified rankin scale: 0   HOSPITAL COURSE [name] is a 69 y.o. female with history of HTN and thyroid disease presenting with R sided weakness and dysarthria. Received tPA 06/14/2019 at 0817.  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 ASPECTS 10.  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 Moderate B proximal PCA stenoses. L VA 50% stenosis w/  narrowing distal  V4  CT perfusion no infarct   MRI  L paramedian pontine infarct. High-grade mid BA stenosis.   CT at 24h low density L ponts. No hemorrhage   2D Echo EF 60-65%. No source of embolus    LDL 137  HgbA1c 5.8  lovenox for VTE prophylaxis  aspirin 81 mg daily prior to admission, now on aspirin 325 mg daily, clopidogrel 75 mg daily following aspirin and Plavix load. Continue DAPT x 3 months then plavix alone for intracranial stenosis.  Therapy recommendations: CIR  Disposition:  CIR  BA stenosis  Given the severity, this will need to be intervened  Dr. Estanislado Pandy plans for [facility] stent at the end of her rehab stay  Hypertension  Home meds:  norvasc 10, cadesartan-HCTZ 32-25   Stable   Now on no BP meds  Long-term BP goal 130-160 before BA intervention given BA stenosis   Hyperlipidemia   Not on statin PTA  LDL 137, goal < 70  On lipitor 80  No statin allergy as per pt  Continue statin on discharge  Dysphagia, resolved  Secondary to stroke  Cleared for diet by Speech   On diet and thin liquid   Other Stroke Risk Factors  Advanced age  Former Cigarette smoker, quit 51 yrs ago  ETOH use, alcohol level <10, advised to drink no more than 1 drink(s) a day  Other Active Problems  Hypokalemia resolved. K 3.0-3.1-3.3 -3.5->3.4 on daily potassium supplement - 3.5-3.8-3.7  Hypothyroid on synthroid  Bradycardia - TSH WNL  DISCHARGE EXAM Blood pressure (!) 135/55, pulse 61, temperature 98.2 F (36.8 C), temperature source Oral, resp. rate 18, height 5\' 4"  (1.626 m), weight 76.7 kg, SpO2 97 %. General - Well nourished, well developed, in no apparent distress.  Ophthalmologic - fundi not visualized due to noncooperation.  Cardiovascular - Regular rhythm and rate.  Mental Status -  Level of arousal and orientation to time, place, and person were intact. Language including expression, naming, repetition, comprehension was assessed and  found intact. Mild dysarthria Fund of Knowledge was assessed and was intact.  Cranial Nerves II - XII - II - Visual field intact OU. III, IV, VI - Extraocular movements intact. V - Facial sensation intact bilaterally. VII - right mild facial droop. VIII - Hearing & vestibular intact bilaterally. X - Palate elevates symmetrically. XI - Chin turning intact bilaterally. Right shoulder shrug weaker than left XII - Tongue protrusion intact.  Motor Strength - The patient's strength was normal in Left UE and LE, however, right UE 1+/5 and right LLE 3-/5 proximal and 3/5 knee extension, and 0/5 distally.   Motor Tone - Muscle tone was assessed at the neck and appendages and was normal.  Reflexes - The patient's reflexes were decreased on the right UE and LE and she had positive babinski on the right.  Sensory - Light touch, temperature/pinprick were assessed and were symmetrical.    Coordination - The patient had normal movements in the left hand and foot with no ataxia or dysmetria.  Tremor was absent.  Gait and Station - deferred.  Discharge Diet  Dysphagia 3 thin liquids  DISCHARGE PLAN  Disposition:  Transfer to [facility] for ongoing PT, OT and ST  aspirin 325 mg daily and clopidogrel 75 mg daily for secondary stroke prevention for 3 monthe then PLAVIX alone.  Dr. Estanislado Pandy  plans BA STENT at the end of CIR stay  Recommend ongoing stroke risk factor control by Primary Care Physician at time of discharge from inpatient rehabilitation.  Follow-up PCP [name], DO in 2 weeks following discharge from rehab.  Follow-up in [facility] in 4 weeks following discharge from rehab, office to schedule an appointment.   35 minutes were spent preparing discharge.  Rosalin Hawking, MD PhD Stroke Neurology 06/20/2019 4:06 PM

## 2019-06-18 NOTE — Progress Notes (Signed)
STROKE TEAM PROGRESS NOTE   INTERVAL HISTORY Pt sitting in chair, felt depressed at her right sided weakness. However, her RUE 1+/5, some improvement and RLE 3-/5 also slight improvement. Dr. Estanislado Martinez saw her and plan for BA stenting after CIR.    Vitals:   06/17/19 2355 06/18/19 0426 06/18/19 0850 06/18/19 1114  BP: (!) 141/71 (!) 142/68 139/65 135/72  Pulse: (!) 51 (!) 55 (!) 58 (!) 47  Resp: 18 18 18 18   Temp: 98.2 F (36.8 C) 98.1 F (36.7 C) 98.6 F (37 C) 98.5 F (36.9 C)  TempSrc: Oral Oral Oral Oral  SpO2: 97% 96% 96% 95%  Weight:      Height:        CBC:  Recent Labs  Lab 06/14/19 0719 06/14/19 0725 06/17/19 0509 06/18/19 0246  WBC 5.4   < > 6.7 7.2  NEUTROABS 3.2  --   --   --   HGB 14.6   < > 13.2 13.1  HCT 43.9   < > 39.1 38.6  MCV 86.6   < > 86.7 86.0  PLT 295   < > 264 251   < > = values in this interval not displayed.    Basic Metabolic Panel:  Recent Labs  Lab 06/17/19 0509 06/18/19 0246  NA 140 142  K 3.4* 3.5  CL 105 106  CO2 25 24  GLUCOSE 112* 103*  BUN 10 9  CREATININE 0.76 0.88  CALCIUM 9.1 9.0   Lipid Panel:     Component Value Date/Time   CHOL 204 (H) 06/15/2019 0644   TRIG 161 (H) 06/15/2019 0644   HDL 35 (L) 06/15/2019 0644   CHOLHDL 5.8 06/15/2019 0644   VLDL 32 06/15/2019 0644   LDLCALC 137 (H) 06/15/2019 0644   HgbA1c:  Lab Results  Component Value Date   HGBA1C 5.8 (H) 06/15/2019   Urine Drug Screen:     Component Value Date/Time   LABOPIA NONE DETECTED 06/14/2019 1252   COCAINSCRNUR NONE DETECTED 06/14/2019 1252   LABBENZ NONE DETECTED 06/14/2019 1252   AMPHETMU NONE DETECTED 06/14/2019 1252   THCU NONE DETECTED 06/14/2019 1252   LABBARB NONE DETECTED 06/14/2019 1252    Alcohol Level     Component Value Date/Time   ETH <10 06/14/2019 0719    IMAGING past 48 hours No results found.  PHYSICAL EXAM  Temp:  [97.6 F (36.4 C)-98.6 F (37 C)] 98.5 F (36.9 C) (01/25 1114) Pulse Rate:  [47-58] 47  (01/25 1114) Resp:  [18-20] 18 (01/25 1114) BP: (135-159)/(53-72) 135/72 (01/25 1114) SpO2:  [95 %-97 %] 95 % (01/25 1114)  General - Well nourished, well developed, in no apparent distress.  Ophthalmologic - fundi not visualized due to noncooperation.  Cardiovascular - Regular rhythm and rate.  Mental Status -  Level of arousal and orientation to time, place, and person were intact. Language including expression, naming, repetition, comprehension was assessed and found intact. Mild dysarthria Fund of Knowledge was assessed and was intact.  Cranial Nerves II - XII - II - Visual field intact OU. III, IV, VI - Extraocular movements intact. V - Facial sensation intact bilaterally. VII - right mild facial droop. VIII - Hearing & vestibular intact bilaterally. X - Palate elevates symmetrically. XI - Chin turning intact bilaterally. Right shoulder shrug weaker than left XII - Tongue protrusion intact.  Motor Strength - The patient's strength was normal in Left UE and LE, however, right UE 1+/5 and right LLE 3-/5 proximal and  3/5 knee extension, and 0/5 distally.   Motor Tone - Muscle tone was assessed at the neck and appendages and was normal.  Reflexes - The patient's reflexes were decreased on the right UE and LE and she had positive babinski on the right.  Sensory - Light touch, temperature/pinprick were assessed and were symmetrical.    Coordination - The patient had normal movements in the left hand and foot with no ataxia or dysmetria.  Tremor was absent.  Gait and Station - deferred.    ASSESSMENT/PLAN [name] is a 69 y.o. female with history of HTN and thyroid disease presenting with R sided weakness and dysarthria. Received tPA 06/14/2019 at 0817.  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 ASPECTS 10.    CTA head & neck  high-grade stenosis mid BA. Moderate B proximal PCA stenoses. L VA 50% stenosis w/ narrowing distal  V4  CT perfusion no infarct   MRI  L paramedian pontine infarct. High-grade mid BA stenosis.   CT at 24h low density L ponts. No hemorrhage   2D Echo EF 60-65%. No source of embolus    LDL 137  HgbA1c 5.8  lovenox for VTE prophylaxis  aspirin 81 mg daily prior to admission, now on aspirin 325 mg daily, clopidogrel 75 mg daily following aspirin and Plavix load. Continue DAPT x 3 months then plavix alone for intracranial stenosis.  Therapy recommendations: CIR  Disposition:  pending   Medically ready for transfer to rehab. Await insurance company approval.  BA stenosis  Given the severity, this will need to be intervened  Dr. Estanislado Martinez plans for [facility] stent at the end of her rehab stay  Pt ready for CIR  Hypertension  Home meds:  norvasc 10, cadesartan-HCTZ 32-25   Stable now 135-160  Now on no BP meds . Long-term BP goal 130-160 before BA intervention given BA stenosis   Hyperlipidemia   Not on statin PTA  LDL 137, goal < 70  On lipitor 80  No statin allergy as per pt  Continue statin on discharge  Dysphagia, resolved . Secondary to stroke . Cleared for diet by Speech  . On diet and thin liquid   Other Stroke Risk Factors  Advanced age  Former Cigarette smoker, quit 51 yrs ago  ETOH use, alcohol level <10, advised to drink no more than 1 drink(s) a day  Other Active Problems  Hypokalemia resolved. K 3.0-3.1-3.3 -3.5->3.4 on daily potassium supplement now - 3.5  Hypothyroid on synthroid  Bradycardia - [facility]  Hospital day # 4  Megan Hawking, MD PhD Stroke Neurology 06/18/2019 12:11 PM   To contact Stroke Continuity provider, please refer to [email or web address] After hours, contact General Neurology

## 2019-06-18 NOTE — Progress Notes (Signed)
NIR consulted by Dr. Erlinda Hong for management of mid basilar artery high-grade stenosis seen on CTA 06/14/2019. Patient has been admitted to [facility] since 06/14/2019 for management of acute CVA. She has disposition plans for CIR pending bed availability.  Went to evaluate patient bedside alongside Dr. Estanislado Pandy. Patient awake and alert sitting in chair watching TV. Demonstrates dysarthric speech. Complains of right-sided weakness, stable since admission.  Discussed findings of CTA with patient. Explained that with high-grade stenosis of her mid basilar artery, she is at high risk for future strokes. Explained that there are two management options moving forward- either continued conservative management including DAPT (Plavix and Aspirin) and routine imaging scans to monitor for changes, or with an endovascular revascularization procedure. Explained procedure, including risks and benefits. Patient expresses desire to move forward with procedure.  Plan for image-guided cerebral arteriogram with revascularization (angioplasty/stent placement) of mid basilar artery stenosis once patient has completed CIR. Patient to be discharged from [facility] and admitted to [facility] under NIR with stroke/neurology consulting. Patient to continue taking Plavix 75 mg once daily and Aspirin 81 mg once daily. Will place appropriate orders/obtain consent once patient has a tentative discharge date from CIR. Dr. Estanislado Pandy spoke with Dr. Erlinda Hong and Pamala Hurry (admissions coordinator for CIR) who are aware and agree with plan. All questions answered and concerns addressed. Patient conveys understanding and agrees with plan.  Please call NIR with questions/concerns.   Bea Graff Aimie Wagman, PA-C 06/18/2019, 1:37 PM

## 2019-06-18 NOTE — H&P (Addendum)
Physical Medicine and Rehabilitation Admission H&P     HPI: Megan Martinez is a 69 year old right-handed female with history of hypertension.  History taken from chart review and patient. 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  Presented 06/14/2019 with right-sided weakness and dysarthria.  Cranial CT unremarkable for acute intracranial process.  Patient did receive TPA.  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  Moderate proximal PCA stenosis bilaterally.  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  Echocardiogram with ejection fraction of 65% without emboli.  Admission chemistries with potassium 3.0, urine drug screen negative.  Neurology follow-up maintained on aspirin and Plavix for CVA prophylaxis x3 months then Plavix alone.  Subcutaneous Lovenox for DVT prophylaxis.  Tolerating mechanical soft diet.  Maintain on Cardene drip initially for blood pressure control.  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  Therapy evaluations completed and patient was admitted for a comprehensive rehab program.  Review of Systems  Constitutional: Negative for chills and fever.  HENT: Negative for hearing loss.   Eyes: Negative for blurred vision and double vision.  Respiratory: Negative for cough and shortness of breath.   Cardiovascular: Negative for chest pain, palpitations and leg swelling.  Gastrointestinal: Negative for constipation, heartburn, nausea and vomiting.  Genitourinary: Negative for dysuria, flank pain and hematuria.  Musculoskeletal: Positive for myalgias.  Skin: Negative for rash.    Neurological: Positive for speech change, focal weakness and weakness. Negative for sensory change.  All other systems reviewed and are negative.  Past Medical History:  Diagnosis Date  . Allergy   . Arthritis   . Hypertension   . Thyroid disease    Past Surgical History:  Procedure Laterality Date  . ABDOMINAL HYSTERECTOMY    . APPENDECTOMY    . CATARACT EXTRACTION    . CHOLECYSTECTOMY    . TUBAL LIGATION     History reviewed. No pertinent family history. Social History:  reports that she quit smoking about 51 years ago. She has never used smokeless tobacco. She reports current alcohol use. She reports that she does not use drugs. Only smoked x 1 year, age 58 because "was cool"; lives alone near [place] with Boyfriend living next door and daughter in [place] to come help daily- daughter trained as Marine scientist, but not working as one.    Allergies:  Allergies  Allergen Reactions  . Latex Itching and Rash  . Statins Other (See Comments)    Myalgias, "sick"   . Penicillin G     Did it involve swelling of the face/tongue/throat, SOB, or low BP? No Did it involve sudden or severe rash/hives, skin peeling, or any reaction on the inside of your mouth or nose? Yes Did you need to seek medical attention at a hospital or doctor's office? N/A When did it last happen?Child If all above answers are "NO", may proceed with cephalosporin use.  Marland Kitchen Amoxicillin Rash    Did it involve swelling of the face/tongue/throat, SOB, or low BP? No Did it involve sudden or severe rash/hives, skin peeling, or any reaction on the inside of your mouth or nose? Yes Did you need to seek medical attention  at a hospital or doctor's office? N/A When did it last happen? Child If all above answers are "NO", may proceed with cephalosporin use.  . Codeine Nausea Only   Medications Prior to Admission  Medication Sig Dispense Refill  . amLODipine (NORVASC) 10 MG tablet Take 1 tablet by mouth daily.    .  fluticasone (FLONASE) 50 MCG/ACT nasal spray Place 2 sprays into both nostrils daily. 16 g 6  . levothyroxine (SYNTHROID) 137 MCG tablet TAKE 1 TABLET BY MOUTH DAILY (Patient taking differently: Take 137 mcg by mouth daily. ) 30 tablet 3  . Candesartan Cilexetil-HCTZ 32-25 MG TABS TAKE 1 TABLET BY MOUTH DAILY (Patient not taking: Reported on 06/14/2019) 90 each 1  . HYDROcodone-homatropine (HYCODAN) 5-1.5 MG/5ML syrup Take 5 mLs by mouth every 8 (eight) hours as needed for cough. (Patient not taking: Reported on 06/14/2019) 120 mL 0  . levocetirizine (XYZAL) 5 MG tablet TAKE 1 TABLET(5 MG) BY MOUTH EVERY EVENING (Patient not taking: Reported on 06/14/2019) 90 tablet 1  . predniSONE (STERAPRED UNI-PAK 21 TAB) 10 MG (21) TBPK tablet Take as directed on packaging. (Patient not taking: Reported on 06/14/2019) 21 tablet 0  . triamcinolone cream (KENALOG) 0.1 % Apply 1 application topically 2 (two) times daily as needed. (Patient not taking: Reported on 01/03/2019) 450 g 0    Drug Regimen Review Drug regimen was reviewed and remains appropriate with no significant issues identified  Home: Home Living Family/patient expects to be discharged to:: Private residence Living Arrangements: Alone Available Help at Discharge: Family, Friend(s)(boyfriend, Richard and daughter, [name] will provide 24/7) Type of Home: House Home Access: Stairs to enter [employer] of Steps: 2 Home Layout: Two level, Laundry or work area in basement [employer] Shower/Tub: Careers adviser unit, Multimedia programmer: Associate Professor Accessibility: Yes Home Equipment: None Additional Comments: boyfriend of 11 years lives next door; [name] is Radiation protection practitioner and unemployed  Lives With: Alone   Functional History: Prior Function Level of Independence: Independent  Functional Status:  Mobility: Bed Mobility Overal bed mobility: Needs Assistance Bed Mobility: Supine to Sit, Rolling, Sidelying to Sit Rolling: Min  assist(to the R side reaching with L.) Sidelying to sit: Mod assist(assistance to elevate trunk into a seated position.) Supine to sit: Mod assist, HOB elevated Sit to supine: Min assist General bed mobility comments: Pt attempted to move OOB on L side but unable to manage due to flaccid R UE.  Pt performed better moving to L side of bed.  She still required moderate assistance to elevate trunk into a seated position. Transfers Overall transfer level: Needs assistance Equipment used: Rolling walker (2 wheeled) Transfers: Sit to/from Stand Sit to Stand: Mod assist Stand pivot transfers: Mod assist, +2 physical assistance General transfer comment: Mod assistance to boost into standing and assist RUE to RW with hand over hand placement.  She rises to standing with min but because she require assistance with RUE she remains moderate. Ambulation/Gait Ambulation/Gait assistance: Mod assist, Max assist Gait Distance (Feet): 60 Feet Assistive device: Rolling walker (2 wheeled) Gait Pattern/deviations: Decreased dorsiflexion - right, Decreased step length - left, Narrow base of support, Decreased stance time - right General Gait Details: Pt performed gt training with cues for sequencing and emphasis on R knee extension.  Noted buckling in R stance phase.  Performed 8 ft of gt training and sever buckling noted.  Pt sat down and PTA ace wrapped RLE into dorsiflexion.  Pt was able to progress steps better but still presents with  buckling when fatigue sets in. Hand over hand on RUE to maintain R hand and wrist position. Gait velocity: decreased    ADL: ADL Overall ADL's : Needs assistance/impaired Eating/Feeding: Minimal assistance Grooming: Minimal assistance, Sitting Upper Body Bathing: Moderate assistance Lower Body Bathing: Moderate assistance Upper Body Dressing : Minimal assistance Lower Body Dressing: Moderate assistance Lower Body Dressing Details (indicate cue type and reason): requires OT  to help thread socks and knee flexion for RLE to don sock. pt able to pull sock on with L UE Toilet Transfer: +2 for physical assistance, Moderate assistance General ADL Comments: OOB to chair this session with noted RLE bucklet required blocking  Cognition: Cognition Overall Cognitive Status: Within Functional Limits for tasks assessed Orientation Level: Oriented X4 Cognition Arousal/Alertness: Awake/alert Behavior During Therapy: WFL for tasks assessed/performed Overall Cognitive Status: Within Functional Limits for tasks assessed  Physical Exam: Blood pressure (!) 135/55, pulse 61, temperature 98.2 F (36.8 C), temperature source Oral, resp. rate 18, height 5\' 4"  (1.626 m), weight 76.7 kg, SpO2 97 %. Physical Exam  Nursing note and vitals reviewed. Constitutional: She appears well-developed and well-nourished.  HENT:  No facial sensation changes and no facial droop and tongue midline   Eyes: EOM are normal. Right eye exhibits no discharge. Left eye exhibits no discharge.  Neck: No tracheal deviation present. No thyromegaly present.  Cardiovascular:  RRR  Respiratory: Effort normal. No respiratory distress.  CTA B/L no W/R/R  GI: Soft. She exhibits no distension.  Soft, NT, ND, (+)BS  Musculoskeletal:     Cervical back: Normal range of motion and neck supple.     Comments: No edema or tenderness in extremities  Neurological: She is alert.  Dysarthria She makes eye contact with examiner and follows simple commands.   Appears to have fair insight and awareness. Motor: RUE: 0/5 proximal and distal RLE: Hip flexion, knee extension 4/5, ankle dorsiflexion 0/5 LUE: 5/5 proximal distal LLE: 5/5 proximal distal Sensation intact light touch  Skin: Skin is dry.  Psychiatric: She has a normal mood and affect. Her behavior is normal.    Results for orders placed or performed during the hospital encounter of 06/14/19 (from the past 48 hour(s))  Basic metabolic panel     Status:  Abnormal   Collection Time: 06/19/19  2:50 AM  Result Value Ref Range   Sodium 142 135 - 145 mmol/L   Potassium 3.8 3.5 - 5.1 mmol/L   Chloride 107 98 - 111 mmol/L   CO2 24 22 - 32 mmol/L   Glucose, Bld 100 (H) 70 - 99 mg/dL   BUN 13 8 - 23 mg/dL   Creatinine, Ser 0.88 0.44 - 1.00 mg/dL   Calcium 9.0 8.9 - 10.3 mg/dL   GFR calc non Af Amer >60 >60 mL/min   GFR calc Af Amer >60 >60 mL/min   Anion gap 11 5 - 15    Comment: Performed at [facility], [place] [address] [place] [address]  CBC     Status: None   Collection Time: 06/19/19  2:50 AM  Result Value Ref Range   WBC 7.0 4.0 - 10.5 K/uL   RBC 4.48 3.87 - 5.11 MIL/uL   Hemoglobin 13.3 12.0 - 15.0 g/dL   HCT 38.8 36.0 - 46.0 %   MCV 86.6 80.0 - 100.0 fL   MCH 29.7 26.0 - 34.0 pg   MCHC 34.3 30.0 - 36.0 g/dL   RDW 13.2 11.5 - 15.5 %   Platelets  237 150 - 400 K/uL   nRBC 0.0 0.0 - 0.2 %    Comment: Performed at [facility], [place] [address] [place], [place] [id number]  Basic metabolic panel     Status: None   Collection Time: 06/20/19  4:17 AM  Result Value Ref Range   Sodium 141 135 - 145 mmol/L   Potassium 3.7 3.5 - 5.1 mmol/L   Chloride 107 98 - 111 mmol/L   CO2 25 22 - 32 mmol/L   Glucose, Bld 99 70 - 99 mg/dL   BUN 15 8 - 23 mg/dL   Creatinine, Ser 0.93 0.44 - 1.00 mg/dL   Calcium 9.1 8.9 - 10.3 mg/dL   GFR calc non Af Amer >60 >60 mL/min   GFR calc Af Amer >60 >60 mL/min   Anion gap 9 5 - 15    Comment: Performed at [facility], [place] [address] [place] [address]  CBC     Status: None   Collection Time: 06/20/19  4:17 AM  Result Value Ref Range   WBC 6.0 4.0 - 10.5 K/uL   RBC 4.68 3.87 - 5.11 MIL/uL   Hemoglobin 13.6 12.0 - 15.0 g/dL   HCT 40.9 36.0 - 46.0 %   MCV 87.4 80.0 - 100.0 fL   MCH 29.1 26.0 - 34.0 pg   MCHC 33.3 30.0 - 36.0 g/dL   RDW 13.4 11.5 - 15.5 %   Platelets 252 150 - 400 K/uL   nRBC 0.0 0.0 - 0.2 %    Comment: Performed at [facility], [place]  [address], [place], [place] [address]   No results found.  Medical Problem List and Plan: 1.  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  PLAN BASILAR ARTERY STENTING AFTER REHAB PROGRAM completed per interventional radiology  -patient may  shower  -ELOS/Goals: 13-17 days/Goals Supervision/Min A  Admit to CIR 2.  Antithrombotics: -DVT/anticoagulation: Lovenox  -antiplatelet therapy: Aspirin 325 mg daily, Plavix 75 mg daily x3 months then Plavix alone 3. Pain Management: Tylenol as needed 4. Mood: Provide emotional support  -antipsychotic agents: N/A 5. Neuropsych: This patient is capable of making decisions on her own behalf. 6. Skin/Wound Care: Routine skin checks 7. Fluids/Electrolytes/Nutrition: Routine in and outs CMP ordered for tomorrow 8.  Essential hypertension: Patient on Norvasc 10 mg daily and cadestran-HCTZ 32-25 mg daily prior to admission.  Resume as needed  Monitor with increased mobility 9.  Hypothyroidism.  Continue Synthroid 10.  Hyperlipidemia: Continue Lipitor 11.  Postop dysphagia  D3 thins, advance diet as tolerated  Cathlyn Parsons, PA-C 06/20/2019   I have personally performed a face to face diagnostic evaluation, including, but not limited to relevant history and physical exam findings, of this patient and developed relevant assessment and plan.  Additionally, I have reviewed and concur with the physician assistant's documentation above.  Delice Lesch, MD, ABPMR

## 2019-06-18 NOTE — Progress Notes (Signed)
  Speech Language Pathology Treatment: Dysphagia;Cognitive-Linquistic  Patient Details Name: Megan Martinez MRN: [id number] DOB: [birth year] Today's Date: 06/18/2019 Time: [id number] SLP Time Calculation (min) (ACUTE ONLY): 23 min  Assessment / Plan / Recommendation Clinical Impression  Pt seen at bedside for skilled ST targeting dysarthria and dysphagia. Pt sitting upright in chair, recently finished breakfast meal. ST provided education re: recent MBS (06/15/19) and recommendations based off MBS (diet: dysphagia 3, thin liquids WITH chin tuck). Pt verbalized understanding and reports she has been using the chin tuck when she drinks liquids. Pt seen with both cup and straw sips of thin liquids, given fading verbal cues to utilize chin tuck. Pt demonstrated ability to do so. Pt with one instance of immediate throat clear. Pt noted to have (dry) coughing spell during session, did not appear to be due to PO intake.  Pt with speech intelligibility approx 85% at the sentence level. Pt presenting with difficulty coordinating speech/respirations (decreased respiratory timing) resulting in decreased intelligibility. Pt provided education re: taking a breath/pausing after each word. Pt also edu re: slowing rate, over-articulating and demonstrated ability to use these strategies. ST to follow acutely. Recommend patient continue dysphagia 3/thin liquids. Pt with use chin tuck with every sip of thin liquids.   HPI HPI:  69 y.o. female with a history of hypertension admitted with right sided hemiparesis and facial asymmetry; received IV TPA.  Dx acute left paramedian pontine CVA.       SLP Plan  Continue with current plan of care       Recommendations  Diet recommendations: Thin liquid;Dysphagia 3 (mechanical soft) Liquids provided via: Cup;Straw Medication Administration: Crushed with puree Supervision: Patient able to self feed;Intermittent supervision to cue for compensatory strategies Compensations:  Chin tuck;Use straw to facilitate chin tuck Postural Changes and/or Swallow Maneuvers: Seated upright 90 degrees;Upright 30-60 min after meal;Chin tuck                Oral Care Recommendations: Oral care BID Follow up Recommendations: Inpatient Rehab SLP Visit Diagnosis: Dysarthria and anarthria (R47.1);Dysphagia, oropharyngeal phase (R13.12) Plan: Continue with current plan of care       [place], M.Ed., [facility] Acute Rehabilitation [phone]: Acute Rehab office [phone] - pager   Marina Goodell 06/18/2019, 10:47 AM

## 2019-06-18 NOTE — Progress Notes (Signed)
Physical Therapy Treatment Patient Details Name: Megan Martinez MRN: [id number] DOB: 04/08/1951 Today's Date: 06/18/2019    History of Present Illness 69 yo admitted with acute right sided weakness upon waking up. Pt s/p tPA with acute pontine infarct. PMHx: HTN, arthritis    PT Comments    Pt performed gt training with increased activity tolerance.  Remains limited on R side and required hand over hand on R side. Close chair follow for safety.  Pt is very motivated to return home and back to work..  She remains an excellent candidate for aggressive rehab at [facility].    Follow Up Recommendations  CIR     Equipment Recommendations  Other (comment)(to be determined in next venue)    Recommendations for Other Services Rehab consult     Precautions / Restrictions Precautions Precautions: Fall Precaution Comments: right hemiparesis Restrictions Weight Bearing Restrictions: No    Mobility  Bed Mobility Overal bed mobility: Needs Assistance Bed Mobility: Sit to Supine       Sit to supine: Min assist   General bed mobility comments: assistance to lift B LEs back to bed.  Pt able to follow commands to boost to [facility].  Transfers Overall transfer level: Needs assistance Equipment used: Rolling walker (2 wheeled) Transfers: Sit to/from Stand Sit to Stand: Mod assist;+2 safety/equipment         General transfer comment: Hand over hand on R side.  Pt required moderate assistance to boost into standing.  Ambulation/Gait Ambulation/Gait assistance: Mod assist;+2 safety/equipment Gait Distance (Feet): 15 Feet Assistive device: Rolling walker (2 wheeled) Gait Pattern/deviations: Decreased dorsiflexion - right;Decreased step length - left;Narrow base of support;Decreased stance time - right     General Gait Details: Pt performed gt training with cues for sequencing and emphasis on R knee extension.  Noted buckling in R stance phase.   Stairs             Wheelchair  Mobility    Modified Rankin (Stroke Patients Only) Modified Rankin (Stroke Patients Only) Pre-Morbid Rankin Score: No symptoms Modified Rankin: Severe disability     Balance Overall balance assessment: Needs assistance   Sitting balance-Leahy Scale: Fair       Standing balance-Leahy Scale: Poor                              Cognition Arousal/Alertness: Awake/alert Behavior During Therapy: WFL for tasks assessed/performed Overall Cognitive Status: Within Functional Limits for tasks assessed                                        Exercises      General Comments        Pertinent Vitals/Pain Pain Assessment: Faces Faces Pain Scale: No hurt    Home Living                      Prior Function            PT Goals (current goals can now be found in the care plan section) Acute Rehab PT Goals Patient Stated Goal: return to my animals- dogs, cats, opossum Potential to Achieve Goals: Good Progress towards PT goals: Progressing toward goals    Frequency    Min 4X/week      PT Plan Current plan remains appropriate    Co-evaluation  AM-PAC PT "6 Clicks" Mobility   Outcome Measure  Help needed turning from your back to your side while in a flat bed without using bedrails?: A Lot Help needed moving from lying on your back to sitting on the side of a flat bed without using bedrails?: A Lot Help needed moving to and from a bed to a chair (including a wheelchair)?: A Lot Help needed standing up from a chair using your arms (e.g., wheelchair or bedside chair)?: A Lot Help needed to walk in hospital room?: A Lot Help needed climbing 3-5 steps with a railing? : Total 6 Click Score: 11    End of Session Equipment Utilized During Treatment: Gait belt Activity Tolerance: Patient tolerated treatment well Patient left: in chair;with call bell/phone within reach;with chair alarm set;with family/visitor present Nurse  Communication: Mobility status;Precautions PT Visit Diagnosis: Hemiplegia and hemiparesis;Other symptoms and signs involving the nervous system (R29.898) Hemiplegia - Right/Left: Right Hemiplegia - dominant/non-dominant: Dominant Hemiplegia - caused by: Cerebral infarction     [id number] PT Time Calculation (min) (ACUTE ONLY): 17 min  Charges:  $Gait Training: 8-22 mins                     Erasmo Leventhal , PTA Acute Rehabilitation Services Pager [phone] Office [phone]     Teagon Kron Eli Hose 06/18/2019, 6:12 PM

## 2019-06-19 DIAGNOSIS — I6322 Cerebral infarction due to unspecified occlusion or stenosis of basilar arteries: Secondary | ICD-10-CM | POA: Diagnosis present

## 2019-06-19 LAB — BASIC METABOLIC PANEL
Anion gap: 11 (ref 5–15)
BUN: 13 mg/dL (ref 8–23)
CO2: 24 mmol/L (ref 22–32)
Calcium: 9 mg/dL (ref 8.9–10.3)
Chloride: 107 mmol/L (ref 98–111)
Creatinine, Ser: 0.88 mg/dL (ref 0.44–1.00)
GFR calc Af Amer: >60 mL/min (ref >60)
GFR calc non Af Amer: >60 mL/min (ref >60)
Glucose, Bld: 100 mg/dL — ABNORMAL HIGH (ref 70–99)
Potassium: 3.8 mmol/L (ref 3.5–5.1)
Sodium: 142 mmol/L (ref 135–145)

## 2019-06-19 LAB — CBC
HCT: 38.8 % (ref 36.0–46.0)
Hemoglobin: 13.3 g/dL (ref 12.0–15.0)
MCH: 29.7 pg (ref 26.0–34.0)
MCHC: 34.3 g/dL (ref 30.0–36.0)
MCV: 86.6 fL (ref 80.0–100.0)
Platelets: 237 10*3/uL (ref 150–400)
RBC: 4.48 MIL/uL (ref 3.87–5.11)
RDW: 13.2 % (ref 11.5–15.5)
WBC: 7 10*3/uL (ref 4.0–10.5)
nRBC: 0 % (ref 0.0–0.2)

## 2019-06-19 MED ORDER — ATORVASTATIN CALCIUM 80 MG PO TABS: 80.0000 mg | ORAL_TABLET | Freq: Every day | ORAL | Status: DC

## 2019-06-19 MED ORDER — LEVOTHYROXINE SODIUM 137 MCG PO TABS: 137.0000 ug | ORAL_TABLET | Freq: Every day | ORAL | Status: DC

## 2019-06-19 MED ORDER — CHLORHEXIDINE GLUCONATE CLOTH 2 % EX PADS: 6.0000 | MEDICATED_PAD | Freq: Every day | CUTANEOUS | Status: DC

## 2019-06-19 MED ORDER — POTASSIUM CHLORIDE CRYS ER 20 MEQ PO TBCR: 20.0000 meq | EXTENDED_RELEASE_TABLET | Freq: Every day | ORAL | Status: DC

## 2019-06-19 MED ORDER — ASPIRIN 325 MG PO TBEC: 325.0000 mg | DELAYED_RELEASE_TABLET | Freq: Every day | ORAL | 0 refills | Status: DC

## 2019-06-19 MED ORDER — CLOPIDOGREL BISULFATE 75 MG PO TABS: 75.0000 mg | ORAL_TABLET | Freq: Every day | ORAL | Status: DC

## 2019-06-19 MED ORDER — ENOXAPARIN SODIUM 40 MG/0.4ML ~~LOC~~ SOLN: 40.0000 mg | SUBCUTANEOUS | Status: DC

## 2019-06-19 NOTE — Progress Notes (Signed)
{ Cristina Gong, RN  Rehab Admission Coordinator  Physical Medicine and Rehabilitation  PMR Pre-admission  Signed  Date of Service:  06/19/2019 10:18 AM      Related encounter: ED to Hosp-Admission (Current) from 06/14/2019 in [facility]      Signed        Show:Clear all [x] Manual[x] Template[x] Copied  Added by: [x] Cristina Gong, RN  [] Hover for details PMR Admission Coordinator Pre-Admission Assessment   Patient: Megan Martinez is an 69 y.o., female MRN: [id number] DOB: 1950/09/04 Height: 5\' 4"  (162.6 cm) Weight: 76.7 kg                                                                                                                                                  Insurance Information HMO:     PPO: yes     PCP:      IPA:      80/20:      OTHER:  PRIMARY: [employer]      Policy#: [id number]      Subscriber: pt CM Name: Colletta [place]      Phone#: [phone]     Fax#: Epic access Pre-Cert#: [id number] approved for 7 days      Employer:  Benefits:  Phone #: [phone]     Name: 1/26 Eff. Date: 05/25/2019     Deduct: none      Out of Pocket Max: $3400      Life Max: none CIR: $295 co pay per day days 1 until 6      SNF: no co pay days 1 until 20; $178 co pay per day days 21 until 100 Outpatient: $15 co py per visit     Co-Pay: visits per medical neccesity Home Health: 100%      Co-Pay: visits per medical neccesity DME: 80%     Co-Pay: 20% Providers: in network  SECONDARY: none         Medicaid Application Date:       Case Manager:  Disability Application Date:       Case Worker:    The "Data Collection Information Summary" for patients in Inpatient Rehabilitation Facilities with attached "Privacy Act [place] Records" was provided and verbally reviewed with: Patient   Emergency Contact Information         Contact Information     Name Relation Home Work Mobile    Wyer,Richard Significant other [phone]             Current Medical History  Patient Admitting Diagnosis:  CVA   History of Present Illness: 69 year old right-handed female with history of hypertension.   Presented 06/14/2019 with right-sided weakness and speech difficulty.  Cranial CT scan negative.  Patient did receive TPA.  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  Moderate proximal  PCA stenosis bilaterally.  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  Echocardiogram with ejection fraction 65% without emboli.  Admission chemistries with potassium 3.0, urine drug screen negative.  Neurology follow-up maintained on aspirin and Plavix for CVA prophylaxis x3 months then Plavix alone.  Subcutaneous Lovenox for DVT prophylaxis.  Tolerating mechanical soft diet.  Maintain on Cardene drip initially for blood pressure control.  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     Complete NIHSS TOTAL: 10 Glasgow Coma Scale Score: 15   Past Medical History      Past Medical History:  Diagnosis Date  . Allergy    . Arthritis    . Hypertension    . Thyroid disease        Family History  family history is not on file.   Prior Rehab/Hospitalizations:  Has the patient had prior rehab or hospitalizations prior to admission? Yes   Has the patient had major surgery during 100 days prior to admission? No   Current Medications    Current Facility-Administered Medications:  .  acetaminophen (TYLENOL) tablet 650 mg, 650 mg, Oral, Q4H PRN, 650 mg at 06/14/19 2158 **OR** acetaminophen (TYLENOL) 160 MG/5ML solution 650 mg, 650 mg, Per Tube, Q4H PRN **OR** acetaminophen (TYLENOL) suppository 650 mg, 650 mg, Rectal, Q4H PRN, Donzetta Starch, NP .  [COMPLETED] aspirin EC tablet 325 mg, 325 mg, Oral, Once, 325 mg at 06/15/19 1049 **FOLLOWED BY** aspirin EC tablet 325 mg, 325 mg, Oral, Daily, Rosalin Hawking, MD, 325 mg at 06/19/19 0905 .  atorvastatin (LIPITOR) tablet 80 mg, 80 mg, Oral, q1800, Rosalin Hawking, MD, 80 mg at 06/18/19 1756 .  Chlorhexidine Gluconate Cloth 2 % PADS 6 each, 6 each, Topical, Daily, Biby, Sharon L, NP, 6 each at 06/17/19 1058 .  clopidogrel (PLAVIX) tablet 300 mg, 300 mg, Oral, Daily, 300 mg at 06/15/19 1049 **OR** clopidogrel (PLAVIX) tablet 75 mg, 75 mg, Oral, Daily, Biby, Sharon L, NP, 75 mg at 06/19/19 0905 .  enoxaparin (LOVENOX) injection 40 mg, 40 mg, Subcutaneous, Q24H, Rosalin Hawking, MD, 40 mg at 06/18/19 1951 .  hydrALAZINE (APRESOLINE) injection 5 mg, 5 mg, Intravenous, Q4H PRN, Rinehuls, David L, PA-C .  levothyroxine (SYNTHROID) tablet 137 mcg, 137 mcg, Oral, Q0600, Donzetta Starch, NP, 137 mcg at 06/19/19 0617 .  pantoprazole (PROTONIX) EC tablet 40 mg, 40 mg, Oral, QHS, Biby, Sharon L, NP, 40 mg at 06/18/19 2149 .  potassium chloride SA (KLOR-CON) CR tablet 20 mEq, 20 mEq, Oral, Daily, Rinehuls, David L, PA-C, 20 mEq at 06/19/19 [id number]   Patients Current Diet:     Diet Order                      DIET DYS 3 Room service appropriate? Yes; Fluid consistency: Thin  Diet effective now                   Precautions / Restrictions Precautions Precautions: Fall Precaution Comments: right hemiparesis Restrictions Weight Bearing Restrictions: No    Has the patient had 2 or more falls or a fall with injury in the past year?No   Prior Activity Level Community (5-7x/wk): Independent and driving pta; retired   Prior Functional Level  Prior Function Level of Independence: Independent   Self Care: Did the patient need help bathing, dressing, using the toilet or eating?  Independent   Indoor Mobility: Did the patient need assistance with walking from room to room (with or without device)?  Independent   Stairs: Did the patient need assistance with internal or external stairs (with or without device)? Independent   Functional Cognition: Did the patient need help planning regular tasks such as shopping or remembering to take medications? Independent   Home Assistive Devices / Equipment Home Assistive Devices/Equipment: None Home Equipment: None   Prior Device Use: Indicate devices/aids used by the patient prior to current illness, exacerbation or injury? None of the above   Current Functional Level Cognition   Overall Cognitive Status: Within Functional Limits for tasks assessed Orientation Level: Oriented X4    Extremity Assessment (includes Sensation/Coordination)   Upper Extremity Assessment: RUE deficits/detail RUE Deficits / Details: scapula activation noted and extension with tone with blood pressure cuff. pt reports noraml sensation RUE Coordination: decreased gross motor, decreased fine motor  Lower Extremity Assessment: Defer to PT evaluation RLE Deficits / Details: hip flexion 1/5, knee flexion 2-/5, knee extenstion 2-/5, dorsiflexion 1/5     ADLs   Overall ADL's : Needs assistance/impaired Eating/Feeding: Minimal assistance Grooming: Minimal assistance, Sitting Upper Body Bathing: Moderate assistance Lower Body Bathing: Moderate assistance Upper Body Dressing : Minimal assistance Lower Body Dressing: Moderate assistance Lower Body Dressing Details (indicate cue type and reason): requires OT to help thread socks and knee flexion for RLE to don sock. pt able to pull sock on with L UE Toilet Transfer: +2 for physical assistance, Moderate assistance General ADL Comments: OOB to chair this session with noted RLE bucklet required blocking     Mobility   Overal bed mobility: Needs Assistance Bed Mobility: Sit to Supine Supine to sit: Mod assist, HOB elevated Sit to supine: Min assist General bed mobility comments: assistance to lift B LEs back to bed.  Pt  able to follow commands to boost to [facility].     Transfers   Overall transfer level: Needs assistance Equipment used: Rolling walker (2 wheeled) Transfers: Sit to/from Stand Sit to Stand: Mod assist, +2 safety/equipment Stand pivot transfers: Mod assist, +2 physical assistance General transfer comment: Hand over hand on R side.  Pt required moderate assistance to boost into standing.     Ambulation / Gait / Stairs / Wheelchair Mobility   Ambulation/Gait Ambulation/Gait assistance: Mod assist, +2 safety/equipment Gait Distance (Feet): 15 Feet Assistive device: Rolling walker (2 wheeled) Gait Pattern/deviations: Decreased dorsiflexion - right, Decreased step length - left, Narrow base of support, Decreased stance time - right General Gait Details: Pt performed gt training with cues for sequencing and emphasis on R knee extension.  Noted buckling in R stance phase. Gait velocity: decreased     Posture / Balance Dynamic Sitting Balance Sitting balance - Comments: pt able to sit EOB with guarding Balance Overall balance assessment: Needs assistance Sitting-balance support: Single extremity supported, No upper extremity supported, Feet supported Sitting balance-Leahy Scale: Fair Sitting balance - Comments: pt able to sit EOB with guarding Standing balance support: Bilateral upper extremity supported Standing balance-Leahy Scale: Poor Standing balance comment: bil UE support with RLE supported     Special needs/care consideration BiPAP/CPAP CPM Continuous Drip IV Dialysis         Life Vest Oxygen Special Bed Trach Size Wound Vac  Skin       Bowel mgmt: LBM 1/26  continent Bladder mgmt: continent Diabetic mgmt Hgb A1c 5.8 Behavioral consideration  Chemo/radiation  Designated visitor is boyfriend, Richard BAS stent at d/c from [facility], admit to ICU and d/c home from ICU    Previous Home Environment  Living Arrangements: Alone  Lives With: Alone Available Help at Discharge: Family,  Friend(s)(boyfriend, Richard and daughter, [name] will provide 24/7) Type of Home: House Home Layout: Two level, Laundry or work area in [place] Access: Stairs to enter Technical brewer of Steps: 2 Bathroom Shower/Tub: Public librarian, Multimedia programmer: Programmer, systems: Yes How Accessible: Accessible via walker [place]: No Additional Comments: boyfriend of 11 years lives next door; [name] is Radiation protection practitioner and unemployed   Discharge Living Setting Plans for Discharge Living Setting: Patient's home, Alone Type of Home at Discharge: House Discharge Home Layout: Laundry or work area in basement, Two level Discharge Home Access: Stairs to enter Entrance Stairs-Rails: None Technical brewer of Steps: 2 Discharge Bathroom Shower/Tub: Tub/shower unit, Horticulturist, commercial: Standard Discharge Bathroom Accessibility: Yes How Accessible: Accessible via walker Does the patient have any problems obtaining your medications?: No   Social/Family/Support Systems Patient Roles: Partner, Parent Contact Information: Richard, boyfriend Anticipated Caregiver: [name] and [employer] Anticipated Ambulance person Information: [phone], Richard Ability/Limitations of Caregiver: none Caregiver Availability: 24/7 Discharge Plan Discussed with Primary Caregiver: Yes Is Caregiver In Agreement with Plan?: Yes Does Caregiver/Family have Issues with Lodging/Transportation while Pt is in Rehab?: No   Goals/Additional Needs Patient/Family Goal for Rehab: supervision PT, supervision to min OT, supervision SLP Expected length of stay: elos 10 TO 14 DAYS Special Service Needs: Will have BAS stenting after CIR by Dr. Estanislado Pandy. We are to alert IR of planned d/c date. Pt will d/c to acute for stent then recovery at ICU overnight and then d/c home from acute Pt/Family Agrees to Admission and willing to participate: Yes Program Orientation  Provided & Reviewed with Pt/Caregiver Including Roles  & Responsibilities: Yes   Decrease burden of Care through IP rehab admission:    Possible need for SNF placement upon discharge:   Patient Condition: This patient's condition remains as documented in the consult dated 06/18/2019, in which the Rehabilitation Physician determined and documented that the patient's condition is appropriate for intensive rehabilitative care in an inpatient rehabilitation facility. Will admit to inpatient rehab today.   Preadmission Screen Completed By:  Cleatrice Burke, RN, 06/19/2019 10:18 AM ______________________________________________________________________   Discussed status with Dr. Dagoberto Ligas on 06/19/2019 at  82 and received approval for admission today.   Admission Coordinator:  Cleatrice Burke, time [id number] Date 06/19/2019         Cosigned by: Courtney Heys, MD at 06/19/2019 10:36 AM  Revision History

## 2019-06-19 NOTE — TOC Transition Note (Signed)
Transition of Care [facility]) - CM/SW Discharge Note   Patient Details  Name: ELIESE SHOJI MRN: [id number] Date of Birth: Oct 15, 1950  [facility] CM/SW Contact:  Pollie Friar, RN Phone Number: 06/19/2019, 1:31 PM   Clinical Narrative:    Pt discharging to CIR today. CM signing off.    Final next level of care: IP Rehab Facility Barriers to Discharge: No Barriers Identified   Patient Goals and CMS Choice   CMS Medicare.gov Compare Post Acute Care list provided to:: Patient Choice offered to / list presented to : Patient  Discharge Placement                       Discharge Plan and Services                                     Social Determinants of Health (SDOH) Interventions     Readmission Risk Interventions No flowsheet data found.

## 2019-06-19 NOTE — Progress Notes (Signed)
Physical Therapy Treatment Patient Details Name: Megan Martinez MRN: [id number] DOB: [birth year] Today's Date: 06/19/2019    History of Present Illness 70 yo admitted with acute right sided weakness upon waking up. Pt s/p tPA with acute pontine infarct. PMHx: HTN, arthritis    PT Comments    Pt performed gt training and functional mobility with continued moderate assistance.  Pt did require max assistance x1 due to severe R knee buckling.  PLaced patient in ace wrap on RLE into dorsiflexion and stance improved.  Continue to recommend aggressive therapies at CIR before returning home.  Pt remains very motivated to improve.     Follow Up Recommendations  CIR     Equipment Recommendations  Other (comment)(To be determined in next venue)    Recommendations for Other Services Rehab consult     Precautions / Restrictions Precautions Precautions: Fall Precaution Comments: right hemiparesis Restrictions Weight Bearing Restrictions: No Other Position/Activity Restrictions: RLE ace wrapped into dorsiflexion.    Mobility  Bed Mobility Overal bed mobility: Needs Assistance Bed Mobility: Supine to Sit;Rolling;Sidelying to Sit Rolling: Min assist(to the R side reaching with L.) Sidelying to sit: Mod assist(assistance to elevate trunk into a seated position.)       General bed mobility comments: Pt attempted to move OOB on L side but unable to manage due to flaccid R UE.  Pt performed better moving to L side of bed.  She still required moderate assistance to elevate trunk into a seated position.  Transfers Overall transfer level: Needs assistance Equipment used: Rolling walker (2 wheeled) Transfers: Sit to/from Stand Sit to Stand: Mod assist         General transfer comment: Mod assistance to boost into standing and assist RUE to RW with hand over hand placement.  She rises to standing with min but because she require assistance with RUE she remains  moderate.  Ambulation/Gait Ambulation/Gait assistance: Mod assist;Max assist Gait Distance (Feet): 60 Feet Assistive device: Rolling walker (2 wheeled) Gait Pattern/deviations: Decreased dorsiflexion - right;Decreased step length - left;Narrow base of support;Decreased stance time - right Gait velocity: decreased   General Gait Details: Pt performed gt training with cues for sequencing and emphasis on R knee extension.  Noted buckling in R stance phase.  Performed 8 ft of gt training and sever buckling noted.  Pt sat down and PTA ace wrapped RLE into dorsiflexion.  Pt was able to progress steps better but still presents with buckling when fatigue sets in. Hand over hand on RUE to maintain R hand and wrist position.   Stairs             Wheelchair Mobility    Modified Rankin (Stroke Patients Only) Modified Rankin (Stroke Patients Only) Pre-Morbid Rankin Score: No symptoms Modified Rankin: Severe disability     Balance Overall balance assessment: Needs assistance Sitting-balance support: Single extremity supported;No upper extremity supported;Feet supported Sitting balance-Leahy Scale: Fair Sitting balance - Comments: pt able to sit EOB with guarding   Standing balance support: Bilateral upper extremity supported Standing balance-Leahy Scale: Poor Standing balance comment: External assistance to maintain standing.               High Level Balance Comments: Performing table tasks with BUE support washing table.  Reaching outside BOS.            Cognition Arousal/Alertness: Awake/alert Behavior During Therapy: WFL for tasks assessed/performed Overall Cognitive Status: Within Functional Limits for tasks assessed  Exercises      General Comments        Pertinent Vitals/Pain Pain Assessment: Faces Faces Pain Scale: No hurt    Home Living     Available Help at Discharge: Family;Friend(s)(boyfriend,  Richard and daughter, [name] will provide 24/7)           Additional Comments: boyfriend of 11 years lives next door; [name] is Radiation protection practitioner and unemployed    Prior Function            PT Goals (current goals can now be found in the care plan section) Acute Rehab PT Goals Patient Stated Goal: return to my animals- dogs, cats, opossum Potential to Achieve Goals: Good Progress towards PT goals: Progressing toward goals    Frequency    Min 4X/week      PT Plan Current plan remains appropriate    Co-evaluation              AM-PAC PT "6 Clicks" Mobility   Outcome Measure  Help needed turning from your back to your side while in a flat bed without using bedrails?: A Lot Help needed moving from lying on your back to sitting on the side of a flat bed without using bedrails?: A Lot Help needed moving to and from a bed to a chair (including a wheelchair)?: A Lot Help needed standing up from a chair using your arms (e.g., wheelchair or bedside chair)?: A Lot Help needed to walk in hospital room?: A Lot Help needed climbing 3-5 steps with a railing? : Total 6 Click Score: 11    End of Session Equipment Utilized During Treatment: Gait belt Activity Tolerance: Patient tolerated treatment well Patient left: in chair;with call bell/phone within reach;with chair alarm set Nurse Communication: Mobility status;Precautions PT Visit Diagnosis: Hemiplegia and hemiparesis;Other symptoms and signs involving the nervous system (R29.898) Hemiplegia - Right/Left: Right Hemiplegia - dominant/non-dominant: Dominant     Time: [id number] PT Time Calculation (min) (ACUTE ONLY): 26 min  Charges:  $Gait Training: 8-22 mins $Therapeutic Activity: 8-22 mins                     Erasmo Leventhal , PTA Acute Rehabilitation Services Pager [phone] Office [phone]     Constance Whittle Eli Hose 06/19/2019, 12:00 PM

## 2019-06-19 NOTE — Progress Notes (Signed)
Kirsteins, Luanna Salk, MD  Physician  Physical Medicine and Rehabilitation  Consult Note  Signed  Date of Service:  06/18/2019  5:28 AM      Related encounter: ED to Hosp-Admission (Current) from 06/14/2019 in [place] [facility]      Signed      Expand AllCollapse All   Show:Clear all [x] Manual[x] Template[] Copied  Added by: [x] Angiulli, Lavon Paganini, PA-C[x] Kirsteins, Luanna Salk, MD  [] Hover for details          Physical Medicine and Rehabilitation Consult Reason for Consult: Right side weakness and expressive deficits Referring Physician: Dr. Leonie Man     HPI: MERRILLYN LITWILLER is a 69 y.o. right-handed female with history of hypertension..  Per chart review patient independent prior to admission works as a Radio broadcast assistant as well as works part-time at [employer] in [place].  Presented 06/14/2019 with right side weakness and speech difficulty.  Cranial CT scan negative.  Patient did receive TPA.  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  Moderate proximal PCA stenosis bilaterally.  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  Echocardiogram with ejection fraction 65% without emboli.  Admission chemistries with potassium 3.0, urine drug screen negative.  Neurology follow-up maintained on aspirin and Plavix for CVA prophylaxis.  Subcutaneous Lovenox for DVT prophylaxis.  Tolerating mechanical soft diet.  Maintain on Cardene drip for blood pressure control.  Therapy evaluation completed with recommendations of physical medicine rehab consult.   Patient states that she is unable to move her right arm at all.  She does have good feeling.  She denies having problems with finding the right word but has some difficulty with pronunciation.  She indicates she had a good session with speech  therapy this morning.   Review of Systems  Constitutional: Negative for chills and fever.  HENT: Negative for hearing loss.   Eyes: Negative for blurred vision and double vision.  Respiratory: Negative for cough and shortness of breath.   Cardiovascular: Negative for chest pain, palpitations and leg swelling.  Gastrointestinal: Positive for constipation. Negative for heartburn and vomiting.  Genitourinary: Negative for dysuria and flank pain.  Musculoskeletal: Positive for myalgias.  Skin: Negative for rash.  Neurological: Positive for speech change and weakness.  All other systems reviewed and are negative.       Past Medical History:  Diagnosis Date  . Allergy    . Arthritis    . Hypertension    . Thyroid disease           Past Surgical History:  Procedure Laterality Date  . ABDOMINAL HYSTERECTOMY      . APPENDECTOMY      . CATARACT EXTRACTION      . CHOLECYSTECTOMY      . TUBAL LIGATION        History reviewed. No pertinent family history. Social History:  reports that she quit smoking about 51 years ago. She has never used smokeless tobacco. She reports current alcohol use. She reports that she does not use drugs. Allergies:  Allergies  Allergen Reactions  . Latex Itching and Rash  . Statins Other (See Comments)      Myalgias, "sick"    . Penicillin G        Did it involve swelling of the face/tongue/throat, SOB, or low BP? No Did it involve sudden  or severe rash/hives, skin peeling, or any reaction on the inside of your mouth or nose? Yes Did you need to seek medical attention at a hospital or doctor's office? N/A When did it last happen? Child If all above answers are "NO", may proceed with cephalosporin use.  Marland Kitchen Amoxicillin Rash      Did it involve swelling of the face/tongue/throat, SOB, or low BP? No Did it involve sudden or severe rash/hives, skin peeling, or any reaction on the inside of your mouth or nose? Yes Did you need to seek medical attention at a  hospital or doctor's office? N/A When did it last happen? Child     If all above answers are "NO", may proceed with cephalosporin use.  . Codeine Nausea Only          Medications Prior to Admission  Medication Sig Dispense Refill  . amLODipine (NORVASC) 10 MG tablet Take 1 tablet by mouth daily.      . fluticasone (FLONASE) 50 MCG/ACT nasal spray Place 2 sprays into both nostrils daily. 16 g 6  . levothyroxine (SYNTHROID) 137 MCG tablet TAKE 1 TABLET BY MOUTH DAILY (Patient taking differently: Take 137 mcg by mouth daily. ) 30 tablet 3  . Candesartan Cilexetil-HCTZ 32-25 MG TABS TAKE 1 TABLET BY MOUTH DAILY (Patient not taking: Reported on 06/14/2019) 90 each 1  . HYDROcodone-homatropine (HYCODAN) 5-1.5 MG/5ML syrup Take 5 mLs by mouth every 8 (eight) hours as needed for cough. (Patient not taking: Reported on 06/14/2019) 120 mL 0  . levocetirizine (XYZAL) 5 MG tablet TAKE 1 TABLET(5 MG) BY MOUTH EVERY EVENING (Patient not taking: Reported on 06/14/2019) 90 tablet 1  . predniSONE (STERAPRED UNI-PAK 21 TAB) 10 MG (21) TBPK tablet Take as directed on packaging. (Patient not taking: Reported on 06/14/2019) 21 tablet 0  . triamcinolone cream (KENALOG) 0.1 % Apply 1 application topically 2 (two) times daily as needed. (Patient not taking: Reported on 01/03/2019) 450 g 0      Home: Home Living Family/patient expects to be discharged to:: Private residence Living Arrangements: Alone Available Help at Discharge: Friend(s), Available 24 hours/day Type of Home: House Home Access: Stairs to enter Technical brewer of Steps: 2 Home Layout: Two level, Laundry or work area in basement [employer] Shower/Tub: Public librarian, Multimedia programmer: Standard Home Equipment: None  Lives With: Alone  Functional History: Prior Function Level of Independence: Independent Functional Status:  Mobility: Bed Mobility Overal bed mobility: Needs Assistance Bed Mobility: Supine to Sit Supine to  sit: Mod assist, HOB elevated General bed mobility comments: mod assist to fully elevate trunk and pivot toward left side of bed Transfers Overall transfer level: Needs assistance Transfers: Sit to/from Stand, Stand Pivot Transfers Sit to Stand: Mod assist, +2 physical assistance Stand pivot transfers: Mod assist, +2 physical assistance General transfer comment: cues for sequence with RUE supported and Rt knee blocked to stand and pivot with sequential small steps to chair. Physical assist for weight shifting and moving RLE in standing Ambulation/Gait General Gait Details: unable   ADL:   Cognition: Cognition Overall Cognitive Status: Within Functional Limits for tasks assessed Orientation Level: Oriented X4 Cognition Arousal/Alertness: Awake/alert Behavior During Therapy: WFL for tasks assessed/performed Overall Cognitive Status: Within Functional Limits for tasks assessed   Blood pressure (!) 153/73, pulse (!) 57, temperature 98 F (36.7 C), temperature source Oral, resp. rate 20, height 5\' 4"  (1.626 m), weight 76.7 kg, SpO2 94 %. Physical Exam  Nursing note and vitals reviewed. Constitutional:  She is oriented to person, place, and time. She appears well-developed and well-nourished.  HENT:  Head: Normocephalic and atraumatic.  Eyes: Pupils are equal, round, and reactive to light. Conjunctivae and EOM are normal.  Cardiovascular: Normal rate, regular rhythm and normal heart sounds.  No murmur heard. Respiratory: Effort normal and breath sounds normal. No respiratory distress. She has no wheezes. She has no rales.  GI: Soft. Bowel sounds are normal. She exhibits no distension. There is no abdominal tenderness.  Neurological: She is alert and oriented to person, place, and time.  Patient with significant dysarthria.  Follows simple commands.  She was able to provide her name and age.  Fair insight and awareness of her deficits.  Skin: Skin is warm and dry.  Psychiatric: She has a  normal mood and affect.      Lab Results Last 24 Hours       Results for orders placed or performed during the hospital encounter of 06/14/19 (from the past 24 hour(s))  CBC     Status: None    Collection Time: 06/15/19  6:44 AM  Result Value Ref Range    WBC 6.2 4.0 - 10.5 K/uL    RBC 4.62 3.87 - 5.11 MIL/uL    Hemoglobin 13.6 12.0 - 15.0 g/dL    HCT 39.7 36.0 - 46.0 %    MCV 85.9 80.0 - 100.0 fL    MCH 29.4 26.0 - 34.0 pg    MCHC 34.3 30.0 - 36.0 g/dL    RDW 13.3 11.5 - 15.5 %    Platelets 261 150 - 400 K/uL    nRBC 0.0 0.0 - 0.2 %  Hemoglobin A1c     Status: Abnormal    Collection Time: 06/15/19  6:44 AM  Result Value Ref Range    Hgb A1c MFr Bld 5.8 (H) 4.8 - 5.6 %    Mean Plasma Glucose 119.76 mg/dL  Basic metabolic panel     Status: Abnormal    Collection Time: 06/15/19  6:44 AM  Result Value Ref Range    Sodium 139 135 - 145 mmol/L    Potassium 3.3 (L) 3.5 - 5.1 mmol/L    Chloride 106 98 - 111 mmol/L    CO2 25 22 - 32 mmol/L    Glucose, Bld 125 (H) 70 - 99 mg/dL    BUN 9 8 - 23 mg/dL    Creatinine, Ser 0.90 0.44 - 1.00 mg/dL    Calcium 8.9 8.9 - 10.3 mg/dL    GFR calc non Af Amer >60 >60 mL/min    GFR calc Af Amer >60 >60 mL/min    Anion gap 8 5 - 15  Lipid panel     Status: Abnormal    Collection Time: 06/15/19  6:44 AM  Result Value Ref Range    Cholesterol 204 (H) 0 - 200 mg/dL    Triglycerides 161 (H) <150 mg/dL    HDL 35 (L) >40 mg/dL    Total CHOL/HDL Ratio 5.8 RATIO    VLDL 32 0 - 40 mg/dL    LDL Cholesterol 137 (H) 0 - 99 mg/dL  Glucose, capillary     Status: Abnormal    Collection Time: 06/15/19 12:27 PM  Result Value Ref Range    Glucose-Capillary 137 (H) 70 - 99 mg/dL       Imaging Results (Last 48 hours)  CT Code Stroke CTA Head W/WO contrast   Result Date: 06/14/2019 CLINICAL DATA:  Ataxia.  Facial droop. EXAM: CT  ANGIOGRAPHY HEAD AND NECK CT PERFUSION BRAIN TECHNIQUE: Multidetector CT imaging of the head and neck was performed using the  standard protocol during bolus administration of intravenous contrast. Multiplanar CT image reconstructions and MIPs were obtained to evaluate the vascular anatomy. Carotid stenosis measurements (when applicable) are obtained utilizing NASCET criteria, using the distal internal carotid diameter as the denominator. Multiphase CT imaging of the brain was performed following IV bolus contrast injection. Subsequent parametric perfusion maps were calculated using RAPID software. CONTRAST:  167mL OMNIPAQUE IOHEXOL 350 MG/ML SOLN COMPARISON:  CT head without contrast of the same day. FINDINGS: CTA NECK FINDINGS Aortic arch: There is common origin of the left common carotid artery and the innominate artery. Aortic arch and great vessel origins are within normal limits otherwise. No atherosclerotic change, stenosis, or aneurysm is present. Right carotid system: The right common carotid artery is within normal limits. Bifurcation is unremarkable. Cervical right ICA is normal. Left carotid system: The left common carotid artery is within normal limits. Mild atherosclerotic changes are noted in the proximal left ICA without stenosis significant stenosis relative to the more distal vessel. Vertebral arteries: The left vertebral artery is the dominant vessel. Both vertebral arteries originate from the subclavian arteries without significant stenosis. There is no significant stenosis of either vertebral artery in the neck. Skeleton: Degenerative anterolisthesis present C3-4 and C4-5. Endplate changes contribute to mild foraminal narrowing bilaterally at C5-6. No focal lytic or blastic lesions are present. Other neck: Neck soft tissues are otherwise within normal limits. Upper chest: Lung apices are clear. Thoracic inlet is within normal limits. Review of the MIP images confirms the above findings CTA HEAD FINDINGS Anterior circulation: Atherosclerotic changes are present within the cavernous internal carotid arteries bilaterally  without significant stenosis through the ICA termini. Left posterior communicating artery is noted. The A1 and M1 segments are normal. Left A1 segment is dominant. The anterior communicating artery is patent. MCA bifurcations are within normal limits bilaterally. The ACA and MCA branch vessels are within normal limits. Posterior circulation: Atherosclerotic calcifications are present at the dural margin of the left vertebral artery. 50% stenosis is present. Is some narrowing of distal vertebral arteries scratched at there is some narrowing of distal V4 segments bilaterally. The proximal basilar artery is small. A high-grade mid basilar stenosis is present at the level of the pons. There is flow in the distal basilar artery. A left posterior communicating artery contributes. Moderate proximal PCA stenoses are present bilaterally. There is attenuation of distal PCA branch vessels, right greater than left. Venous sinuses: The dural sinuses are patent. The straight sinus deep cerebral veins are intact. Cortical veins are unremarkable. No vascular malformations are present. Anatomic variants: Prominent left posterior communicating artery. Review of the MIP images confirms the above findings CT Brain Perfusion Findings: ASPECTS: 10/10 CBF (<30%) Volume: 9mL Perfusion (Tmax>6.0s) volume: 89mL. There is some indication of ischemia in the cerebellum when a shorter T-max is used. Mismatch Volume: 9mL Infarction Location:Posterior fossa ischemia IMPRESSION: 1. 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 2. Moderate proximal PCA stenoses bilaterally. 3. 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 4. CT perfusion demonstrates no anterior circulation infarct or ischemia. 5. Question ischemia within the cerebellum. 6. Atherosclerotic changes at the left proximal ICA and bilateral cavernous internal carotid arteries without significant stenosis in the  anterior circulation. These results were called by telephone at the time of interpretation on 06/14/2019 at 7:58 am  to provider MCNEILL [facility] , who verbally acknowledged these results. Electronically Signed   By: San Morelle M.D.   On: 06/14/2019 08:13    CT HEAD WO CONTRAST   Result Date: 06/15/2019 CLINICAL DATA:  Follow-up stroke.  24 hours post tPA. EXAM: CT HEAD WITHOUT CONTRAST TECHNIQUE: Contiguous axial images were obtained from the base of the skull through the vertex without intravenous contrast. COMPARISON:  MRI and CT studies done yesterday. FINDINGS: Brain: Extensive low-density within the left side of the pons. No hemorrhagic transformation. No focal cerebellar finding. Cerebral hemispheres are normal. No hydrocephalus or extra-axial collection. Vascular: There is atherosclerotic calcification of the major vessels at the base of the brain. Skull: Negative Sinuses/Orbits: Clear/normal Other: None IMPRESSION: Low-density throughout the left side of the pons consistent with the acute infarction. No hemorrhagic transformation. No hydrocephalus. Electronically Signed   By: [name] Chimes M.D.   On: 06/15/2019 08:08    CT Code Stroke CTA Neck W/WO contrast   Result Date: 06/14/2019 CLINICAL DATA:  Ataxia.  Facial droop. EXAM: CT ANGIOGRAPHY HEAD AND NECK CT PERFUSION BRAIN TECHNIQUE: Multidetector CT imaging of the head and neck was performed using the standard protocol during bolus administration of intravenous contrast. Multiplanar CT image reconstructions and MIPs were obtained to evaluate the vascular anatomy. Carotid stenosis measurements (when applicable) are obtained utilizing NASCET criteria, using the distal internal carotid diameter as the denominator. Multiphase CT imaging of the brain was performed following IV bolus contrast injection. Subsequent parametric perfusion maps were calculated using RAPID software. CONTRAST:  179mL OMNIPAQUE IOHEXOL 350 MG/ML SOLN COMPARISON:  CT  head without contrast of the same day. FINDINGS: CTA NECK FINDINGS Aortic arch: There is common origin of the left common carotid artery and the innominate artery. Aortic arch and great vessel origins are within normal limits otherwise. No atherosclerotic change, stenosis, or aneurysm is present. Right carotid system: The right common carotid artery is within normal limits. Bifurcation is unremarkable. Cervical right ICA is normal. Left carotid system: The left common carotid artery is within normal limits. Mild atherosclerotic changes are noted in the proximal left ICA without stenosis significant stenosis relative to the more distal vessel. Vertebral arteries: The left vertebral artery is the dominant vessel. Both vertebral arteries originate from the subclavian arteries without significant stenosis. There is no significant stenosis of either vertebral artery in the neck. Skeleton: Degenerative anterolisthesis present C3-4 and C4-5. Endplate changes contribute to mild foraminal narrowing bilaterally at C5-6. No focal lytic or blastic lesions are present. Other neck: Neck soft tissues are otherwise within normal limits. Upper chest: Lung apices are clear. Thoracic inlet is within normal limits. Review of the MIP images confirms the above findings CTA HEAD FINDINGS Anterior circulation: Atherosclerotic changes are present within the cavernous internal carotid arteries bilaterally without significant stenosis through the ICA termini. Left posterior communicating artery is noted. The A1 and M1 segments are normal. Left A1 segment is dominant. The anterior communicating artery is patent. MCA bifurcations are within normal limits bilaterally. The ACA and MCA branch vessels are within normal limits. Posterior circulation: Atherosclerotic calcifications are present at the dural margin of the left vertebral artery. 50% stenosis is present. Is some narrowing of distal vertebral arteries scratched at there is some narrowing  of distal V4 segments bilaterally. The proximal basilar artery is small. A high-grade mid basilar stenosis is present at the level of the pons. There is flow in the distal basilar artery. A left posterior communicating artery contributes. Moderate proximal  PCA stenoses are present bilaterally. There is attenuation of distal PCA branch vessels, right greater than left. Venous sinuses: The dural sinuses are patent. The straight sinus deep cerebral veins are intact. Cortical veins are unremarkable. No vascular malformations are present. Anatomic variants: Prominent left posterior communicating artery. Review of the MIP images confirms the above findings CT Brain Perfusion Findings: ASPECTS: 10/10 CBF (<30%) Volume: 44mL Perfusion (Tmax>6.0s) volume: 99mL. There is some indication of ischemia in the cerebellum when a shorter T-max is used. Mismatch Volume: 70mL Infarction Location:Posterior fossa ischemia IMPRESSION: 1. 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 2. Moderate proximal PCA stenoses bilaterally. 3. 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 4. CT perfusion demonstrates no anterior circulation infarct or ischemia. 5. Question ischemia within the cerebellum. 6. Atherosclerotic changes at the left proximal ICA and bilateral cavernous internal carotid arteries without significant stenosis in the anterior circulation. These results were called by telephone at the time of interpretation on 06/14/2019 at 7:58 am to provider MCNEILL [facility] , who verbally acknowledged these results. Electronically Signed   By: San Morelle M.D.   On: 06/14/2019 08:13    MR BRAIN WO CONTRAST   Result Date: 06/14/2019 CLINICAL DATA:  Wake up stroke. Ataxia and facial droop. EXAM: MRI HEAD WITHOUT CONTRAST TECHNIQUE: Multiplanar, multiecho pulse sequences of the brain and surrounding structures were obtained without intravenous contrast.  COMPARISON:  CT head, CTA, and CT perfusion 06/14/2019 FINDINGS: Brain: Diffusion-weighted images demonstrate restricted diffusion in the left paramedian pons. No significant T2 or FLAIR signal is associated. There may be some diffusion abnormality in the midbrain. No acute hemorrhage is present. Minimal white matter disease is present otherwise, likely within normal limits for age. The ventricles are of normal size. No significant extraaxial fluid collection is present. Vascular: Abnormal flow signal is present in mid basilar artery consistent with known high-grade stenosis. Skull and upper cervical spine: The craniocervical junction is normal. Upper cervical spine is within normal limits. Marrow signal is unremarkable. Sinuses/Orbits: The paranasal sinuses and mastoid air cells are clear. Bilateral lens replacements are noted. Globes and orbits are otherwise unremarkable. IMPRESSION: 1. 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 2. 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 3. No other acute intracranial abnormality. These results were called by telephone at the time of interpretation on 06/14/2019 at 8:07am to provider MCNEILL [facility] , who verbally acknowledged these results. Electronically Signed   By: San Morelle M.D.   On: 06/14/2019 08:17    CT Code Stroke Cerebral Perfusion with contrast   Result Date: 06/14/2019 CLINICAL DATA:  Ataxia.  Facial droop. EXAM: CT ANGIOGRAPHY HEAD AND NECK CT PERFUSION BRAIN TECHNIQUE: Multidetector CT imaging of the head and neck was performed using the standard protocol during bolus administration of intravenous contrast. Multiplanar CT image reconstructions and MIPs were obtained to evaluate the vascular anatomy. Carotid stenosis measurements (when applicable) are obtained utilizing NASCET criteria, using the distal internal carotid diameter as the denominator. Multiphase CT imaging of the brain  was performed following IV bolus contrast injection. Subsequent parametric perfusion maps were calculated using RAPID software. CONTRAST:  149mL OMNIPAQUE IOHEXOL 350 MG/ML SOLN COMPARISON:  CT head without contrast of the same day. FINDINGS: CTA NECK FINDINGS Aortic arch: There is common origin of the left common carotid artery and the innominate artery. Aortic arch and great vessel origins are within normal limits otherwise. No atherosclerotic  change, stenosis, or aneurysm is present. Right carotid system: The right common carotid artery is within normal limits. Bifurcation is unremarkable. Cervical right ICA is normal. Left carotid system: The left common carotid artery is within normal limits. Mild atherosclerotic changes are noted in the proximal left ICA without stenosis significant stenosis relative to the more distal vessel. Vertebral arteries: The left vertebral artery is the dominant vessel. Both vertebral arteries originate from the subclavian arteries without significant stenosis. There is no significant stenosis of either vertebral artery in the neck. Skeleton: Degenerative anterolisthesis present C3-4 and C4-5. Endplate changes contribute to mild foraminal narrowing bilaterally at C5-6. No focal lytic or blastic lesions are present. Other neck: Neck soft tissues are otherwise within normal limits. Upper chest: Lung apices are clear. Thoracic inlet is within normal limits. Review of the MIP images confirms the above findings CTA HEAD FINDINGS Anterior circulation: Atherosclerotic changes are present within the cavernous internal carotid arteries bilaterally without significant stenosis through the ICA termini. Left posterior communicating artery is noted. The A1 and M1 segments are normal. Left A1 segment is dominant. The anterior communicating artery is patent. MCA bifurcations are within normal limits bilaterally. The ACA and MCA branch vessels are within normal limits. Posterior circulation:  Atherosclerotic calcifications are present at the dural margin of the left vertebral artery. 50% stenosis is present. Is some narrowing of distal vertebral arteries scratched at there is some narrowing of distal V4 segments bilaterally. The proximal basilar artery is small. A high-grade mid basilar stenosis is present at the level of the pons. There is flow in the distal basilar artery. A left posterior communicating artery contributes. Moderate proximal PCA stenoses are present bilaterally. There is attenuation of distal PCA branch vessels, right greater than left. Venous sinuses: The dural sinuses are patent. The straight sinus deep cerebral veins are intact. Cortical veins are unremarkable. No vascular malformations are present. Anatomic variants: Prominent left posterior communicating artery. Review of the MIP images confirms the above findings CT Brain Perfusion Findings: ASPECTS: 10/10 CBF (<30%) Volume: 19mL Perfusion (Tmax>6.0s) volume: 67mL. There is some indication of ischemia in the cerebellum when a shorter T-max is used. Mismatch Volume: 61mL Infarction Location:Posterior fossa ischemia IMPRESSION: 1. 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 2. Moderate proximal PCA stenoses bilaterally. 3. 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 4. CT perfusion demonstrates no anterior circulation infarct or ischemia. 5. Question ischemia within the cerebellum. 6. Atherosclerotic changes at the left proximal ICA and bilateral cavernous internal carotid arteries without significant stenosis in the anterior circulation. These results were called by telephone at the time of interpretation on 06/14/2019 at 7:58 am to provider MCNEILL [facility] , who verbally acknowledged these results. Electronically Signed   By: San Morelle M.D.   On: 06/14/2019 08:13    ECHOCARDIOGRAM COMPLETE   Result Date: 06/14/2019   ECHOCARDIOGRAM REPORT    Patient Name:   Nikeya A Kolarik Date of Exam: 06/14/2019 Medical Rec #:  [id number]        Height:       64.0 in Accession #:    [id number]       Weight:       169.0 lb Date of Birth:  1950-08-20         BSA:          1.82 m Patient Age:    89 years         BP:  114/101 mmHg Patient Gender: F                HR:           77 bpm. Exam Location:  Inpatient Procedure: 2D Echo Indications:    stroke 434.91  History:        Patient has no prior history of Echocardiogram examinations.                 Risk Factors:Hypertension and Former Smoker.  Sonographer:    Jannett Celestine RDCS (AE) Referring Phys: [phone] MCNEILL P KIRKPATRICK  Sonographer Comments: restricted mobility IMPRESSIONS  1. Left ventricular ejection fraction, by visual estimation, is 60 to 65%. The left ventricle has normal function. There is no left ventricular hypertrophy.  2. Left ventricular diastolic parameters are consistent with Grade I diastolic dysfunction (impaired relaxation).  3. The left ventricle has no regional wall motion abnormalities.  4. Global right ventricle has normal systolic function.The right ventricular size is normal. No increase in right ventricular wall thickness.  5. Left atrial size was normal.  6. Right atrial size was normal.  7. Mild mitral annular calcification.  8. The mitral valve is normal in structure. No evidence of mitral valve regurgitation. No evidence of mitral stenosis.  9. The tricuspid valve is normal in structure. Tricuspid valve regurgitation is trivial. 10. The aortic valve is tricuspid. Aortic valve regurgitation is not visualized. No evidence of aortic valve sclerosis or stenosis. 11. The inferior vena cava is normal in size with greater than 50% respiratory variability, suggesting right atrial pressure of 3 mmHg. 12. TR signal is inadequate for assessing pulmonary artery systolic pressure. FINDINGS  Left Ventricle: Left ventricular ejection fraction, by visual estimation, is 60 to 65%. The left  ventricle has normal function. The left ventricle has no regional wall motion abnormalities. The left ventricular internal cavity size was the left ventricle is normal in size. There is no left ventricular hypertrophy. Left ventricular diastolic parameters are consistent with Grade I diastolic dysfunction (impaired relaxation). Right Ventricle: The right ventricular size is normal. No increase in right ventricular wall thickness. Global RV systolic function is has normal systolic function. Left Atrium: Left atrial size was normal in size. Right Atrium: Right atrial size was normal in size Pericardium: There is no evidence of pericardial effusion. Mitral Valve: The mitral valve is normal in structure. Mild mitral annular calcification. No evidence of mitral valve regurgitation. No evidence of mitral valve stenosis by observation. Tricuspid Valve: The tricuspid valve is normal in structure. Tricuspid valve regurgitation is trivial. Aortic Valve: The aortic valve is tricuspid. Aortic valve regurgitation is not visualized. The aortic valve is structurally normal, with no evidence of sclerosis or stenosis. Pulmonic Valve: The pulmonic valve was normal in structure. Pulmonic valve regurgitation is not visualized. Pulmonic regurgitation is not visualized. Aorta: The aortic root is normal in size and structure. Venous: The inferior vena cava is normal in size with greater than 50% respiratory variability, suggesting right atrial pressure of 3 mmHg. IAS/Shunts: No atrial level shunt detected by color flow Doppler.  LEFT VENTRICLE PLAX 2D LVIDd:         3.30 cm  Diastology LVIDs:         1.70 cm  LV e' lateral:   15.20 cm/s LV PW:         1.10 cm  LV E/e' lateral: 4.1 LV IVS:        1.00 cm  LV e' medial:    8.92  cm/s LVOT diam:     1.90 cm  LV E/e' medial:  6.9 LV SV:         36 ml LV SV Index:   18.98 LVOT Area:     2.84 cm  RIGHT VENTRICLE TAPSE (M-mode): 1.9 cm LEFT ATRIUM             Index LA diam:        3.20 cm 1.76  cm/m LA Vol (A2C):   43.7 ml 23.99 ml/m LA Vol (A4C):   41.2 ml 22.62 ml/m LA Biplane Vol: 42.4 ml 23.28 ml/m  AORTIC VALVE LVOT Vmax:   90.10 cm/s LVOT Vmean:  63.600 cm/s LVOT VTI:    0.261 m  AORTA Ao Root diam: 2.90 cm MITRAL VALVE MV Area (PHT): 2.16 cm             SHUNTS MV PHT:        101.79 msec          Systemic VTI:  0.26 m MV Decel Time: 351 msec             Systemic Diam: 1.90 cm MV E velocity: 61.70 cm/s 103 cm/s MV A velocity: 43.30 cm/s 70.3 cm/s MV E/A ratio:  1.42       1.5  Loralie Champagne MD Electronically signed by Loralie Champagne MD Signature Date/Time: 06/14/2019/5:12:56 PM    Final     CT HEAD CODE STROKE WO CONTRAST   Result Date: 06/14/2019 CLINICAL DATA:  Code stroke. Ataxia. Facial droop and slurred speech. Last seen normal at 9:30 p.m. last night. EXAM: CT HEAD WITHOUT CONTRAST TECHNIQUE: Contiguous axial images were obtained from the base of the skull through the vertex without intravenous contrast. COMPARISON:  None. FINDINGS: Brain: No acute infarct, hemorrhage, or mass lesion is present. The ventricles are of normal size. No significant white matter lesions are present. No significant extraaxial fluid collection is present. Vascular: Atherosclerotic calcifications are present within the cavernous internal carotid arteries bilaterally. There is no hyperdense vessel. Skull: Calvarium is intact. No focal lytic or blastic lesions are present. No significant extracranial soft tissue lesion is present. Sinuses/Orbits: The paranasal sinuses and mastoid air cells are clear. Bilateral lens replacements are noted. Globes and orbits are otherwise unremarkable. ASPECTS [facility] Stroke Program Early CT Score) - Ganglionic level infarction (caudate, lentiform nuclei, internal capsule, insula, M1-M3 cortex): 7/7 - Supraganglionic infarction (M4-M6 cortex): 3/3 Total score (0-10 with 10 being normal): 10/10 IMPRESSION: 1. Negative CT of the head. 2. ASPECTS is 10/10 The above was relayed via text  pager to Dr. Leonel Ramsay on 06/14/2019 at 07:32 . Electronically Signed   By: San Morelle M.D.   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 1. Does the need for close, 24 hr/day medical supervision in concert with the patient's rehab needs make it unreasonable for this patient to be served in a less intensive setting? Yes 2. Co-Morbidities requiring supervision/potential complications: Hypertension, dysarthria 3. Due to bladder management, bowel management, safety, skin/wound care, disease management, medication administration, pain management and patient education, does the patient require 24 hr/day rehab nursing? Yes 4. Does the patient require coordinated care of a physician, rehab nurse, therapy disciplines of PT, OT, speech therapy to address physical and functional deficits in the context of the above medical diagnosis(es)? Yes Addressing deficits in the following areas: balance,  endurance, locomotion, strength, transferring, bowel/bladder control, bathing, dressing, feeding, grooming, toileting, cognition and psychosocial support 5. Can the patient actively participate in an intensive therapy program of at least 3 hrs of therapy per day at least 5 days per week? Yes 6. The potential for patient to make measurable gains while on inpatient rehab is good 7. Anticipated functional outcomes upon discharge from inpatient rehab are supervision  with PT, supervision with OT, modified independent with SLP. 8. Estimated rehab length of stay to reach the above functional goals is: 10 to 14 days 9. Anticipated discharge destination: Home 10. Overall Rehab/Functional Prognosis: good   RECOMMENDATIONS: This patient's condition is appropriate for continued rehabilitative care in the following setting: CIR Patient has agreed to participate in recommended program.  Yes Note that insurance prior authorization may be required for reimbursement for recommended care.   Comment: Neurology and interventional neuroradiology will need to determine timing of basilar artery stenting procedure.  If it is within the next couple days, would recommend performing this first and then coming to rehabilitation     Cathlyn Parsons, PA-C 06/15/2019  "I have personally performed a face to face diagnostic evaluation of this patient.  Additionally, I have reviewed and concur with the physician assistant's documentation above." Charlett Blake M.D. [facility] FAAPM&R (Neuromuscular Med) Diplomate Am Board of Electrodiagnostic Med Fellow Am Board of Interventional Pain            Revision History                     Routing History

## 2019-06-19 NOTE — Progress Notes (Signed)
Inpatient Rehabilitation Admissions Coordinator  Inpatient rehab bed for today not available. I have met with patient at bedside and she is aware. I also contacted Dr. Erlinda Hong. We will plan admit tomorrow.  Danne Baxter, RN, MSN Rehab Admissions Coordinator [phone] 06/19/2019 4:24 PM

## 2019-06-19 NOTE — PMR Pre-admission (Signed)
PMR Admission Coordinator Pre-Admission Assessment  Patient: Megan Martinez is an 69 y.o., female MRN: [id number] DOB: 10/06/1950 Height: 5\' 4"  (162.6 cm) Weight: 76.7 kg              Insurance Information HMO:     PPO: yes     PCP:      IPA:      80/20:      OTHER:  PRIMARY: [employer]      Policy#: [id number]      Subscriber: pt CM Name: Colletta [place]      Phone#: [phone]     Fax#: Epic access Pre-Cert#: [id number] approved for 7 days      Employer:  Benefits:  Phone #: [phone]     Name: 1/26 Eff. Date: 05/25/2019     Deduct: none      Out of Pocket Max: $3400      Life Max: none CIR: $295 co pay per day days 1 until 6      SNF: no co pay days 1 until 20; $178 co pay per day days 21 until 100 Outpatient: $15 co py per visit     Co-Pay: visits per medical neccesity Home Health: 100%      Co-Pay: visits per medical neccesity DME: 80%     Co-Pay: 20% Providers: in network  SECONDARY: none        Medicaid Application Date:       Case Manager:  Disability Application Date:       Case Worker:   The "Data Collection Information Summary" for patients in Inpatient Rehabilitation Facilities with attached "Privacy Act [place] Records" was provided and verbally reviewed with: Patient  Emergency Contact Information Contact Information    Name Relation Home Work Mobile   Wyer,Richard Significant other [phone]       Current Medical History  Patient Admitting Diagnosis:  CVA   History of Present Illness: 69 year old right-handed female with history of hypertension.   Presented 06/14/2019 with right-sided weakness and speech difficulty.  Cranial CT scan negative.  Patient did receive TPA.  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  Moderate proximal PCA stenosis bilaterally.  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  MRI showed acute subacute  nonhemorrhagic left paramedian pontine ischemic infarction.  Echocardiogram with ejection fraction 65% without emboli.  Admission chemistries with potassium 3.0, urine drug screen negative.  Neurology follow-up maintained on aspirin and Plavix for CVA prophylaxis x3 months then Plavix alone.  Subcutaneous Lovenox for DVT prophylaxis.  Tolerating mechanical soft diet.  Maintain on Cardene drip initially for blood pressure control.  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    Complete NIHSS TOTAL: 10 Glasgow Coma Scale Score: 15  Past Medical History  Past Medical History:  Diagnosis Date  . Allergy   . Arthritis   . Hypertension   . Thyroid disease     Family History  family history is not on file.  Prior Rehab/Hospitalizations:  Has the patient had prior rehab or hospitalizations prior to admission? Yes  Has the patient had major surgery during 100 days prior to admission? No  Current Medications   Current Facility-Administered Medications:  .  acetaminophen (TYLENOL) tablet 650 mg, 650 mg, Oral, Q4H PRN, 650 mg at  06/14/19 2158 **OR** acetaminophen (TYLENOL) 160 MG/5ML solution 650 mg, 650 mg, Per Tube, Q4H PRN **OR** acetaminophen (TYLENOL) suppository 650 mg, 650 mg, Rectal, Q4H PRN, Donzetta Starch, NP .  [COMPLETED] aspirin EC tablet 325 mg, 325 mg, Oral, Once, 325 mg at 06/15/19 1049 **FOLLOWED BY** aspirin EC tablet 325 mg, 325 mg, Oral, Daily, Rosalin Hawking, MD, 325 mg at 06/19/19 0905 .  atorvastatin (LIPITOR) tablet 80 mg, 80 mg, Oral, q1800, Rosalin Hawking, MD, 80 mg at 06/18/19 1756 .  Chlorhexidine Gluconate Cloth 2 % PADS 6 each, 6 each, Topical, Daily, Biby, Sharon L, NP, 6 each at 06/17/19 1058 .  clopidogrel (PLAVIX) tablet 300 mg, 300 mg, Oral, Daily, 300 mg at 06/15/19 1049 **OR** clopidogrel (PLAVIX) tablet 75 mg, 75 mg, Oral,  Daily, Biby, Sharon L, NP, 75 mg at 06/19/19 0905 .  enoxaparin (LOVENOX) injection 40 mg, 40 mg, Subcutaneous, Q24H, Rosalin Hawking, MD, 40 mg at 06/18/19 1951 .  hydrALAZINE (APRESOLINE) injection 5 mg, 5 mg, Intravenous, Q4H PRN, Rinehuls, David L, PA-C .  levothyroxine (SYNTHROID) tablet 137 mcg, 137 mcg, Oral, Q0600, Donzetta Starch, NP, 137 mcg at 06/19/19 0617 .  pantoprazole (PROTONIX) EC tablet 40 mg, 40 mg, Oral, QHS, Biby, Sharon L, NP, 40 mg at 06/18/19 2149 .  potassium chloride SA (KLOR-CON) CR tablet 20 mEq, 20 mEq, Oral, Daily, Rinehuls, David L, PA-C, 20 mEq at 06/19/19 [id number]  Patients Current Diet:  Diet Order            DIET DYS 3 Room service appropriate? Yes; Fluid consistency: Thin  Diet effective now              Precautions / Restrictions Precautions Precautions: Fall Precaution Comments: right hemiparesis Restrictions Weight Bearing Restrictions: No   Has the patient had 2 or more falls or a fall with injury in the past year?No  Prior Activity Level Community (5-7x/wk): Independent and driving pta; retired  Prior Functional Level Prior Function Level of Independence: Independent  Self Care: Did the patient need help bathing, dressing, using the toilet or eating?  Independent  Indoor Mobility: Did the patient need assistance with walking from room to room (with or without device)? Independent  Stairs: Did the patient need assistance with internal or external stairs (with or without device)? Independent  Functional Cognition: Did the patient need help planning regular tasks such as shopping or remembering to take medications? Independent  Home Assistive Devices / Equipment Home Assistive Devices/Equipment: None Home Equipment: None  Prior Device Use: Indicate devices/aids used by the patient prior to current illness, exacerbation or injury? None of the above  Current Functional Level Cognition  Overall Cognitive Status: Within Functional Limits for  tasks assessed Orientation Level: Oriented X4    Extremity Assessment (includes Sensation/Coordination)  Upper Extremity Assessment: RUE deficits/detail RUE Deficits / Details: scapula activation noted and extension with tone with blood pressure cuff. pt reports noraml sensation RUE Coordination: decreased gross motor, decreased fine motor  Lower Extremity Assessment: Defer to PT evaluation RLE Deficits / Details: hip flexion 1/5, knee flexion 2-/5, knee extenstion 2-/5, dorsiflexion 1/5    ADLs  Overall ADL's : Needs assistance/impaired Eating/Feeding: Minimal assistance Grooming: Minimal assistance, Sitting Upper Body Bathing: Moderate assistance Lower Body Bathing: Moderate assistance Upper Body Dressing : Minimal assistance Lower Body Dressing: Moderate assistance Lower Body Dressing Details (indicate cue type and reason): requires OT to help thread socks and knee flexion for RLE to don sock. pt able to pull sock  on with L UE Toilet Transfer: +2 for physical assistance, Moderate assistance General ADL Comments: OOB to chair this session with noted RLE bucklet required blocking    Mobility  Overal bed mobility: Needs Assistance Bed Mobility: Sit to Supine Supine to sit: Mod assist, HOB elevated Sit to supine: Min assist General bed mobility comments: assistance to lift B LEs back to bed.  Pt able to follow commands to boost to [facility].    Transfers  Overall transfer level: Needs assistance Equipment used: Rolling walker (2 wheeled) Transfers: Sit to/from Stand Sit to Stand: Mod assist, +2 safety/equipment Stand pivot transfers: Mod assist, +2 physical assistance General transfer comment: Hand over hand on R side.  Pt required moderate assistance to boost into standing.    Ambulation / Gait / Stairs / Wheelchair Mobility  Ambulation/Gait Ambulation/Gait assistance: Mod assist, +2 safety/equipment Gait Distance (Feet): 15 Feet Assistive device: Rolling walker (2 wheeled) Gait  Pattern/deviations: Decreased dorsiflexion - right, Decreased step length - left, Narrow base of support, Decreased stance time - right General Gait Details: Pt performed gt training with cues for sequencing and emphasis on R knee extension.  Noted buckling in R stance phase. Gait velocity: decreased    Posture / Balance Dynamic Sitting Balance Sitting balance - Comments: pt able to sit EOB with guarding Balance Overall balance assessment: Needs assistance Sitting-balance support: Single extremity supported, No upper extremity supported, Feet supported Sitting balance-Leahy Scale: Fair Sitting balance - Comments: pt able to sit EOB with guarding Standing balance support: Bilateral upper extremity supported Standing balance-Leahy Scale: Poor Standing balance comment: bil UE support with RLE supported    Special needs/care consideration BiPAP/CPAP CPM Continuous Drip IV Dialysis         Life Vest Oxygen Special Bed Trach Size Wound Vac  Skin       Bowel mgmt: LBM 1/26 continent Bladder mgmt: continent Diabetic mgmt Hgb A1c 5.8 Behavioral consideration  Chemo/radiation  Designated visitor is boyfriend, Richard BAS stent at d/c from [place], admit to ICU and d/c home from ICU   Previous Home Environment  Living Arrangements: Alone  Lives With: Alone Available Help at Discharge: Family, Friend(s)(boyfriend, Richard and daughter, [name] will provide 24/7) Type of Home: House Home Layout: Two level, Laundry or work area in [place] Access: Stairs to enter Technical brewer of Steps: 2 Bathroom Shower/Tub: Public librarian, Multimedia programmer: Programmer, systems: Yes How Accessible: Accessible via walker [place]: No Additional Comments: boyfriend of 11 years lives next door; [name] is Radiation protection practitioner and unemployed  Discharge Living Setting Plans for Discharge Living Setting: Patient's home, Alone Type of Home at Discharge: House Discharge  Home Layout: Laundry or work area in basement, Two level Discharge Home Access: Stairs to enter Entrance Stairs-Rails: None Technical brewer of Steps: 2 Discharge Bathroom Shower/Tub: Tub/shower unit, Horticulturist, commercial: Standard Discharge Bathroom Accessibility: Yes How Accessible: Accessible via walker Does the patient have any problems obtaining your medications?: No  Social/Family/Support Systems Patient Roles: Partner, Parent Contact Information: Richard, boyfriend Anticipated Caregiver: [name] and [employer] Anticipated Ambulance person Information: [phone], Richard Ability/Limitations of Caregiver: none Caregiver Availability: 24/7 Discharge Plan Discussed with Primary Caregiver: Yes Is Caregiver In Agreement with Plan?: Yes Does Caregiver/Family have Issues with Lodging/Transportation while Pt is in Rehab?: No  Goals/Additional Needs Patient/Family Goal for Rehab: supervision PT, supervision to min OT, supervision SLP Expected length of stay: elos 10 TO 14 DAYS Special Service Needs: Will have BAS stenting after CIR by  Dr. Estanislado Pandy. We are to alert IR of planned d/c date. Pt will d/c to acute for stent then recovery at ICU overnight and then d/c home from acute Pt/Family Agrees to Admission and willing to participate: Yes Program Orientation Provided & Reviewed with Pt/Caregiver Including Roles  & Responsibilities: Yes  Decrease burden of Care through IP rehab admission:   Possible need for SNF placement upon discharge:  Patient Condition: This patient's condition remains as documented in the consult dated 06/18/2019, in which the Rehabilitation Physician determined and documented that the patient's condition is appropriate for intensive rehabilitative care in an inpatient rehabilitation facility. Will admit to inpatient rehab today.  Preadmission Screen Completed By:  Cleatrice Burke, RN, 06/19/2019 10:18  AM ______________________________________________________________________   Discussed status with Dr. Dagoberto Ligas on 06/19/2019 at  29 and received approval for admission today.  Admission Coordinator:  Cleatrice Burke, time [id number] Date 06/19/2019

## 2019-06-19 NOTE — Progress Notes (Signed)
Inpatient Rehabilitation Admissions Coordinator  I have insurance approval and bed to admit to CIR today. I met with patient at bedside and she is in agreement. I notified Dr. Erlinda Hong, RN [place], Vida Roller and SW, [place]. I will make the arrangements to admit today.  Danne Baxter, RN, MSN Rehab Admissions Coordinator [phone] 06/19/2019 10:08 AM

## 2019-06-19 NOTE — Progress Notes (Signed)
STROKE TEAM PROGRESS NOTE   INTERVAL HISTORY Pt sitting in chair for lunch. She has worked with PT OT today and was able to walk with assistance in room. She felt encouraged. Pending CIR>   Vitals:   06/19/19 0412 06/19/19 0733 06/19/19 1212 06/19/19 1615  BP: 137/70 (!) 145/67 (!) 160/86 111/73  Pulse: (!) 46 (!) 46 (!) 58 (!) 53  Resp: 18 17 15 17   Temp: 97.9 F (36.6 C) 98 F (36.7 C) 98.4 F (36.9 C) 98.6 F (37 C)  TempSrc: Oral Oral  Oral  SpO2: 96% 96% 97% 96%  Weight:      Height:        CBC:  Recent Labs  Lab 06/14/19 0719 06/14/19 0725 06/18/19 0246 06/19/19 0250  WBC 5.4   < > 7.2 7.0  NEUTROABS 3.2  --   --   --   HGB 14.6   < > 13.1 13.3  HCT 43.9   < > 38.6 38.8  MCV 86.6   < > 86.0 86.6  PLT 295   < > 251 237   < > = values in this interval not displayed.    Basic Metabolic Panel:  Recent Labs  Lab 06/18/19 0246 06/19/19 0250  NA 142 142  K 3.5 3.8  CL 106 107  CO2 24 24  GLUCOSE 103* 100*  BUN 9 13  CREATININE 0.88 0.88  CALCIUM 9.0 9.0   Lipid Panel:     Component Value Date/Time   CHOL 204 (H) 06/15/2019 0644   TRIG 161 (H) 06/15/2019 0644   HDL 35 (L) 06/15/2019 0644   CHOLHDL 5.8 06/15/2019 0644   VLDL 32 06/15/2019 0644   LDLCALC 137 (H) 06/15/2019 0644   HgbA1c:  Lab Results  Component Value Date   HGBA1C 5.8 (H) 06/15/2019   Urine Drug Screen:     Component Value Date/Time   LABOPIA NONE DETECTED 06/14/2019 1252   COCAINSCRNUR NONE DETECTED 06/14/2019 1252   LABBENZ NONE DETECTED 06/14/2019 1252   AMPHETMU NONE DETECTED 06/14/2019 1252   THCU NONE DETECTED 06/14/2019 1252   LABBARB NONE DETECTED 06/14/2019 1252    Alcohol Level     Component Value Date/Time   ETH <10 06/14/2019 0719    IMAGING past 48 hours No results found.  PHYSICAL EXAM    Temp:  [97.9 F (36.6 C)-98.6 F (37 C)] 98.6 F (37 C) (01/26 1615) Pulse Rate:  [46-58] 53 (01/26 1615) Resp:  [15-18] 17 (01/26 1615) BP: (111-160)/(59-86)  111/73 (01/26 1615) SpO2:  [96 %-97 %] 96 % (01/26 1615)  General - Well nourished, well developed, in no apparent distress.  Ophthalmologic - fundi not visualized due to noncooperation.  Cardiovascular - Regular rhythm and rate.  Mental Status -  Level of arousal and orientation to time, place, and person were intact. Language including expression, naming, repetition, comprehension was assessed and found intact. Mild dysarthria Fund of Knowledge was assessed and was intact.  Cranial Nerves II - XII - II - Visual field intact OU. III, IV, VI - Extraocular movements intact. V - Facial sensation intact bilaterally. VII - right mild facial droop. VIII - Hearing & vestibular intact bilaterally. X - Palate elevates symmetrically. XI - Chin turning intact bilaterally. Right shoulder shrug weaker than left XII - Tongue protrusion intact.  Motor Strength - The patient's strength was normal in Left UE and LE, however, right UE 1+/5 and right LLE 3/5 proximal and 3+/5 knee extension, and 0/5 distally.  Motor Tone - Muscle tone was assessed at the neck and appendages and was normal.  Reflexes - The patient's reflexes were decreased on the right UE and LE and she had positive babinski on the right.  Sensory - Light touch, temperature/pinprick were assessed and were symmetrical.    Coordination - The patient had normal movements in the left hand and foot with no ataxia or dysmetria.  Tremor was absent.  Gait and Station - deferred.    ASSESSMENT/PLAN [name] is a 69 y.o. female with history of HTN and thyroid disease presenting with R sided weakness and dysarthria. Received tPA 06/14/2019 at 0817.  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 ASPECTS 10.    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 Moderate B proximal PCA stenoses. L VA  50% stenosis w/ narrowing distal  V4  CT perfusion no infarct   MRI  L paramedian pontine infarct. High-grade mid BA stenosis.   CT at 24h low density L ponts. No hemorrhage   2D Echo EF 60-65%. No source of embolus    LDL 137  HgbA1c 5.8  lovenox for VTE prophylaxis  aspirin 81 mg daily prior to admission, now on aspirin 325 mg daily, clopidogrel 75 mg daily following aspirin and Plavix load. Continue DAPT x 3 months then plavix alone for intracranial stenosis.  Therapy recommendations: CIR  Disposition:  pending   Medically ready for transfer to rehab.   BA stenosis  Given the severity, this will need to be intervened  Dr. Estanislado Pandy plans for [facility] stent at the end of her rehab stay  Avoid low BP  Pt ready for CIR  Hypertension  Home meds:  norvasc 10, cadesartan-HCTZ 32-25   Stable now 135-160  Now on no BP meds . Long-term BP goal 130-160 before BA intervention given BA stenosis  . Avoid low BP  Hyperlipidemia   Not on statin PTA  LDL 137, goal < 70  On lipitor 80  No statin allergy as per pt  Continue statin on discharge  Dysphagia, resolved . Secondary to stroke . Cleared for diet by Speech  . On diet and thin liquid   Other Stroke Risk Factors  Advanced age  Former Cigarette smoker, quit 51 yrs ago  ETOH use, alcohol level <10, advised to drink no more than 1 drink(s) a day  Other Active Problems  Hypokalemia resolved. K 3.0-3.1-3.3 -3.5->3.4 ->3.5-3.8 - continue daily potassium supplement now  Hypothyroid on synthroid  Bradycardia - [facility]  Hospital day # 5  Rosalin Hawking, MD PhD Stroke Neurology 06/19/2019 4:23 PM   To contact Stroke Continuity provider, please refer to [email or web address] After hours, contact General Neurology

## 2019-06-20 ENCOUNTER — Inpatient Hospital Stay (HOSPITAL_COMMUNITY): Payer: PPO | Admitting: Speech Pathology

## 2019-06-20 ENCOUNTER — Other Ambulatory Visit: Payer: Self-pay

## 2019-06-20 ENCOUNTER — Inpatient Hospital Stay (HOSPITAL_COMMUNITY): Payer: PPO

## 2019-06-20 ENCOUNTER — Inpatient Hospital Stay (HOSPITAL_COMMUNITY)
Admission: RE | Admit: 2019-06-20 | Discharge: 2019-07-05 | DRG: 057 | Disposition: A | Discharge status: Home / Self Care | Payer: PPO | Source: Intra-hospital | Attending: Physical Medicine & Rehabilitation | Admitting: Physical Medicine & Rehabilitation

## 2019-06-20 ENCOUNTER — Inpatient Hospital Stay (HOSPITAL_COMMUNITY): Payer: PPO | Admitting: Occupational Therapy

## 2019-06-20 DIAGNOSIS — Z79899 Other long term (current) drug therapy: Secondary | ICD-10-CM | POA: Diagnosis not present

## 2019-06-20 DIAGNOSIS — N182 Chronic kidney disease, stage 2 (mild): Secondary | ICD-10-CM | POA: Diagnosis not present

## 2019-06-20 DIAGNOSIS — E46 Unspecified protein-calorie malnutrition: Secondary | ICD-10-CM | POA: Diagnosis not present

## 2019-06-20 DIAGNOSIS — I69322 Dysarthria following cerebral infarction: Secondary | ICD-10-CM

## 2019-06-20 DIAGNOSIS — G8191 Hemiplegia, unspecified affecting right dominant side: Secondary | ICD-10-CM

## 2019-06-20 DIAGNOSIS — Z6829 Body mass index (BMI) 29.0-29.9, adult: Secondary | ICD-10-CM

## 2019-06-20 DIAGNOSIS — E039 Hypothyroidism, unspecified: Secondary | ICD-10-CM | POA: Diagnosis present

## 2019-06-20 DIAGNOSIS — R1312 Dysphagia, oropharyngeal phase: Secondary | ICD-10-CM | POA: Diagnosis not present

## 2019-06-20 DIAGNOSIS — I6300 Cerebral infarction due to thrombosis of unspecified precerebral artery: Secondary | ICD-10-CM | POA: Diagnosis not present

## 2019-06-20 DIAGNOSIS — I6350 Cerebral infarction due to unspecified occlusion or stenosis of unspecified cerebral artery: Secondary | ICD-10-CM

## 2019-06-20 DIAGNOSIS — E785 Hyperlipidemia, unspecified: Secondary | ICD-10-CM | POA: Diagnosis not present

## 2019-06-20 DIAGNOSIS — I651 Occlusion and stenosis of basilar artery: Secondary | ICD-10-CM | POA: Diagnosis present

## 2019-06-20 DIAGNOSIS — I69391 Dysphagia following cerebral infarction: Secondary | ICD-10-CM

## 2019-06-20 DIAGNOSIS — M21331 Wrist drop, right wrist: Secondary | ICD-10-CM

## 2019-06-20 DIAGNOSIS — I10 Essential (primary) hypertension: Secondary | ICD-10-CM | POA: Diagnosis not present

## 2019-06-20 DIAGNOSIS — I129 Hypertensive chronic kidney disease with stage 1 through stage 4 chronic kidney disease, or unspecified chronic kidney disease: Secondary | ICD-10-CM | POA: Diagnosis present

## 2019-06-20 DIAGNOSIS — M1990 Unspecified osteoarthritis, unspecified site: Secondary | ICD-10-CM | POA: Diagnosis not present

## 2019-06-20 DIAGNOSIS — E876 Hypokalemia: Secondary | ICD-10-CM | POA: Diagnosis not present

## 2019-06-20 DIAGNOSIS — Z885 Allergy status to narcotic agent status: Secondary | ICD-10-CM

## 2019-06-20 DIAGNOSIS — Z91040 Latex allergy status: Secondary | ICD-10-CM

## 2019-06-20 DIAGNOSIS — Z880 Allergy status to penicillin: Secondary | ICD-10-CM

## 2019-06-20 DIAGNOSIS — Z79890 Hormone replacement therapy (postmenopausal): Secondary | ICD-10-CM

## 2019-06-20 DIAGNOSIS — M21371 Foot drop, right foot: Secondary | ICD-10-CM

## 2019-06-20 DIAGNOSIS — E782 Mixed hyperlipidemia: Secondary | ICD-10-CM

## 2019-06-20 DIAGNOSIS — I69351 Hemiplegia and hemiparesis following cerebral infarction affecting right dominant side: Principal | ICD-10-CM

## 2019-06-20 DIAGNOSIS — Z87891 Personal history of nicotine dependence: Secondary | ICD-10-CM | POA: Diagnosis not present

## 2019-06-20 DIAGNOSIS — R7401 Elevation of levels of liver transaminase levels: Secondary | ICD-10-CM | POA: Diagnosis not present

## 2019-06-20 DIAGNOSIS — Z888 Allergy status to other drugs, medicaments and biological substances status: Secondary | ICD-10-CM

## 2019-06-20 DIAGNOSIS — I639 Cerebral infarction, unspecified: Secondary | ICD-10-CM | POA: Diagnosis not present

## 2019-06-20 DIAGNOSIS — E8809 Other disorders of plasma-protein metabolism, not elsewhere classified: Secondary | ICD-10-CM | POA: Diagnosis not present

## 2019-06-20 DIAGNOSIS — I771 Stricture of artery: Secondary | ICD-10-CM | POA: Diagnosis not present

## 2019-06-20 DIAGNOSIS — R7303 Prediabetes: Secondary | ICD-10-CM

## 2019-06-20 DIAGNOSIS — R0989 Other specified symptoms and signs involving the circulatory and respiratory systems: Secondary | ICD-10-CM

## 2019-06-20 DIAGNOSIS — E890 Postprocedural hypothyroidism: Secondary | ICD-10-CM

## 2019-06-20 LAB — BASIC METABOLIC PANEL
Anion gap: 9 (ref 5–15)
BUN: 15 mg/dL (ref 8–23)
CO2: 25 mmol/L (ref 22–32)
Calcium: 9.1 mg/dL (ref 8.9–10.3)
Chloride: 107 mmol/L (ref 98–111)
Creatinine, Ser: 0.93 mg/dL (ref 0.44–1.00)
GFR calc Af Amer: >60 mL/min (ref >60)
GFR calc non Af Amer: >60 mL/min (ref >60)
Glucose, Bld: 99 mg/dL (ref 70–99)
Potassium: 3.7 mmol/L (ref 3.5–5.1)
Sodium: 141 mmol/L (ref 135–145)

## 2019-06-20 LAB — CBC
HCT: 40.9 % (ref 36.0–46.0)
Hemoglobin: 13.6 g/dL (ref 12.0–15.0)
MCH: 29.1 pg (ref 26.0–34.0)
MCHC: 33.3 g/dL (ref 30.0–36.0)
MCV: 87.4 fL (ref 80.0–100.0)
Platelets: 252 10*3/uL (ref 150–400)
RBC: 4.68 MIL/uL (ref 3.87–5.11)
RDW: 13.4 % (ref 11.5–15.5)
WBC: 6 10*3/uL (ref 4.0–10.5)
nRBC: 0 % (ref 0.0–0.2)

## 2019-06-20 MED ORDER — ACETAMINOPHEN 325 MG PO TABS
650.0000 mg | ORAL_TABLET | ORAL | Status: DC | PRN
  Administered 2019-06-27 – 2019-07-03 (×2): 650 mg via ORAL
  Filled 2019-06-20 (×2): qty 2

## 2019-06-20 MED ORDER — SORBITOL 70 % SOLN: 30.0000 mL | Freq: Every day | Status: DC | PRN

## 2019-06-20 MED ORDER — PANTOPRAZOLE SODIUM 40 MG PO TBEC
40.0000 mg | DELAYED_RELEASE_TABLET | Freq: Every day | ORAL | Status: DC
  Administered 2019-06-20 – 2019-07-04 (×15): 40 mg via ORAL
  Filled 2019-06-20 (×15): qty 1

## 2019-06-20 MED ORDER — ENOXAPARIN SODIUM 40 MG/0.4ML ~~LOC~~ SOLN
40.0000 mg | SUBCUTANEOUS | Status: DC
  Administered 2019-06-20 – 2019-07-04 (×16): 40 mg via SUBCUTANEOUS
  Filled 2019-06-20 (×15): qty 0.4

## 2019-06-20 MED ORDER — POTASSIUM CHLORIDE CRYS ER 20 MEQ PO TBCR
20.0000 meq | EXTENDED_RELEASE_TABLET | Freq: Every day | ORAL | Status: DC
  Administered 2019-06-21 – 2019-07-05 (×15): 20 meq via ORAL
  Filled 2019-06-20 (×15): qty 1

## 2019-06-20 MED ORDER — CLOPIDOGREL BISULFATE 75 MG PO TABS
75.0000 mg | ORAL_TABLET | Freq: Every day | ORAL | Status: DC
  Administered 2019-06-21 – 2019-07-05 (×15): 75 mg via ORAL
  Filled 2019-06-20 (×15): qty 1

## 2019-06-20 MED ORDER — ASPIRIN EC 325 MG PO TBEC
325.0000 mg | DELAYED_RELEASE_TABLET | Freq: Every day | ORAL | Status: DC
  Administered 2019-06-21 – 2019-07-05 (×15): 325 mg via ORAL
  Filled 2019-06-20 (×15): qty 1

## 2019-06-20 MED ORDER — ACETAMINOPHEN 650 MG RE SUPP: 650.0000 mg | RECTAL | Status: DC | PRN

## 2019-06-20 MED ORDER — ATORVASTATIN CALCIUM 80 MG PO TABS
80.0000 mg | ORAL_TABLET | Freq: Every day | ORAL | Status: DC
  Administered 2019-06-20 – 2019-07-04 (×15): 80 mg via ORAL
  Filled 2019-06-20 (×15): qty 1

## 2019-06-20 MED ORDER — LEVOTHYROXINE SODIUM 137 MCG PO TABS
137.0000 ug | ORAL_TABLET | Freq: Every day | ORAL | Status: DC
  Administered 2019-06-21 – 2019-07-05 (×15): 137 ug via ORAL
  Filled 2019-06-20 (×15): qty 1

## 2019-06-20 MED ORDER — ACETAMINOPHEN 160 MG/5ML PO SOLN: 650.0000 mg | ORAL | Status: DC | PRN

## 2019-06-20 MED ORDER — ENOXAPARIN SODIUM 40 MG/0.4ML ~~LOC~~ SOLN: 40.0000 mg | SUBCUTANEOUS | Status: DC

## 2019-06-20 NOTE — Progress Notes (Signed)
Physical Therapy Treatment Patient Details Name: Megan Martinez MRN: [id number] DOB: 1950/09/28 Today's Date: 06/20/2019    History of Present Illness 69 yo admitted with acute right sided weakness upon waking up. Pt s/p tPA with acute pontine infarct. PMHx: HTN, arthritis    PT Comments    Pt progressing well, R step height and length improved this session.  She remains to require moderate assistance to mobilize with a close chair follow.  Continue to recommend post acute rehab for aggressive therapies before returning home.    Follow Up Recommendations  CIR     Equipment Recommendations  Other (comment)(TBD at next venue)    Recommendations for Other Services       Precautions / Restrictions Precautions Precautions: Fall Precaution Comments: right hemiparesis Restrictions Weight Bearing Restrictions: No    Mobility  Bed Mobility Overal bed mobility: Needs Assistance Bed Mobility: Rolling;Sidelying to Sit Rolling: Min assist Sidelying to sit: Mod assist       General bed mobility comments: Pt required assistance to roll and elevate trunk into a seated position.  Assistance to mobilize RLE to edge of bed.  Transfers Overall transfer level: Needs assistance Equipment used: Rolling walker (2 wheeled) Transfers: Sit to/from Stand Sit to Stand: Mod assist         General transfer comment: Cues for hand placement to and from seated surface.  Hand over hand assistance to mobilize RUE.  Ambulation/Gait Ambulation/Gait assistance: Mod assist Gait Distance (Feet): 40 Feet(x2) Assistive device: Rolling walker (2 wheeled) Gait Pattern/deviations: Decreased dorsiflexion - right;Decreased step length - left;Narrow base of support;Decreased stance time - right Gait velocity: decreased   General Gait Details: Pt able to advance RLE with improved foot clearance.  Pt did not require ace wrapping this session but continues to lack dorsiflexion.   Stairs              Wheelchair Mobility    Modified Rankin (Stroke Patients Only) Modified Rankin (Stroke Patients Only) Pre-Morbid Rankin Score: No symptoms Modified Rankin: Moderately severe disability     Balance Overall balance assessment: Needs assistance Sitting-balance support: Single extremity supported;No upper extremity supported;Feet supported Sitting balance-Leahy Scale: Fair       Standing balance-Leahy Scale: Poor                              Cognition Arousal/Alertness: Awake/alert Behavior During Therapy: WFL for tasks assessed/performed Overall Cognitive Status: Within Functional Limits for tasks assessed                                        Exercises General Exercises - Lower Extremity Ankle Circles/Pumps: PROM;AAROM;Right;10 reps;Supine Heel Slides: AAROM;Right;10 reps;Supine Hip ABduction/ADduction: AAROM;Right;10 reps;Supine    General Comments        Pertinent Vitals/Pain Pain Assessment: Faces Faces Pain Scale: Hurts little more Pain Location: R LE and RUE Pain Descriptors / Indicators: Tingling Pain Intervention(s): Repositioned    Home Living                      Prior Function            PT Goals (current goals can now be found in the care plan section) Acute Rehab PT Goals Patient Stated Goal: return to my animals- dogs, cats, opossum Potential to Achieve Goals: Good Progress towards PT goals:  Progressing toward goals    Frequency    Min 4X/week      PT Plan Current plan remains appropriate    Co-evaluation              AM-PAC PT "6 Clicks" Mobility   Outcome Measure  Help needed turning from your back to your side while in a flat bed without using bedrails?: A Lot Help needed moving from lying on your back to sitting on the side of a flat bed without using bedrails?: A Lot Help needed moving to and from a bed to a chair (including a wheelchair)?: A Lot Help needed standing up from a  chair using your arms (e.g., wheelchair or bedside chair)?: A Lot Help needed to walk in hospital room?: A Lot Help needed climbing 3-5 steps with a railing? : Total 6 Click Score: 11    End of Session Equipment Utilized During Treatment: Gait belt Activity Tolerance: Patient tolerated treatment well Patient left: in chair;with call bell/phone within reach;with chair alarm set Nurse Communication: Mobility status;Precautions PT Visit Diagnosis: Hemiplegia and hemiparesis;Other symptoms and signs involving the nervous system (R29.898) Hemiplegia - Right/Left: Right Hemiplegia - dominant/non-dominant: Dominant Hemiplegia - caused by: Cerebral infarction     Time: [id number] PT Time Calculation (min) (ACUTE ONLY): 20 min  Charges:  $Gait Training: 8-22 mins                     Erasmo Leventhal , PTA Acute Rehabilitation Services Pager [phone] Office [phone]     Samyuktha Brau Eli Hose 06/20/2019, 3:37 PM

## 2019-06-20 NOTE — IPOC Note (Signed)
Individualized overall Plan of Care [facility]) Patient Details Name: Megan Martinez MRN: [id number] DOB: 1951/04/07  Admitting Diagnosis: Left pontine cerebrovascular accident [facility])  Hospital Problems: Principal Problem:   Left pontine cerebrovascular accident [facility]) Active Problems:   Transaminitis   Hypoalbuminemia due to protein-calorie malnutrition ([place])   Labile blood pressure     Functional Problem List: Nursing Bowel, Medication Management, Skin Integrity, Endurance  PT Balance, Edema, Endurance, Motor, Nutrition, Perception, Safety, Sensory, Skin Integrity, Pain, Behavior  OT Balance, Perception, Safety, Edema, Endurance, Motor  SLP Linguistic, Nutrition  TR         Basic ADL's: OT Grooming, Bathing, Dressing, Toileting     Advanced  ADL's: OT       Transfers: PT Bed Mobility, Bed to Chair, Car, [employer], Futures trader, Metallurgist: PT Ambulation, Emergency planning/management officer, Stairs     Additional Impairments: OT Fuctional Use of Upper Extremity  SLP Swallowing, Communication expression    TR      Anticipated Outcomes Item Anticipated Outcome  Self Feeding n/a  Swallowing  Mod I   Basic self-care  supervision  Toileting  mod I   Bathroom Transfers mod  I (only sit to stands) anticipate pt will walk into bathroom with supervision  Bowel/Bladder  Mod I assist  Transfers  mod I using LRAD.  Locomotion  Supervision 150 feet using LRAD.  Communication  Supervision  Cognition     Pain  < 3  Safety/Judgment  Supervision   Therapy Plan: PT Intensity: Minimum of 1-2 x/day ,45 to 90 minutes PT Frequency: 5 out of 7 days PT Duration Estimated Length of Stay: 10-14 days OT Intensity: Minimum of 1-2 x/day, 45 to 90 minutes OT Frequency: 5 out of 7 days OT Duration/Estimated Length of Stay: 10-12 days SLP Intensity: Minumum of 1-2 x/day, 30 to 90 minutes SLP Frequency: 3 to 5 out of 7 days SLP Duration/Estimated Length of Stay: 10-14 days     Team Interventions: Nursing Interventions Patient/Family Education, Bowel Management, Disease Management/Prevention, Cognitive Remediation/Compensation, Skin Care/Wound Management, Medication Management, Discharge Planning  PT interventions Ambulation/gait training, Discharge planning, Functional mobility training, Psychosocial support, Therapeutic Activities, Visual/perceptual remediation/compensation, Balance/vestibular training, Disease management/prevention, Neuromuscular re-education, Skin care/wound management, Therapeutic Exercise, Wheelchair propulsion/positioning, DME/adaptive equipment instruction, Pain management, Splinting/orthotics, UE/LE Strength taining/ROM, Community reintegration, Technical sales engineer stimulation, Barrister's clerk education, IT trainer, UE/LE Coordination activities  OT Interventions Training and development officer, Discharge planning, Pain management, Self Care/advanced ADL retraining, Therapeutic Activities, UE/LE Coordination activities, Cognitive remediation/compensation, Disease mangement/prevention, Functional mobility training, Patient/family education, Skin care/wound managment, Therapeutic Exercise, Community reintegration, DME/adaptive equipment instruction, Neuromuscular re-education, Psychosocial support, Splinting/orthotics, UE/LE Strength taining/ROM  SLP Interventions Dysphagia/aspiration precaution training, Speech/Language facilitation, Therapeutic Activities, Environmental controls, Cueing hierarchy, Patient/family education, Functional tasks  TR Interventions    SW/CM Interventions     Barriers to Discharge MD  Medical stability  Nursing      PT Home environment access/layout 2 STE home without rails and a flight of stairs to [employer] and work area.  OT      SLP      SW       Team Discharge Planning: Destination: PT-Home ,OT- Home , SLP-Home Projected Follow-up: PT-Outpatient PT, OT-  Outpatient OT, SLP-Outpatient SLP Projected  Equipment Needs: PT-To be determined, OT- Other (comment), SLP-None recommended by SLP Equipment Details: PT-TBD based on patient's progress with therapy, patient has no DME at home, OT-shower chair Patient/family involved in discharge planning: PT- Patient,  OT-Patient, SLP-Patient  MD ELOS: 10-14 days. Medical Rehab Prognosis:  Good Assessment: 69 year old right-handed female with history of hypertension.  Presented 06/14/2019 with right-sided weakness and dysarthria.  Cranial CT unremarkable for acute intracranial process.  Patient did receive TPA.  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  Moderate proximal PCA stenosis bilaterally.  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  Echocardiogram with ejection fraction of 65% without emboli.  Neurology follow-up maintained on aspirin and Plavix for CVA prophylaxis x3 months then Plavix alone.  Tolerating mechanical soft diet.  Maintain on Cardene drip initially for blood pressure control.  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 Patient with resulting functional deficits with mobility, transfers, swallowing, self-care.  Will set goals for Mod I with PT/OT and Supervision with SLP.   Due to the current state of emergency, patients may not be receiving their 3-hours of Medicare-mandated therapy.  See Team Conference Notes for weekly updates to the plan of care

## 2019-06-20 NOTE — TOC Transition Note (Signed)
Transition of Care [facility]) - CM/SW Discharge Note   Patient Details  Name: JOHNNETTE AZZARELLI MRN: [id number] Date of Birth: [birth year]  Transition of Care [facility]) CM/SW Contact:  Pollie Friar, RN Phone Number: 06/20/2019, 12:34 PM   Clinical Narrative:    Pt discharging to CIR today. CM signing off.    Final next level of care: IP Rehab Facility Barriers to Discharge: No Barriers Identified   Patient Goals and CMS Choice   CMS Medicare.gov Compare Post Acute Care list provided to:: Patient Choice offered to / list presented to : Patient  Discharge Placement                       Discharge Plan and Services                                     Social Determinants of Health (SDOH) Interventions     Readmission Risk Interventions No flowsheet data found.

## 2019-06-20 NOTE — Progress Notes (Signed)
OT Cancellation Note  Patient Details Name: Megan Martinez MRN: [id number] DOB: [birth year]   Cancelled Treatment:    Reason Eval/Treat Not Completed: Other (comment). Pt declined, preparing to go to CIR today.  Emmit Alexanders [facility] 06/20/2019, 2:03 PM

## 2019-06-20 NOTE — Progress Notes (Signed)
Inpatient Rehabilitation Admissions Coordinator  CIR bed is available today for admission and I will make the arrangements. Dr. Erlinda Hong and patient made aware.  Danne Baxter, RN, MSN Rehab Admissions Coordinator [phone] 06/20/2019 10:27 AM

## 2019-06-20 NOTE — Progress Notes (Signed)
Patient admitted to IP rehab during shift. Patient informed of safety policy and visitor policy. MASD noted to groin area. Adria Devon, LPN

## 2019-06-20 NOTE — H&P (Addendum)
Physical Medicine and Rehabilitation Admission H&P     HPI: Megan Martinez is a 69 year old right-handed female with history of hypertension.  History taken from chart review and patient. 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  Presented 06/14/2019 with right-sided weakness and dysarthria.  Cranial CT unremarkable for acute intracranial process.  Patient did receive TPA.  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  Moderate proximal PCA stenosis bilaterally.  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  Echocardiogram with ejection fraction of 65% without emboli.  Admission chemistries with potassium 3.0, urine drug screen negative.  Neurology follow-up maintained on aspirin and Plavix for CVA prophylaxis x3 months then Plavix alone.  Subcutaneous Lovenox for DVT prophylaxis.  Tolerating mechanical soft diet.  Maintain on Cardene drip initially for blood pressure control.  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  Therapy evaluations completed and patient was admitted for a comprehensive rehab program.  Review of Systems  Constitutional: Negative for chills and fever.  HENT: Negative for hearing loss.   Eyes: Negative for blurred vision and double vision.  Respiratory: Negative for cough and shortness of breath.   Cardiovascular: Negative for chest pain, palpitations and leg swelling.  Gastrointestinal: Negative for constipation, heartburn, nausea and vomiting.  Genitourinary: Negative for dysuria, flank pain and hematuria.  Musculoskeletal: Positive for myalgias.  Skin: Negative for rash.    Neurological: Positive for speech change, focal weakness and weakness. Negative for sensory change.  All other systems reviewed and are negative.  Past Medical History:  Diagnosis Date  . Allergy   . Arthritis   . Hypertension   . Thyroid disease    Past Surgical History:  Procedure Laterality Date  . ABDOMINAL HYSTERECTOMY    . APPENDECTOMY    . CATARACT EXTRACTION    . CHOLECYSTECTOMY    . TUBAL LIGATION     History reviewed. No pertinent family history. Social History:  reports that she quit smoking about 51 years ago. She has never used smokeless tobacco. She reports current alcohol use. She reports that she does not use drugs. Only smoked x 1 year, age 31 because "was cool"; lives alone near [place] with Boyfriend living next door and daughter in [place] to come help daily- daughter trained as Marine scientist, but not working as one.    Allergies:  Allergies  Allergen Reactions  . Latex Itching and Rash  . Statins Other (See Comments)    Myalgias, "sick"   . Penicillin G     Did it involve swelling of the face/tongue/throat, SOB, or low BP? No Did it involve sudden or severe rash/hives, skin peeling, or any reaction on the inside of your mouth or nose? Yes Did you need to seek medical attention at a hospital or doctor's office? N/A When did it last happen?Child If all above answers are "NO", may proceed with cephalosporin use.  Marland Kitchen Amoxicillin Rash    Did it involve swelling of the face/tongue/throat, SOB, or low BP? No Did it involve sudden or severe rash/hives, skin peeling, or any reaction on the inside of your mouth or nose? Yes Did you need to seek medical attention  at a hospital or doctor's office? N/A When did it last happen? Child If all above answers are "NO", may proceed with cephalosporin use.  . Codeine Nausea Only   Medications Prior to Admission  Medication Sig Dispense Refill  . amLODipine (NORVASC) 10 MG tablet Take 1 tablet by mouth daily.    .  fluticasone (FLONASE) 50 MCG/ACT nasal spray Place 2 sprays into both nostrils daily. 16 g 6  . levothyroxine (SYNTHROID) 137 MCG tablet TAKE 1 TABLET BY MOUTH DAILY (Patient taking differently: Take 137 mcg by mouth daily. ) 30 tablet 3  . Candesartan Cilexetil-HCTZ 32-25 MG TABS TAKE 1 TABLET BY MOUTH DAILY (Patient not taking: Reported on 06/14/2019) 90 each 1  . HYDROcodone-homatropine (HYCODAN) 5-1.5 MG/5ML syrup Take 5 mLs by mouth every 8 (eight) hours as needed for cough. (Patient not taking: Reported on 06/14/2019) 120 mL 0  . levocetirizine (XYZAL) 5 MG tablet TAKE 1 TABLET(5 MG) BY MOUTH EVERY EVENING (Patient not taking: Reported on 06/14/2019) 90 tablet 1  . predniSONE (STERAPRED UNI-PAK 21 TAB) 10 MG (21) TBPK tablet Take as directed on packaging. (Patient not taking: Reported on 06/14/2019) 21 tablet 0  . triamcinolone cream (KENALOG) 0.1 % Apply 1 application topically 2 (two) times daily as needed. (Patient not taking: Reported on 01/03/2019) 450 g 0    Drug Regimen Review Drug regimen was reviewed and remains appropriate with no significant issues identified  Home: Home Living Family/patient expects to be discharged to:: Private residence Living Arrangements: Alone Available Help at Discharge: Family, Friend(s)(boyfriend, Richard and daughter, [name] will provide 24/7) Type of Home: House Home Access: Stairs to enter [employer] of Steps: 2 Home Layout: Two level, Laundry or work area in basement [employer] Shower/Tub: Careers adviser unit, Multimedia programmer: Associate Professor Accessibility: Yes Home Equipment: None Additional Comments: boyfriend of 11 years lives next door; [name] is Radiation protection practitioner and unemployed  Lives With: Alone   Functional History: Prior Function Level of Independence: Independent  Functional Status:  Mobility: Bed Mobility Overal bed mobility: Needs Assistance Bed Mobility: Supine to Sit, Rolling, Sidelying to Sit Rolling: Min  assist(to the R side reaching with L.) Sidelying to sit: Mod assist(assistance to elevate trunk into a seated position.) Supine to sit: Mod assist, HOB elevated Sit to supine: Min assist General bed mobility comments: Pt attempted to move OOB on L side but unable to manage due to flaccid R UE.  Pt performed better moving to L side of bed.  She still required moderate assistance to elevate trunk into a seated position. Transfers Overall transfer level: Needs assistance Equipment used: Rolling walker (2 wheeled) Transfers: Sit to/from Stand Sit to Stand: Mod assist Stand pivot transfers: Mod assist, +2 physical assistance General transfer comment: Mod assistance to boost into standing and assist RUE to RW with hand over hand placement.  She rises to standing with min but because she require assistance with RUE she remains moderate. Ambulation/Gait Ambulation/Gait assistance: Mod assist, Max assist Gait Distance (Feet): 60 Feet Assistive device: Rolling walker (2 wheeled) Gait Pattern/deviations: Decreased dorsiflexion - right, Decreased step length - left, Narrow base of support, Decreased stance time - right General Gait Details: Pt performed gt training with cues for sequencing and emphasis on R knee extension.  Noted buckling in R stance phase.  Performed 8 ft of gt training and sever buckling noted.  Pt sat down and PTA ace wrapped RLE into dorsiflexion.  Pt was able to progress steps better but still presents with  buckling when fatigue sets in. Hand over hand on RUE to maintain R hand and wrist position. Gait velocity: decreased    ADL: ADL Overall ADL's : Needs assistance/impaired Eating/Feeding: Minimal assistance Grooming: Minimal assistance, Sitting Upper Body Bathing: Moderate assistance Lower Body Bathing: Moderate assistance Upper Body Dressing : Minimal assistance Lower Body Dressing: Moderate assistance Lower Body Dressing Details (indicate cue type and reason): requires OT  to help thread socks and knee flexion for RLE to don sock. pt able to pull sock on with L UE Toilet Transfer: +2 for physical assistance, Moderate assistance General ADL Comments: OOB to chair this session with noted RLE bucklet required blocking  Cognition: Cognition Overall Cognitive Status: Within Functional Limits for tasks assessed Orientation Level: Oriented X4 Cognition Arousal/Alertness: Awake/alert Behavior During Therapy: WFL for tasks assessed/performed Overall Cognitive Status: Within Functional Limits for tasks assessed  Physical Exam: Blood pressure (!) 135/55, pulse 61, temperature 98.2 F (36.8 C), temperature source Oral, resp. rate 18, height 5\' 4"  (1.626 m), weight 76.7 kg, SpO2 97 %. Physical Exam  Nursing note and vitals reviewed. Constitutional: She appears well-developed and well-nourished.  HENT:  No facial sensation changes and no facial droop and tongue midline   Eyes: EOM are normal. Right eye exhibits no discharge. Left eye exhibits no discharge.  Neck: No tracheal deviation present. No thyromegaly present.  Cardiovascular:  RRR  Respiratory: Effort normal. No respiratory distress.  CTA B/L no W/R/R  GI: Soft. She exhibits no distension.  Soft, NT, ND, (+)BS  Musculoskeletal:     Cervical back: Normal range of motion and neck supple.     Comments: No edema or tenderness in extremities  Neurological: She is alert.  Dysarthria She makes eye contact with examiner and follows simple commands.   Appears to have fair insight and awareness. Motor: RUE: 0/5 proximal and distal RLE: Hip flexion, knee extension 4/5, ankle dorsiflexion 0/5 LUE: 5/5 proximal distal LLE: 5/5 proximal distal Sensation intact light touch  Skin: Skin is dry.  Psychiatric: She has a normal mood and affect. Her behavior is normal.    Results for orders placed or performed during the hospital encounter of 06/14/19 (from the past 48 hour(s))  Basic metabolic panel     Status:  Abnormal   Collection Time: 06/19/19  2:50 AM  Result Value Ref Range   Sodium 142 135 - 145 mmol/L   Potassium 3.8 3.5 - 5.1 mmol/L   Chloride 107 98 - 111 mmol/L   CO2 24 22 - 32 mmol/L   Glucose, Bld 100 (H) 70 - 99 mg/dL   BUN 13 8 - 23 mg/dL   Creatinine, Ser 0.88 0.44 - 1.00 mg/dL   Calcium 9.0 8.9 - 10.3 mg/dL   GFR calc non Af Amer >60 >60 mL/min   GFR calc Af Amer >60 >60 mL/min   Anion gap 11 5 - 15    Comment: Performed at [facility], [place] [address], [place] [address]  CBC     Status: None   Collection Time: 06/19/19  2:50 AM  Result Value Ref Range   WBC 7.0 4.0 - 10.5 K/uL   RBC 4.48 3.87 - 5.11 MIL/uL   Hemoglobin 13.3 12.0 - 15.0 g/dL   HCT 38.8 36.0 - 46.0 %   MCV 86.6 80.0 - 100.0 fL   MCH 29.7 26.0 - 34.0 pg   MCHC 34.3 30.0 - 36.0 g/dL   RDW 13.2 11.5 - 15.5 %   Platelets  237 150 - 400 K/uL   nRBC 0.0 0.0 - 0.2 %    Comment: Performed at [facility], [place] [address] [place], [place] [id number]  Basic metabolic panel     Status: None   Collection Time: 06/20/19  4:17 AM  Result Value Ref Range   Sodium 141 135 - 145 mmol/L   Potassium 3.7 3.5 - 5.1 mmol/L   Chloride 107 98 - 111 mmol/L   CO2 25 22 - 32 mmol/L   Glucose, Bld 99 70 - 99 mg/dL   BUN 15 8 - 23 mg/dL   Creatinine, Ser 0.93 0.44 - 1.00 mg/dL   Calcium 9.1 8.9 - 10.3 mg/dL   GFR calc non Af Amer >60 >60 mL/min   GFR calc Af Amer >60 >60 mL/min   Anion gap 9 5 - 15    Comment: Performed at [facility], [place] [address] [place] [address]  CBC     Status: None   Collection Time: 06/20/19  4:17 AM  Result Value Ref Range   WBC 6.0 4.0 - 10.5 K/uL   RBC 4.68 3.87 - 5.11 MIL/uL   Hemoglobin 13.6 12.0 - 15.0 g/dL   HCT 40.9 36.0 - 46.0 %   MCV 87.4 80.0 - 100.0 fL   MCH 29.1 26.0 - 34.0 pg   MCHC 33.3 30.0 - 36.0 g/dL   RDW 13.4 11.5 - 15.5 %   Platelets 252 150 - 400 K/uL   nRBC 0.0 0.0 - 0.2 %    Comment: Performed at [facility], [place]  [address], [place], [place] [address]   No results found.  Medical Problem List and Plan: 1.  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  PLAN BASILAR ARTERY STENTING AFTER REHAB PROGRAM completed per interventional radiology  -patient may  shower  -ELOS/Goals: 13-17 days/Goals Supervision/Min A  Admit to CIR 2.  Antithrombotics: -DVT/anticoagulation: Lovenox  -antiplatelet therapy: Aspirin 325 mg daily, Plavix 75 mg daily x3 months then Plavix alone 3. Pain Management: Tylenol as needed 4. Mood: Provide emotional support  -antipsychotic agents: N/A 5. Neuropsych: This patient is capable of making decisions on her own behalf. 6. Skin/Wound Care: Routine skin checks 7. Fluids/Electrolytes/Nutrition: Routine in and outs CMP ordered for tomorrow 8.  Essential hypertension: Patient on Norvasc 10 mg daily and cadestran-HCTZ 32-25 mg daily prior to admission.  Resume as needed  Monitor with increased mobility 9.  Hypothyroidism.  Continue Synthroid 10.  Hyperlipidemia: Continue Lipitor 11.  Post stroke dysphagia  D3 thins, advance diet as tolerated  Cathlyn Parsons, PA-C 06/20/2019   I have personally performed a face to face diagnostic evaluation, including, but not limited to relevant history and physical exam findings, of this patient and developed relevant assessment and plan.  Additionally, I have reviewed and concur with the physician assistant's documentation above.  Delice Lesch, MD, ABPMR  The patient's status has not changed. The original post admission physician evaluation remains appropriate, and any changes from the pre-admission screening or documentation from the acute chart are noted above.   Delice Lesch, MD, ABPMR

## 2019-06-21 ENCOUNTER — Inpatient Hospital Stay (HOSPITAL_COMMUNITY): Payer: PPO

## 2019-06-21 ENCOUNTER — Inpatient Hospital Stay (HOSPITAL_COMMUNITY): Payer: PPO | Admitting: Speech Pathology

## 2019-06-21 ENCOUNTER — Inpatient Hospital Stay (HOSPITAL_COMMUNITY): Payer: PPO | Admitting: Occupational Therapy

## 2019-06-21 DIAGNOSIS — I10 Essential (primary) hypertension: Secondary | ICD-10-CM

## 2019-06-21 DIAGNOSIS — I69391 Dysphagia following cerebral infarction: Secondary | ICD-10-CM

## 2019-06-21 DIAGNOSIS — R7401 Elevation of levels of liver transaminase levels: Secondary | ICD-10-CM

## 2019-06-21 DIAGNOSIS — E8809 Other disorders of plasma-protein metabolism, not elsewhere classified: Secondary | ICD-10-CM

## 2019-06-21 DIAGNOSIS — I6350 Cerebral infarction due to unspecified occlusion or stenosis of unspecified cerebral artery: Secondary | ICD-10-CM

## 2019-06-21 DIAGNOSIS — E46 Unspecified protein-calorie malnutrition: Secondary | ICD-10-CM

## 2019-06-21 DIAGNOSIS — R0989 Other specified symptoms and signs involving the circulatory and respiratory systems: Secondary | ICD-10-CM

## 2019-06-21 LAB — COMPREHENSIVE METABOLIC PANEL
ALT: 53 U/L — ABNORMAL HIGH (ref 0–44)
AST: 50 U/L — ABNORMAL HIGH (ref 15–41)
Albumin: 3.4 g/dL — ABNORMAL LOW (ref 3.5–5.0)
Alkaline Phosphatase: 30 U/L — ABNORMAL LOW (ref 38–126)
Anion gap: 8 (ref 5–15)
BUN: 15 mg/dL (ref 8–23)
CO2: 25 mmol/L (ref 22–32)
Calcium: 9.1 mg/dL (ref 8.9–10.3)
Chloride: 110 mmol/L (ref 98–111)
Creatinine, Ser: 0.89 mg/dL (ref 0.44–1.00)
GFR calc Af Amer: >60 mL/min (ref >60)
GFR calc non Af Amer: >60 mL/min (ref >60)
Glucose, Bld: 105 mg/dL — ABNORMAL HIGH (ref 70–99)
Potassium: 3.7 mmol/L (ref 3.5–5.1)
Sodium: 143 mmol/L (ref 135–145)
Total Bilirubin: 0.6 mg/dL (ref 0.3–1.2)
Total Protein: 6.2 g/dL — ABNORMAL LOW (ref 6.5–8.1)

## 2019-06-21 LAB — CBC WITH DIFFERENTIAL/PLATELET
Abs Immature Granulocytes: 0.02 10*3/uL (ref 0.00–0.07)
Basophils Absolute: 0.1 10*3/uL (ref 0.0–0.1)
Basophils Relative: 1 %
Eosinophils Absolute: 0.2 10*3/uL (ref 0.0–0.5)
Eosinophils Relative: 3 %
HCT: 41.2 % (ref 36.0–46.0)
Hemoglobin: 13.9 g/dL (ref 12.0–15.0)
Immature Granulocytes: 0 %
Lymphocytes Relative: 32 %
Lymphs Abs: 2.2 10*3/uL (ref 0.7–4.0)
MCH: 29.1 pg (ref 26.0–34.0)
MCHC: 33.7 g/dL (ref 30.0–36.0)
MCV: 86.2 fL (ref 80.0–100.0)
Monocytes Absolute: 0.5 10*3/uL (ref 0.1–1.0)
Monocytes Relative: 8 %
Neutro Abs: 3.9 10*3/uL (ref 1.7–7.7)
Neutrophils Relative %: 56 %
Platelets: 254 10*3/uL (ref 150–400)
RBC: 4.78 MIL/uL (ref 3.87–5.11)
RDW: 13.4 % (ref 11.5–15.5)
WBC: 6.9 10*3/uL (ref 4.0–10.5)
nRBC: 0 % (ref 0.0–0.2)

## 2019-06-21 MED ORDER — PRO-STAT SUGAR FREE PO LIQD
30.0000 mL | Freq: Two times a day (BID) | ORAL | Status: DC
  Administered 2019-06-22 – 2019-07-02 (×3): 30 mL via ORAL
  Filled 2019-06-21 (×15): qty 30

## 2019-06-21 NOTE — Evaluation (Signed)
Speech Language Pathology Assessment and Plan  Patient Details  Name: Megan Martinez MRN: [id number] Date of Birth: [birth year]  SLP Diagnosis: Dysarthria;Dysphagia  Rehab Potential: Excellent ELOS: 10-14 days    Today's Date: 06/21/2019 SLP Individual Time: 1324-4010 SLP Individual Time Calculation (min): 60 min   Problem List:  Patient Active Problem List   Diagnosis Date Noted  . Transaminitis   . Hypoalbuminemia due to protein-calorie malnutrition ([place])   . Labile blood pressure   . Left pontine cerebrovascular accident ([place]) 06/20/2019  . Acute ischemic stroke ([place])   . Right hemiparesis ([place])   . Dysphagia, post-stroke   . Basilar artery stenosis with infarction [facility]) s/p tPA 06/19/2019  . Stroke (cerebrum) ([place]) L pontine d/t BA stenosis 06/14/2019  . Subcutaneous nodule of left foot 01/03/2019  . Atopic dermatitis 10/18/2018  . Seasonal allergies 08/09/2018  . 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 Osteoarthritis of carpometacarpal (CMC) joint of thumb 11/07/2017  . Acquired hypothyroidism 08/04/2015  . Essential hypertension 08/04/2015  . GERD without esophagitis 08/04/2015  . Mixed hyperlipidemia 08/04/2015  . Osteopenia 08/04/2015  . Primary insomnia 08/04/2015  . Pure hypertriglyceridemia 08/04/2015   Past Medical History:  Past Medical History:  Diagnosis Date  . Allergy   . Arthritis   . Hypertension   . Thyroid disease    Past Surgical History:  Past Surgical History:  Procedure Laterality Date  . ABDOMINAL HYSTERECTOMY    . APPENDECTOMY    . CATARACT EXTRACTION    . CHOLECYSTECTOMY    . TUBAL LIGATION      Assessment / Plan / Recommendation Clinical Impression Patient is a 69 year old right-handed female with history of hypertension.  History taken from chart review and patient. 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  Presented  06/14/2019 with right-sided weakness and dysarthria. Cranial CT unremarkable for acute intracranial process.  Patient did receive TPA.  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  Moderate proximal PCA stenosis bilaterally.  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  Echocardiogram with ejection fraction of 65% without emboli. Admission chemistries with potassium 3.0, urine drug screen negative.  Neurology follow-up maintained on aspirin and Plavix for CVA prophylaxis x3 months then Plavix alone.  Subcutaneous Lovenox for DVT prophylaxis. Tolerating mechanical soft diet.  Maintain on Cardene drip initially for blood pressure control. 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  Therapy evaluations completed and patient was admitted for a comprehensive rehab program 06/20/19.  Patient demonstrates a moderate dysarthria characterized by decreased coordination of oral-motor musculature and respiratory timing upon verbal expression resulting in decreased speech intelligibility at the phrase level.  Patient's cognitive functioning, word-finding and auditory comprehension appears [facility] for all tasks assessed.  Patient's oral-motor weakness and impaired coordination also results in prolonged mastication and mild oral residue with solid textures. Patient consumed trials of thin liquids both with and without the chin tuck without overt s/s of aspiration. However, patient with silent aspiration on most recent MBS on 1/22, therefore, patient may need repeat MBS to assess ongoing need for compensatory strategy. Recommend patient continue current diet of Dys. 3 textures  with thin liquids with a  chin tuck and a straw to help facilitate use of the chin tuck.  Patient would benefit from skilled SLP intervention to maximize her speech intelligibility and swallowing function prior to discharge.    Skilled Therapeutic Interventions          Administered a cognitive-linguistic evaluation and BSE, please see above for details.   SLP Assessment  Patient will need skilled Speech Lanaguage Pathology Services during CIR admission    Recommendations  SLP Diet Recommendations: Dysphagia 3 (Mech soft);Thin Liquid Administration via: Straw;Cup Medication Administration: Crushed with puree Supervision: Patient able to self feed;Intermittent supervision to cue for compensatory strategies Compensations: Chin tuck;Use straw to facilitate chin tuck;Minimize environmental distractions;Slow rate;Small sips/bites Postural Changes and/or Swallow Maneuvers: Seated upright 90 degrees;Chin tuck;Upright 30-60 min after meal Oral Care Recommendations: Oral care BID Patient destination: Home Follow up Recommendations: Outpatient SLP Equipment Recommended: None recommended by SLP    SLP Frequency 3 to 5 out of 7 days   SLP Duration  SLP Intensity  SLP Treatment/Interventions 10-14 days  Minumum of 1-2 x/day, 30 to 90 minutes  Dysphagia/aspiration precaution training;Speech/Language facilitation;Therapeutic Activities;Environmental controls;Cueing hierarchy;Patient/family education;Functional tasks    Pain No/Denies   SLP Evaluation Cognition Overall Cognitive Status: Within Functional Limits for tasks assessed Arousal/Alertness: Awake/alert Orientation Level: Oriented X4 Memory: Appears intact Awareness: Appears intact Problem Solving: Appears intact Safety/Judgment: Appears intact  Comprehension Auditory Comprehension Overall Auditory Comprehension: Appears within functional limits for tasks assessed Visual Recognition/Discrimination Discrimination: Not tested Reading Comprehension Reading  Status: Not tested Expression Expression Primary Mode of Expression: Verbal Verbal Expression Overall Verbal Expression: Appears within functional limits for tasks assessed Written Expression Dominant Hand: Right Written Expression: Not tested Oral Motor Oral Motor/Sensory Function Overall Oral Motor/Sensory Function: Mild impairment Facial ROM: Reduced right;Suspected CN VII (facial) dysfunction Facial Symmetry: Abnormal symmetry right;Suspected CN VII (facial) dysfunction Facial Strength: Suspected CN VII (facial) dysfunction Facial Sensation: Within Functional Limits Lingual ROM: Suspected CN XII (hypoglossal) dysfunction Lingual Symmetry: Suspected CN XII (hypoglossal) dysfunction Lingual Strength: Suspected CN XII (hypoglossal) dysfunction Lingual Sensation: Suspected CN VII (facial) dysfunction-anterior 2/3 tongue Mandible: Within Functional Limits Motor Speech Overall Motor Speech: Impaired Respiration: Within functional limits Phonation: Normal Resonance: Hypernasality Articulation: Impaired Level of Impairment: Word Intelligibility: Intelligibility reduced Word: 75-100% accurate Phrase: 75-100% accurate Sentence: 50-74% accurate Conversation: 50-74% accurate Motor Planning: Witnin functional limits Effective Techniques: Slow rate;Over-articulate;Pause   Bedside Swallowing Assessment General Date of Onset: 06/14/19 Previous Swallow Assessment: MBS 1/22: Recommended Dys. 3 textures with thin liquids with chin tuck Diet Prior to this Study: Dysphagia 3 (soft);Thin liquids Temperature Spikes Noted: No Respiratory Status: Room air History of Recent Intubation: No Behavior/Cognition: Alert;Cooperative Oral Cavity - Dentition: Dentures, top Self-Feeding Abilities: Able to feed self;Needs set up Vision: Functional for self-feeding Patient Positioning: Upright in bed Baseline Vocal Quality: Normal Volitional Cough: Strong Volitional Swallow: Able to elicit  Ice  Chips Ice chips: Not tested Thin Liquid Thin Liquid: Impaired Presentation: Cup;Self Fed;Straw Oral Phase Functional Implications: Right anterior spillage Nectar Thick Nectar Thick Liquid: Not tested Honey Thick Honey Thick Liquid: Not tested Puree Puree: Impaired Presentation: Self Fed Oral Phase Functional Implications: Oral residue Solid Solid: Impaired Presentation: Self Fed Oral Phase Functional Implications: Oral residue;Impaired mastication BSE Assessment Risk for Aspiration Impact on safety and function: Mild aspiration risk  Short Term Goals: Week 1: SLP Short Term Goal 1 (Week 1): Patient will consume trials of regular textures with efficient mastication and complete oral clearance without overt  s/s of aspiration with supervision level verbal cues over 2 sessions prior to upgrade. SLP Short Term Goal 2 (Week 1): Patient will consume current diet with minimal overt s/s of aspiration with Mod I for use of swallowing compensatory strategies. SLP Short Term Goal 3 (Week 1): Patient will utilize speech intelligibility strategies at the phrase level with Min A verbal cues to achieve ~75% intelligibility.  Refer to Care Plan for Long Term Goals  Recommendations for other services: None   Discharge Criteria: Patient will be discharged from SLP if patient refuses treatment 3 consecutive times without medical reason, if treatment goals not met, if there is a change in medical status, if patient makes no progress towards goals or if patient is discharged from hospital.  The above assessment, treatment plan, treatment alternatives and goals were discussed and mutually agreed upon: by patient  Elgie Landino 06/21/2019, 12:42 PM

## 2019-06-21 NOTE — Evaluation (Signed)
Occupational Therapy Assessment and Plan  Patient Details  Name: Megan Martinez MRN: [id number] Date of Birth: [birth year]  OT Diagnosis: hemiplegia affecting dominant side and muscle weakness (generalized) Rehab Potential: Rehab Potential (ACUTE ONLY): Good ELOS: 10-12 days   Today's Date: 06/21/2019 OT Individual Time: 1100-1200 OT Individual Time Calculation (min): 60 min     Problem List:  Patient Active Problem List   Diagnosis Date Noted  . Transaminitis   . Hypoalbuminemia due to protein-calorie malnutrition ([place])   . Labile blood pressure   . Left pontine cerebrovascular accident ([place]) 06/20/2019  . Acute ischemic stroke ([place])   . Right hemiparesis ([place])   . Dysphagia, post-stroke   . Basilar artery stenosis with infarction [facility]) s/p tPA 06/19/2019  . Stroke (cerebrum) ([place]) L pontine d/t BA stenosis 06/14/2019  . Subcutaneous nodule of left foot 01/03/2019  . Atopic dermatitis 10/18/2018  . Seasonal allergies 08/09/2018  . 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 Osteoarthritis of carpometacarpal (CMC) joint of thumb 11/07/2017  . Acquired hypothyroidism 08/04/2015  . Essential hypertension 08/04/2015  . GERD without esophagitis 08/04/2015  . Mixed hyperlipidemia 08/04/2015  . Osteopenia 08/04/2015  . Primary insomnia 08/04/2015  . Pure hypertriglyceridemia 08/04/2015    Past Medical History:  Past Medical History:  Diagnosis Date  . Allergy   . Arthritis   . Hypertension   . Thyroid disease    Past Surgical History:  Past Surgical History:  Procedure Laterality Date  . ABDOMINAL HYSTERECTOMY    . APPENDECTOMY    . CATARACT EXTRACTION    . CHOLECYSTECTOMY    . TUBAL LIGATION      Assessment & Plan Clinical Impression: Patient is a 69 y.o. year old female right-handed female with history of hypertension.  History taken from chart review and patient. 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  Presented 06/14/2019 with right-sided weakness and dysarthria.  Cranial CT unremarkable for acute intracranial process.  Patient did receive TPA.  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  Moderate proximal PCA stenosis bilaterally.  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  Echocardiogram with ejection fraction of 65% without emboli.  Admission chemistries with potassium 3.0, urine drug screen negative.  Neurology follow-up maintained on aspirin and Plavix for CVA prophylaxis x3 months then Plavix alone.  Subcutaneous Lovenox for DVT prophylaxis.  Tolerating mechanical soft diet.  Maintain on Cardene drip initially for blood pressure control.  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 Patient transferred to CIR on 06/20/2019 .    Patient currently requires mod with basic self-care skills and basic mobility secondary to muscle weakness, impaired timing and sequencing, abnormal tone, unbalanced muscle activation, decreased coordination and decreased motor planning and decreased sitting balance, decreased standing balance, decreased postural control, hemiplegia and decreased balance strategies.  Prior to hospitalization, patient could complete ADL with independent .  Patient will benefit from skilled intervention to decrease level of assist with basic self-care skills and increase independence with basic self-care skills prior to discharge home with care partner.  Anticipate patient will require intermittent supervision and follow up home health.  OT - End  of Session Activity Tolerance: Tolerates 30+ min activity without fatigue Endurance  Deficit: No OT Assessment Rehab Potential (ACUTE ONLY): Good OT Patient demonstrates impairments in the following area(s): Balance;Perception;Safety;Edema;Endurance;Motor OT Basic ADL's Functional Problem(s): Grooming;Bathing;Dressing;Toileting OT Transfers Functional Problem(s): Toilet;Tub/Shower OT Additional Impairment(s): Fuctional Use of Upper Extremity OT Plan OT Intensity: Minimum of 1-2 x/day, 45 to 90 minutes OT Frequency: 5 out of 7 days OT Duration/Estimated Length of Stay: 10-12 days OT Treatment/Interventions: Balance/vestibular training;Discharge planning;Pain management;Self Care/advanced ADL retraining;Therapeutic Activities;UE/LE Coordination activities;Cognitive remediation/compensation;Disease mangement/prevention;Functional mobility training;Patient/family education;Skin care/wound managment;Therapeutic Exercise;Community reintegration;DME/adaptive equipment instruction;Neuromuscular re-education;Psychosocial support;Splinting/orthotics;UE/LE Strength taining/ROM OT Self Feeding Anticipated Outcome(s): n/a OT Basic Self-Care Anticipated Outcome(s): supervision OT Toileting Anticipated Outcome(s): mod I OT Bathroom Transfers Anticipated Outcome(s): mod  I (only sit to stands) anticipate pt will walk into bathroom with supervision OT Recommendation Patient destination: Home Follow Up Recommendations: Outpatient OT Equipment Recommended: Other (comment) Equipment Details: shower chair   Skilled Therapeutic Intervention OT eval initiated with Ot purpose, role and goals discussed with pt.  1:1 Self care retraining at shower level. Pt performed sit to stands with min A and functional transfers with min to mod A. Pt performed toilet transfer and shower stall transfer without threshold with min-mod A. Pt able to bathe remaining in seated position with min A. Dressed sit to stand in the w/c with focus on hip and knee control with standing with equal weight bearing. Facilitation  and tactile cues to avoid knee hyperextension; able to achieve but difficulty maintaining. Educated and demonstrate hemi dressing techniques. Pt with button up shirt today and bra- difficulty with fastening clothing with one handed today. Pt eager to perform exercises for UE in between therapies. Educated and use the bedside table to support right UE - performed shoulder shrugs and scapular retraction, shoulder abduction and slight elbow flexion with hand on paper to help guide on table top. Pt also demonstrated PROM exercises taught on acute she has also been performing.    OT Evaluation Precautions/Restrictions  Precautions Precautions: Fall Precaution Comments: right hemiparesis Restrictions Weight Bearing Restrictions: No Other Position/Activity Restrictions: RLE ace wrapped into dorsiflexion. General Chart Reviewed: Yes Family/Caregiver Present: No Vital Signs Therapy Vitals Temp: 97.9 F (36.6 C) Temp Source: Oral Pulse Rate: (!) 52 Resp: 20 BP: (!) 142/70 Patient Position (if appropriate): Sitting Oxygen Therapy SpO2: 96 % O2 Device: Room Air Pain  no c/o pain in session  Home Living/Prior [place] expects to be discharged to:: Private residence Living Arrangements: Spouse/significant other, Children Available Help at Discharge: Family, Friend(s), Available 24 hours/day Type of Home: House Home Access: Stairs to enter [employer] of Steps: 2 small steps Entrance Stairs-Rails: None Home Layout: Two level, Laundry or work area in basement Alternate Therapist, sports of Steps: 1 flight with one rail Bathroom Shower/Tub: Tub/shower unit, Walk-in shower Additional Comments: boyfriend of 11 years lives next door; [place], daughter, is Radiation protection practitioner and unemployed  Lives With: Alone Prior Function Level of Independence: Independent with basic ADLs, Independent with gait, Independent with homemaking with ambulation, Independent with  transfers  Able to Take Stairs?: Reciprically Driving: Yes Vocation: Part time employment ADL ADL Eating: Minimal assistance Grooming: Minimal assistance Upper Body Bathing: Minimal assistance Where Assessed-Upper Body Bathing: Shower Lower Body Bathing: Moderate assistance Where Assessed-Lower Body Bathing: Shower Upper Body Dressing: Moderate assistance Lower Body Dressing: Moderate assistance Where Assessed-Lower Body Dressing: Wheelchair Toileting: Moderate assistance Toilet Transfer: Moderate assistance Tub/Shower Transfer Method: Stand pivot Tub/Shower Equipment: Walk in Retail buyer:  Moderate assistance Vision Baseline Vision/History: Wears glasses Wears Glasses: Reading only Patient Visual Report: Blurring of vision(Reportes mild blurred vision in R eye since admission) Vision Assessment?: Yes Eye Alignment: Within Functional Limits Ocular Range of Motion: Within Functional Limits Alignment/Gaze Preference: Within Defined Limits Tracking/Visual Pursuits: Able to track stimulus in all quads without difficulty Saccades: Within functional limits Convergence: Within functional limits Perception  Perception: Within Functional Limits Praxis Praxis: Impaired Praxis Impairment Details: Motor planning Cognition Overall Cognitive Status: Within Functional Limits for tasks assessed Arousal/Alertness: Awake/alert Orientation Level: Person;Place;Situation Person: Oriented Place: Oriented Situation: Oriented Year: 2021 Month: January Day of Week: Correct Memory: Appears intact Immediate Memory Recall: Sock;Blue;Bed Memory Recall Sock: Without Cue Memory Recall Blue: Without Cue Memory Recall Bed: Without Cue Attention: Focused;Sustained Focused Attention: Appears intact Sustained Attention: Appears intact Awareness: Appears intact Problem Solving: Appears intact Safety/Judgment: Appears intact Sensation Sensation Light Touch: Appears  Intact Light Touch Impaired Details: Impaired RUE;Impaired RLE Proprioception: Impaired Detail Proprioception Impaired Details: Impaired RUE Additional Comments: R LE tested for proprioception, intact 5/5 Coordination Gross Motor Movements are Fluid and Coordinated: No Fine Motor Movements are Fluid and Coordinated: No Coordination and Movement Description: R UE with minimal movement against gravity, R LE weakness. Finger Nose Finger Test: WFL on L Heel Shin Test: [facility] on L, unable on R due to decreased hip strength Motor  Motor Motor: Hemiplegia;Abnormal postural alignment and control Mobility  Bed Mobility Bed Mobility: Rolling Right;Rolling Left;Supine to Sit;Sit to Supine Rolling Right: Minimal Assistance - Patient > 75% Rolling Left: Minimal Assistance - Patient > 75% Supine to Sit: Minimal Assistance - Patient > 75% Sit to Supine: Minimal Assistance - Patient > 75% Transfers Sit to Stand: Minimal Assistance - Patient > 75% Stand to Sit: Minimal Assistance - Patient > 75%  Trunk/Postural Assessment  Cervical Assessment Cervical Assessment: Within Functional Limits Thoracic Assessment Thoracic Assessment: Exceptions to WFL(rounded shoulders) Lumbar Assessment Lumbar Assessment: Within Functional Limits Postural Control Postural Control: Deficits on evaluation Protective Responses: delayed  Balance Balance Balance Assessed: Yes Static Sitting Balance Static Sitting - Balance Support: No upper extremity supported Static Sitting - Level of Assistance: 5: Stand by assistance Dynamic Sitting Balance Dynamic Sitting - Balance Support: No upper extremity supported Dynamic Sitting - Level of Assistance: 5: Stand by assistance Dynamic Sitting - Balance Activities: Lateral lean/weight shifting;Forward lean/weight shifting;Reaching for objects;Reaching across midline Sitting balance - Comments: pt able to sit EOB with guarding Static Standing Balance Static Standing - Balance  Support: No upper extremity supported;During functional activity Static Standing - Level of Assistance: 4: Min assist Dynamic Standing Balance Dynamic Standing - Balance Support: No upper extremity supported;During functional activity Dynamic Standing - Level of Assistance: 3: Mod assist Dynamic Standing - Balance Activities: Lateral lean/weight shifting;Forward lean/weight shifting Extremity/Trunk Assessment RUE Assessment RUE Assessment: Exceptions to [facility] General Strength Comments: movement in shoulder with slight elevation in shoulder and with shoulder abduction and slight elbow flexion- but not consistantly RUE Body System: Neuro Brunstrum levels for arm and hand: Arm;Hand Brunstrum level for arm: Stage II Synergy is developing Brunstrum level for hand: Stage I Flaccidity RUE Tone RUE Tone: Modified Ashworth Body Part - Modified Ashworth Scale: Elbow;Wrist;Thumb;Fingers Elbow - Modified Ashworth Scale for Grading Hypertonia RUE: No increase in muscle tone Wrist - Modified Ashworth Scale for Grading Hypertonia RUE: Slight increase in muscle tone, manifested by a catch and release or by minimal resistance at the end of the range of motion when the affected part(s) is moved in flexion  or extension Fingers - Modified Ashworth Scale for Grading Hypertonia RUE: No increase in muscle tone Thumb - Modified Ashworth Scale for Grading Hypertonia RUE: No increase in muscle tone LUE Assessment LUE Assessment: Within Functional Limits     Refer to Care Plan for Long Term Goals  Recommendations for other services: None    Discharge Criteria: Patient will be discharged from OT if patient refuses treatment 3 consecutive times without medical reason, if treatment goals not met, if there is a change in medical status, if patient makes no progress towards goals or if patient is discharged from hospital.  The above assessment, treatment plan, treatment alternatives and goals were discussed and  mutually agreed upon: by patient  Nicoletta Ba 06/21/2019, 6:24 PM

## 2019-06-21 NOTE — Evaluation (Signed)
Physical Therapy Assessment and Plan  Patient Details  Name: Megan Martinez MRN: [id number] Date of Birth: 1951/02/16  PT Diagnosis: Abnormal posture, Abnormality of gait, Difficulty walking, Edema, Hemiplegia dominant, Hypotonia and Muscle weakness Rehab Potential: Excellent ELOS: 10-14 days   Today's Date: 06/21/2019 PT Individual Time: 2774-1287 PT Individual Time Calculation (min): 78 min    Problem List:  Patient Active Problem List   Diagnosis Date Noted  . Transaminitis   . Hypoalbuminemia due to protein-calorie malnutrition ([place])   . Labile blood pressure   . Left pontine cerebrovascular accident ([place]) 06/20/2019  . Acute ischemic stroke ([place])   . Right hemiparesis ([place])   . Dysphagia, post-stroke   . Basilar artery stenosis with infarction [facility]) s/p tPA 06/19/2019  . Stroke (cerebrum) ([place]) L pontine d/t BA stenosis 06/14/2019  . Subcutaneous nodule of left foot 01/03/2019  . Atopic dermatitis 10/18/2018  . Seasonal allergies 08/09/2018  . 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 Osteoarthritis of carpometacarpal (CMC) joint of thumb 11/07/2017  . Acquired hypothyroidism 08/04/2015  . Essential hypertension 08/04/2015  . GERD without esophagitis 08/04/2015  . Mixed hyperlipidemia 08/04/2015  . Osteopenia 08/04/2015  . Primary insomnia 08/04/2015  . Pure hypertriglyceridemia 08/04/2015    Past Medical History:  Past Medical History:  Diagnosis Date  . Allergy   . Arthritis   . Hypertension   . Thyroid disease    Past Surgical History:  Past Surgical History:  Procedure Laterality Date  . ABDOMINAL HYSTERECTOMY    . APPENDECTOMY    . CATARACT EXTRACTION    . CHOLECYSTECTOMY    . TUBAL LIGATION      Assessment & Plan Clinical Impression: Patient is a 69 y.o. year old right-handed female with history of hypertension.  History taken from chart review and patient. 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  Presented 06/14/2019 with right-sided weakness and dysarthria.  Cranial CT unremarkable for acute intracranial process.  Patient did receive TPA.  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  Moderate proximal PCA stenosis bilaterally.  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  Echocardiogram with ejection fraction of 65% without emboli.  Admission chemistries with potassium 3.0, urine drug screen negative.  Neurology follow-up maintained on aspirin and Plavix for CVA prophylaxis x3 months then Plavix alone.  Subcutaneous Lovenox for DVT prophylaxis.  Tolerating mechanical soft diet.  Maintain on Cardene drip initially for blood pressure control.  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  Therapy evaluations completed and patient was admitted for a comprehensive rehab program.  Patient transferred to CIR on 06/20/2019 .   Patient currently requires min with mobility secondary to muscle weakness, impaired timing and sequencing, abnormal tone, decreased coordination and decreased motor planning, decreased visual acuity and decreased sitting balance, decreased standing balance, decreased postural control, hemiplegia and decreased balance strategies.  Prior to hospitalization, patient was independent  with mobility and lived with Alone in a House home.  Home access is 2 small stepsStairs to enter.  Patient will benefit from skilled PT intervention to maximize safe functional mobility, minimize fall risk and decrease caregiver burden for planned  discharge home with intermittent assist.  Anticipate patient will benefit from follow up  OP at discharge.  PT - End of Session Activity Tolerance: Endurance does not limit participation in activity Endurance Deficit: No PT Assessment Rehab Potential (ACUTE/IP ONLY): Excellent PT Barriers to Discharge: Home environment access/layout PT Barriers to Discharge Comments: 2 STE home without rails and a flight of stairs to [employer] and work area. PT Patient demonstrates impairments in the following area(s): Balance;Edema;Endurance;Motor;Nutrition;Perception;Safety;Sensory;Skin Integrity;Pain;Behavior PT Transfers Functional Problem(s): Bed Mobility;Bed to Chair;Car;Furniture;Floor PT Locomotion Functional Problem(s): Ambulation;Wheelchair Mobility;Stairs PT Plan PT Intensity: Minimum of 1-2 x/day ,45 to 90 minutes PT Frequency: 5 out of 7 days PT Duration Estimated Length of Stay: 10-14 days PT Treatment/Interventions: Ambulation/gait training;Discharge planning;Functional mobility training;Psychosocial support;Therapeutic Activities;Visual/perceptual remediation/compensation;Balance/vestibular training;Disease management/prevention;Neuromuscular re-education;Skin care/wound management;Therapeutic Exercise;Wheelchair propulsion/positioning;DME/adaptive equipment instruction;Pain management;Splinting/orthotics;UE/LE Strength taining/ROM;Community reintegration;Functional electrical stimulation;Patient/family education;Stair training;UE/LE Coordination activities PT Transfers Anticipated Outcome(s): mod I using LRAD. PT Locomotion Anticipated Outcome(s): Supervision 150 feet using LRAD. PT Recommendation Recommendations for Other Services: Neuropsych consult;Therapeutic Recreation consult Therapeutic Recreation Interventions: Stress management Follow Up Recommendations: Outpatient PT Patient destination: Home Equipment Recommended: To be determined Equipment Details: TBD based on patient's progress with therapy, patient has no DME at home  Skilled Therapeutic Intervention In  addition to the PT evaluation below, the patient performed the following skilled PT interventions: Patient in w/c in room upon PT arrival. Patient alert and agreeable to PT session. Patient denied pain during session.  Therapeutic Activity: Bed Mobility: Patient performed rolling R/L and supine to/from sit with min A on a mat table.  Transfers: Patient performed sit to/from stand x1 without and AD with min A, x1 using a L rail and x1 using a RW with R hand splint with min A-CGA. She performed stand pivot transfers x2 with min A without an AD and squat pivot transfer with min A to the R and CGA to the L. Provided verbal cues for hand placement with hand-over-hand assist for placement of L hand in splint on RW, and cues for leaning forward to stand. Patient performed a simulated low sedan height car transfer with min A performed a stand pivot and min A to bring R LE into the car using no AD.   Gait Training:  Patient ambulated 16 feet without an AD with min-mod A, limited by decreased balance and safety due to decreased R foot clearance. She then ambulated 82 feet using a RW with a R hand splint and R DF wrap with min A for weight shift to the L for increased R foot clearance. Ambulated as described below. Provided verbal cues for R weight shift, R knee control with quad activation to prevent recurvatum, and increased R hip flexion with initiation of step. Patient ascending/descended 4-6" steps using L rail with min-mod A for balance and R LE advancement. Performed step-to gait pattern initially attempting to lead with R LE then switching to L LE while ascending and leading with R LE while descending.   Wheelchair Mobility:  Patient propelled wheelchair 5 feet with B LEs with poor initiation of R LE leading to dragger her L foot behind her, limited distance due to safety concerns. She then propelled the wheelchair 152 feet using L hemi-technique. Provided verbal cues for hemi-technique and turning  technique.   Patient in w/c in room with R lap tray at end of session with breaks locked, seat belt alarm set, and all needs within reach.   Instructed pt in results of PT evaluation as detailed  below, PT POC, rehab potential, rehab goals, and discharge recommendations. Additionally discussed CIR's policies regarding fall safety and use of chair alarm and/or quick release belt. Pt verbalized understanding and in agreement. Will update pt's family members as they become available.    PT Evaluation Precautions/Restrictions Precautions Precautions: Fall Precaution Comments: right hemiparesis Restrictions Weight Bearing Restrictions: No Other Position/Activity Restrictions: RLE ace wrapped into dorsiflexion. Home Living/Prior Functioning Home Living Available Help at Discharge: Family;Friend(s);Available 24 hours/day Type of Home: House Home Access: Stairs to enter [employer] of Steps: 2 small steps Entrance Stairs-Rails: None Home Layout: Two level;Laundry or work area in basement Alternate Therapist, sports of Steps: 1 flight with one rail Bathroom Shower/Tub: Tub/shower unit;Walk-in shower Additional Comments: boyfriend of 11 years lives next door; [place], daughter, is local and unemployed  Lives With: Alone Prior Function Level of Independence: Independent with basic ADLs;Independent with gait;Independent with homemaking with ambulation;Independent with transfers  Able to Take Stairs?: Reciprically Driving: Yes Vocation: Part time employment Vision/Perception  Vision - Assessment Eye Alignment: Within Functional Limits Ocular Range of Motion: Within Functional Limits Alignment/Gaze Preference: Within Defined Limits Tracking/Visual Pursuits: Able to track stimulus in all quads without difficulty Saccades: Within functional limits Convergence: Within functional limits Perception Perception: Within Functional Limits Praxis Praxis: Impaired Praxis Impairment  Details: Motor planning  Cognition Overall Cognitive Status: Within Functional Limits for tasks assessed Arousal/Alertness: Awake/alert Attention: Focused;Sustained Focused Attention: Appears intact Sustained Attention: Appears intact Memory: Appears intact Immediate Memory Recall: Sock;Blue;Bed Memory Recall Sock: Without Cue Memory Recall Blue: Without Cue Memory Recall Bed: Without Cue Awareness: Appears intact Problem Solving: Appears intact Safety/Judgment: Appears intact Sensation Sensation Light Touch: Appears Intact Light Touch Impaired Details: Impaired RUE;Impaired RLE Proprioception: Impaired Detail Proprioception Impaired Details: Impaired RUE Additional Comments: R LE tested for proprioception, intact 5/5 Coordination Gross Motor Movements are Fluid and Coordinated: No Fine Motor Movements are Fluid and Coordinated: No Coordination and Movement Description: R UE with minimal movement against gravity, R LE weakness. Finger Nose Finger Test: WFL on L Heel Shin Test: [facility] on L, unable on R due to decreased hip strength Motor  Motor Motor: Hemiplegia;Abnormal postural alignment and control  Mobility Bed Mobility Bed Mobility: Rolling Right;Rolling Left;Supine to Sit;Sit to Supine Rolling Right: Minimal Assistance - Patient > 75% Rolling Left: Minimal Assistance - Patient > 75% Supine to Sit: Minimal Assistance - Patient > 75% Sit to Supine: Minimal Assistance - Patient > 75% Transfers Transfers: Sit to Stand;Stand to Sit;Stand Pivot Transfers;Squat Pivot Transfers Sit to Stand: Minimal Assistance - Patient > 75% Stand to Sit: Minimal Assistance - Patient > 75% Stand Pivot Transfers: Minimal Assistance - Patient > 75% Stand Pivot Transfer Details: Manual facilitation for weight shifting Squat Pivot Transfers: Minimal Assistance - Patient > 75% Transfer (Assistive device): None Locomotion  Gait Ambulation: Yes Gait Assistance: Moderate Assistance - Patient  50-74% Gait Distance (Feet): 16 Feet Assistive device: None Gait Assistance Details: Manual facilitation for weight shifting Gait Gait: Yes Gait Pattern: Decreased stance time - right;Decreased step length - left;Decreased step length - right;Decreased hip/knee flexion - right;Decreased dorsiflexion - right;Decreased weight shift to right;Right hip hike;Right genu recurvatum;Decreased trunk rotation;Trunk rotated posteriorly on right;Trunk flexed;Narrow base of support;Poor foot clearance - right Stairs / Additional Locomotion Stairs: Yes Stairs Assistance: Moderate Assistance - Patient 50 - 74% Stair Management Technique: One rail Left Number of Stairs: 4 Height of Stairs: 6 Wheelchair Mobility Wheelchair Mobility: Yes Wheelchair Assistance: Engineer, maintenance (IT) Propulsion: Left upper extremity;Left lower extremity  Wheelchair Parts Management: Needs assistance Distance: 5'  Trunk/Postural Assessment  Cervical Assessment Cervical Assessment: Within Functional Limits Thoracic Assessment Thoracic Assessment: Exceptions to WFL(rounded shoulders) Lumbar Assessment Lumbar Assessment: Within Functional Limits Postural Control Postural Control: Deficits on evaluation Protective Responses: delayed  Balance Balance Balance Assessed: Yes Static Sitting Balance Static Sitting - Balance Support: No upper extremity supported Static Sitting - Level of Assistance: 5: Stand by assistance Dynamic Sitting Balance Dynamic Sitting - Balance Support: No upper extremity supported Dynamic Sitting - Level of Assistance: 5: Stand by assistance Dynamic Sitting - Balance Activities: Lateral lean/weight shifting;Forward lean/weight shifting;Reaching for objects;Reaching across midline Sitting balance - Comments: pt able to sit EOB with guarding Static Standing Balance Static Standing - Balance Support: No upper extremity supported;During functional activity Static Standing - Level  of Assistance: 4: Min assist Dynamic Standing Balance Dynamic Standing - Balance Support: No upper extremity supported;During functional activity Dynamic Standing - Level of Assistance: 3: Mod assist Dynamic Standing - Balance Activities: Lateral lean/weight shifting;Forward lean/weight shifting Extremity Assessment  RUE Assessment RUE Assessment: Exceptions to [facility] General Strength Comments: movement in shoulder with slight elevation in shoulder and with shoulder abduction and slight elbow flexion- but not consistantly RUE Body System: Neuro Brunstrum levels for arm and hand: Arm;Hand Brunstrum level for arm: Stage II Synergy is developing Brunstrum level for hand: Stage I Flaccidity RUE Tone RUE Tone: Modified Ashworth Body Part - Modified Ashworth Scale: Elbow;Wrist;Thumb;Fingers Elbow - Modified Ashworth Scale for Grading Hypertonia RUE: No increase in muscle tone Wrist - Modified Ashworth Scale for Grading Hypertonia RUE: Slight increase in muscle tone, manifested by a catch and release or by minimal resistance at the end of the range of motion when the affected part(s) is moved in flexion or extension Fingers - Modified Ashworth Scale for Grading Hypertonia RUE: No increase in muscle tone Thumb - Modified Ashworth Scale for Grading Hypertonia RUE: No increase in muscle tone LUE Assessment LUE Assessment: Within Functional Limits RLE Assessment RLE Assessment: Exceptions to [facility] Active Range of Motion (AROM) Comments: Limited DF to neutral General Strength Comments: Grossly in sitting: hip flexion 2-/5, knee extension 4-/5, knee flexion 2+/5, DF 1/5, PF 3+/5 LLE Assessment LLE Assessment: Within Functional Limits Active Range of Motion (AROM) Comments: WFL for all functional mobility General Strength Comments: Grossly in sitting: 5/5 throughout    Refer to Care Plan for Long Term Goals  Recommendations for other services: Neuropsych and Therapeutic Recreation  Stress  management  Discharge Criteria: Patient will be discharged from PT if patient refuses treatment 3 consecutive times without medical reason, if treatment goals not met, if there is a change in medical status, if patient makes no progress towards goals or if patient is discharged from hospital.  The above assessment, treatment plan, treatment alternatives and goals were discussed and mutually agreed upon: by patient  Doreene Burke PT, DPT  06/21/2019, 5:33 PM

## 2019-06-21 NOTE — Progress Notes (Signed)
[place] PHYSICAL MEDICINE & REHABILITATION PROGRESS NOTE  Subjective/Complaints: Patient seen laying in bed this morning.  She states she slept well overnight.  She states she is ready begin therapies today.  ROS: Denies CP, shortness of breath, nausea, vomiting, diarrhea.  Objective: Vital Signs: Blood pressure 123/80, pulse (!) 52, temperature 98 F (36.7 C), temperature source Oral, resp. rate 17, height 5\' 2"  (1.575 m), weight 72.8 kg, SpO2 96 %. No results found. Recent Labs    06/20/19 0417 06/21/19 0457  WBC 6.0 6.9  HGB 13.6 13.9  HCT 40.9 41.2  PLT 252 254   Recent Labs    06/20/19 0417 06/21/19 0457  NA 141 143  K 3.7 3.7  CL 107 110  CO2 25 25  GLUCOSE 99 105*  BUN 15 15  CREATININE 0.93 0.89  CALCIUM 9.1 9.1    Physical Exam: BP 123/80 (BP Location: Left Arm)   Pulse (!) 52   Temp 98 F (36.7 C) (Oral)   Resp 17   Ht 5\' 2"  (1.575 m)   Wt 72.8 kg   SpO2 96%   BMI 29.34 kg/m  Constitutional: No distress . Vital signs reviewed. HENT: Normocephalic.  Atraumatic. Eyes: EOMI. No discharge. Cardiovascular: No JVD. Respiratory: Normal effort.  No stridor. GI: Non-distended. Skin: Warm and dry.  Intact. Psych: Normal mood.  Normal behavior. Musc: No edema in extremities.  No tenderness in extremities. Neurological: Alert Dysarthria She makes eye contact with examiner and follows simple commands.   Appears to have fair insight and awareness. Motor: RUE: 0/5 proximal and distal, unchanged RLE: Hip flexion, knee extension 4/5, ankle dorsiflexion 0/5, unchanged LUE: 5/5 proximal distal LLE: 5/5 proximal distal  Assessment/Plan: 1. Functional deficits secondary to left pontine infarct which require 3+ hours per day of interdisciplinary therapy in a comprehensive inpatient rehab setting.  Physiatrist is providing close team supervision and 24 hour management of active medical problems listed below.  Physiatrist and rehab team continue to assess  barriers to discharge/monitor patient progress toward functional and medical goals  Care Tool:  Bathing              Bathing assist       Upper Body Dressing/Undressing Upper body dressing   What is the patient wearing?: Hospital gown only    Upper body assist Assist Level: Minimal Assistance - Patient > 75%    Lower Body Dressing/Undressing Lower body dressing    Lower body dressing activity did not occur: Safety/medical concerns       Lower body assist       Toileting Toileting    Toileting assist Assist for toileting: Minimal Assistance - Patient > 75%(bedpan)     Transfers Chair/bed transfer  Transfers assist  Chair/bed transfer activity did not occur: Safety/medical concerns        Locomotion Ambulation   Ambulation assist              Walk 10 feet activity   Assist           Walk 50 feet activity   Assist           Walk 150 feet activity   Assist           Walk 10 feet on uneven surface  activity   Assist           Wheelchair     Assist               Wheelchair 50 feet  with 2 turns activity    Assist            Wheelchair 150 feet activity     Assist            Medical Problem List and Plan: 1.  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  PLAN BASILAR ARTERY STENTING AFTER REHAB PROGRAM completed per interventional radiology  Begin CIR evaluations  WHO/PRAFO ordered 2.  Antithrombotics: -DVT/anticoagulation: Lovenox   CBC within normal limits on 1/28             -antiplatelet therapy: Aspirin 325 mg daily, Plavix 75 mg daily x3 months then Plavix alone 3. Pain Management: Tylenol as needed 4. Mood: Provide emotional support             -antipsychotic agents: N/A 5. Neuropsych: This patient is capable of making decisions on her own behalf. 6. Skin/Wound Care: Routine skin checks 7.  Fluids/Electrolytes/Nutrition: Routine in and outs 8.  Essential hypertension: Patient on Norvasc 10 mg daily and cadestran-HCTZ 32-25 mg daily prior to admission.  Resume as needed             Labile at present, monitor with increased mobility 9.  Hypothyroidism.  Continue Synthroid 10.  Hyperlipidemia: Continue Lipitor 11.  Post stroke dysphagia             D3 thins, advance diet as tolerated 12.  Transaminitis  LFTs elevated on 1/28, continue to monitor 13.  Hypoalbuminemia  Supplement initiated on 1/28  LOS: 1 days A FACE TO FACE EVALUATION WAS PERFORMED  Ankit Lorie Phenix 06/21/2019, 8:50 AM

## 2019-06-22 ENCOUNTER — Inpatient Hospital Stay (HOSPITAL_COMMUNITY): Payer: PPO | Admitting: Physical Therapy

## 2019-06-22 ENCOUNTER — Inpatient Hospital Stay (HOSPITAL_COMMUNITY): Payer: PPO | Admitting: Speech Pathology

## 2019-06-22 ENCOUNTER — Inpatient Hospital Stay (HOSPITAL_COMMUNITY): Payer: PPO | Admitting: Occupational Therapy

## 2019-06-22 NOTE — Progress Notes (Signed)
[facility] PHYSICAL MEDICINE & REHABILITATION PROGRESS NOTE  Subjective/Complaints: Patient seen sitting up in bed this morning.  She states that he would pursue therapies yesterday.  She is wearing her orthoses.  ROS: Denies CP, shortness of breath, nausea, vomiting, diarrhea.  Objective: Vital Signs: Blood pressure (!) 142/68, pulse (!) 47, temperature 98.1 F (36.7 C), temperature source Oral, resp. rate 17, height 5\' 2"  (1.575 m), weight 72.8 kg, SpO2 97 %. No results found. Recent Labs    06/20/19 0417 06/21/19 0457  WBC 6.0 6.9  HGB 13.6 13.9  HCT 40.9 41.2  PLT 252 254   Recent Labs    06/20/19 0417 06/21/19 0457  NA 141 143  K 3.7 3.7  CL 107 110  CO2 25 25  GLUCOSE 99 105*  BUN 15 15  CREATININE 0.93 0.89  CALCIUM 9.1 9.1    Physical Exam: BP (!) 142/68 (BP Location: Left Arm)   Pulse (!) 47   Temp 98.1 F (36.7 C) (Oral)   Resp 17   Ht 5\' 2"  (1.575 m)   Wt 72.8 kg   SpO2 97%   BMI 29.34 kg/m  Constitutional: No distress . Vital signs reviewed. HENT: Normocephalic.  Atraumatic. Eyes: EOMI. No discharge. Cardiovascular: No JVD. Respiratory: Normal effort.  No stridor. GI: Non-distended. Skin: Warm and dry.  Intact. Psych: Normal mood.  Normal behavior. Musc: No edema in extremities.  No tenderness in extremities. Neurological: Alert Dysarthria, unchanged She makes eye contact with examiner and follows simple commands.   Appears to have fair insight and awareness. Motor: RUE: 0/5 proximal and distal, stable RLE: Hip flexion, knee extension 4/5, ankle dorsiflexion 0/5, stable LUE: 5/5 proximal distal LLE: 5/5 proximal distal  Assessment/Plan: 1. Functional deficits secondary to left pontine infarct which require 3+ hours per day of interdisciplinary therapy in a comprehensive inpatient rehab setting.  Physiatrist is providing close team supervision and 24 hour management of active medical problems listed below.  Physiatrist and rehab team  continue to assess barriers to discharge/monitor patient progress toward functional and medical goals  Care Tool:  Bathing              Bathing assist       Upper Body Dressing/Undressing Upper body dressing   What is the patient wearing?: Orthosis    Upper body assist Assist Level: Minimal Assistance - Patient > 75%    Lower Body Dressing/Undressing Lower body dressing    Lower body dressing activity did not occur: Safety/medical concerns       Lower body assist Assist for lower body dressing: Minimal Assistance - Patient > 75%     Toileting Toileting    Toileting assist Assist for toileting: Minimal Assistance - Patient > 75%(bedpan)     Transfers Chair/bed transfer  Transfers assist  Chair/bed transfer activity did not occur: N/A  Chair/bed transfer assist level: Minimal Assistance - Patient > 75%     Locomotion Ambulation   Ambulation assist      Assist level: Moderate Assistance - Patient 50 - 74% Assistive device: No Device Max distance: 16'   Walk 10 feet activity   Assist     Assist level: Moderate Assistance - Patient - 50 - 74% Assistive device: No Device   Walk 50 feet activity   Assist Walk 50 feet with 2 turns activity did not occur: Safety/medical concerns(R hemi, decreased strength/balance)         Walk 150 feet activity   Assist Walk 150 feet activity  did not occur: Safety/medical concerns(R hemi, decreased strength/balance)         Walk 10 feet on uneven surface  activity   Assist Walk 10 feet on uneven surfaces activity did not occur: Safety/medical concerns(R hemi, decreased strength/balance)         Wheelchair     Assist   Type of Wheelchair: Manual    Wheelchair assist level: Contact Guard/Touching assist Max wheelchair distance: 5'    Wheelchair 50 feet with 2 turns activity    Assist    Wheelchair 50 feet with 2 turns activity did not occur: Safety/medical concerns(R hemi, required  skilled interventions for safe technique)       Wheelchair 150 feet activity     Assist Wheelchair 150 feet activity did not occur: Safety/medical concerns(R hemi, required skilled interventions for safe technique)          Medical Problem List and Plan: 1.  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  PLAN BASILAR ARTERY STENTING AFTER REHAB PROGRAM completed per interventional radiology  Cont CIR   WHO/PRAFO qhs 2.  Antithrombotics: -DVT/anticoagulation: Lovenox   CBC within normal limits on 1/28             -antiplatelet therapy: Aspirin 325 mg daily, Plavix 75 mg daily x3 months then Plavix alone 3. Pain Management: Tylenol as needed 4. Mood: Provide emotional support             -antipsychotic agents: N/A 5. Neuropsych: This patient is capable of making decisions on her own behalf. 6. Skin/Wound Care: Routine skin checks 7. Fluids/Electrolytes/Nutrition: Routine in and outs 8.  Essential hypertension: Patient on Norvasc 10 mg daily and cadestran-HCTZ 32-25 mg daily prior to admission.  Resume as needed             Slightly elevated on 1/29, monitor for trend  Monitor with increased mobility 9.  Hypothyroidism.  Continue Synthroid 10.  Hyperlipidemia: Continue Lipitor 11.  Post stroke dysphagia             D3 thins, advance diet as tolerated 12.  Transaminitis  LFTs elevated on 1/28, labs ordered for Monday 13.  Hypoalbuminemia  Supplement initiated on 1/28  LOS: 2 days A FACE TO FACE EVALUATION WAS PERFORMED  Diarra Kos Lorie Phenix 06/22/2019, 8:10 AM

## 2019-06-22 NOTE — Progress Notes (Signed)
Speech Language Pathology Daily Session Note  Patient Details  Name: Megan Martinez MRN: [id number] Date of Birth: [birth year]  Today's Date: 06/22/2019 SLP Individual Time: 0900-0940 SLP Individual Time Calculation (min): 40 min  Short Term Goals: Week 1: SLP Short Term Goal 1 (Week 1): Patient will consume trials of regular textures with efficient mastication and complete oral clearance without overt s/s of aspiration with supervision level verbal cues over 2 sessions prior to upgrade. SLP Short Term Goal 2 (Week 1): Patient will consume current diet with minimal overt s/s of aspiration with Mod I for use of swallowing compensatory strategies. SLP Short Term Goal 3 (Week 1): Patient will utilize speech intelligibility strategies at the phrase level with Min A verbal cues to achieve ~75% intelligibility.  Skilled Therapeutic Interventions: Skilled treatment session focused on speech goals. SLP facilitated session by providing overall Mod A verbal cues for use of speech intelligibility strategies at the phrase and sentence level during a verbal description task.  Mod A verbal cues were also required to self-monitor and correct speech errors throughout task. Patient left upright in bed with alarm on and all needs within reach. Continue with current plan of care.      Pain Pain Assessment Pain Scale: 0-10 Pain Score: 0-No pain  Therapy/Group: Individual Therapy  Rhealyn Cullen 06/22/2019, 12:52 PM

## 2019-06-22 NOTE — Progress Notes (Signed)
Physical Therapy Session Note  Patient Details  Name: Megan Martinez MRN: [id number] Date of Birth: 1951/03/15  Today's Date: 06/22/2019 PT Individual Time: 1522-1620 PT Individual Time Calculation (min): 58 min   Short Term Goals: Week 1:  PT Short Term Goal 1 (Week 1): Patient will perform basic transfers with close supervision using LRAD. PT Short Term Goal 2 (Week 1): Patient will ambulate >100 feet using LRAD with CGA. PT Short Term Goal 3 (Week 1): Patient will perform dynamic standing balance activities with CGA without UE support.  Skilled Therapeutic Interventions/Progress Updates:    Pt received sitting in w/c and agreeable to therapy session. Transported to/from gym in w/c for time management and energy conservation. Therapist donned R LE ankle DF wrap. Gait training 44ft using RW w/ R hand orthosis, min assist for balance and RW management - while wearing ACE wrap pt demonstrates improved heel strike though continues to have poor foot clearance during swing and intermittent knee instability during stance. Gait training 60ft using RW with min assist for balance - cuing for improved R LE foot clearance, increased step length for symmetrical steps and improved heel strike - towards end of walk demonstrates R quad fatigue with slight knee buckling. Repeated sit<>stands, no UE support, to/from EOM 2x15reps with mirror feedback and min assist for balance - manual facilitation and multimodal cuing for increased R weight shift - demonstrates good quad activation and therapist blocking R knee hyperextension. Standing R LE NMR, no UE support, attempted placing R foot on/off 4" step but pt unable to perform adequate hip/knee flexion to place foot up - transitioned to 2" step but pt continued to have difficulty. Sit>supine with min assist for B LE management. Supine R LE NMR of active assisted heel slides 2x10 reps with focus on hip/knee flexor activation as well as bridging 2x15 reps with cuing to  maintain hips level to ensure adequate R glute activation. Supine>sit with min assist for B LE management and trunk upright. Stand pivot EOM>w/c using RW with min assist for balance and AD management - cuing for sequencing and increased R step length. Transported back to room and left seated in w/c with needs in reach and seat belt alarm on.  Therapy Documentation Precautions:  Precautions Precautions: Fall Precaution Comments: right hemiparesis Restrictions Weight Bearing Restrictions: No Other Position/Activity Restrictions: RLE ace wrapped into dorsiflexion.  Pain: Denies pain during session.   Therapy/Group: Individual Therapy  Tawana Scale, PT, DPT 06/22/2019, 3:20 PM

## 2019-06-22 NOTE — Progress Notes (Signed)
Occupational Therapy Session Note  Patient Details  Name: Megan Martinez MRN: [id number] Date of Birth: 1950/07/06  Today's Date: 06/22/2019 OT Individual Time: 1045-1200 OT Individual Time Calculation (min): 75 min (unattended estim on shoulder 1200-1245)   Short Term Goals: Week 1:  OT Short Term Goal 1 (Week 1): Pt will peform toileting with min A with extra time OT Short Term Goal 2 (Week 1): Pt will perform LB dressing with mod A including footware OT Short Term Goal 3 (Week 1): Pt will perform one grooming task in standing with min A OT Short Term Goal 4 (Week 1): Pt will perform bed mobility in prep for ADL with min A wtih cues  Skilled Therapeutic Interventions/Progress Updates:    Pt received in wc dressed and ready for the day.  Pt discussed that her R arm is not working at all and she is anxious to get it moving again.  Pt does have active scapular elevation and trace retraction.  She seems to have good tone in her shoulder and does not present as a high risk for subluxation.  No active movement in the rest of her arm.  Pt agreeable to trying estim.  Using the saebo stim one on her forearm for wrist and finger extension for 20 min, pt worked on thinking about moving her hand herself as the stimulation came on and then integrated in FES with grasp/release activity using cones.   Then placed unit on R tricep for FES of a reaching movement with holding a dowel bar in B hands and pushing bar out with stimulation with A on R from therapist.  Tolerated 15 min of stimulation.  Moved estim to R shoulder. With stimulation, a/arom of sh abduction to 45 degrees.  Integrated in standing static balance with exercises with min A to support balance. Stand to squat 10 x with min-mod A and cues for forward lean.  Pt tolerating estim well so left on shoulder for 45 min of unattended estim.  No adverse reactions.  Saebo Stim One 330 pulse width 35 Hz pulse rate On 8 sec/ off 8 sec Ramp up/  down 2 sec Symmetrical Biphasic wave form  Max intensity 171m at 500 Ohm load  Pt in w/c with wrist support on, lap tray, alarm belt and all needs met.      Therapy Documentation Precautions:  Precautions Precautions: Fall Precaution Comments: right hemiparesis Restrictions Weight Bearing Restrictions: No Other Position/Activity Restrictions: RLE ace wrapped into dorsiflexion. Pain: Pain Assessment Pain Scale: 0-10 Pain Score: 0-No pain ADL: ADL Eating: Minimal assistance Grooming: Minimal assistance Upper Body Bathing: Minimal assistance Where Assessed-Upper Body Bathing: Shower Lower Body Bathing: Moderate assistance Where Assessed-Lower Body Bathing: Shower Upper Body Dressing: Moderate assistance Lower Body Dressing: Moderate assistance Where Assessed-Lower Body Dressing: Wheelchair Toileting: Moderate assistance Toilet Transfer: Moderate assistance Tub/Shower Transfer Method: Stand pivot Tub/Shower Equipment: Walk in sRetail buyer Moderate assistance      Therapy/Group: Individual Therapy  Tayvian Holycross 06/22/2019, 12:38 PM

## 2019-06-23 ENCOUNTER — Inpatient Hospital Stay (HOSPITAL_COMMUNITY): Payer: PPO | Admitting: Speech Pathology

## 2019-06-23 ENCOUNTER — Inpatient Hospital Stay (HOSPITAL_COMMUNITY): Payer: PPO | Admitting: Occupational Therapy

## 2019-06-23 ENCOUNTER — Inpatient Hospital Stay (HOSPITAL_COMMUNITY): Payer: PPO | Admitting: Physical Therapy

## 2019-06-23 NOTE — Progress Notes (Signed)
[facility] PHYSICAL MEDICINE & REHABILITATION PROGRESS NOTE  Subjective/Complaints: Megan Martinez has some cough this morning. She is sitting up in chair awaiting therapies. Denies pain Denies constipation.  ROS: Denies CP, shortness of breath, nausea, vomiting, diarrhea.  Objective: Vital Signs: Blood pressure 134/65, pulse (!) 58, temperature 98.3 F (36.8 C), temperature source Oral, resp. rate 16, height 5\' 2"  (1.575 m), weight 72.8 kg, SpO2 94 %. No results found. Recent Labs    06/21/19 0457  WBC 6.9  HGB 13.9  HCT 41.2  PLT 254   Recent Labs    06/21/19 0457  NA 143  K 3.7  CL 110  CO2 25  GLUCOSE 105*  BUN 15  CREATININE 0.89  CALCIUM 9.1    Physical Exam: BP 134/65   Pulse (!) 58   Temp 98.3 F (36.8 C) (Oral)   Resp 16   Ht 5\' 2"  (1.575 m)   Wt 72.8 kg   SpO2 94%   BMI 29.34 kg/m  Constitutional: No distress . Vital signs reviewed. Sitting up in chair. HENT: Normocephalic.  Atraumatic. Eyes: EOMI. No discharge. Cardiovascular: No JVD. Respiratory: Normal effort.  No stridor. +dry cough GI: Non-distended. Skin: Warm and dry.  Intact. Psych: Normal mood.  Normal behavior. Very pleasant Musc: No edema in extremities.  No tenderness in extremities. Neurological: Alert Dysarthria, unchanged She makes eye contact with examiner and follows simple commands.   Appears to have fair insight and awareness. Motor: RUE: 0/5 proximal and distal, stable RLE: Hip flexion, knee extension 4/5, ankle dorsiflexion 0/5, stable LUE: 5/5 proximal distal LLE: 5/5 proximal distal  Assessment/Plan: 1. Functional deficits secondary to left pontine infarct which require 3+ hours per day of interdisciplinary therapy in a comprehensive inpatient rehab setting.  Physiatrist is providing close team supervision and 24 hour management of active medical problems listed below.  Physiatrist and rehab team continue to assess barriers to discharge/monitor patient progress  toward functional and medical goals  Care Tool:  Bathing    Body parts bathed by patient: Right arm, Chest, Abdomen, Front perineal area, Buttocks, Right upper leg, Left upper leg, Right lower leg, Left lower leg, Face   Body parts bathed by helper: Left arm     Bathing assist Assist Level: Minimal Assistance - Patient > 75%     Upper Body Dressing/Undressing Upper body dressing   What is the patient wearing?: Pull over shirt, Bra    Upper body assist Assist Level: Moderate Assistance - Patient 50 - 74%    Lower Body Dressing/Undressing Lower body dressing    Lower body dressing activity did not occur: Safety/medical concerns What is the patient wearing?: Pants, Underwear/pull up     Lower body assist Assist for lower body dressing: Minimal Assistance - Patient > 75%     Toileting Toileting    Toileting assist Assist for toileting: Maximal Assistance - Patient 25 - 49%     Transfers Chair/bed transfer  Transfers assist  Chair/bed transfer activity did not occur: N/A  Chair/bed transfer assist level: Minimal Assistance - Patient > 75%     Locomotion Ambulation   Ambulation assist      Assist level: Minimal Assistance - Patient > 75% Assistive device: Walker-rolling Max distance: 43ft   Walk 10 feet activity   Assist     Assist level: Minimal Assistance - Patient > 75% Assistive device: Walker-rolling   Walk 50 feet activity   Assist Walk 50 feet with 2 turns activity did not occur: Safety/medical  concerns(R hemi, decreased strength/balance)  Assist level: Minimal Assistance - Patient > 75% Assistive device: Walker-rolling    Walk 150 feet activity   Assist Walk 150 feet activity did not occur: Safety/medical concerns(R hemi, decreased strength/balance)         Walk 10 feet on uneven surface  activity   Assist Walk 10 feet on uneven surfaces activity did not occur: Safety/medical concerns(R hemi, decreased strength/balance)          Wheelchair     Assist   Type of Wheelchair: Manual    Wheelchair assist level: Contact Guard/Touching assist Max wheelchair distance: 5'    Wheelchair 50 feet with 2 turns activity    Assist    Wheelchair 50 feet with 2 turns activity did not occur: Safety/medical concerns(R hemi, required skilled interventions for safe technique)       Wheelchair 150 feet activity     Assist Wheelchair 150 feet activity did not occur: Safety/medical concerns(R hemi, required skilled interventions for safe technique)          Medical Problem List and Plan: 1.  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  PLAN BASILAR ARTERY STENTING AFTER REHAB PROGRAM completed per interventional radiology  Cont CIR   WHO/PRAFO qhs  1/30: Reviewed therapy notes. Participated well with SLP today, 75% intelligibility during task. Min to Mod A with OT in ADLs.  2.  Antithrombotics: -DVT/anticoagulation: Lovenox   CBC within normal limits on 1/28             -antiplatelet therapy: Aspirin 325 mg daily, Plavix 75 mg daily x3 months then Plavix alone 3. Pain Management: Tylenol as needed  1/30: No pain 4. Mood: Provide emotional support             -antipsychotic agents: N/A 5. Neuropsych: This patient is capable of making decisions on her own behalf. 6. Skin/Wound Care: Routine skin checks 7. Fluids/Electrolytes/Nutrition: Routine in and outs 8.  Essential hypertension: Patient on Norvasc 10 mg daily and cadestran-HCTZ 32-25 mg daily prior to admission.  Resume as needed             1/30: Flowsheet reviewed; well controlled today.   Monitor with increased mobility 9.  Hypothyroidism.  Continue Synthroid 10.  Hyperlipidemia: Continue Lipitor 11.  Post stroke dysphagia             D3 thins, advance diet as tolerated 12.  Transaminitis  LFTs elevated on 1/28, labs ordered for Monday 13.   Hypoalbuminemia  Supplement initiated on 1/28  LOS: 3 days A FACE TO FACE EVALUATION WAS PERFORMED  Megan Martinez Megan Martinez 06/23/2019, 1:13 PM

## 2019-06-23 NOTE — Progress Notes (Signed)
Physical Therapy Session Note  Patient Details  Name: Megan Martinez MRN: [id number] Date of Birth: 1950-10-09  Today's Date: 06/23/2019 PT Individual Time: [id number] PT Individual Time Calculation (min): 59 min   Short Term Goals: Week 1:  PT Short Term Goal 1 (Week 1): Patient will perform basic transfers with close supervision using LRAD. PT Short Term Goal 2 (Week 1): Patient will ambulate >100 feet using LRAD with CGA. PT Short Term Goal 3 (Week 1): Patient will perform dynamic standing balance activities with CGA without UE support.  Skilled Therapeutic Interventions/Progress Updates:    Pt received sitting in w/c and agreeable to therapy session.  Transported to/from gym in w/c for energy conservation and time management. Sit<>stands using RW with CGA/min assist for steadying throughout session - reinforcing education on proper sequencing of R hand placement on/off RW orthotic and proper positioning of R foot prior to initiating stance. Therapist donned R ankle DF ACE wrap. Gait training 65ft using RW with min assist for balance and for AD management when turning - with ACE wrap pt demonstrates good R LE foot clearance during swing, good heel strike, and more symmetrical step length. Sit>supine with min assist for R LE management. Supine R LE NMR of: active assisted heel slides 3 sets of 10reps with cuing for increased ROM; bridging with level 1 theraband around knees for hip abductor activation 2 sets of 15reps with cuing for increased R glute activation to maintain hips level during movement. Supine>sitting with mod assist for trunk upright due to poor motor planning. Therapist donned R PLS AFO in pt's shoe. Gait training ~72ft using RW while wearing AFO with min assist for balance and pt demonstrating worsening R foot clearance, worse heel strike, and knee hyperextension during stance - determined that it is currently most appropriate for pt to wear ACE wrap for improved gait mechanics.  Repeated sit<>stands 2sets of 12 reps, no UE support, with min assist for balance and mirror feedback with cuing and manual facilitation for increased R weight shift to increase R quad muscle activation. Standing, L arm around therapist's shoulders, performed L LE stepping forward/backwards to external target while focusing on R LE NMR for stance control - pt demonstrates lack of R quad muscle activation therefore had pt perform mini-squat position between reps to increase quad activation. L squat pivot EOM>w/c with min/mod assist. Transported back to room and left seated in w/c with needs in reach, seat belt alarm on, and R UE supported on tray table.   Therapy Documentation Precautions:  Precautions Precautions: Fall Precaution Comments: right hemiparesis Restrictions Weight Bearing Restrictions: No Other Position/Activity Restrictions: RLE ace wrapped into dorsiflexion.  Pain:   Denies pain during session.   Therapy/Group: Individual Therapy  Tawana Scale, PT, DPT 06/23/2019, 3:50 PM

## 2019-06-23 NOTE — Progress Notes (Addendum)
Speech Language Pathology Daily Session Note  Patient Details  Name: Megan Martinez MRN: [id number] Date of Birth: [birth year]  Today's Date: 06/23/2019 SLP Individual Time: [id number] SLP Individual Time Calculation (min): 50 min  Short Term Goals: Week 1: SLP Short Term Goal 1 (Week 1): Patient will consume trials of regular textures with efficient mastication and complete oral clearance without overt s/s of aspiration with supervision level verbal cues over 2 sessions prior to upgrade. SLP Short Term Goal 2 (Week 1): Patient will consume current diet with minimal overt s/s of aspiration with Mod I for use of swallowing compensatory strategies. SLP Short Term Goal 3 (Week 1): Patient will utilize speech intelligibility strategies at the phrase level with Min A verbal cues to achieve ~75% intelligibility.  Skilled Therapeutic Interventions: Patient received skilled SLP services targeting speech and swallowing goals. Patient completed PO trials of regular solids demonstrating slow but functional mastication. Mild lingual residues were observed which patient sensed and cleared with a thin liquid wash. No overt s/sx of aspiration were observed with thin liquids or regular solids. Patient independently recalled speech intelligibility strategies including slow, loud, over-articulate, and pause. Patient participated in a functional reading task targeting speech intelligibility with patient reading a restaurant menu out loud. Patient was ~75% intelligible during restaurant menu task requiring mod verbal cues to over-articulate. Patient repeated alliterative phrases x10 after clinician requiring mod verbal cues to slow rate of speech and over-articulate to bring listener understanding to ~90%. At the end of therapy session patient upright in chair, chair alarm activated, and all needs within reach.  Pain Pain Assessment Pain Scale: 0-10 Pain Score: 0-No pain  Therapy/Group: Individual Therapy  Cristy Folks 06/23/2019, 11:37 AM

## 2019-06-23 NOTE — Progress Notes (Signed)
Occupational Therapy Session Note  Patient Details  Name: Megan Martinez MRN: [id number] Date of Birth: 1951-02-27  Today's Date: 06/23/2019 OT Individual Time: 7530-0511 OT Individual Time Calculation (min): 75 min (unattended estim 1045- 1130)   Short Term Goals: Week 1:  OT Short Term Goal 1 (Week 1): Pt will peform toileting with min A with extra time OT Short Term Goal 2 (Week 1): Pt will perform LB dressing with mod A including footware OT Short Term Goal 3 (Week 1): Pt will perform one grooming task in standing with min A OT Short Term Goal 4 (Week 1): Pt will perform bed mobility in prep for ADL with min A wtih cues   Skilled Therapeutic Interventions/Progress Updates:    Pt seen for BADL training with a focus on adaptive strategies and balance.  Pt received in wc and taken to bathroom to undress and prepare for shower.  Min A to undress and stand pivot into shower onto bench with grab bar.  Bathed LB from sitting using lateral lean.  Min A to wash LUE.   Mod A with bra and min shirt.  Donned pants with CGA and shoes mod a.  Taken to gym to focus on RUE NMR.  Worked on scapular movements and strength with rolling ball forward and back with forearm on ball, bilateral ball holds with full A to support R hand on ball. She has more scapular elevation motion today but continues to be flaccid elsewhere.  FES with saebo stim one: Wrist and finger extension for 15 min with grasp/ release exercises of pulling and pushing away handle on bar stool  Elbow extension for 10 min with hand on UE ranger and pushing ranger forward on on phase with guided A.  Cyclic estim: R deltoid for unattended estim for 45 min.  Pt is competent to turn it down or off on her own if needed.   Placed on shoulder and then pt self propelled w/c back to room. Donned safety alarm belt on pt.  Returned to room 45 min later to doff estim. Pt tolerated well.   Lap tray, wrist cock up splint on. Pt in room with all  needs met.   Therapy Documentation Precautions:  Precautions Precautions: Fall Precaution Comments: right hemiparesis Restrictions Weight Bearing Restrictions: No Other Position/Activity Restrictions: RLE ace wrapped into dorsiflexion.      Pain: Pain Assessment Pain Scale: 0-10 Pain Score: 0-No pain ADL: ADL Eating: Minimal assistance Grooming: Minimal assistance Upper Body Bathing: Minimal assistance Where Assessed-Upper Body Bathing: Shower Lower Body Bathing: Moderate assistance Where Assessed-Lower Body Bathing: Shower Upper Body Dressing: Moderate assistance Lower Body Dressing: Moderate assistance Where Assessed-Lower Body Dressing: Wheelchair Toileting: Moderate assistance Toilet Transfer: Moderate assistance Tub/Shower Transfer Method: Stand pivot Tub/Shower Equipment: Walk in Retail buyer: Moderate assistance   Therapy/Group: Individual Therapy  Hughes 06/23/2019, 12:44 PM

## 2019-06-24 ENCOUNTER — Inpatient Hospital Stay (HOSPITAL_COMMUNITY): Payer: PPO | Admitting: Speech Pathology

## 2019-06-24 NOTE — Care Management (Signed)
[facility] Individual Statement of Services  Patient Name:  Megan Martinez  Date:  06/22/2019  Welcome to the [facility].  Our goal is to provide you with an individualized program based on your diagnosis and situation, designed to meet your specific needs.  With this comprehensive rehabilitation program, you will be expected to participate in at least 3 hours of rehabilitation therapies Monday-Friday, with modified therapy programming on the weekends.  Your rehabilitation program will include the following services:  Physical Therapy (PT), Occupational Therapy (OT), Speech Therapy (ST), 24 hour per day rehabilitation nursing, Therapeutic Recreaction (TR), Neuropsychology, Case Management (Social Worker), Rehabilitation Medicine, Nutrition Services and Pharmacy Services  Weekly team conferences will be held on Wednesdays to discuss your progress.  Your Social Worker will talk with you frequently to get your input and to update you on team discussions.  Team conferences with you and your family in attendance may also be held.  Expected length of stay: 10-14 days   Overall anticipated outcome: supervision  Depending on your progress and recovery, your program may change. Your Social Worker will coordinate services and will keep you informed of any changes. Your Social Worker's name and contact numbers are listed  below.  The following services may also be recommended but are not provided by the [facility] will be made to provide these services after discharge if needed.  Arrangements include referral to agencies that provide these services.  Your insurance has been verified to be:  [employer] Your primary doctor is:  Zigmund Daniel  Pertinent information will be shared with your  doctor and your insurance company.  Social Worker:  [place], [place] or ([phone]   Information discussed with and copy given to patient by: [name], 06/22/2019, 1:42 PM

## 2019-06-24 NOTE — Progress Notes (Signed)
Social Work Assessment and Plan   Patient Details  Name: Megan Martinez MRN: [id number] Date of Birth: 1951/03/06  Today's Date: 06/22/2019  Problem List:  Patient Active Problem List   Diagnosis Date Noted  . Transaminitis   . Hypoalbuminemia due to protein-calorie malnutrition ([place])   . Labile blood pressure   . Left pontine cerebrovascular accident ([place]) 06/20/2019  . Acute ischemic stroke ([place])   . Right hemiparesis ([place])   . Dysphagia, post-stroke   . 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 Stroke (cerebrum) ([place]) L pontine d/t BA stenosis 06/14/2019  . Subcutaneous nodule of left foot 01/03/2019  . Atopic dermatitis 10/18/2018  . Seasonal allergies 08/09/2018  . 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 Osteoarthritis of carpometacarpal (CMC) joint of thumb 11/07/2017  . Acquired hypothyroidism 08/04/2015  . Essential hypertension 08/04/2015  . GERD without esophagitis 08/04/2015  . Mixed hyperlipidemia 08/04/2015  . Osteopenia 08/04/2015  . Primary insomnia 08/04/2015  . Pure hypertriglyceridemia 08/04/2015   Past Medical History:  Past Medical History:  Diagnosis Date  . Allergy   . Arthritis   . Hypertension   . Thyroid disease    Past Surgical History:  Past Surgical History:  Procedure Laterality Date  . ABDOMINAL HYSTERECTOMY    . APPENDECTOMY    . CATARACT EXTRACTION    . CHOLECYSTECTOMY    . TUBAL LIGATION     Social History:  reports that she quit smoking about 51 years ago. She has never used smokeless tobacco. She reports current alcohol use. She reports that she does not use drugs.  Family / Support Systems Marital Status: Single Patient Roles: Partner, Parent Spouse/Significant Other: boyfriend, Megan Martinez @ [phone] Children: daughter, Megan Martinez Anticipated Caregiver: Megan Martinez and Megan Martinez Ability/Limitations of Caregiver: none Caregiver Availability: 24/7 Family Dynamics:  Patient reports that her granddaughter, is very supportive and will be available assist.  Patient is her, Megan Martinez, is also available to assist intermittently  Social History Preferred language: English Religion:  Cultural Background: NA Read: Yes Write: Yes Employment Status: Employed Public relations account executive Issues: None Guardian/Conservator: None - per MD, pt is capable of making decisions on her own behalf.   Abuse/Neglect Abuse/Neglect Assessment Can Be Completed: Yes Physical Abuse: Denies Verbal Abuse: Denies Sexual Abuse: Denies Exploitation of patient/patient's resources: Denies Self-Neglect: Denies  Emotional Status Pt's affect, behavior and adjustment status: Patient sitting up in wheelchair and completes assessment interview without any difficulty.  Patient talks openly with me about her feelings of loss of independence.  Becomes a little tearful as she notes, "I've always been very indepednent.  This is completely different.  I really want to get back to where I was."  Pt does appear very motivated for CIR.  Will refer for neuropsychology support while here. Recent Psychosocial Issues: None Psychiatric History: None Substance Abuse History: None  Patient / Family Perceptions, Expectations & Goals Pt/Family understanding of illness & functional limitations: Pt and family with good, general understanding of her CVA, likely causes and of current functional limitations/ need for CIR. Premorbid pt/family roles/activities: Patient lives alone and was completely independent PTA.  Notes that she had retired for a short period of time and then returned to working 2 part-time positions, "I just could not stand sitting around." Anticipated changes in roles/activities/participation: Anticipate boyfriend and daughter to provide caregiver support. Pt/family expectations/goals: Patient very hopeful she may able to return to one of her part-time positions.  [employer]  Agencies: None Premorbid Home Care/DME Agencies: None Transportation available at discharge: yes Resource referrals recommended: Neuropsychology  Discharge Planning Living Arrangements: Spouse/significant other Support Systems: Children, Spouse/significant other Type of Residence: Private residence Financial Resources: Employment Financial Screen Referred: No Money Management: Patient Does the patient have any problems obtaining your medications?: No Home Management: pt Patient/Family Preliminary Plans: Pt to return to her home with assistance from her daughter and boyfriend. Social Work Anticipated Follow Up Needs: HH/OP Expected length of stay: 10-14 days  Clinical Impression Unfortunate woman here following a CVA and with notable right hemiplegia and some speech impairment.  She does not, talkative and speaks openly about her frustration with loss of independence.  She does note good support from boyfriend and adult daughter.  Denies any concerns about assistance at discharge.  Little tearful at times we talk about her stroke and will plan to refer for neuropsychology consult for additional support.  Megan Martinez 06/22/2019, 1:37 PM

## 2019-06-24 NOTE — Progress Notes (Signed)
Speech Language Pathology Daily Session Note  Patient Details  Name: HANNYA KETCH MRN: [id number] Date of Birth: [birth year]  Today's Date: 06/24/2019 SLP Individual Time: [id number] SLP Individual Time Calculation (min): 44 min  Short Term Goals: Week 1: SLP Short Term Goal 1 (Week 1): Patient will consume trials of regular textures with efficient mastication and complete oral clearance without overt s/s of aspiration with supervision level verbal cues over 2 sessions prior to upgrade. SLP Short Term Goal 2 (Week 1): Patient will consume current diet with minimal overt s/s of aspiration with Mod I for use of swallowing compensatory strategies. SLP Short Term Goal 3 (Week 1): Patient will utilize speech intelligibility strategies at the phrase level with Min A verbal cues to achieve ~75% intelligibility.  Skilled Therapeutic Interventions: Pt was seen for skilled ST targeting dysphagia and cognitive goals. SLP facilitated of upgraded regular texture solid trial, during which pt demonstrated prolonged but functional mastication, and total oral clearance with use of thin liquid wash. Pt also required Supervision A verbal cues to implement chin tuck with thin H2O throughout intake. Pt reported lack of appetite and therefore only consumed 1/3 of regular texture snack. Recommend additional trial with pt's primary SLP prior to full upgrade. Continue current diet. SLP also facilitated session with a sentence level barrier speech task to assess carryover of speech intelligibility strategies. Pt Mod I for recall of speech strategies (and noted emphasis on need to focus on slow rate). She used said strategies to achieve ~85% intelligibility with Min A verbal cues for repetition using slow rate. Pt aware of and self correct ~50% verbal errors in moments of unintelligibility. Pt left sitting in wheelchair with alarm set and needs within reach. Continue per current plan of care.       Pain Pain  Assessment Pain Scale: 0-10 Pain Score: 0-No pain  Therapy/Group: Individual Therapy  Arbutus Leas 06/24/2019, 2:53 PM

## 2019-06-24 NOTE — Progress Notes (Signed)
[facility] PHYSICAL MEDICINE & REHABILITATION PROGRESS NOTE  Subjective/Complaints: Mrs. Burks has some cough this morning. She is sitting up in chair awaiting therapies. Denies pain Denies constipation.  ROS: Denies CP, shortness of breath, nausea, vomiting, diarrhea.  Objective: Vital Signs: Blood pressure (!) 136/58, pulse (!) 48, temperature 98.1 F (36.7 C), temperature source Oral, resp. rate 18, height 5\' 2"  (1.575 m), weight 72.8 kg, SpO2 95 %. No results found. No results for input(s): WBC, HGB, HCT, PLT in the last 72 hours. No results for input(s): NA, K, CL, CO2, GLUCOSE, BUN, CREATININE, CALCIUM in the last 72 hours.  Physical Exam: BP (!) 136/58 (BP Location: Left Arm)   Pulse (!) 48   Temp 98.1 F (36.7 C) (Oral)   Resp 18   Ht 5\' 2"  (1.575 m)   Wt 72.8 kg   SpO2 95%   BMI 29.34 kg/m  Constitutional: No distress . Vital signs reviewed. Sitting up in chair watching TV; ate some of breakfast HENT: Normocephalic.  Atraumatic. Eyes: EOMI. No discharge. Cardiovascular: No JVD. Respiratory: Normal effort.  No stridor. +dry cough GI: Non-distended. Skin: Warm and dry.  Intact. Psych: Normal mood.  Normal behavior. Very pleasant Musc: No edema in extremities.  No tenderness in extremities. Neurological: Alert Dysarthria, unchanged She makes eye contact with examiner and follows simple commands.   Appears to have fair insight and awareness. Motor: RUE: 0/5 proximal and distal, stable RLE: Hip flexion, knee extension 4/5, ankle dorsiflexion 0/5, stable LUE: 5/5 proximal distal LLE: 5/5 proximal distal  Assessment/Plan: 1. Functional deficits secondary to left pontine infarct which require 3+ hours per day of interdisciplinary therapy in a comprehensive inpatient rehab setting.  Physiatrist is providing close team supervision and 24 hour management of active medical problems listed below.  Physiatrist and rehab team continue to assess barriers to  discharge/monitor patient progress toward functional and medical goals  Care Tool:  Bathing    Body parts bathed by patient: Right arm, Chest, Abdomen, Front perineal area, Buttocks, Right upper leg, Left upper leg, Right lower leg, Left lower leg, Face   Body parts bathed by helper: Left arm     Bathing assist Assist Level: Minimal Assistance - Patient > 75%     Upper Body Dressing/Undressing Upper body dressing   What is the patient wearing?: Pull over shirt, Bra    Upper body assist Assist Level: Moderate Assistance - Patient 50 - 74%    Lower Body Dressing/Undressing Lower body dressing    Lower body dressing activity did not occur: Safety/medical concerns What is the patient wearing?: Pants, Underwear/pull up     Lower body assist Assist for lower body dressing: Minimal Assistance - Patient > 75%     Toileting Toileting    Toileting assist Assist for toileting: Maximal Assistance - Patient 25 - 49%     Transfers Chair/bed transfer  Transfers assist  Chair/bed transfer activity did not occur: N/A  Chair/bed transfer assist level: Minimal Assistance - Patient > 75%     Locomotion Ambulation   Ambulation assist      Assist level: Minimal Assistance - Patient > 75% Assistive device: Walker-rolling Max distance: 39ft   Walk 10 feet activity   Assist     Assist level: Minimal Assistance - Patient > 75% Assistive device: Walker-rolling   Walk 50 feet activity   Assist Walk 50 feet with 2 turns activity did not occur: Safety/medical concerns(R hemi, decreased strength/balance)  Assist level: Minimal Assistance - Patient >  75% Assistive device: Walker-rolling    Walk 150 feet activity   Assist Walk 150 feet activity did not occur: Safety/medical concerns(R hemi, decreased strength/balance)         Walk 10 feet on uneven surface  activity   Assist Walk 10 feet on uneven surfaces activity did not occur: Safety/medical concerns(R hemi,  decreased strength/balance)         Wheelchair     Assist   Type of Wheelchair: Manual    Wheelchair assist level: Contact Guard/Touching assist Max wheelchair distance: 5'    Wheelchair 50 feet with 2 turns activity    Assist    Wheelchair 50 feet with 2 turns activity did not occur: Safety/medical concerns(R hemi, required skilled interventions for safe technique)       Wheelchair 150 feet activity     Assist Wheelchair 150 feet activity did not occur: Safety/medical concerns(R hemi, required skilled interventions for safe technique)          Medical Problem List and Plan: 1.  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  PLAN BASILAR ARTERY STENTING AFTER REHAB PROGRAM completed per interventional radiology  Cont CIR   WHO/PRAFO qhs  1/30: Reviewed therapy notes. Participated well with SLP today, 75% intelligibility during task. Min to Mod A with OT in ADLs.  2.  Antithrombotics: -DVT/anticoagulation: Lovenox   CBC within normal limits on 1/28             -antiplatelet therapy: Aspirin 325 mg daily, Plavix 75 mg daily x3 months then Plavix alone 3. Pain Management: Tylenol as needed  1/30, 1/31: No pain 4. Mood: Provide emotional support             -antipsychotic agents: N/A 5. Neuropsych: This patient is capable of making decisions on her own behalf. 6. Skin/Wound Care: Routine skin checks 7. Fluids/Electrolytes/Nutrition: Routine in and outs 8.  Essential hypertension: Patient on Norvasc 10 mg daily and cadestran-HCTZ 32-25 mg daily prior to admission.  Resume as needed             1/30, 1/31: Flowsheet reviewed; well controlled today.   Monitor with increased mobility 9.  Hypothyroidism.  Continue Synthroid 10.  Hyperlipidemia: Continue Lipitor 11.  Post stroke dysphagia             D3 thins, advance diet as tolerated 12.  Transaminitis  LFTs elevated on  1/28, labs ordered for Monday 13.  Hypoalbuminemia  Supplement initiated on 1/28  LOS: 4 days A FACE TO FACE EVALUATION WAS PERFORMED  Clide Deutscher Cherice Glennie 06/24/2019, 10:53 AM

## 2019-06-25 ENCOUNTER — Inpatient Hospital Stay (HOSPITAL_COMMUNITY): Payer: PPO | Admitting: Speech Pathology

## 2019-06-25 ENCOUNTER — Inpatient Hospital Stay (HOSPITAL_COMMUNITY): Payer: PPO | Admitting: Occupational Therapy

## 2019-06-25 ENCOUNTER — Inpatient Hospital Stay (HOSPITAL_COMMUNITY): Payer: PPO

## 2019-06-25 DIAGNOSIS — R7303 Prediabetes: Secondary | ICD-10-CM

## 2019-06-25 DIAGNOSIS — G8191 Hemiplegia, unspecified affecting right dominant side: Secondary | ICD-10-CM

## 2019-06-25 DIAGNOSIS — N182 Chronic kidney disease, stage 2 (mild): Secondary | ICD-10-CM

## 2019-06-25 LAB — COMPREHENSIVE METABOLIC PANEL
ALT: 36 U/L (ref 0–44)
AST: 25 U/L (ref 15–41)
Albumin: 3.5 g/dL (ref 3.5–5.0)
Alkaline Phosphatase: 31 U/L — ABNORMAL LOW (ref 38–126)
Anion gap: 12 (ref 5–15)
BUN: 14 mg/dL (ref 8–23)
CO2: 26 mmol/L (ref 22–32)
Calcium: 9.4 mg/dL (ref 8.9–10.3)
Chloride: 106 mmol/L (ref 98–111)
Creatinine, Ser: 0.98 mg/dL (ref 0.44–1.00)
GFR calc Af Amer: >60 mL/min (ref >60)
GFR calc non Af Amer: 59 mL/min — ABNORMAL LOW (ref >60)
Glucose, Bld: 111 mg/dL — ABNORMAL HIGH (ref 70–99)
Potassium: 3.7 mmol/L (ref 3.5–5.1)
Sodium: 144 mmol/L (ref 135–145)
Total Bilirubin: 0.9 mg/dL (ref 0.3–1.2)
Total Protein: 6 g/dL — ABNORMAL LOW (ref 6.5–8.1)

## 2019-06-25 NOTE — Progress Notes (Signed)
Occupational Therapy Session Note  Patient Details  Name: Megan Martinez MRN: [id number] Date of Birth: November 17, 1950  Today's Date: 06/25/2019 OT Individual Time: 1045-1200 OT Individual Time Calculation (min): 75 min (unattended estim 1200-1245)   Short Term Goals: Week 1:  OT Short Term Goal 1 (Week 1): Pt will peform toileting with min A with extra time OT Short Term Goal 2 (Week 1): Pt will perform LB dressing with mod A including footware OT Short Term Goal 3 (Week 1): Pt will perform one grooming task in standing with min A OT Short Term Goal 4 (Week 1): Pt will perform bed mobility in prep for ADL with min A wtih cues  Skilled Therapeutic Interventions/Progress Updates:    Pt seen for ADL training with a focus on standing balance at sink and in shower, adaptive techniques using long sponge in shower and with dressing.  See ADL documentation below. Pt is learning new strategies quickly.  FES for 30 min with 15 for wrist and finger extension with grasp release exercises on dowel bar, 15 min on triceps with push/pull exercises on UE ranger.  Pt tolerated well.  Cyclic estim on R deltoids for 40 min total for shoulder activation. Pt tolerated well. Saebo Stim One 330 pulse width 35 Hz pulse rate On 8 sec/ off 8 sec Ramp up/ down 2 sec Symmetrical Biphasic wave form  Max intensity 126m at 500 Ohm load  Pt resting in w/c with belt alarm on and all needs met.   Therapy Documentation Precautions:  Precautions Precautions: Fall Precaution Comments: right hemiparesis Restrictions Weight Bearing Restrictions: No Other Position/Activity Restrictions: RLE ace wrapped into dorsiflexion.      Pain: no c/o pain   ADL: ADL Eating: Set up Grooming: Contact guard Where Assessed-Grooming: Standing at sink Upper Body Bathing: Minimal cueing Where Assessed-Upper Body Bathing: Shower Lower Body Bathing: Contact guard Where Assessed-Lower Body Bathing: Shower Upper Body Dressing:  Minimal assistance Where Assessed-Upper Body Dressing: Wheelchair Lower Body Dressing: Moderate assistance Where Assessed-Lower Body Dressing: Wheelchair Toileting: Moderate assistance Toilet Transfer: Moderate assistance Tub/Shower Transfer Method: Stand pivot Tub/Shower Equipment: Walk in sRetail buyer MEnvironmental education officerMethod: Stand pivot   Therapy/Group: Individual Therapy  SChildress2/05/2019, 12:36 PM

## 2019-06-25 NOTE — Progress Notes (Signed)
Physical Therapy Session Note  Patient Details  Name: Megan Martinez MRN: [id number] Date of Birth: [birth year]  Today's Date: 06/25/2019 PT Individual Time: 0905-1005 PT Individual Time Calculation (min): 60 min   Short Term Goals: Week 1:  PT Short Term Goal 1 (Week 1): Patient will perform basic transfers with close supervision using LRAD. PT Short Term Goal 2 (Week 1): Patient will ambulate >100 feet using LRAD with CGA. PT Short Term Goal 3 (Week 1): Patient will perform dynamic standing balance activities with CGA without UE support.  Skilled Therapeutic Interventions/Progress Updates:    Session focused on NMR for gait training on Lite Gait, sit <> stands, and standing balance to address postural control, RLE coordination and motor control, and balance. Pt requires CGA for sit <> stand with cues for technique and hand placement. Maintains standing balance with CGA while donning and doffing sling. Mod assist for 1 step up/down to get on and off treadmill and cues for which foot to lead with.    Lite Gait: RLE with ACE wrap (RUE ACE wrapped for weightbearing/feedback) with minimal assistance for advancement of RLE and blocking knee hyperextension Trial 1: 4:21; (3 min at 0.4 mph; 1:20 at 0.5 mph); 157' Trial 2: 3:50; (3 min at 0.4 mph; 50 sec at 0.5 mph); 142' Trial 3: 6 min @ 0.4 mph; 211'  Pt performs hemi technique w/c propulsion to and from therapy gym at supervision level for pathfinding.   Therapy Documentation Precautions:  Precautions Precautions: Fall Precaution Comments: right hemiparesis Restrictions Weight Bearing Restrictions: No Other Position/Activity Restrictions: RLE ace wrapped into dorsiflexion.  Pain:  Denies pain.    Therapy/Group: Individual Therapy  Canary Brim Ivory Broad, PT, DPT, CBIS  06/25/2019, 10:54 AM

## 2019-06-25 NOTE — Progress Notes (Signed)
[place] PHYSICAL MEDICINE & REHABILITATION PROGRESS NOTE  Subjective/Complaints: Patient seen sitting up in her chair this morning.  She states she slept well overnight.  She states she had a "boring" weekend.  ROS: Denies CP, shortness of breath, nausea, vomiting, diarrhea.  Objective: Vital Signs: Blood pressure (!) 136/52, pulse (!) 50, temperature 98.4 F (36.9 C), temperature source Oral, resp. rate 18, height 5\' 2"  (1.575 m), weight 72.8 kg, SpO2 95 %. No results found. No results for input(s): WBC, HGB, HCT, PLT in the last 72 hours. Recent Labs    06/25/19 0556  NA 144  K 3.7  CL 106  CO2 26  GLUCOSE 111*  BUN 14  CREATININE 0.98  CALCIUM 9.4    Physical Exam: BP (!) 136/52 (BP Location: Left Arm)   Pulse (!) 50   Temp 98.4 F (36.9 C) (Oral)   Resp 18   Ht 5\' 2"  (1.575 m)   Wt 72.8 kg   SpO2 95%   BMI 29.34 kg/m  Constitutional: No distress . Vital signs reviewed. HENT: Normocephalic.  Atraumatic. Eyes: EOMI. No discharge. Cardiovascular: No JVD. Respiratory: Normal effort.  No stridor. GI: Non-distended. Skin: Warm and dry.  Intact. Psych: Normal mood.  Normal behavior. Musc: No edema in extremities.  No tenderness in extremities. Neurological: Alert Dysarthria, stable She makes eye contact with examiner and follows simple commands.   Appears to have fair insight and awareness. Motor: RUE: 0/5 proximal and distal, unchanged RLE: Hip flexion, knee extension 4/5, ankle dorsiflexion 0/5, unchanged.   No increase in tone  Assessment/Plan: 1. Functional deficits secondary to left pontine infarct which require 3+ hours per day of interdisciplinary therapy in a comprehensive inpatient rehab setting.  Physiatrist is providing close team supervision and 24 hour management of active medical problems listed below.  Physiatrist and rehab team continue to assess barriers to discharge/monitor patient progress toward functional and medical goals  Care  Tool:  Bathing    Body parts bathed by patient: Right arm, Chest, Abdomen, Front perineal area, Buttocks, Right upper leg, Left upper leg, Right lower leg, Left lower leg, Face   Body parts bathed by helper: Left arm     Bathing assist Assist Level: Minimal Assistance - Patient > 75%     Upper Body Dressing/Undressing Upper body dressing   What is the patient wearing?: Pull over shirt, Bra    Upper body assist Assist Level: Moderate Assistance - Patient 50 - 74%    Lower Body Dressing/Undressing Lower body dressing    Lower body dressing activity did not occur: Safety/medical concerns What is the patient wearing?: Pants, Underwear/pull up     Lower body assist Assist for lower body dressing: Minimal Assistance - Patient > 75%     Toileting Toileting    Toileting assist Assist for toileting: Maximal Assistance - Patient 25 - 49%     Transfers Chair/bed transfer  Transfers assist  Chair/bed transfer activity did not occur: N/A  Chair/bed transfer assist level: Minimal Assistance - Patient > 75%     Locomotion Ambulation   Ambulation assist      Assist level: Minimal Assistance - Patient > 75% Assistive device: Walker-rolling Max distance: 41ft   Walk 10 feet activity   Assist     Assist level: Minimal Assistance - Patient > 75% Assistive device: Walker-rolling   Walk 50 feet activity   Assist Walk 50 feet with 2 turns activity did not occur: Safety/medical concerns(R hemi, decreased strength/balance)  Assist level:  Minimal Assistance - Patient > 75% Assistive device: Walker-rolling    Walk 150 feet activity   Assist Walk 150 feet activity did not occur: Safety/medical concerns(R hemi, decreased strength/balance)         Walk 10 feet on uneven surface  activity   Assist Walk 10 feet on uneven surfaces activity did not occur: Safety/medical concerns(R hemi, decreased strength/balance)         Wheelchair     Assist   Type of  Wheelchair: Manual    Wheelchair assist level: Contact Guard/Touching assist Max wheelchair distance: 5'    Wheelchair 50 feet with 2 turns activity    Assist    Wheelchair 50 feet with 2 turns activity did not occur: Safety/medical concerns(R hemi, required skilled interventions for safe technique)       Wheelchair 150 feet activity     Assist Wheelchair 150 feet activity did not occur: Safety/medical concerns(R hemi, required skilled interventions for safe technique)          Medical Problem List and Plan: 1.  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  PLAN BASILAR ARTERY STENTING AFTER REHAB PROGRAM completed per interventional radiology  Continue CIR   WHO/PRAFO qhs 2.  Antithrombotics: -DVT/anticoagulation: Lovenox   CBC within normal limits on 1/28             -antiplatelet therapy: Aspirin 325 mg daily, Plavix 75 mg daily x3 months then Plavix alone 3. Pain Management: Tylenol as needed 4. Mood: Provide emotional support             -antipsychotic agents: N/A 5. Neuropsych: This patient is capable of making decisions on her own behalf. 6. Skin/Wound Care: Routine skin checks 7. Fluids/Electrolytes/Nutrition: Routine in and outs 8.  Essential hypertension: Patient on Norvasc 10 mg daily and cadestran-HCTZ 32-25 mg daily prior to admission.  Resume as needed             Controlled on 2/1  Monitor with increased mobility 9.  Hypothyroidism.  Continue Synthroid 10.  Hyperlipidemia: Continue Lipitor 11.  Post stroke dysphagia             D3 thins, advance diet as tolerated 12.  Transaminitis: Resolved  LFTs within normal limits on 2/1 13.  Hypoalbuminemia  Supplement initiated on 1/28 14.  Prediabetes  Slightly elevated on 2/1  Monitor with increased mobility 15.?  Mild AKI versus CKD  GFR 59 on 2/1  Continue to monitor   LOS: 5 days A FACE TO FACE EVALUATION  WAS PERFORMED  Megan Martinez Megan Martinez 06/25/2019, 8:18 AM

## 2019-06-25 NOTE — Progress Notes (Signed)
Speech Language Pathology Daily Session Note  Patient Details  Name: Megan Martinez MRN: [id number] Date of Birth: [birth year]  Today's Date: 06/25/2019 SLP Individual Time: 1340-1425 SLP Individual Time Calculation (min): 45 min  Short Term Goals: Week 1: SLP Short Term Goal 1 (Week 1): Patient will consume trials of regular textures with efficient mastication and complete oral clearance without overt s/s of aspiration with supervision level verbal cues over 2 sessions prior to upgrade. SLP Short Term Goal 2 (Week 1): Patient will consume current diet with minimal overt s/s of aspiration with Mod I for use of swallowing compensatory strategies. SLP Short Term Goal 3 (Week 1): Patient will utilize speech intelligibility strategies at the phrase level with Min A verbal cues to achieve ~75% intelligibility.  Skilled Therapeutic Interventions: Pt was seen for skilled ST targeting dysphagia and speech goals. SLP facilitated session with upgraded trials of regular texture solids and thin liquids. Pt demonstrated efficient mastication and complete oral clearance of solids with use of lingual sweep and liquid wash to clear trace residue Mod I. She required Supervision A verbal cues to implement chin tuck strategy during thin consumption. No immediate overt s/sx aspiration observed throughout intake, however 1 instance of delayed coughing noted later (>10 minutes post PO consumption). Recommend upgrade to regular texture diet, continue with thin liquids, meds may be whole in puree. Pt would benefit from repeat MBSS to determine readiness to fade chin tuck with thin given that last MBSS noted some silent aspiration, this would not be appropriate to be determined at bedside (and therefore requires repeat instrumental assessment). Will schedule at earliest date. During sentence level barrier speech tasks incorporating 4 and 5 syllable words, pt was ~90% intelligible and Supervision A for use of slow rate and  overarticulation. During conversational level speech tasks with topics unknown to SLP, pt required increased Min A verbal cues for repeating messages with use of speech strategies in order to achieve 80% intelligibility. Pt left sitting in wheelchair with alarm set and needs within reach. Continue per current plan of care.       Pain Pain Assessment Pain Scale: 0-10 Pain Score: 2  Pain Type: Acute pain Pain Location: Shoulder Pain Orientation: Right Pain Descriptors / Indicators: Sore Pain Onset: With Activity(from earlier PT session) Pain Intervention(s): Distraction;Emotional support Multiple Pain Sites: No  Therapy/Group: Individual Therapy  Arbutus Leas 06/25/2019, 3:08 PM

## 2019-06-26 ENCOUNTER — Inpatient Hospital Stay (HOSPITAL_COMMUNITY): Payer: PPO

## 2019-06-26 ENCOUNTER — Encounter (HOSPITAL_COMMUNITY): Payer: PPO | Admitting: Psychology

## 2019-06-26 ENCOUNTER — Inpatient Hospital Stay (HOSPITAL_COMMUNITY): Payer: PPO | Admitting: Occupational Therapy

## 2019-06-26 ENCOUNTER — Inpatient Hospital Stay (HOSPITAL_COMMUNITY): Payer: PPO | Admitting: Speech Pathology

## 2019-06-26 NOTE — Consult Note (Signed)
Neuropsychological Consultation   Patient:   Megan Martinez   DOB:   11-19-1950  MR Number:  [id number]  Location:  [facility] [address] C[facility] [address] [place] [id number] [place] [address] Dept: [place]: [phone]           Date of Service:   06/26/19  Start Time:   12:30 End Time:   1:30 PM  Provider/Observer:  Ilean Skill, Psy.D.       Clinical Neuropsychologist       Billing Code/Service: [id number]  Chief Complaint:    Lia Shubin is a 69 year old female with history of hypertension.  Presented 06/14/2019 with right-sided weakness and dysarthria.  Cranial CT was unremarkable for acute process.  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  The patient did receive TPA.  During work-up there was a moderate proximal PCA stenosis bilaterally and interventional radiology was consulted in regards to basilar artery stenosis and plan is to receive inpatient rehab services and then stenting to be completed after rehab program completed.  Reason for Service:  The patient was referred for neuropsychological consultation due to coping and adjustment issues with long hospital stay.  Below is the HPI for the current admission.  HPI: Megan Martinez is a 69 year old right-handed female with history of hypertension.  History taken from chart review and patient. 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  Presented 06/14/2019 with right-sided weakness and dysarthria.  Cranial CT unremarkable for acute intracranial process.  Patient did receive TPA.  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  Moderate proximal PCA stenosis bilaterally.  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  MRI showed acute to subacute nonhemorrhagic  left paramedian pontine ischemic infarction.  Echocardiogram with ejection fraction of 65% without emboli.  Admission chemistries with potassium 3.0, urine drug screen negative.  Neurology follow-up maintained on aspirin and Plavix for CVA prophylaxis x3 months then Plavix alone.  Subcutaneous Lovenox for DVT prophylaxis.  Tolerating mechanical soft diet.  Maintain on Cardene drip initially for blood pressure control.  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  Therapy evaluations completed and patient was admitted for a comprehensive rehab program.  Current Status:  The patient was well oriented today and described good mood other than being very frustrated with extended hospital stay and anxious to get home as soon as she can to be able to be able to help take care of her dogs and cats.  The patient's boyfriend, who is much older than her, has been taking care of her animals while she is in the hospital but she is anxious as he does not follow her schedule and protocols and this is an important part of her life.  The patient lives alone but her boyfriend lives in the house next-door.  The patient denies any significant depressive symptomatology.  The patient does continue with motor deficits consistent with pontine ischemic infarction.  The patient continues with right-sided motor deficits that are affecting her whole right side and also producing continued dysarthria.  However, the patient is continuing to improve with therapies and is identifying benefit from PT/OT and speech therapies.  Behavioral Observation: [name]  presents as a 69 y.o.-year-old Right Caucasian Female who appeared her stated age. her dress was Appropriate and she was Well Groomed and her manners were Appropriate to the situation.  her participation was indicative of Appropriate and  Attentive behaviors.  There were any physical disabilities noted.  she displayed an appropriate level of cooperation and motivation.     Interactions:    Active Appropriate and Attentive  Attention:   within normal limits and attention span and concentration were age appropriate  Memory:   within normal limits; recent and remote memory intact  Visuo-spatial:  not examined  Speech (Volume):  normal  Speech:   normal; slurred due to specific motor deficits for the right side motor deficits.  Thought Process:  Coherent and Relevant  Though Content:  WNL; not suicidal and not homicidal  Orientation:   person, place, time/date and situation  Judgment:   Good  Planning:   Fair  Affect:    Appropriate and Irritable  Mood:    Dysphoric  Insight:   Good  Medical History:   Past Medical History:  Diagnosis Date  . Allergy   . Arthritis   . Hypertension   . Thyroid disease         Psychiatric History:  No prior psychiatric history  Family Med/Psych History: No family history on file.  Impression/DX:  Megan Martinez is a 69 year old female with history of hypertension.  Presented 06/14/2019 with right-sided weakness and dysarthria.  Cranial CT was unremarkable for acute process.  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  The patient did receive TPA.  During work-up there was a moderate proximal PCA stenosis bilaterally and interventional radiology was consulted in regards to basilar artery stenosis and plan is to receive inpatient rehab services and then stenting to be completed after rehab program completed.  The patient was well oriented today and described good mood other than being very frustrated with extended hospital stay and anxious to get home as soon as she can to be able to be able to help take care of her dogs and cats.  The patient's boyfriend, who is much older than her, has been taking care of her animals while she is in  the hospital but she is anxious as he does not follow her schedule and protocols and this is an important part of her life.  The patient lives alone but her boyfriend lives in the house next-door.  The patient denies any significant depressive symptomatology.  The patient does continue with motor deficits consistent with pontine ischemic infarction.  The patient continues with right-sided motor deficits that are affecting her whole right side and also producing continued dysarthria.  However, the patient is continuing to improve with therapies and is identifying benefit from PT/OT and speech therapies.  The patient describes her self is very antsy traditionally and that she is always had this urge to be doing something.  The patient has had a couple of times where she has "gotten in trouble" for trying to get up and go to the bathroom on her own without support.  The patient has well aware of the restrictions and fall risk and has reported that she acknowledges this and plans to follow any guidelines around safety issues.  Disposition/Plan:  Today we worked on adjustment coping issues primarily related to extended hospital stay and residual motor deficits.  Diagnosis:    Left pontine cerebrovascular accident [facility]) - Plan: Ambulatory referral to Neurology  Hypokalemia - Plan: potassium chloride SA (KLOR-CON) CR tablet 20 mEq         Electronically Signed   _______________________ Ilean Skill, Psy.D.

## 2019-06-26 NOTE — Progress Notes (Signed)
Pt slept well throughout most of shift. No c/o pain. Pt requested assist to restroom x1. Assisted with repositioning at this time for comfort. No s/s of distress. Bed in lowest position with call bell within reach. Bed alarm on and functioning properly.

## 2019-06-26 NOTE — Progress Notes (Signed)
Occupational Therapy Session Note  Patient Details  Name: Megan Martinez MRN: [id number] Date of Birth: [birth year]  Today's Date: 06/26/2019 OT Individual Time: 0945-1100 OT Individual Time Calculation (min): 75 min    Short Term Goals: Week 1:  OT Short Term Goal 1 (Week 1): Pt will peform toileting with min A with extra time OT Short Term Goal 2 (Week 1): Pt will perform LB dressing with mod A including footware OT Short Term Goal 3 (Week 1): Pt will perform one grooming task in standing with min A OT Short Term Goal 4 (Week 1): Pt will perform bed mobility in prep for ADL with min A wtih cues  Skilled Therapeutic Interventions/Progress Updates:    Pt received in wc ready for therapy. She expressed concern for lack of change in her RUE and RLE. Agreeable to going to gym to focus on NMR. Transferred to mat with min A.   Sitting :  -scapular adduction a/arom -pelvic alignment as pt has a downward tilt to R, worked on core control and alignment -sit to stand 10x without BUE support and min A focusing on controlled eccentric lowering.  Supine: -External rotation stretches with excellent PROM -Placing and holding of arm at 90 degrees of sh flexion with pt able to hold for 2-3 seconds at a time -Punching arm towards ceiling a/arom with improved shoulder activation -tricep drop exercise with pt able to activate triceps to prevent hand from falling and a/arom to push through extension -bridges with B legs -R LE hip flex and knee extension AROM with rolling ball in and out - B knee sways to activate obliques FES: estim placed on forearm for wrist and finger extension Worked on active reaching of RUE with forearm on ball and rolling ball forward during on phase to coordinate with wrist and finger extension and then actively rolling back in Worked on grasp release facilitation with cones with hand over hand guiding.  Saebo Stim One 330 pulse width 35 Hz pulse rate On 8 sec/ off 8  sec Ramp up/ down 2 sec Symmetrical Biphasic wave form  Max intensity 13m at 500 Ohm load  Pt tolerated therapy well.  Transferred back to chair and pt returned to room.  Belt alarm on and all needs met.  Therapy Documentation Precautions:  Precautions Precautions: Fall Precaution Comments: right hemiparesis Restrictions Weight Bearing Restrictions: No Other Position/Activity Restrictions: RLE ace wrapped into dorsiflexion.    Vital Signs: Therapy Vitals Temp: 98 F (36.7 C) Pulse Rate: 64 Resp: 19 BP: (!) 148/77 Patient Position (if appropriate): Sitting Oxygen Therapy SpO2: 93 % O2 Device: Room Air Pain: Pain Assessment Pain Scale: 0-10 Pain Score: 0-No pain Pain Intervention(s): Repositioned;Splinting ADL: ADL Eating: Set up Grooming: Contact guard Where Assessed-Grooming: Standing at sink Upper Body Bathing: Minimal cueing Where Assessed-Upper Body Bathing: Shower Lower Body Bathing: Contact guard Where Assessed-Lower Body Bathing: Shower Upper Body Dressing: Minimal assistance Where Assessed-Upper Body Dressing: Wheelchair Lower Body Dressing: Moderate assistance Where Assessed-Lower Body Dressing: Wheelchair Toileting: Moderate assistance Toilet Transfer: Moderate assistance Tub/Shower Transfer Method: Stand pivot Tub/Shower Equipment: Walk in sRetail buyer MEnvironmental education officerMethod: Stand pivot  Therapy/Group: Individual Therapy  [employer] 06/26/2019, 8:48 AM

## 2019-06-26 NOTE — Progress Notes (Signed)
[place] PHYSICAL MEDICINE & REHABILITATION PROGRESS NOTE  Subjective/Complaints: Patient seen sitting up in her chair this morning.  She states she slept well overnight.  She has questions regarding discharge date.  ROS: Denies CP, shortness of breath, nausea, vomiting, diarrhea.  Objective: Vital Signs: Blood pressure (!) 148/77, pulse 64, temperature 98 F (36.7 C), resp. rate 19, height 5\' 2"  (1.575 m), weight 72.8 kg, SpO2 93 %. No results found. No results for input(s): WBC, HGB, HCT, PLT in the last 72 hours. Recent Labs    06/25/19 0556  NA 144  K 3.7  CL 106  CO2 26  GLUCOSE 111*  BUN 14  CREATININE 0.98  CALCIUM 9.4    Physical Exam: BP (!) 148/77 (BP Location: Left Arm)   Pulse 64   Temp 98 F (36.7 C)   Resp 19   Ht 5\' 2"  (1.575 m)   Wt 72.8 kg   SpO2 93%   BMI 29.34 kg/m  Constitutional: No distress . Vital signs reviewed. HENT: Normocephalic.  Atraumatic. Eyes: EOMI. No discharge. Cardiovascular: No JVD. Respiratory: Normal effort.  No stridor. GI: Non-distended. Skin: Warm and dry.  Intact. Psych: Normal mood.  Normal behavior. Musc: No edema in extremities.  No tenderness in extremities. Neurological: Alert  Dysarthria, unchanged She makes eye contact with examiner and follows simple commands.   Appears to have fair insight and awareness. Motor: RUE: 0/5 proximal and distal, unchanged  RLE: Hip flexion, knee extension 4/5, ankle dorsiflexion 0/5, unchanged.   No increase in tone  Assessment/Plan: 1. Functional deficits secondary to left pontine infarct which require 3+ hours per day of interdisciplinary therapy in a comprehensive inpatient rehab setting.  Physiatrist is providing close team supervision and 24 hour management of active medical problems listed below.  Physiatrist and rehab team continue to assess barriers to discharge/monitor patient progress toward functional and medical goals  Care Tool:  Bathing    Body parts bathed  by patient: Right arm, Chest, Abdomen, Front perineal area, Buttocks, Right upper leg, Left upper leg, Right lower leg, Left lower leg, Face, Left arm(used long sponge)   Body parts bathed by helper: Left arm     Bathing assist Assist Level: Contact Guard/Touching assist     Upper Body Dressing/Undressing Upper body dressing   What is the patient wearing?: Pull over shirt    Upper body assist Assist Level: Minimal Assistance - Patient > 75%    Lower Body Dressing/Undressing Lower body dressing    Lower body dressing activity did not occur: Safety/medical concerns What is the patient wearing?: Pants     Lower body assist Assist for lower body dressing: Minimal Assistance - Patient > 75%     Toileting Toileting    Toileting assist Assist for toileting: Maximal Assistance - Patient 25 - 49%     Transfers Chair/bed transfer  Transfers assist  Chair/bed transfer activity did not occur: Safety/medical concerns  Chair/bed transfer assist level: Minimal Assistance - Patient > 75%     Locomotion Ambulation   Ambulation assist      Assist level: Total Assistance - Patient < 25% Assistive device: Lite Gait Max distance: 211   Walk 10 feet activity   Assist     Assist level: Total Assistance - Patient < 25% Assistive device: Lite Gait   Walk 50 feet activity   Assist Walk 50 feet with 2 turns activity did not occur: Safety/medical concerns(R hemi, decreased strength/balance)  Assist level: Total Assistance - Patient <  25% Assistive device: Lite Gait    Walk 150 feet activity   Assist Walk 150 feet activity did not occur: Safety/medical concerns(R hemi, decreased strength/balance)  Assist level: Total Assistance - Patient < 25% Assistive device: Lite Gait    Walk 10 feet on uneven surface  activity   Assist Walk 10 feet on uneven surfaces activity did not occur: Safety/medical concerns(R hemi, decreased strength/balance)          Wheelchair     Assist Will patient use wheelchair at discharge?: No(no LT goals set for discharge) Type of Wheelchair: Manual    Wheelchair assist level: Contact Guard/Touching assist Max wheelchair distance: 5'    Wheelchair 50 feet with 2 turns activity    Assist    Wheelchair 50 feet with 2 turns activity did not occur: Safety/medical concerns(R hemi, required skilled interventions for safe technique)       Wheelchair 150 feet activity     Assist Wheelchair 150 feet activity did not occur: Safety/medical concerns(R hemi, required skilled interventions for safe technique)          Medical Problem List and Plan: 1.  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  PLAN BASILAR ARTERY STENTING AFTER REHAB PROGRAM completed per interventional radiology  Continue CIR   WHO/PRAFO qhs 2.  Antithrombotics: -DVT/anticoagulation: Lovenox   CBC within normal limits on 1/28             -antiplatelet therapy: Aspirin 325 mg daily, Plavix 75 mg daily x3 months then Plavix alone 3. Pain Management: Tylenol as needed 4. Mood: Provide emotional support             -antipsychotic agents: N/A 5. Neuropsych: This patient is capable of making decisions on her own behalf. 6. Skin/Wound Care: Routine skin checks 7. Fluids/Electrolytes/Nutrition: Routine in and outs 8.  Essential hypertension: Patient on Norvasc 10 mg daily and cadestran-HCTZ 32-25 mg daily prior to admission.  Resume as needed             Labile and?  Trending up on 2/2, monitor for trend  Monitor with increased mobility 9.  Hypothyroidism.  Continue Synthroid 10.  Hyperlipidemia: Continue Lipitor 11.  Post stroke dysphagia            Diet advanced to regular thins 12.  Transaminitis: Resolved  LFTs within normal limits on 2/1 13.  Hypoalbuminemia  Supplement initiated on 1/28 14.  Prediabetes  Slightly elevated on  2/1  Monitor with increased mobility 15.?  Mild AKI versus CKD  GFR 59 on 2/1, labs ordered for tomorrow  Continue to monitor   LOS: 6 days A FACE TO FACE EVALUATION WAS PERFORMED  Ankit Lorie Phenix 06/26/2019, 8:15 AM

## 2019-06-26 NOTE — Progress Notes (Signed)
Speech Language Pathology Daily Session Note  Patient Details  Name: KIMBERLLY HUBANKS MRN: [id number] Date of Birth: [birth year]  Today's Date: 06/26/2019 SLP Individual Time: 0730-0820 SLP Individual Time Calculation (min): 50 min  Short Term Goals: Week 1: SLP Short Term Goal 1 (Week 1): Patient will consume trials of regular textures with efficient mastication and complete oral clearance without overt s/s of aspiration with supervision level verbal cues over 2 sessions prior to upgrade. SLP Short Term Goal 2 (Week 1): Patient will consume current diet with minimal overt s/s of aspiration with Mod I for use of swallowing compensatory strategies. SLP Short Term Goal 3 (Week 1): Patient will utilize speech intelligibility strategies at the phrase level with Min A verbal cues to achieve ~75% intelligibility.  Skilled Therapeutic Interventions:  Skilled treatment session targeted dysphagia and cognition goals. SLP facilitated session by providing Min A verbal cues to slow rate of speech in order to achieve ~ 90% intelligible speech at the sentence level during barrier description task. Pt with good self-monitoring and self-correcting skills as she readily self-corrected imprecise articulation when producing multi-syllabic words. Additionally, pt was Mod I with use of chin tuck when consuming thin liquids via straw during distracting task. Education provided on plan to repeat MBSS on 06/27/19 to assess pt's ability to protect airway without use of chin tuck. Pt left upright in wheelchair, lap belt alarm on and all needs within reach. Continue per current plan of care.      Pain    Therapy/Group: Individual Therapy  Taneeka Curtner 06/26/2019, 10:28 AM

## 2019-06-26 NOTE — Progress Notes (Signed)
Physical Therapy Session Note  Patient Details  Name: Megan Martinez MRN: [id number] Date of Birth: [birth year]  Today's Date: 06/26/2019 PT Individual Time: 1400-1500 PT Individual Time Calculation (min): 60 min   Short Term Goals: Week 1:  PT Short Term Goal 1 (Week 1): Patient will perform basic transfers with close supervision using LRAD. PT Short Term Goal 2 (Week 1): Patient will ambulate >100 feet using LRAD with CGA. PT Short Term Goal 3 (Week 1): Patient will perform dynamic standing balance activities with CGA without UE support.  Skilled Therapeutic Interventions/Progress Updates:    Discussed home environment set-up and available assistance from daughter. Pt reporting home is accessible via w/c or with RW. Pt eager to go home and discussed goals for increasing independent and safety with mobility. NMR: Focused on stair negotiation as pt with 1 step to enter without rails for support. Min assist to ascend with L rail but up to mod/max assist to descend due to need for assist for placement of RLE during descent and postural control and significant R knee hyperextension. (No acewrap donned). Trial with AFO with tibial component and improved ability to control RLE during descent (mod assist) x 4 steps with L rail for support. Gait trial with AFO and focus on R knee control in stance (increased hyperextension noted) but improved foot clearance. With ACE wrap, demonstrates decreased foot clearance but still with knee hyperextension (questionable if less or more than with AFO). Discussed benefits and purpose of a brace but will continue to work on addressing least restrictive need prior to d/c. Pt verbalized understanding and in agreement. Performed various gait trials around obstalces with overall min assist and intermittent cues for attention to RLE placement during turns and RW positioning. In standing, performed toe taps with LLE to promote RLE strengthening and focus on knee control with PT  preventing hyperextension. Then progressed to pre-gait for single step forward and backward with focus on knee control. Likely with benefit from an orthotist consult. Pt propels w/c mod I to and from therapy with hemi technique,. On the way down to therapy gym, even able to hold RW on lap while performing w/c mobility.    Therapy Documentation Precautions:  Precautions Precautions: Fall Precaution Comments: right hemiparesis Restrictions Weight Bearing Restrictions: No Other Position/Activity Restrictions: RLE ace wrapped into dorsiflexion. Pain: Pain Assessment Pain Score: 0-No pain    Therapy/Group: Individual Therapy  Canary Brim Ivory Broad, PT, DPT, CBIS  06/26/2019, 3:18 PM

## 2019-06-27 ENCOUNTER — Inpatient Hospital Stay (HOSPITAL_COMMUNITY): Payer: PPO | Admitting: Occupational Therapy

## 2019-06-27 ENCOUNTER — Inpatient Hospital Stay (HOSPITAL_COMMUNITY): Payer: PPO

## 2019-06-27 ENCOUNTER — Encounter (HOSPITAL_COMMUNITY): Payer: PPO | Admitting: Speech Pathology

## 2019-06-27 ENCOUNTER — Inpatient Hospital Stay (HOSPITAL_COMMUNITY): Payer: PPO | Admitting: Physical Therapy

## 2019-06-27 DIAGNOSIS — I10 Essential (primary) hypertension: Secondary | ICD-10-CM

## 2019-06-27 LAB — BASIC METABOLIC PANEL
Anion gap: 9 (ref 5–15)
BUN: 15 mg/dL (ref 8–23)
CO2: 27 mmol/L (ref 22–32)
Calcium: 9.2 mg/dL (ref 8.9–10.3)
Chloride: 107 mmol/L (ref 98–111)
Creatinine, Ser: 0.92 mg/dL (ref 0.44–1.00)
GFR calc Af Amer: >60 mL/min (ref >60)
GFR calc non Af Amer: >60 mL/min (ref >60)
Glucose, Bld: 128 mg/dL — ABNORMAL HIGH (ref 70–99)
Potassium: 3.7 mmol/L (ref 3.5–5.1)
Sodium: 143 mmol/L (ref 135–145)

## 2019-06-27 LAB — PLATELET INHIBITION P2Y12: Platelet Function  P2Y12: 74 [PRU] — ABNORMAL LOW (ref 182–335)

## 2019-06-27 NOTE — Progress Notes (Signed)
NIR.  Patient with known mid basilar artery stenosis and it is recommended that patient undergo revascularization to prevent from future strokes. Patient currently admitted to CIR. Received call from Irving Burton from [facility] stating that patient is scheduled for discharge from [facility] 07/05/2019.  Discussed with Dr. Estanislado Pandy. Due to scheduling, plan for image-guided cerebral arteriogram with possible revascularization/angioplasty/stent placement of mid basilar artery stenosis tentatively for Monday 07/09/2019 with Dr. Estanislado Pandy on an outpatient basis. Went to see patient in room to inform of above. Informed patient that our schedulers will call her to set up this procedure. All questions answered and concerns addressed. Informed Dan, PA-C of plans- at this time, patient to be discharged home from [facility] tentatively on 07/05/2019, and to return to [facility] 07/09/2019 on an outpatient basis for procedure. Will obtain P2Y12 today to check effectiveness of DAPT- continue taking Plavix 75 mg once daily and Aspirin 325 mg once daily.  Further plans per CIR- appreciate and agree with management. Please call NIR with questions/concerns.   Bea Graff Louk, PA-C 06/27/2019, 3:40 PM

## 2019-06-27 NOTE — Patient Care Conference (Signed)
Inpatient RehabilitationTeam Conference and Plan of Care Update Date: 06/27/2019   Time: 11:10 AM    Patient Name: Megan Martinez      Medical Record Number: [id number]  Date of Birth: 1950-06-28 Sex: Female         Room/Bed: 4M09C/4M09C-01 Payor Info: Payor: [employer]imer[employer]AGE / Plan: [employer] Must PPO / Product Type: *No Product type* /    Admit Date/Time:  06/20/2019  3:55 PM  Primary Diagnosis:  Left pontine cerebrovascular accident [facility])  Patient Active Problem List   Diagnosis Date Noted  . Benign essential HTN   . Stage 2 chronic kidney disease   . Prediabetes   . Transaminitis   . Hypoalbuminemia due to protein-calorie malnutrition ([name])   . Labile blood pressure   . Left pontine cerebrovascular accident ([name]) 06/20/2019  . Acute ischemic stroke ([name])   . Right hemiparesis ([name])   . Dysphagia, post-stroke   . Basilar artery stenosis with infarction [name]) s/p tPA 06/19/2019  . Stroke (cerebrum) ([name]) L pontine d/t BA stenosis 06/14/2019  . Subcutaneous nodule of left foot 01/03/2019  . Atopic dermatitis 10/18/2018  . Seasonal allergies 08/09/2018  . 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 Osteoarthritis of carpometacarpal (CMC) joint of thumb 11/07/2017  . Acquired hypothyroidism 08/04/2015  . Essential hypertension 08/04/2015  . GERD without esophagitis 08/04/2015  . Mixed hyperlipidemia 08/04/2015  . Osteopenia 08/04/2015  . Primary insomnia 08/04/2015  . Pure hypertriglyceridemia 08/04/2015    Expected Discharge Date: Expected Discharge Date: 07/05/19  Team Members Present: Physician leading conference: Dr. Delice Lesch Social Worker Present: [name], LCSW Nurse Present: [name], RN;[name], RN Case Manager: [name], RN PT Present: [name], PT OT Present: [name], OT SLP Present: [name], CF-SLP PPS Coordinator present : [name], [name], SLP     Current  Status/Progress Goal Weekly Team Focus  Bowel/Bladder   continent b/b  remain continent of b/b  assist to commode/peri-care   Swallow/Nutrition/ Hydration   reg/thin, does not need to use chin tuck anymore (MBSS repeated to verify 2/3)  Mod I  Tolerance thin without chin tuck and will likely meet goals for swallow with 1 additional session   ADL's   min A overall  mod I toileting, toilet transfers, Supervision with bathing, dressing, shower stall transfers  ADL training, functional mobility, RUE/RLE NMR, balance, postural control, pt education   Mobility   min assist overall transfers and gait with RW; mod assist stairs  supervision transfers and houeshold gait; CGA for stairs  gait training, RUE/LE NMR, balance, postural control re-training, stair negotiation   Communication   Min A  Supervision A  carryover intelligibility strategies   Safety/Cognition/ Behavioral Observations            Pain   no c/o pain  continue to be pain free  provide care/support to maintain pain free state   Skin   warm, dry, intact; ecchymosis  keep skin free from exceptions  continue  assessments    Rehab Goals Patient on target to meet rehab goals: Yes *See Care Plan and progress notes for long and short-term goals.     Barriers to Discharge  Current Status/Progress Possible Resolutions Date Resolved   Nursing                  PT                    OT  SLP                SW                Discharge Planning/Teaching Needs:  Home with boyfriend and daughter to provide 24/7 supervision  Teaching needs TBD   Team Discussion: HTN, monitoring BP, creatinine elevated, monitoring labs, will need stenting prior to DC by IR.  RN cont B/B, back pain.  OT min/CGA overall, estim daily.  PT min a overall, mod a stairs, S/mod I goals.  SLP MBS done today, regular thins, speech intelligible, S goals.  Will need intermittent S at DC.   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 Weekly Focus/Goal: Therapies, optimize BP meds, improve ?AKI  Barriers to Discharge: Medical stability;Other (comments)  Barriers to Discharge Comments: Basilar artery stenosis Possible Resolutions to Barriers: Therapies, follow labs, optimize BP meds, BASILAR ARTERY STENTING AFTER REHAB   Continued Need for Acute Rehabilitation Level of Care: The patient requires daily medical management by a physician with specialized training in physical medicine and rehabilitation for the following reasons: Direction of a multidisciplinary physical rehabilitation program to maximize functional independence : Yes Medical management of patient stability for increased activity during participation in an intensive rehabilitation regime.: Yes Analysis of laboratory values and/or radiology reports with any subsequent need for medication adjustment and/or medical intervention. : Yes   I attest that I was present, lead the team conference, and concur with the assessment and plan of the team.   Retta Diones 06/27/2019, 9:30 PM   Team conference was held via web/ teleconference due to [place] - 19

## 2019-06-27 NOTE — Progress Notes (Signed)
Occupational Therapy Session Note  Patient Details  Name: Megan Martinez MRN: [id number] Date of Birth: 1950/07/23  Today's Date: 06/27/2019  OT Individual Time: 1140-1220 OT Individual Time Calculation (min): 40 min (40 min of unattended estim 1220-1256)  Short Term Goals: Week 1:  OT Short Term Goal 1 (Week 1): Pt will peform toileting with min A with extra time OT Short Term Goal 2 (Week 1): Pt will perform LB dressing with mod A including footware OT Short Term Goal 3 (Week 1): Pt will perform one grooming task in standing with min A OT Short Term Goal 4 (Week 1): Pt will perform bed mobility in prep for ADL with min A wtih cues  Skilled Therapeutic Interventions/Progress Updates:    Pt seen this session to facilitate RUE motor control. Pt taken to gym to work on RUE NMR:  Supine: Placing and holding of R arm at 90 sh flexion, pt able to hold for a second or two Punching arm up towards ceiling focusing on serratus anterior strength tricep a/arom extension overhead Pt displayed more active movement today. B hand on dowel bar (R hand supported on bar with coban) pushing up to ceiling and down to hips Sitting: B hands on bar for rowing and kayaking exercises with guided A with R shoulder R arm supported on table with towel slides for elbow flexion - pt able to flex elbow 30-40 degrees gravity eliminated. \ estim attended for 10 min and then unattended for 40 min to stimulate wrist and finger extensors.   Saebo Stim One 330 pulse width 35 Hz pulse rate On 8 sec/ off 8 sec Ramp up/ down 2 sec Symmetrical Biphasic wave form  Max intensity 189m at 500 Ohm load  Pt tolerated well.   Pt resting in wc with belt alarm on and all needs met.   Therapy Documentation Precautions:  Precautions Precautions: Fall Precaution Comments: right hemiparesis Restrictions Weight Bearing Restrictions: No Other Position/Activity Restrictions: RLE ace wrapped into dorsiflexion.       Pain: Pain Assessment Pain Scale: 0-10 Pain Score: 3  Pain Type: Acute pain Pain Location: Back Pain Orientation: Right Pain Descriptors / Indicators: Sore Pain Frequency: Rarely Pain Intervention(s): Medication (See eMAR) ADL: ADL Eating: Set up Grooming: Contact guard Where Assessed-Grooming: Standing at sink Upper Body Bathing: Minimal cueing Where Assessed-Upper Body Bathing: Shower Lower Body Bathing: Contact guard Where Assessed-Lower Body Bathing: Shower Upper Body Dressing: Minimal assistance Where Assessed-Upper Body Dressing: Wheelchair Lower Body Dressing: Moderate assistance Where Assessed-Lower Body Dressing: Wheelchair Toileting: Moderate assistance Toilet Transfer: Moderate assistance Tub/Shower Transfer Method: Stand pivot Tub/Shower Equipment: Walk in sRetail buyer MEnvironmental education officerMethod: Stand pivot  Therapy/Group: Individual Therapy  SB[place]annon2/07/2019, 12:56 PM

## 2019-06-27 NOTE — Progress Notes (Signed)
Physical Therapy Session Note  Patient Details  Name: Megan Martinez MRN: [id number] Date of Birth: 1950-11-26  Today's Date: 06/27/2019 PT Individual Time: 1305-1403 PT Individual Time Calculation (min): 58 min   Short Term Goals: Week 1:  PT Short Term Goal 1 (Week 1): Patient will perform basic transfers with close supervision using LRAD. PT Short Term Goal 2 (Week 1): Patient will ambulate >100 feet using LRAD with CGA. PT Short Term Goal 3 (Week 1): Patient will perform dynamic standing balance activities with CGA without UE support.  Skilled Therapeutic Interventions/Progress Updates: Pt presented in w/c agreeable to therapy. Session focused on gait training with use of Lite Gait the treadmill. PTA transported pt to day room and donned GRAFO for time management. Pt set up in Lite Gait and ambulated x 3 bouts 3 min ea at .5 mph with seated rests between bouts. Pt with R hand ace bandaged to Lite Gait for support. Pt noted to be able clear R foot (but land foot flat) however demonstrated significant hyperextension on step through. PTA blocked R knee to minimize hyperextension. PTA noted at time pt was able to step through with decreased force in hyperextension. Pt transported back to room at end of session and left with w/c with belt alarm placed, call bell within reach and needs met.   Trial 1: 52mn  .5 mph 149 ft Trial 2: 3 min 5 mph 126 ft Trial 3: 2:434m .5 mph 11078f   Therapy Documentation Precautions:  Precautions Precautions: Fall Precaution Comments: right hemiparesis Restrictions Weight Bearing Restrictions: No Other Position/Activity Restrictions: RLE ace wrapped into dorsiflexion. General:   Vital Signs: Therapy Vitals Temp: 98.2 F (36.8 C) Temp Source: Oral Pulse Rate: (!) 55 Resp: 16 BP: 126/63 Patient Position (if appropriate): Sitting Oxygen Therapy SpO2: 94 % O2 Device: Room Air   Therapy/Group: Individual Therapy  Deric Bocock  Orvile Corona, PTA  06/27/2019, 4:19 PM

## 2019-06-27 NOTE — Progress Notes (Signed)
Modified Barium Swallow Progress Note  Patient Details  Name: Megan Martinez MRN: [id number] Date of Birth: 05/08/1951  Today's Date: 06/27/2019  Modified Barium Swallow completed.  Full report located under Chart Review in the Imaging Section.  Brief recommendations include the following:  Clinical Impression Pt presents with improved ability to protect her airway with thin liquids without need for use of any compensatory strategies since last MBSS 06/14/19. Pt has a hx of silent aspiration, and previous instrumental study recommended chin tuck maneuver to prevent airway intrusion with thins. However, today no penetration or aspiration was observed throughout several trials of thin barium via cup and straw, or when consuming thin with barium pill. Pt's oral phase was only remarkable for reduced posterior propulsion of large chalky barium pill, and reduced cohesion of dual consistency thin with pill bolus. Given improvements in oropharyngeal swallow function, recommend pt continue current regular/thin diet and there is no longer a need for pt to perform chin tuck with liquids. Pt may consume small pills whole with thins, however large pills should either be broken to take with thin and/or consumed whole in applesauce (based on pt preference).       Swallow Evaluation Recommendations       SLP Diet Recommendations: Regular solids;Thin liquid   Liquid Administration via: Cup;Straw   Medication Administration: Whole meds with liquid   Supervision: Patient able to self feed;Intermittent supervision to cue for compensatory strategies   Compensations: Minimize environmental distractions;Slow rate;Small sips/bites   Postural Changes: Remain semi-upright after after feeds/meals (Comment)   Oral Care Recommendations: Oral care BID        Arbutus Leas 06/27/2019,9:29 AM

## 2019-06-27 NOTE — Progress Notes (Signed)
Occupational Therapy Session Note  Patient Details  Name: Megan Martinez MRN: [id number] Date of Birth: 1951-02-20  Today's Date: 06/27/2019 OT Individual Time: [id number] OT Individual Time Calculation (min): 71 min   Short Term Goals: Week 1:  OT Short Term Goal 1 (Week 1): Pt will peform toileting with min A with extra time OT Short Term Goal 2 (Week 1): Pt will perform LB dressing with mod A including footware OT Short Term Goal 3 (Week 1): Pt will perform one grooming task in standing with min A OT Short Term Goal 4 (Week 1): Pt will perform bed mobility in prep for ADL with min A wtih cues  Skilled Therapeutic Interventions/Progress Updates:    Upon entering the room, pt supine in bed with no c/o pain this session and agreeable to OT intervention. Pt verbalized need for BM this session.Pt performed bed mobility with close supervision and min cuing to attend to R UE for safety. Pt standing with min guard and ambulating to bathroom with RW and min A. Pt having BM and performing hygiene while seated on commode. Pt also removing clothing items while seated on toilet for safety. Pt ambulating with min A and RW to shower and onto TTB for bathing tasks. Pt utilized LH sponge to increase I with bathing tasks. Pt needing hand over hand assistance to incorporate R UE into functional task. Pt seated on EOB for dressing with figure four position to thread clothing onto B feet. Min A for standing balance when pulling clothing over B hips. Pt able to utilized one handed technique for B socks and shoes but needing assistance to tie laces. Pt transferred back to wheelchair with min guard and RW. Breakfast tray placed in front of her and she was able to open containers one handed. Call bell within reach and chair alarm belt activated.   Therapy Documentation Precautions:  Precautions Precautions: Fall Precaution Comments: right hemiparesis Restrictions Weight Bearing Restrictions: No Other  Position/Activity Restrictions: RLE ace wrapped into dorsiflexion. Pain: Pain Assessment Pain Scale: 0-10 Pain Score: 3  Pain Type: Acute pain Pain Location: Back Pain Orientation: Right Pain Descriptors / Indicators: Sore Pain Frequency: Rarely Pain Intervention(s): Medication (See eMAR) ADL: ADL Eating: Set up Grooming: Contact guard Where Assessed-Grooming: Standing at sink Upper Body Bathing: Minimal cueing Where Assessed-Upper Body Bathing: Shower Lower Body Bathing: Contact guard Where Assessed-Lower Body Bathing: Shower Upper Body Dressing: Minimal assistance Where Assessed-Upper Body Dressing: Wheelchair Lower Body Dressing: Moderate assistance Where Assessed-Lower Body Dressing: Wheelchair Toileting: Moderate assistance Toilet Transfer: Moderate assistance Tub/Shower Transfer Method: Stand pivot Tub/Shower Equipment: Walk in Retail buyer: Environmental education officer Method: Stand pivot   Therapy/Group: Individual Therapy  Gypsy Decant 06/27/2019, 12:49 PM

## 2019-06-27 NOTE — Progress Notes (Addendum)
[facility] PHYSICAL MEDICINE & REHABILITATION PROGRESS NOTE  Subjective/Complaints: Patient seen sitting up in her chair this AM, working with therapies.  She states she slept well overnight.  She notes some improvement in movement in RUE.  ROS: Denies CP, shortness of breath, nausea, vomiting, diarrhea.  Objective: Vital Signs: Blood pressure 114/61, pulse (!) 53, temperature 98.7 F (37.1 C), temperature source Oral, resp. rate 18, height 5\' 2"  (1.575 m), weight 72.8 kg, SpO2 96 %. No results found. No results for input(s): WBC, HGB, HCT, PLT in the last 72 hours. Recent Labs    06/25/19 0556  NA 144  K 3.7  CL 106  CO2 26  GLUCOSE 111*  BUN 14  CREATININE 0.98  CALCIUM 9.4    Physical Exam: BP 114/61   Pulse (!) 53   Temp 98.7 F (37.1 C) (Oral)   Resp 18   Ht 5\' 2"  (1.575 m)   Wt 72.8 kg   SpO2 96%   BMI 29.34 kg/m  Constitutional: No distress . Vital signs reviewed. HENT: Normocephalic.  Atraumatic. Eyes: EOMI. No discharge. Cardiovascular: No JVD. Respiratory: Normal effort.  No stridor. GI: Non-distended. Skin: Warm and dry.  Intact. Psych: Normal mood.  Normal behavior. Musc: No edema in extremities.  No tenderness in extremities. Neurological: Alert Dysarthria, stable She makes eye contact with examiner and follows simple commands.   Appears to have fair insight and awareness. Motor: RUE: Shoulder abduction, elbow flexion/extension 1/5, distally 0/5 RLE: Hip flexion, knee extension 4/5, ankle dorsiflexion 0/5, stable.   No increase in tone  Assessment/Plan: 1. Functional deficits secondary to left pontine infarct which require 3+ hours per day of interdisciplinary therapy in a comprehensive inpatient rehab setting.  Physiatrist is providing close team supervision and 24 hour management of active medical problems listed below.  Physiatrist and rehab team continue to assess barriers to discharge/monitor patient progress toward functional and medical  goals  Care Tool:  Bathing    Body parts bathed by patient: Right arm, Chest, Abdomen, Front perineal area, Buttocks, Right upper leg, Left upper leg, Right lower leg, Left lower leg, Face, Left arm   Body parts bathed by helper: Left arm     Bathing assist Assist Level: Contact Guard/Touching assist     Upper Body Dressing/Undressing Upper body dressing   What is the patient wearing?: Pull over shirt, Bra    Upper body assist Assist Level: Minimal Assistance - Patient > 75%    Lower Body Dressing/Undressing Lower body dressing    Lower body dressing activity did not occur: Safety/medical concerns What is the patient wearing?: Pants     Lower body assist Assist for lower body dressing: Minimal Assistance - Patient > 75%     Toileting Toileting    Toileting assist Assist for toileting: Minimal Assistance - Patient > 75%     Transfers Chair/bed transfer  Transfers assist  Chair/bed transfer activity did not occur: Safety/medical concerns  Chair/bed transfer assist level: Contact Guard/Touching assist     Locomotion Ambulation   Ambulation assist      Assist level: Minimal Assistance - Patient > 75% Assistive device: Walker-rolling Max distance: 50   Walk 10 feet activity   Assist     Assist level: Minimal Assistance - Patient > 75% Assistive device: Walker-rolling, Orthosis   Walk 50 feet activity   Assist Walk 50 feet with 2 turns activity did not occur: Safety/medical concerns(R hemi, decreased strength/balance)  Assist level: Minimal Assistance - Patient > 75%  Assistive device: Walker-rolling, Orthosis    Walk 150 feet activity   Assist Walk 150 feet activity did not occur: Safety/medical concerns(R hemi, decreased strength/balance)  Assist level: Total Assistance - Patient < 25% Assistive device: Lite Gait    Walk 10 feet on uneven surface  activity   Assist Walk 10 feet on uneven surfaces activity did not occur: Safety/medical  concerns(R hemi, decreased strength/balance)         Wheelchair     Assist Will patient use wheelchair at discharge?: No(no LT goals set for discharge) Type of Wheelchair: Manual    Wheelchair assist level: Independent Max wheelchair distance: 150'    Wheelchair 50 feet with 2 turns activity    Assist    Wheelchair 50 feet with 2 turns activity did not occur: Safety/medical concerns(R hemi, required skilled interventions for safe technique)   Assist Level: Independent   Wheelchair 150 feet activity     Assist Wheelchair 150 feet activity did not occur: Safety/medical concerns(R hemi, required skilled interventions for safe technique)   Assist Level: Independent      Medical Problem List and Plan: 1.  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  PLAN BASILAR ARTERY STENTING AFTER REHAB PROGRAM completed per interventional radiology.  Continue CIR   WHO/PRAFO qhs  Team conference today to discuss current and goals and coordination of care, home and environmental barriers, and discharge planning with nursing, case manager, and therapies.  2.  Antithrombotics: -DVT/anticoagulation: Lovenox   CBC within normal limits on 1/28, labs ordered for tomorrow             -antiplatelet therapy: Aspirin 325 mg daily, Plavix 75 mg daily x3 months then Plavix alone 3. Pain Management: Tylenol as needed 4. Mood: Provide emotional support             -antipsychotic agents: N/A 5. Neuropsych: This patient is capable of making decisions on her own behalf. 6. Skin/Wound Care: Routine skin checks 7. Fluids/Electrolytes/Nutrition: Routine in and outs 8.  Essential hypertension: Patient on Norvasc 10 mg daily and cadestran-HCTZ 32-25 mg daily prior to admission.  Resume as needed             Labile on 2/3  Monitor with increased mobility 9.  Hypothyroidism.  Continue Synthroid 10.   Hyperlipidemia: Continue Lipitor 11.  Post stroke dysphagia            Diet advanced to regular thins 12.  Transaminitis: Resolved  LFTs within normal limits on 2/1 13.  Hypoalbuminemia  Supplement initiated on 1/28 14.  Prediabetes  Slightly elevated on 2/1, labs pending  Monitor with increased mobility 15. ? Mild AKI versus CKD  GFR 59 on 2/1, labs pending  Continue to monitor   LOS: 7 days A FACE TO FACE EVALUATION WAS PERFORMED  Ido Wollman Lorie Phenix 06/27/2019, 8:34 AM

## 2019-06-28 ENCOUNTER — Inpatient Hospital Stay (HOSPITAL_COMMUNITY): Payer: PPO | Admitting: Physical Therapy

## 2019-06-28 ENCOUNTER — Inpatient Hospital Stay (HOSPITAL_COMMUNITY): Payer: PPO | Admitting: *Deleted

## 2019-06-28 ENCOUNTER — Inpatient Hospital Stay (HOSPITAL_COMMUNITY): Payer: PPO

## 2019-06-28 ENCOUNTER — Inpatient Hospital Stay (HOSPITAL_COMMUNITY): Payer: PPO | Admitting: Occupational Therapy

## 2019-06-28 DIAGNOSIS — E876 Hypokalemia: Secondary | ICD-10-CM

## 2019-06-28 DIAGNOSIS — I771 Stricture of artery: Secondary | ICD-10-CM

## 2019-06-28 LAB — CBC WITH DIFFERENTIAL/PLATELET
Abs Immature Granulocytes: 0.01 10*3/uL (ref 0.00–0.07)
Basophils Absolute: 0 10*3/uL (ref 0.0–0.1)
Basophils Relative: 1 %
Eosinophils Absolute: 0.2 10*3/uL (ref 0.0–0.5)
Eosinophils Relative: 3 %
HCT: 39.2 % (ref 36.0–46.0)
Hemoglobin: 12.9 g/dL (ref 12.0–15.0)
Immature Granulocytes: 0 %
Lymphocytes Relative: 25 %
Lymphs Abs: 1.6 10*3/uL (ref 0.7–4.0)
MCH: 29.5 pg (ref 26.0–34.0)
MCHC: 32.9 g/dL (ref 30.0–36.0)
MCV: 89.7 fL (ref 80.0–100.0)
Monocytes Absolute: 0.6 10*3/uL (ref 0.1–1.0)
Monocytes Relative: 9 %
Neutro Abs: 4 10*3/uL (ref 1.7–7.7)
Neutrophils Relative %: 62 %
Platelets: 236 10*3/uL (ref 150–400)
RBC: 4.37 MIL/uL (ref 3.87–5.11)
RDW: 13.3 % (ref 11.5–15.5)
WBC: 6.3 10*3/uL (ref 4.0–10.5)
nRBC: 0 % (ref 0.0–0.2)

## 2019-06-28 NOTE — Progress Notes (Signed)
Occupational Therapy Session Note  Patient Details  Name: Megan Martinez MRN: [id number] Date of Birth: 1950/09/24  Today's Date: 06/28/2019 OT Individual Time: 1425-1525 OT Individual Time Calculation (min): 60 min    Short Term Goals: Week 1:  OT Short Term Goal 1 (Week 1): Pt will peform toileting with min A with extra time OT Short Term Goal 2 (Week 1): Pt will perform LB dressing with mod A including footware OT Short Term Goal 3 (Week 1): Pt will perform one grooming task in standing with min A OT Short Term Goal 4 (Week 1): Pt will perform bed mobility in prep for ADL with min A wtih cues  Skilled Therapeutic Interventions/Progress Updates:    1;1. Pt received in w/c agreeable to bathing and dressing. Pt completes w/c mobility throughout sessoin with S and transfers to toilet with MIN A and VC for direction turning/hand placement. Pt completes toielting with MIN A overall for standing CM/seated hygiene. Pt completes sidelying and supine therex for   LLE: clam shells-ab/adduciton, hip flex/ext, and glute bridges with manual resistance and tactile cues to decrease compensatory movement LUE: shoulder flex/ext seated EOM/supine, elbow/flex/ext-trace activation, and pt able to maintain LUE straight at 90* shoulder flexion in supine and resist OT pressure to keep arm fully engaged. Pt requres tactile cues at scapula to engage/facilitate movement.   Exited session with pt seated in w/c, exit alarm on and call light in reach  Therapy Documentation Precautions:  Precautions Precautions: Fall Precaution Comments: right hemiparesis Restrictions Weight Bearing Restrictions: No Other Position/Activity Restrictions: RLE ace wrapped into dorsiflexion. General:   Vital Signs: Therapy Vitals Temp: 97.9 F (36.6 C) Temp Source: Oral Pulse Rate: (!) 59 Resp: 16 BP: (!) 149/61 Patient Position (if appropriate): Sitting Oxygen Therapy SpO2: 97 % O2 Device: Room Air Pain: Pain  Assessment Pain Score: 0-No pain ADL: ADL Eating: Set up Grooming: Contact guard Where Assessed-Grooming: Standing at sink Upper Body Bathing: Minimal cueing Where Assessed-Upper Body Bathing: Shower Lower Body Bathing: Contact guard Where Assessed-Lower Body Bathing: Shower Upper Body Dressing: Minimal assistance Where Assessed-Upper Body Dressing: Wheelchair Lower Body Dressing: Moderate assistance Where Assessed-Lower Body Dressing: Wheelchair Toileting: Moderate assistance Toilet Transfer: Moderate assistance Tub/Shower Transfer Method: Stand pivot Tub/Shower Equipment: Walk in Retail buyer: Minimal assistance Social research officer, government Method: Surveyor, mining    Praxis   Exercises:   Other Treatments:     Therapy/Group: Individual Therapy  Tonny Branch 06/28/2019, 3:38 PM

## 2019-06-28 NOTE — Progress Notes (Signed)
Orthopedic Tech Progress Note Patient Details:  Megan Martinez [birth year] [id number] Called in order to [facility]  For a AFO Patient ID: Megan Martinez, female   DOB: 05-25-1950, 69 y.o.   MRN: [id number]   Janit Pagan 06/28/2019, 10:00 AM

## 2019-06-28 NOTE — Progress Notes (Signed)
Social Work Patient ID: Megan Martinez, female   DOB: 23-Oct-1950, 68 y.o.   MRN: [id number]   Met yesterday with pt to review team conference. She is aware and agreeable with targeted d/c date of 2/11 and supervision/ mod ind goals overall.  She was hopeful for earlier d/c, however, agrees with plan to the 11th.  Continue to follow.  Adrianne Shackleton, LCSW

## 2019-06-28 NOTE — Progress Notes (Signed)
[place] PHYSICAL MEDICINE & REHABILITATION PROGRESS NOTE  Subjective/Complaints: Patient seen sitting up at the edge of her bed this morning.  Good sitting balance noted.  She states she slept well overnight.  She states that she was seen by [facility] yesterday and informed of plans for stent on 2/15.  ROS: Denies CP, shortness of breath, nausea, vomiting, diarrhea.  Objective: Vital Signs: Blood pressure (!) 119/56, pulse (!) 53, temperature 97.9 F (36.6 C), resp. rate 16, height 5\' 2"  (1.575 m), weight 72.8 kg, SpO2 96 %. DG Swallowing Func-Speech Pathology  Result Date: 06/27/2019 Objective Swallowing Evaluation: Type of Study: MBS-Modified Barium Swallow Study  Patient Details Name: Megan Martinez MRN: [id number] Date of Birth: August 17, 1950 Today's Date: 06/27/2019 Time: SLP Start Time : 0902 -SLP Stop Time : 0915 SLP Time Calculation (min): 13 min Past Medical History: Past Medical History: Diagnosis Date . Allergy  . Arthritis  . Hypertension  . Thyroid disease  Past Surgical History: Past Surgical History: Procedure Laterality Date . ABDOMINAL HYSTERECTOMY   . APPENDECTOMY   . CATARACT EXTRACTION   . CHOLECYSTECTOMY   . TUBAL LIGATION   HPI: 69 y.o. female with a history of hypertension admitted with right sided hemiparesis and facial asymmetry; received IV TPA.  Dx acute left paramedian pontine CVA. Pt [facility] to [facility] 06/20/19.  Subjective: alert, cooporative Assessment / Plan / Recommendation CHL IP CLINICAL IMPRESSIONS 06/27/2019 Clinical Impression Pt presents with improved ability to protect her airway with thin liquids without need for use of any compensatory strategies since last MBSS 06/14/19. Pt has a hx of silent aspiration, and previous instrumental study recommended chin tuck maneuver to prevent airway intrusion with thins. However, today no penetration or aspiration was observed throughout several trials of thin barium via cup and straw, or when consuming thin with barium pill. Pt's oral phase  was only remarkable for reduced posterior propulsion of large chalky barium pill, and reduced cohesion of dual consistency thin with pill bolus. Given improvements in oropharyngeal swallow function, recommend pt continue current regular/thin diet and there is no longer a need for pt to perform chin tuck with liquids. Pt may consume small pills whole with thins, however large pills should either be broken to take with thin and/or consumed whole in applesauce (based on pt preference). SLP Visit Diagnosis Dysphagia, unspecified (R13.10) Attention and concentration deficit following -- Frontal lobe and executive function deficit following -- Impact on safety and function Mild aspiration risk   CHL IP TREATMENT RECOMMENDATION 06/15/2019 Treatment Recommendations Therapy as outlined in treatment plan below   Prognosis 06/27/2019 Prognosis for Safe Diet Advancement Good Barriers to Reach Goals -- Barriers/Prognosis Comment -- CHL IP DIET RECOMMENDATION 06/27/2019 SLP Diet Recommendations Regular solids;Thin liquid Liquid Administration via Cup;Straw Medication Administration Whole meds with liquid Compensations Minimize environmental distractions;Slow rate;Small sips/bites Postural Changes Remain semi-upright after after feeds/meals (Comment)   CHL IP OTHER RECOMMENDATIONS 06/27/2019 Recommended Consults -- Oral Care Recommendations Oral care BID Other Recommendations --   CHL IP FOLLOW UP RECOMMENDATIONS 06/18/2019 Follow up Recommendations Inpatient Rehab   CHL IP FREQUENCY AND DURATION 06/15/2019 Speech Therapy Frequency (ACUTE ONLY) min 2x/week Treatment Duration 2 weeks      CHL IP ORAL PHASE 06/27/2019 Oral Phase Impaired Oral - Pudding Teaspoon -- Oral - Pudding Cup -- Oral - Honey Teaspoon -- Oral - Honey Cup -- Oral - Nectar Teaspoon -- Oral - Nectar Cup -- Oral - Nectar Straw -- Oral - Thin Teaspoon -- Oral - Thin Cup  WFL Oral - Thin Straw WFL Oral - Puree NT Oral - Mech Soft NT Oral - Regular -- Oral - Multi-Consistency --  Oral - Pill Lingual pumping;Reduced posterior propulsion Oral Phase - Comment --  CHL IP PHARYNGEAL PHASE 06/27/2019 Pharyngeal Phase WFL Pharyngeal- Pudding Teaspoon -- Pharyngeal -- Pharyngeal- Pudding Cup -- Pharyngeal -- Pharyngeal- Honey Teaspoon -- Pharyngeal -- Pharyngeal- Honey Cup -- Pharyngeal -- Pharyngeal- Nectar Teaspoon -- Pharyngeal -- Pharyngeal- Nectar Cup -- Pharyngeal -- Pharyngeal- Nectar Straw -- Pharyngeal -- Pharyngeal- Thin Teaspoon -- Pharyngeal -- Pharyngeal- Thin Cup WFL Pharyngeal -- Pharyngeal- Thin Straw WFL Pharyngeal -- Pharyngeal- Puree NT Pharyngeal -- Pharyngeal- Mechanical Soft -- Pharyngeal -- Pharyngeal- Regular -- Pharyngeal -- Pharyngeal- Multi-consistency -- Pharyngeal -- Pharyngeal- Pill WFL Pharyngeal -- Pharyngeal Comment --  CHL IP CERVICAL ESOPHAGEAL PHASE 06/27/2019 Cervical Esophageal Phase WFL Pudding Teaspoon -- Pudding Cup -- Honey Teaspoon -- Honey Cup -- Nectar Teaspoon -- Nectar Cup -- Nectar Straw -- Thin Teaspoon -- Thin Cup -- Thin Straw -- Puree -- Mechanical Soft -- Regular -- Multi-consistency -- Pill -- Cervical Esophageal Comment -- Megan Martinez 06/27/2019, 9:38 AM              Recent Labs    06/28/19 0601  WBC 6.3  HGB 12.9  HCT 39.2  PLT 236   Recent Labs    06/27/19 1023  NA 143  K 3.7  CL 107  CO2 27  GLUCOSE 128*  BUN 15  CREATININE 0.92  CALCIUM 9.2    Physical Exam: BP (!) 119/56   Pulse (!) 53   Temp 97.9 F (36.6 C)   Resp 16   Ht 5\' 2"  (1.575 m)   Wt 72.8 kg   SpO2 96%   BMI 29.34 kg/m  Constitutional: No distress . Vital signs reviewed. HENT: Normocephalic.  Atraumatic. Eyes: EOMI. No discharge. Cardiovascular: No JVD. Respiratory: Normal effort.  No stridor. GI: Non-distended. Skin: Warm and dry.  Intact. Psych: Normal mood.  Normal behavior. Musc: No edema in extremities.  No tenderness in extremities. Neurological: Alert Dysarthria, unchanged She makes eye contact with examiner and follows simple  commands.   Appears to have fair insight and awareness. Motor: RUE: Shoulder abduction, elbow flexion/extension 1/5, distally 0/5, unchanged RLE: Hip flexion, knee extension 4/5, ankle dorsiflexion 0/5, stable, unchanged No increase in tone  Assessment/Plan: 1. Functional deficits secondary to left pontine infarct which require 3+ hours per day of interdisciplinary therapy in a comprehensive inpatient rehab setting.  Physiatrist is providing close team supervision and 24 hour management of active medical problems listed below.  Physiatrist and rehab team continue to assess barriers to discharge/monitor patient progress toward functional and medical goals  Care Tool:  Bathing    Body parts bathed by patient: Right arm, Chest, Abdomen, Front perineal area, Buttocks, Right upper leg, Left upper leg, Right lower leg, Left lower leg, Face, Left arm   Body parts bathed by helper: Left arm     Bathing assist Assist Level: Contact Guard/Touching assist     Upper Body Dressing/Undressing Upper body dressing   What is the patient wearing?: Pull over shirt, Bra    Upper body assist Assist Level: Minimal Assistance - Patient > 75%    Lower Body Dressing/Undressing Lower body dressing    Lower body dressing activity did not occur: Safety/medical concerns What is the patient wearing?: Pants     Lower body assist Assist for lower body dressing: Minimal Assistance - Patient > 75%  Toileting Toileting    Toileting assist Assist for toileting: Minimal Assistance - Patient > 75%     Transfers Chair/bed transfer  Transfers assist  Chair/bed transfer activity did not occur: Safety/medical concerns  Chair/bed transfer assist level: Contact Guard/Touching assist     Locomotion Ambulation   Ambulation assist      Assist level: Minimal Assistance - Patient > 75% Assistive device: Walker-rolling Max distance: 50   Walk 10 feet activity   Assist     Assist level:  Minimal Assistance - Patient > 75% Assistive device: Walker-rolling, Orthosis   Walk 50 feet activity   Assist Walk 50 feet with 2 turns activity did not occur: Safety/medical concerns(R hemi, decreased strength/balance)  Assist level: Minimal Assistance - Patient > 75% Assistive device: Walker-rolling, Orthosis    Walk 150 feet activity   Assist Walk 150 feet activity did not occur: Safety/medical concerns(R hemi, decreased strength/balance)  Assist level: Total Assistance - Patient < 25% Assistive device: Lite Gait    Walk 10 feet on uneven surface  activity   Assist Walk 10 feet on uneven surfaces activity did not occur: Safety/medical concerns(R hemi, decreased strength/balance)         Wheelchair     Assist Will patient use wheelchair at discharge?: No(no LT goals set for discharge) Type of Wheelchair: Manual    Wheelchair assist level: Independent Max wheelchair distance: 150'    Wheelchair 50 feet with 2 turns activity    Assist    Wheelchair 50 feet with 2 turns activity did not occur: Safety/medical concerns(R hemi, required skilled interventions for safe technique)   Assist Level: Independent   Wheelchair 150 feet activity     Assist Wheelchair 150 feet activity did not occur: Safety/medical concerns(R hemi, required skilled interventions for safe technique)   Assist Level: Independent      Medical Problem List and Plan: 1.  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    Notes reviewed from [facility]-appreciate recs, plan for stenting on 2/15 as outpatient   Continue CIR   WHO/PRAFO qhs 2.  Antithrombotics: -DVT/anticoagulation: Lovenox   CBC within normal limits on 2/4             -antiplatelet therapy: Aspirin 325 mg daily, Plavix 75 mg daily x3 months then Plavix alone 3. Pain Management: Tylenol as needed 4. Mood: Provide emotional support              -antipsychotic agents: N/A 5. Neuropsych: This patient is capable of making decisions on her own behalf. 6. Skin/Wound Care: Routine skin checks 7. Fluids/Electrolytes/Nutrition: Routine in and outs 8.  Essential hypertension: Patient on Norvasc 10 mg daily and cadestran-HCTZ 32-25 mg daily prior to admission.  Resume as needed            Relatively controlled on 2/4  Monitor with increased mobility 9.  Hypothyroidism.  Continue Synthroid 10.  Hyperlipidemia: Continue Lipitor 11.  Post stroke dysphagia            Diet advanced to regular thins 12.  Transaminitis: Resolved  LFTs within normal limits on 2/1 13.  Hypoalbuminemia  Supplement initiated on 1/28 14.  Prediabetes  Elevated 2/3  Monitor with increased mobility 15.  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

## 2019-06-28 NOTE — Progress Notes (Signed)
Physical Therapy Weekly Progress Note  Patient Details  Name: Megan Martinez MRN: [id number] Date of Birth: July 26, 1950  Beginning of progress report period: June 21, 2019 End of progress report period: June 28, 2019  Today's Date: 06/28/2019 PT Individual Time: 0808-0907 PT Individual Time Calculation (min): 59 min   Patient has met 0 of 3 short term goals. Megan Martinez is progressing well with therapy demonstrating improving R LE strength and motor control as well as improving standing balance and trunk control. She is now performing bed mobility with CGA, functional transfers using RW with CGA/min assist, ambulating up to 132f using RW with min assist, and ascending/descending 4 steps with mod assist. Patient continues to demonstrate lack of active ankle DF resulting in need for AFO/ACE wrap support for improved gait mechanics as well as inadequate R quad muscle control/activation resulting in intermittent knee hyperextension.  Patient continues to demonstrate the following deficits muscle weakness, muscle joint tightness and muscle paralysis, decreased cardiorespiratoy endurance, impaired timing and sequencing, abnormal tone, unbalanced muscle activation and decreased motor planning,   and decreased sitting balance, decreased standing balance, decreased postural control, hemiplegia and decreased balance strategies and therefore will continue to benefit from skilled PT intervention to increase functional independence with mobility.  Patient progressing toward long term goals..  Continue plan of care.  PT Short Term Goals Week 1:  PT Short Term Goal 1 (Week 1): Patient will perform basic transfers with close supervision using LRAD. PT Short Term Goal 1 - Progress (Week 1): Progressing toward goal PT Short Term Goal 2 (Week 1): Patient will ambulate >100 feet using LRAD with CGA. PT Short Term Goal 2 - Progress (Week 1): Progressing toward goal PT Short Term Goal 3 (Week 1): Patient will  perform dynamic standing balance activities with CGA without UE support. PT Short Term Goal 3 - Progress (Week 1): Progressing toward goal Week 2:  PT Short Term Goal 1 (Week 2): = to LTGs based on ELOS  Skilled Therapeutic Interventions/Progress Updates:  Ambulation/gait training;Discharge planning;Functional mobility training;Psychosocial support;Therapeutic Activities;Visual/perceptual remediation/compensation;Balance/vestibular training;Disease management/prevention;Neuromuscular re-education;Skin care/wound management;Therapeutic Exercise;Wheelchair propulsion/positioning;DME/adaptive equipment instruction;Pain management;Splinting/orthotics;UE/LE Strength taining/ROM;Community reintegration;Functional electrical stimulation;Patient/family education;Stair training;UE/LE Coordination activities Pt received sitting on toilet with NT present - pt agreeable to therapy session and therapist assumed care of patient. Sitting on toilet donned pants with mod assist for threading LEs and donned shoes max assist for time management. Stand pivot to w/c using grab bars and arm rests with min assist for balance. Transported to/from gym in w/c for time management. Donned R LE ankle DF assist ACE wrap. Gait training ~934fusing RW with min assist for balance focusing on R LE NMR for swing and stance control with pt demonstrating adequate R LE foot clearance with ACE wrap, increased R hip external rotation, decreased R LE step length, very rare R knee hyperextension during stance, and slow gait speed. Education and cuing for proper set-up and sequencing of sit<>stand and stand pivot transfers for improved AD management, R hand placement on/off RW orthotic, and increased pt safety to turn fully prior to sitting. Repeated sit<>stands, no UE support, 2x15 reps with mirror feedback and multimodal cuing for R lateral weight shift. Repeated standing mini-squats without UE support with mirror feedback targeting improved R knee  control to avoid hyperextension x10 reps. Sit>prone with min assist for R LE management. Performed prone R LE active assisted hamstring curls x10reps with pt continuing to demonstrate only trace muscle activation. Supine R LE heel slides targeting increased  hamstring activation x15 reps with cuing to maintain heel contact on mat. Standing<>tall kneeling on mat with min/mod assist for balance and R LE positioning on/off mat. In tall kneeling, with therapist facilitating R UE weightbearing on bench pt performed repeated hip flexion/extension targeting glute activation and trunk control/balance with min assist. In tall kneeling with B UE support on bench for balance performed R LE and L LE hip abduction/adduction to step knee in/out x10each with min assist for balance and multimodal cuing for increased trunk/hip extension for upright posture. Gait training ~119f using RW (ACE wrap donned for DF assist) and min assist for balance targeting R LE NMR for stance and swing phase gait mechanics with pt continuing to demonstrate adequate foot clearance, min decreased step length, and a few instances of R knee hyperextension in stance but with cuing able to improve. Transported back to room in w/c and hand-off to Megan Martinez to assume care of patient.   Therapy Documentation Precautions:  Precautions Precautions: Fall Precaution Comments: right hemiparesis Restrictions Weight Bearing Restrictions: No Other Position/Activity Restrictions: RLE ace wrapped into dorsiflexion.  Pain: Had some "discomfort" with R knee popping with some activities but says she has had chronic knee problems and this does not limit her participation with therapy - adjusted/modified interventions to avoid the discomfort.   Therapy/Group: Individual Therapy  CTawana Scale PT, DPT 06/28/2019, 7:55 AM

## 2019-06-28 NOTE — Evaluation (Signed)
Recreational Therapy Assessment and Plan  Patient Details  Name: Megan Martinez MRN: [id number] Date of Birth: Apr 02, 1951 Today's Date: 06/28/2019  Rehab Potential: Good ELOS: Discharge 2/11  Assessment Clinical Impression:   List:      Patient Active Problem List   Diagnosis Date Noted  . Transaminitis   . Hypoalbuminemia due to protein-calorie malnutrition ([place])   . Labile blood pressure   . Left pontine cerebrovascular accident ([place]) 06/20/2019  . Acute ischemic stroke ([place])   . Right hemiparesis ([place])   . Dysphagia, post-stroke   . Basilar artery stenosis with infarction [facility]) s/p tPA 06/19/2019  . Stroke (cerebrum) ([place]) L pontine d/t BA stenosis 06/14/2019  . Subcutaneous nodule of left foot 01/03/2019  . Atopic dermatitis 10/18/2018  . Seasonal allergies 08/09/2018  . 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 Osteoarthritis of carpometacarpal (CMC) joint of thumb 11/07/2017  . Acquired hypothyroidism 08/04/2015  . Essential hypertension 08/04/2015  . GERD without esophagitis 08/04/2015  . Mixed hyperlipidemia 08/04/2015  . Osteopenia 08/04/2015  . Primary insomnia 08/04/2015  . Pure hypertriglyceridemia 08/04/2015    Past Medical History:      Past Medical History:  Diagnosis Date  . Allergy   . Arthritis   . Hypertension   . Thyroid disease    Past Surgical History:       Past Surgical History:  Procedure Laterality Date  . ABDOMINAL HYSTERECTOMY    . APPENDECTOMY    . CATARACT EXTRACTION    . CHOLECYSTECTOMY    . TUBAL LIGATION      Assessment & Plan Clinical Impression: Patient is a 69 y.o. year old right-handed female with history of hypertension. History taken from chart review and patient. 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 Presented 06/14/2019 with right-sided weakness and dysarthria. Cranial CT unremarkable for acute  intracranial process. Patient did receive TPA. 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 Moderate proximal PCA stenosis bilaterally. 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 Echocardiogram with ejection fraction of 65% without emboli. Admission chemistries with potassium 3.0, urine drug screen negative. Neurology follow-up maintained on aspirin and Plavix for CVA prophylaxis x3 months then Plavix alone. Subcutaneous Lovenox for DVT prophylaxis. Tolerating mechanical soft diet. Maintain on Cardene drip initially for blood pressure control. 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 Therapy evaluations completed and patient was admitted for a comprehensive rehab program.  Patient transferred to CIR on 06/20/2019 .   Pt presents with decreased activity tolerance, decreased functional mobility, decreased balance, decreased coordination, reported feelings of stress/anxiety Limiting pt's independence with leisure/community pursuits.  Met with pt to discuss leisure interests, activity analysis identifying potential modifications and relaxation training as pt states she stays stressed.  Pt familiar with diaphragmatic breathing and guided imagery.  Plan to review specific techniques during next scheduled session.   Plan Min 1 TR session >25 minutes during LOS  Recommendations for other services: None   Discharge Criteria: Patient will be discharged from TR if patient refuses treatment 3 consecutive times without medical reason.  If treatment goals not met, if there is a change in medical status, if patient makes no  progress towards goals or if patient is  discharged from hospital.  The above assessment, treatment plan, treatment alternatives and goals were discussed and mutually agreed upon: by patient  [place] 06/28/2019, 11:42 AM

## 2019-06-28 NOTE — Progress Notes (Signed)
Occupational Therapy Session Note  Patient Details  Name: Megan Martinez MRN: [id number] Date of Birth: 1950-06-01  Today's Date: 06/28/2019 OT Individual Time: [id number] OT Individual Time Calculation (min): 80 min    Short Term Goals: Week 1:  OT Short Term Goal 1 (Week 1): Pt will peform toileting with min A with extra time OT Short Term Goal 2 (Week 1): Pt will perform LB dressing with mod A including footware OT Short Term Goal 3 (Week 1): Pt will perform one grooming task in standing with min A OT Short Term Goal 4 (Week 1): Pt will perform bed mobility in prep for ADL with min A wtih cues  Skilled Therapeutic Interventions/Progress Updates:    Pt received in wc and ready for therapy. Self propelled to gym and transferred to mat with min A.   This session focused on RUE NMR with estim, a/arom, guided movements and wt bearing on R hand. Utilized small gym ball for rolling forward and back and out to the side for activation of deltoids. B hands on ball for activation of biceps, pushing ball away for triceps.  Pt has about 10% of sh, bicep and tricep AROM which is quite improved from her flaccid state on admission.  estim with saebo stim one for wrist and finger extension for 30 min for FES.  Worked on assisted grasp and release with cones and dowel bar exercises.  Slight tone in finger flexors.    pt participated well and puts in great effort.  Returned to room and set up with belt alarm.     Therapy Documentation Precautions:  Precautions Precautions: Fall Precaution Comments: right hemiparesis Restrictions Weight Bearing Restrictions: No Other Position/Activity Restrictions: RLE ace wrapped into dorsiflexion.   Pain: Pain Assessment Pain Score: 0-No pain    Therapy/Group: Individual Therapy  [place] 06/28/2019, 12:44 PM

## 2019-06-29 ENCOUNTER — Inpatient Hospital Stay (HOSPITAL_COMMUNITY): Payer: PPO | Admitting: Speech Pathology

## 2019-06-29 ENCOUNTER — Inpatient Hospital Stay (HOSPITAL_COMMUNITY): Payer: PPO | Admitting: Physical Therapy

## 2019-06-29 ENCOUNTER — Inpatient Hospital Stay (HOSPITAL_COMMUNITY): Payer: PPO | Admitting: Occupational Therapy

## 2019-06-29 NOTE — Progress Notes (Signed)
Speech Language Pathology Weekly Progress and Session Note  Patient Details  Name: Megan Martinez MRN: [id number] Date of Birth: 1951/04/16  Beginning of progress report period: June 21, 2019 End of progress report period: June 29, 2019  Today's Date: 06/29/2019 SLP Individual Time: 1346-1430 SLP Individual Time Calculation (min): 44 min  Short Term Goals: Week 1: SLP Short Term Goal 1 (Week 1): Patient will consume trials of regular textures with efficient mastication and complete oral clearance without overt s/s of aspiration with supervision level verbal cues over 2 sessions prior to upgrade. SLP Short Term Goal 1 - Progress (Week 1): Met SLP Short Term Goal 2 (Week 1): Patient will consume current diet with minimal overt s/s of aspiration with Mod I for use of swallowing compensatory strategies. SLP Short Term Goal 2 - Progress (Week 1): Met SLP Short Term Goal 3 (Week 1): Patient will utilize speech intelligibility strategies at the phrase level with Min A verbal cues to achieve ~75% intelligibility. SLP Short Term Goal 3 - Progress (Week 1): Met    New Short Term Goals: Week 2: SLP Short Term Goal 1 (Week 2): STG=LTG due to remaining length of stay  Weekly Progress Updates: Pt has made excllent functional gains and met 3 out of 3 short term goals this reporting period. Pt is currently Mod I-Supervision assist due to dysarthria and dysphagia. Pt is consuming regular diet with thin liquids, repeat swallow study completed this week and determine no need for compensatory strategies for airway protection anymore. Pt is also using safe swallow precautions Mod I. Pt is Supervision A for use of intelligibility strategies for communication at the conversation level. Her speech is ~80-85% intelligible. Pt education is ongoing; no family has been present for ST sessions. Pt would continue to benefit from skilled ST while inpatient in order to maximize functional independence and reduce burden  of care prior to discharge. Anticipate that pt will need 24/7 supervision at discharge and may benefit from outpatient ST follow up for dysarthria.      Intensity: Minumum of 1-2 x/day, 30 to 90 minutes Frequency: 3 to 5 out of 7 days Duration/Length of Stay: 07/05/19 Treatment/Interventions: Dysphagia/aspiration precaution training;Speech/Language facilitation;Therapeutic Activities;Environmental controls;Cueing hierarchy;Patient/family education;Functional tasks   Daily Session  Skilled Therapeutic Interventions: Pt was seen for skilled ST targeting dysphagia and speech goals. SLP provided skilled observation to assess tolerance of thin without chin tuck compensatory maneuver (new recommendations after recent MBSS). No overt s/sx aspiration observed and pt is using safe swallow strategied Mod I - dysphagia goals have been met for this admission. SLP further facilitated session with a 10 minute conversation level task, during which, Min A verbal cues were required for repetition using slower rate to increase intelligibility. Pt able to verbally recall 2/2 strategies she is focused on using for intelligibility (slow rate and overarticulate).During a sentence level barrier speech task, pt required reduced Supervision A verbal cues to achieve ~90% intelligibility. She has good awareness of need to focus on slower rate when speaking, however still requires to cues to implement it, particularly during less formal/unstructured conversational tasks. Pt left sitting in wheelchair with needs within reach. Continue per current plan of care.         Pain Pain Assessment Pain Score: 0-No pain  Therapy/Group: Individual Therapy  [place] Leas 06/29/2019, 7:24 AM

## 2019-06-29 NOTE — Progress Notes (Signed)
[facility].  Patient with known mid basilar artery stenosis with plans for endovascular revascularization in NIR Monday 07/09/2019 on an outpatient basis.  [id number] PRU 06/27/2019. Discussed with Dr. Estanislado Pandy who states no need for medication changes at this time- patient to continue taking Plavix 75 mg once daily and Aspirin 325 mg once daily. Schedulers to contact patient to set up outpatient procedure.  Please call NIR with questions/concerns.   Bea Graff Gerri Acre, PA-C 06/29/2019, 10:00 AM

## 2019-06-29 NOTE — Progress Notes (Signed)
Occupational Therapy Weekly Progress Note  Patient Details  Name: Megan Martinez MRN: [id number] Date of Birth: 1950-12-17  Beginning of progress report period: June 21, 2019 End of progress report period: June 29, 2019  Today's Date: 06/29/2019 OT Individual Time: 1120-1205 OT Individual Time Calculation (min): 45 min (unattended estim 45 min)   Patient has met 4 of 4 short term goals.  Pt is making excellent progress with her adaptive strategies to complete self care at a min A level. RUE shoulder and bicep/tricep active movement is developing.    Patient continues to demonstrate the following deficits: unbalanced muscle activation and decreased coordination and decreased standing balance, hemiplegia and decreased balance strategies and therefore will continue to benefit from skilled OT intervention to enhance overall performance with BADL and iADL.  Patient progressing toward long term goals..  Continue plan of care.  OT Short Term Goals Week 1:  OT Short Term Goal 1 (Week 1): Pt will peform toileting with min A with extra time OT Short Term Goal 1 - Progress (Week 1): Met OT Short Term Goal 2 (Week 1): Pt will perform LB dressing with mod A including footware OT Short Term Goal 2 - Progress (Week 1): Met OT Short Term Goal 3 (Week 1): Pt will perform one grooming task in standing with min A OT Short Term Goal 3 - Progress (Week 1): Met OT Short Term Goal 4 (Week 1): Pt will perform bed mobility in prep for ADL with min A wtih cues OT Short Term Goal 4 - Progress (Week 1): Met Week 2:  OT Short Term Goal 1 (Week 2): STGs = LTGs  Skilled Therapeutic Interventions/Progress Updates:    Pt seen this session to focus on developing RUE motor control. Worked on mat in gym with weight bearing, pushing up exercises from mat, pushing UE out and in and in circles for shoulder and tricep bicep activation. Finger flexion facilitation with hand over hand guiding with towel scrunches. No  wrist or finger activity yet. Placed estim for FES for 15 min with cone grasp and release activity and then 45 min of unattended estim.  Saebo Stim One 330 pulse width 35 Hz pulse rate On 8 sec/ off 8 sec Ramp up/ down 2 sec Symmetrical Biphasic wave form  Max intensity 179m at 500 Ohm load  Pt self propelled back to room. She tolerated estim well. In room with all needs met.  Therapy Documentation Precautions:  Precautions Precautions: Fall Precaution Comments: right hemiparesis Restrictions Weight Bearing Restrictions: No Other Position/Activity Restrictions: RLE ace wrapped into dorsiflexion.  Pain: Pain Assessment Pain Scale: 0-10 Pain Score: 0-No pain ADL: ADL Eating: Set up Grooming: Contact guard Where Assessed-Grooming: Standing at sink Upper Body Bathing: Setup Where Assessed-Upper Body Bathing: Shower Lower Body Bathing: Contact guard Where Assessed-Lower Body Bathing: Shower Upper Body Dressing: Minimal assistance Where Assessed-Upper Body Dressing: Wheelchair Lower Body Dressing: Minimal assistance Where Assessed-Lower Body Dressing: Wheelchair Toileting: Minimal assistance Where Assessed-Toileting: TGlass blower/designer MPsychiatric nurseMethod: SActuaryMethod: SLibrarian, academic Walk in sFacilities managerTransfer: MEnvironmental education officerMethod: Stand pivot   Therapy/Group: Individual Therapy  [place]2/09/2019, 1:09 PM

## 2019-06-29 NOTE — Progress Notes (Signed)
[place] PHYSICAL MEDICINE & REHABILITATION PROGRESS NOTE  Subjective/Complaints: Patient seen sitting up in her chair this AM.  She states she slept well overnight.  She notes slow improvement in right upper extremity.   ROS: Denies CP, shortness of breath, nausea, vomiting, diarrhea.  Objective: Vital Signs: Blood pressure 131/61, pulse (!) 50, temperature 98.3 F (36.8 C), resp. rate 19, height 5\' 2"  (1.575 m), weight 72.8 kg, SpO2 96 %. DG Swallowing Func-Speech Pathology  Result Date: 06/27/2019 Objective Swallowing Evaluation: Type of Study: MBS-Modified Barium Swallow Study  Patient Details Name: Megan Martinez MRN: [id number] Date of Birth: Mar 10, 1951 Today's Date: 06/27/2019 Time: SLP Start Time : 0902 -SLP Stop Time : 0915 SLP Time Calculation (min): 13 min Past Medical History: Past Medical History: Diagnosis Date . Allergy  . Arthritis  . Hypertension  . Thyroid disease  Past Surgical History: Past Surgical History: Procedure Laterality Date . ABDOMINAL HYSTERECTOMY   . APPENDECTOMY   . CATARACT EXTRACTION   . CHOLECYSTECTOMY   . TUBAL LIGATION   HPI: 69 y.o. female with a history of hypertension admitted with right sided hemiparesis and facial asymmetry; received IV TPA.  Dx acute left paramedian pontine CVA. Pt admitted to [facility] 06/20/19.  Subjective: alert, cooporative Assessment / Plan / Recommendation CHL IP CLINICAL IMPRESSIONS 06/27/2019 Clinical Impression Pt presents with improved ability to protect her airway with thin liquids without need for use of any compensatory strategies since last MBSS 06/14/19. Pt has a hx of silent aspiration, and previous instrumental study recommended chin tuck maneuver to prevent airway intrusion with thins. However, today no penetration or aspiration was observed throughout several trials of thin barium via cup and straw, or when consuming thin with barium pill. Pt's oral phase was only remarkable for reduced posterior propulsion of large chalky barium pill,  and reduced cohesion of dual consistency thin with pill bolus. Given improvements in oropharyngeal swallow function, recommend pt continue current regular/thin diet and there is no longer a need for pt to perform chin tuck with liquids. Pt may consume small pills whole with thins, however large pills should either be broken to take with thin and/or consumed whole in applesauce (based on pt preference). SLP Visit Diagnosis Dysphagia, unspecified (R13.10) Attention and concentration deficit following -- Frontal lobe and executive function deficit following -- Impact on safety and function Mild aspiration risk   CHL IP TREATMENT RECOMMENDATION 06/15/2019 Treatment Recommendations Therapy as outlined in treatment plan below   Prognosis 06/27/2019 Prognosis for Safe Diet Advancement Good Barriers to Reach Goals -- Barriers/Prognosis Comment -- CHL IP DIET RECOMMENDATION 06/27/2019 SLP Diet Recommendations Regular solids;Thin liquid Liquid Administration via Cup;Straw Medication Administration Whole meds with liquid Compensations Minimize environmental distractions;Slow rate;Small sips/bites Postural Changes Remain semi-upright after after feeds/meals (Comment)   CHL IP OTHER RECOMMENDATIONS 06/27/2019 Recommended Consults -- Oral Care Recommendations Oral care BID Other Recommendations --   CHL IP FOLLOW UP RECOMMENDATIONS 06/18/2019 Follow up Recommendations Inpatient Rehab   CHL IP FREQUENCY AND DURATION 06/15/2019 Speech Therapy Frequency (ACUTE ONLY) min 2x/week Treatment Duration 2 weeks      CHL IP ORAL PHASE 06/27/2019 Oral Phase Impaired Oral - Pudding Teaspoon -- Oral - Pudding Cup -- Oral - Honey Teaspoon -- Oral - Honey Cup -- Oral - Nectar Teaspoon -- Oral - Nectar Cup -- Oral - Nectar Straw -- Oral - Thin Teaspoon -- Oral - Thin Cup WFL Oral - Thin Straw WFL Oral - Puree NT Oral - Mech Soft NT Oral -  Regular -- Oral - Multi-Consistency -- Oral - Pill Lingual pumping;Reduced posterior propulsion Oral Phase - Comment --   CHL IP PHARYNGEAL PHASE 06/27/2019 Pharyngeal Phase WFL Pharyngeal- Pudding Teaspoon -- Pharyngeal -- Pharyngeal- Pudding Cup -- Pharyngeal -- Pharyngeal- Honey Teaspoon -- Pharyngeal -- Pharyngeal- Honey Cup -- Pharyngeal -- Pharyngeal- Nectar Teaspoon -- Pharyngeal -- Pharyngeal- Nectar Cup -- Pharyngeal -- Pharyngeal- Nectar Straw -- Pharyngeal -- Pharyngeal- Thin Teaspoon -- Pharyngeal -- Pharyngeal- Thin Cup WFL Pharyngeal -- Pharyngeal- Thin Straw WFL Pharyngeal -- Pharyngeal- Puree NT Pharyngeal -- Pharyngeal- Mechanical Soft -- Pharyngeal -- Pharyngeal- Regular -- Pharyngeal -- Pharyngeal- Multi-consistency -- Pharyngeal -- Pharyngeal- Pill WFL Pharyngeal -- Pharyngeal Comment --  CHL IP CERVICAL ESOPHAGEAL PHASE 06/27/2019 Cervical Esophageal Phase WFL Pudding Teaspoon -- Pudding Cup -- Honey Teaspoon -- Honey Cup -- Nectar Teaspoon -- Nectar Cup -- Nectar Straw -- Thin Teaspoon -- Thin Cup -- Thin Straw -- Puree -- Mechanical Soft -- Regular -- Multi-consistency -- Pill -- Cervical Esophageal Comment -- Arbutus Leas 06/27/2019, 9:38 AM              Recent Labs    06/28/19 0601  WBC 6.3  HGB 12.9  HCT 39.2  PLT 236   Recent Labs    06/27/19 1023  NA 143  K 3.7  CL 107  CO2 27  GLUCOSE 128*  BUN 15  CREATININE 0.92  CALCIUM 9.2    Physical Exam: BP 131/61 (BP Location: Left Arm)   Pulse (!) 50   Temp 98.3 F (36.8 C)   Resp 19   Ht 5\' 2"  (1.575 m)   Wt 72.8 kg   SpO2 96%   BMI 29.34 kg/m  Constitutional: No distress . Vital signs reviewed. HENT: Normocephalic.  Atraumatic. Eyes: EOMI. No discharge. Cardiovascular: No JVD. Respiratory: Normal effort.  No stridor. GI: Non-distended. Skin: Warm and dry.  Intact. Psych: Normal mood.  Normal behavior. Musc: No edema in extremities.  No tenderness in extremities. Neurological: Alert Dysarthria, stable She makes eye contact with examiner and follows simple commands.   Appears to have fair insight and awareness. Motor: RUE:  Shoulder abduction, elbow flexion/extension 1/5, distally 0/5, slight improvement RLE: Hip flexion, knee extension 4/5, ankle dorsiflexion 0/5, stable, stable No increase in tone  Assessment/Plan: 1. Functional deficits secondary to left pontine infarct which require 3+ hours per day of interdisciplinary therapy in a comprehensive inpatient rehab setting.  Physiatrist is providing close team supervision and 24 hour management of active medical problems listed below.  Physiatrist and rehab team continue to assess barriers to discharge/monitor patient progress toward functional and medical goals  Care Tool:  Bathing    Body parts bathed by patient: Right arm, Chest, Abdomen, Front perineal area, Buttocks, Right upper leg, Left upper leg, Right lower leg, Left lower leg, Face, Left arm   Body parts bathed by helper: Left arm     Bathing assist Assist Level: Contact Guard/Touching assist     Upper Body Dressing/Undressing Upper body dressing   What is the patient wearing?: Pull over shirt, Bra    Upper body assist Assist Level: Minimal Assistance - Patient > 75%    Lower Body Dressing/Undressing Lower body dressing    Lower body dressing activity did not occur: Safety/medical concerns What is the patient wearing?: Pants     Lower body assist Assist for lower body dressing: Minimal Assistance - Patient > 75%     Toileting Toileting    Toileting assist Assist for  toileting: Minimal Assistance - Patient > 75%     Transfers Chair/bed transfer  Transfers assist  Chair/bed transfer activity did not occur: Safety/medical concerns  Chair/bed transfer assist level: Contact Guard/Touching assist Chair/bed transfer assistive device: Programmer, multimedia   Ambulation assist      Assist level: Minimal Assistance - Patient > 75% Assistive device: Walker-rolling Max distance: 1109ft   Walk 10 feet activity   Assist     Assist level: Minimal Assistance -  Patient > 75% Assistive device: Walker-rolling   Walk 50 feet activity   Assist Walk 50 feet with 2 turns activity did not occur: Safety/medical concerns(R hemi, decreased strength/balance)  Assist level: Minimal Assistance - Patient > 75% Assistive device: Walker-rolling    Walk 150 feet activity   Assist Walk 150 feet activity did not occur: Safety/medical concerns(R hemi, decreased strength/balance)  Assist level: Total Assistance - Patient < 25% Assistive device: Lite Gait    Walk 10 feet on uneven surface  activity   Assist Walk 10 feet on uneven surfaces activity did not occur: Safety/medical concerns(R hemi, decreased strength/balance)         Wheelchair     Assist Will patient use wheelchair at discharge?: No(no LT goals set for discharge) Type of Wheelchair: Manual    Wheelchair assist level: Independent Max wheelchair distance: 150'    Wheelchair 50 feet with 2 turns activity    Assist    Wheelchair 50 feet with 2 turns activity did not occur: Safety/medical concerns(R hemi, required skilled interventions for safe technique)   Assist Level: Independent   Wheelchair 150 feet activity     Assist Wheelchair 150 feet activity did not occur: Safety/medical concerns(R hemi, required skilled interventions for safe technique)   Assist Level: Independent      Medical Problem List and Plan: 1.  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    Notes reviewed from [facility]-appreciate recs, plan for stenting on 2/15 as outpatient   Continue CIR   WHO/PRAFO qhs 2.  Antithrombotics: -DVT/anticoagulation: Lovenox   CBC within normal limits on 2/4             -antiplatelet therapy: Aspirin 325 mg daily, Plavix 75 mg daily x3 months then Plavix alone 3. Pain Management: Tylenol as needed 4. Mood: Provide emotional support             -antipsychotic agents: N/A 5.  Neuropsych: This patient is capable of making decisions on her own behalf. 6. Skin/Wound Care: Routine skin checks 7. Fluids/Electrolytes/Nutrition: Routine in and outs 8.  Essential hypertension: Patient on Norvasc 10 mg daily and cadestran-HCTZ 32-25 mg daily prior to admission.  Resume as needed            Slightly labile on 2/5  Monitor with increased mobility 9.  Hypothyroidism.  Continue Synthroid 10.  Hyperlipidemia: Continue Lipitor 11.  Post stroke dysphagia            Diet advanced to regular thins 12.  Transaminitis: Resolved  LFTs within normal limits on 2/1 13.  Hypoalbuminemia  Supplement initiated on 1/28 14.  Prediabetes  Elevated 2/3  Monitor with increased mobility 15.  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

## 2019-06-29 NOTE — Progress Notes (Signed)
Physical Therapy Session Note  Patient Details  Name: Megan Martinez MRN: [id number] Date of Birth: 06/03/1950  Today's Date: 06/29/2019 PT Individual Time: [id number] and [id number] PT Individual Time Calculation (min): 60 min and 43 min   Short Term Goals: Week 2:  PT Short Term Goal 1 (Week 2): = to LTGs based on ELOS  Skilled Therapeutic Interventions/Progress Updates:    Session 1: Pt received sitting in w/c and agreeable to therapy session.  Transported to/from gym in w/c for energy conservation. Pt reports overall feeling more fatigued today. Pt reports that she has 1 step-up to get into the home and then bedroom/bathroom are on that level with the washer/dryer in basement but that she will have her daughter assist with laundry - pt also confirms that her daughter and boyfriend will be providing 24hr support upon D/C. Therapist donned toe-up brace to trial as opposed to AFO. Gait training ~46ft using RW with min assist for balance and toe up brace not adequately lifting into ankle DF causing consistent R toe drag during swing and this morning pt demonstrating increased R knee hyperextension during stance - cuing throughout for improvement of gait mechanics. Therapist tried readjusting toe-up brace lower in the shoe to see if it would improve ankle DF - gait training ~30ft using RW with min assist for balance - demonstrates slight improvement of toe clearance but intermittent toe catch noted and pt continues to demonstrate frequent knee hyperextension during stance despite cuing for correction. Doffed toe-up brace and donned ACE wrap. Sit<>stands using RW with CGA for steadying throughout session. Standing with UE support on litegait donned harness with CGA for steadying. While in litegait harness stepped on/off treadmill with min assist for balance and cuing for sequencing. Performed the following locomotor treadmill training trails with R UE ACE wrapped to litegait for weightbearing, R LE ankle DF  ACE wrap, and harness for safety but not providing body weight support: Trial 1: 31min36seconds at 0.18mph increased to 0.32mph totaling 181ft with therapist providing min assist for R LE gait mechanics to improve toe clearance and block knee hyperextension but pt not able to reach a symmetrical, fluid gait reporting something "feels off" - again reports feeling tired today but eager to try again and see if she can improve her gait Vitals assessed: BP 145/76 (MAP 97), HR 60bpm, SpO2 97% Trial 2: 35min11seconds at 0.34mph increased to 0.30mph totaling 228ft with therapist again providing min assist for improved R LE toe clearance and max multimodal cuing for symmetrical, equal, step-through gait and therapist blocking knee hyperextension though occurring much less frequently compared to overground. Stepped off treadmill and doffed harness as described above. Transported back to room and left seated in w/c with needs in reach and seat belt alarm on.  Session 2: Pt received sitting in w/c and agreeable to therapy session. Pt reports that no-one has put her seat belt alarm on today and she performed toileting without nursing assistance - therapist educated her on importance of calling for assistance to ensure her safety. Transported to/from gym in w/c for time management. Therapist donned medial strut R LE PLS AFO to continue problem solving most appropriate bracing to improve ankle DF during swing while also not increasing knee hyperextension during stance phase. Gait training ~60ft using RW with min assist - pt demonstrates improved ankle DF; however, worsening knee hyperextension. Donned small 3/8ths heel wedge under brace to promote knee flexion. Gait training ~60ft using RW with pt demonstrating slight improvement in limiting knee  hyperextension but still not adequate. Donned R LE Swedish knee cage to truly block hyperextension and gait training ~25ft using RW with min assist - pt's gait pattern looked  significantly better with improved ankle DF for foot clearance and the knee cage blocking hyperextension. Anticipate the cause of knee hyperextension is due to continued significantly impaired R hamstring muscle activation because it is not able to limited quad muscle contraction causing over straightening of the knee. Attempted seated R LE w/c propulsion to target hamstring activation but pt only able to minimally pull w/c forward with assistance. Attempted standing with B UE support on RW R LE hamstring curls with mirror feedback and min assist for balance - pt able to create contraction but not sufficient enough to cause movement x10 reps. Transitioned to seated R LE heel slides with wash clothe to decrease resistance x10 reps and with significantly increased time pt able to move washcloth through small excursions. Therapist educated pt on performing supine heel slides 2x15reps, seated heel slides x10reps, and seated hip flexion x10reps all on R LE outside of therapy sessions to target hamstring and hip flexor muscle activation - pt educated to perform these even if minimal to no movement actually occurs. Transported back to room and left seated in w/c with needs in reach and seat belt alarm on with reinforcement of need to call for assistance for mobility.   Therapy Documentation Precautions:  Precautions Precautions: Fall Precaution Comments: right hemiparesis Restrictions Weight Bearing Restrictions: No Other Position/Activity Restrictions: RLE ace wrapped into dorsiflexion.  Pain:   Session 1: Denies pain during session.  Session 2: Denies pain during session.   Therapy/Group: Individual Therapy  Tawana Scale, PT, DPT 06/29/2019, 7:51 AM

## 2019-06-29 NOTE — Plan of Care (Signed)
  Problem: RH Expression Communication Goal: LTG Patient will increase speech intelligibility (SLP) Description: LTG: Patient will increase speech intelligibility at word/phrase/conversation level with cues, % of the time (SLP) Flowsheets (Taken 06/29/2019 1241) LTG: Patient will increase speech intelligibility (SLP): Modified Independent  Upgraded due to excellent progress

## 2019-06-30 ENCOUNTER — Inpatient Hospital Stay (HOSPITAL_COMMUNITY): Payer: PPO | Admitting: Physical Therapy

## 2019-06-30 NOTE — Progress Notes (Signed)
Physical Therapy Session Note  Patient Details  Name: Megan Martinez MRN: [id number] Date of Birth: 1951-03-17  Today's Date: 06/30/2019 PT Individual Time: [id number] PT Individual Time Calculation (min): 40 min   Short Term Goals: Week 1:  PT Short Term Goal 1 (Week 1): Patient will perform basic transfers with close supervision using LRAD. PT Short Term Goal 1 - Progress (Week 1): Progressing toward goal PT Short Term Goal 2 (Week 1): Patient will ambulate >100 feet using LRAD with CGA. PT Short Term Goal 2 - Progress (Week 1): Progressing toward goal PT Short Term Goal 3 (Week 1): Patient will perform dynamic standing balance activities with CGA without UE support. PT Short Term Goal 3 - Progress (Week 1): Progressing toward goal  Skilled Therapeutic Interventions/Progress Updates:  Pt was seen bedside in the am. Pt propelled w/c to rehab gym with I with L UE and LE. Pt transferred sit to stand with rolling walker and c/g. Pt performed multiple sit to stand transfers with [facility] and S with verbal cues. Pt performed multiple stand pivot transfers with [facility] and S to c/g with verbal cues. Pt ambulated with rolling walker about 50 feet with R AFO and hand orthosis with c/g and verbal cues. Pt ambulated 50 feet x 2 with SBQC and c/g with verbal cues. Pt propelled w/c back to room I[place]. Pt left sitting up in w/c with chair alarm in place and call bell within reach.   Therapy Documentation Precautions:  Precautions Precautions: Fall Precaution Comments: right hemiparesis Restrictions Weight Bearing Restrictions: No Other Position/Activity Restrictions: RLE ace wrapped into dorsiflexion. General:   Vital Signs:  Pain: No c/o pain.   Therapy/Group: Individual Therapy  Dub Amis 06/30/2019, 12:07 PM

## 2019-06-30 NOTE — Progress Notes (Signed)
Physical Therapy Session Note  Patient Details  Name: Megan Martinez MRN: [id number] Date of Birth: July 08, 1950  Today's Date: 06/30/2019 PT Individual Time: 1410-1505 PT Individual Time Calculation (min): 55 min   Short Term Goals: Week 1:  PT Short Term Goal 1 (Week 1): Patient will perform basic transfers with close supervision using LRAD. PT Short Term Goal 1 - Progress (Week 1): Progressing toward goal PT Short Term Goal 2 (Week 1): Patient will ambulate >100 feet using LRAD with CGA. PT Short Term Goal 2 - Progress (Week 1): Progressing toward goal PT Short Term Goal 3 (Week 1): Patient will perform dynamic standing balance activities with CGA without UE support. PT Short Term Goal 3 - Progress (Week 1): Progressing toward goal Week 2:  PT Short Term Goal 1 (Week 2): = to LTGs based on ELOS  Skilled Therapeutic Interventions/Progress Updates:   Pt received sitting in WC and agreeable to PT. WC mobility throughout hall 2 x 272f with supervision assist and min cues for safety through doorways.   PT instructed pt in dynamic gait training with QC and RAFO to force improved trunk control and attention to RLE 2 x 548fwith min-CGA from PT for safety and min cues for QC placement and deceased step length on the LLE to prevent GR on the R.     Stair management training to perform LLE foot tap on 2 inch step, and 4 inch step 2 x 5 each height, UE support on QC. Min cues for neutral knee alignment on the R with step width on the L. Pt able to completed with only 1 instance of GR. Progressed to step up 4" and 6" step leading with LLE x 5 at each height in parallel bars with min assist and moderate cues for step-to gait pattern as well as improved knee/hip flexion and decreased circumduction on the R.   Pt then performed step up/down 6 inch step with QC, RAFO and givmohr sling x 6 with min assist overall, cues for QC placement, step to gait pattern and improved knee control with descent in the  RLE.  [facility] returned to [facility] and all needs met.           Therapy Documentation Precautions:  Precautions Precautions: Fall Precaution Comments: right hemiparesis Restrictions Weight Bearing Restrictions: No Other Position/Activity Restrictions: RLE ace wrapped into dorsiflexion. Vital Signs: Therapy Vitals Temp: 98 F (36.7 C) Pulse Rate: (!) 57 Resp: 18 BP: (!) 150/74 Patient Position (if appropriate): Sitting Oxygen Therapy SpO2: 97 % O2 Device: Room Air Pain: denies   Therapy/Group: Individual Therapy  AuLorie Phenix/10/2019, 3:10 PM

## 2019-06-30 NOTE — Progress Notes (Signed)
[place] PHYSICAL MEDICINE & REHABILITATION PROGRESS NOTE  Subjective/Complaints:   Pt reports her leg is recovering well- her arm is not- no significant movement so far. Does feel sensation in RUE but no movement.   Otherwise, is happy with therapy and nursing.  Sitting up in chair at bedside; sad/unhappy affect  ROS: Denies CP, shortness of breath, nausea, vomiting, diarrhea.  Objective: Vital Signs: Blood pressure 129/67, pulse (!) 51, temperature 98.3 F (36.8 C), resp. rate 19, height 5\' 2"  (1.575 m), weight 72.8 kg, SpO2 96 %. No results found. Recent Labs    06/28/19 0601  WBC 6.3  HGB 12.9  HCT 39.2  PLT 236   No results for input(s): NA, K, CL, CO2, GLUCOSE, BUN, CREATININE, CALCIUM in the last 72 hours.  Physical Exam: BP 129/67 (BP Location: Left Arm)   Pulse (!) 51   Temp 98.3 F (36.8 C)   Resp 19   Ht 5\' 2"  (1.575 m)   Wt 72.8 kg   SpO2 96%   BMI 29.34 kg/m  Constitutional: No distress . Vital signs reviewed. HENT: Normocephalic.  Atraumatic. Eyes: EOMI. No discharge. Cardiovascular: No JVD. Respiratory: Normal effort.  No stridor. GI: Non-distended. Skin: Warm and dry.  Intact. Psych: Normal mood.  Normal behavior. Musc: No edema in extremities.  No tenderness in extremities. Neurological: Alert Dysarthria, stable She makes eye contact with examiner and follows simple commands.   Appears to have fair insight and awareness. Motor: RUE: Shoulder abduction, elbow flexion/extension 1/5, distally 0/5, slight improvement RLE: Hip flexion, knee extension 4/5, ankle dorsiflexion 0/5, stable, stable No increase in tone Psychiatric: unhappy affect, but appropriate  Assessment/Plan: 1. Functional deficits secondary to left pontine infarct which require 3+ hours per day of interdisciplinary therapy in a comprehensive inpatient rehab setting.  Physiatrist is providing close team supervision and 24 hour management of active medical problems listed  below.  Physiatrist and rehab team continue to assess barriers to discharge/monitor patient progress toward functional and medical goals  Care Tool:  Bathing    Body parts bathed by patient: Right arm, Chest, Abdomen, Front perineal area, Buttocks, Right upper leg, Left upper leg, Right lower leg, Left lower leg, Face, Left arm   Body parts bathed by helper: Left arm     Bathing assist Assist Level: Contact Guard/Touching assist     Upper Body Dressing/Undressing Upper body dressing   What is the patient wearing?: Pull over shirt, Bra    Upper body assist Assist Level: Minimal Assistance - Patient > 75%    Lower Body Dressing/Undressing Lower body dressing    Lower body dressing activity did not occur: Safety/medical concerns What is the patient wearing?: Pants     Lower body assist Assist for lower body dressing: Minimal Assistance - Patient > 75%     Toileting Toileting    Toileting assist Assist for toileting: Minimal Assistance - Patient > 75%     Transfers Chair/bed transfer  Transfers assist  Chair/bed transfer activity did not occur: Safety/medical concerns  Chair/bed transfer assist level: Contact Guard/Touching assist Chair/bed transfer assistive device: Cane, Orthosis   Locomotion Ambulation   Ambulation assist      Assist level: Contact Guard/Touching assist Assistive device: Cane-quad Max distance: 50   Walk 10 feet activity   Assist     Assist level: Contact Guard/Touching assist Assistive device: Cane-quad   Walk 50 feet activity   Assist Walk 50 feet with 2 turns activity did not occur: Safety/medical concerns(R hemi,  decreased strength/balance)  Assist level: Contact Guard/Touching assist Assistive device: Cane-quad    Walk 150 feet activity   Assist Walk 150 feet activity did not occur: Safety/medical concerns(R hemi, decreased strength/balance)  Assist level: Total Assistance - Patient < 25% Assistive device: Lite  Gait    Walk 10 feet on uneven surface  activity   Assist Walk 10 feet on uneven surfaces activity did not occur: Safety/medical concerns(R hemi, decreased strength/balance)         Wheelchair     Assist Will patient use wheelchair at discharge?: No Type of Wheelchair: Manual    Wheelchair assist level: Independent Max wheelchair distance: 150'    Wheelchair 50 feet with 2 turns activity    Assist    Wheelchair 50 feet with 2 turns activity did not occur: Safety/medical concerns(R hemi, required skilled interventions for safe technique)   Assist Level: Independent   Wheelchair 150 feet activity     Assist Wheelchair 150 feet activity did not occur: Safety/medical concerns(R hemi, required skilled interventions for safe technique)   Assist Level: Independent      Medical Problem List and Plan: 1.  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    Notes reviewed from [facility]-appreciate recs, plan for stenting on 2/15 as outpatient   Continue CIR   WHO/PRAFO qhs 2.  Antithrombotics: -DVT/anticoagulation: Lovenox   CBC within normal limits on 2/4             -antiplatelet therapy: Aspirin 325 mg daily, Plavix 75 mg daily x3 months then Plavix alone 3. Pain Management: Tylenol as needed 4. Mood: Provide emotional support             -antipsychotic agents: N/A 5. Neuropsych: This patient is capable of making decisions on her own behalf. 6. Skin/Wound Care: Routine skin checks 7. Fluids/Electrolytes/Nutrition: Routine in and outs 8.  Essential hypertension: Patient on Norvasc 10 mg daily and cadestran-HCTZ 32-25 mg daily prior to admission.  Resume as needed            Improved control on 2/6  Monitor with increased mobility 9.  Hypothyroidism.  Continue Synthroid 10.  Hyperlipidemia: Continue Lipitor 11.  Post stroke dysphagia            Diet advanced to regular thins 12.   Transaminitis: Resolved  LFTs within normal limits on 2/1 13.  Hypoalbuminemia  Supplement initiated on 1/28 14.  Prediabetes  Elevated 2/3  Monitor with increased mobility 15.  Likely CKD II  GFR >60 on 2/3  Continue to monitor   LOS: 10 days A FACE TO FACE EVALUATION WAS PERFORMED  Clif Serio 06/30/2019, 12:08 PM

## 2019-07-01 NOTE — Progress Notes (Signed)
PHYSICAL MEDICINE & REHABILITATION PROGRESS NOTE  Subjective/Complaints:   Pt reports no issues- getting ready to wash up at sink- "feels dirty".    ROS: Denies CP, shortness of breath, nausea, vomiting, diarrhea.  Objective: Vital Signs: Blood pressure (!) 146/58, pulse (!) 50, temperature 98.3 F (36.8 C), temperature source Oral, resp. rate 20, height 5\' 2"  (1.575 m), weight 72.8 kg, SpO2 95 %. No results found. No results for input(s): WBC, HGB, HCT, PLT in the last 72 hours. No results for input(s): NA, K, CL, CO2, GLUCOSE, BUN, CREATININE, CALCIUM in the last 72 hours.  Physical Exam: BP (!) 146/58 (BP Location: Left Arm)   Pulse (!) 50   Temp 98.3 F (36.8 C) (Oral)   Resp 20   Ht 5\' 2"  (1.575 m)   Wt 72.8 kg   SpO2 95%   BMI 29.34 kg/m  Constitutional: No distress . Vital signs reviewed. Sitting up in manual w/c, at sink; basically no movement in RUE, NAD HENT: Normocephalic.  Atraumatic. Eyes: EOMI. No discharge. Cardiovascular: No JVD. Respiratory: Normal effort.  No stridor. GI: Non-distended. Skin: Warm and dry.  Intact. Psych: Normal mood.  Normal behavior. Musc: No edema in extremities.  No tenderness in extremities. Neurological: Alert Dysarthria, stable She makes eye contact with examiner and follows simple commands.   Appears to have fair insight and awareness. Motor: RUE: Shoulder abduction, elbow flexion/extension 1/5, distally 0/5, slight improvement RLE: Hip flexion, knee extension 4/5, ankle dorsiflexion 0/5, stable, stable No increase in tone Psychiatric: unhappy affect, but appropriate  Assessment/Plan: 1. Functional deficits secondary to left pontine infarct which require 3+ hours per day of interdisciplinary therapy in a comprehensive inpatient rehab setting.  Physiatrist is providing close team supervision and 24 hour management of active medical problems listed below.  Physiatrist and rehab team continue to assess barriers  to discharge/monitor patient progress toward functional and medical goals  Care Tool:  Bathing    Body parts bathed by patient: Right arm, Chest, Abdomen, Front perineal area, Buttocks, Right upper leg, Left upper leg, Right lower leg, Left lower leg, Face, Left arm   Body parts bathed by helper: Left arm     Bathing assist Assist Level: Contact Guard/Touching assist     Upper Body Dressing/Undressing Upper body dressing   What is the patient wearing?: Pull over shirt, Bra    Upper body assist Assist Level: Minimal Assistance - Patient > 75%    Lower Body Dressing/Undressing Lower body dressing    Lower body dressing activity did not occur: Safety/medical concerns What is the patient wearing?: Pants     Lower body assist Assist for lower body dressing: Minimal Assistance - Patient > 75%     Toileting Toileting    Toileting assist Assist for toileting: Minimal Assistance - Patient > 75%     Transfers Chair/bed transfer  Transfers assist  Chair/bed transfer activity did not occur: Safety/medical concerns  Chair/bed transfer assist level: Contact Guard/Touching assist Chair/bed transfer assistive device: Cane, Orthosis   Locomotion Ambulation   Ambulation assist      Assist level: Contact Guard/Touching assist Assistive device: Cane-quad Max distance: 50   Walk 10 feet activity   Assist     Assist level: Contact Guard/Touching assist Assistive device: Cane-quad   Walk 50 feet activity   Assist Walk 50 feet with 2 turns activity did not occur: Safety/medical concerns(R hemi, decreased strength/balance)  Assist level: Contact Guard/Touching assist Assistive device: Cane-quad    Walk 150  feet activity   Assist Walk 150 feet activity did not occur: Safety/medical concerns(R hemi, decreased strength/balance)  Assist level: Total Assistance - Patient < 25% Assistive device: Lite Gait    Walk 10 feet on uneven surface  activity   Assist  Walk 10 feet on uneven surfaces activity did not occur: Safety/medical concerns(R hemi, decreased strength/balance)         Wheelchair     Assist Will patient use wheelchair at discharge?: No Type of Wheelchair: Manual    Wheelchair assist level: Independent Max wheelchair distance: 150'    Wheelchair 50 feet with 2 turns activity    Assist    Wheelchair 50 feet with 2 turns activity did not occur: Safety/medical concerns(R hemi, required skilled interventions for safe technique)   Assist Level: Independent   Wheelchair 150 feet activity     Assist Wheelchair 150 feet activity did not occur: Safety/medical concerns(R hemi, required skilled interventions for safe technique)   Assist Level: Independent      Medical Problem List and Plan: 1.  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    Notes reviewed from [facility]-appreciate recs, plan for stenting on 2/15 as outpatient   Continue CIR   WHO/PRAFO qhs 2.  Antithrombotics: -DVT/anticoagulation: Lovenox   CBC within normal limits on 2/4             -antiplatelet therapy: Aspirin 325 mg daily, Plavix 75 mg daily x3 months then Plavix alone 3. Pain Management: Tylenol as needed 4. Mood: Provide emotional support             -antipsychotic agents: N/A 5. Neuropsych: This patient is capable of making decisions on her own behalf. 6. Skin/Wound Care: Routine skin checks 7. Fluids/Electrolytes/Nutrition: Routine in and outs 8.  Essential hypertension: Patient on Norvasc 10 mg daily and cadestran-HCTZ 32-25 mg daily prior to admission.  Resume as needed            Improved control on 2/6  2/7- BP is controlled ~ 140s/60s- however pulse is in 50s- currently 50; is asymptomatic, but will monitor for Sx's.   Monitor with increased mobility 9.  Hypothyroidism.  Continue Synthroid 10.  Hyperlipidemia: Continue Lipitor 11.  Post stroke  dysphagia            Diet advanced to regular thins 12.  Transaminitis: Resolved  LFTs within normal limits on 2/1 13.  Hypoalbuminemia  Supplement initiated on 1/28 14.  Prediabetes  Elevated 2/3  Monitor with increased mobility via BMP 15.  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

## 2019-07-02 ENCOUNTER — Inpatient Hospital Stay (HOSPITAL_COMMUNITY): Payer: PPO

## 2019-07-02 ENCOUNTER — Inpatient Hospital Stay (HOSPITAL_COMMUNITY): Payer: PPO | Admitting: Speech Pathology

## 2019-07-02 ENCOUNTER — Inpatient Hospital Stay (HOSPITAL_COMMUNITY): Payer: PPO | Admitting: Occupational Therapy

## 2019-07-02 LAB — COMPREHENSIVE METABOLIC PANEL
ALT: 25 U/L (ref 0–44)
AST: 18 U/L (ref 15–41)
Albumin: 3.2 g/dL — ABNORMAL LOW (ref 3.5–5.0)
Alkaline Phosphatase: 33 U/L — ABNORMAL LOW (ref 38–126)
Anion gap: 14 (ref 5–15)
BUN: 13 mg/dL (ref 8–23)
CO2: 26 mmol/L (ref 22–32)
Calcium: 9.1 mg/dL (ref 8.9–10.3)
Chloride: 104 mmol/L (ref 98–111)
Creatinine, Ser: 0.89 mg/dL (ref 0.44–1.00)
GFR calc Af Amer: >60 mL/min (ref >60)
GFR calc non Af Amer: >60 mL/min (ref >60)
Glucose, Bld: 128 mg/dL — ABNORMAL HIGH (ref 70–99)
Potassium: 3.6 mmol/L (ref 3.5–5.1)
Sodium: 144 mmol/L (ref 135–145)
Total Bilirubin: 0.5 mg/dL (ref 0.3–1.2)
Total Protein: 5.9 g/dL — ABNORMAL LOW (ref 6.5–8.1)

## 2019-07-02 LAB — CBC WITH DIFFERENTIAL/PLATELET
Abs Immature Granulocytes: 0.01 10*3/uL (ref 0.00–0.07)
Basophils Absolute: 0 10*3/uL (ref 0.0–0.1)
Basophils Relative: 1 %
Eosinophils Absolute: 0.2 10*3/uL (ref 0.0–0.5)
Eosinophils Relative: 4 %
HCT: 37.5 % (ref 36.0–46.0)
Hemoglobin: 12.3 g/dL (ref 12.0–15.0)
Immature Granulocytes: 0 %
Lymphocytes Relative: 30 %
Lymphs Abs: 1.6 10*3/uL (ref 0.7–4.0)
MCH: 28.9 pg (ref 26.0–34.0)
MCHC: 32.8 g/dL (ref 30.0–36.0)
MCV: 88 fL (ref 80.0–100.0)
Monocytes Absolute: 0.4 10*3/uL (ref 0.1–1.0)
Monocytes Relative: 7 %
Neutro Abs: 3.1 10*3/uL (ref 1.7–7.7)
Neutrophils Relative %: 58 %
Platelets: 268 10*3/uL (ref 150–400)
RBC: 4.26 MIL/uL (ref 3.87–5.11)
RDW: 13.3 % (ref 11.5–15.5)
WBC: 5.4 10*3/uL (ref 4.0–10.5)
nRBC: 0 % (ref 0.0–0.2)

## 2019-07-02 NOTE — Progress Notes (Signed)
Occupational Therapy Session Note  Patient Details  Name: Megan Martinez MRN: [id number] Date of Birth: 08/16/1950  Today's Date: 07/02/2019 OT Individual Time: 1530-1625 OT Individual Time Calculation (min): 55 min    Short Term Goals: Week 2:  OT Short Term Goal 1 (Week 2): STGs = LTGs  Skilled Therapeutic Interventions/Progress Updates:    Pt received sitting up in the w/c with no c/o pain. Pt agreeable to session focused on R UE NMR. Pt propelled w/c to the therapy gym via hemi technique with (S). Pt completed stand pivot transfer to the mat with CGA, with cueing for UE placement with orthosis. Pt transitioned into supine with CGA. Pt completed bicep/tricep and scapular protraction/retraction activation with slideboard held overhead. Mod facilitation provided. Pt completed L sidelying to promote visual gaze to RUE, with facilitation especially required for concentric control. Pt switched to R sidelying and with use of a beasy board completed gravity eliminated shoulder flexion/extension with support needed to provide enough full arm extension to put pressure into board. Pt returned to her room and was left sitting up with all needs met, chair pad alarm activated.   Therapy Documentation Precautions:  Precautions Precautions: Fall Precaution Comments: right hemiparesis Restrictions Weight Bearing Restrictions: No Other Position/Activity Restrictions: RLE ace wrapped into dorsiflexion.  Therapy/Group: Individual Therapy  Sandra H Davis 07/02/2019, 4:04 PM 

## 2019-07-02 NOTE — Progress Notes (Signed)
Occupational Therapy Session Note  Patient Details  Name: Megan Martinez MRN: [id number] Date of Birth: [birth year]  Today's Date: 07/02/2019 OT Individual Time: 0488-8916 OT Individual Time Calculation (min): 71 min   Short Term Goals: Week 2:  OT Short Term Goal 1 (Week 2): STGs = LTGs  Skilled Therapeutic Interventions/Progress Updates:    Pt greeted in w/c with no c/o pain. ADL needs met, ready to go. She verbalized that she is an avid cleaner at home, therefore tx focus was placed on dynamic standing balance and Rt NMR during meaningful IADL participation. After OT assisted her with donning Rt AFO, pt self propelled w/c to the ADL apartment using her Lt side. Pt then vacuumed the carpet with Min-Mod A for standing balance and Rt arm supported. She ambulated around room to meet demands of this task, bending outside of base of support to reach under the bed. She sat down to rest on couch, bed, and also movable recliner. Min-Mod A for transfers without device, steady assist when using RW. She reports having movable recliners in her home that she uses, OT suggested placing books beneath to decrease rock and minimize fall risk. Next had pt side-step Lt>Rt in front of kitchen counter while she used self ROM techniques to wash and dry counter with Rt hand. Worked on pt stepping vs sliding her Rt foot and increasing Rt weightshift when standing due to offloading tendencies. While seated in w/c, discussed LH cleaning supplies and pt practiced using the [facility] duster in the living area using w/c for propulsion. Vcs for techniques in regard to duster transport. With Rt hand ace wrapped to [facility] dustpan handle, pt then swept the kitchen area using several of her own adaptive techniques. Vcs throughout session for mindful occupation-based activation of Rt, as pt was Rt handed prior to CVA. Also educated pt on joint protection throughout as she tended to lift affected arm by fingers vs wrist. At end of session pt self  propelled back to the room in manner as stated above. She remained in w/c with all needs within reach and chair alarm set + half lap tray.     Therapy Documentation Precautions:  Precautions Precautions: Fall Precaution Comments: right hemiparesis Restrictions Weight Bearing Restrictions: No Other Position/Activity Restrictions: RLE ace wrapped into dorsiflexion. Pain: Pain Assessment Pain Scale: 0-10 Pain Score: 0-No pain ADL: ADL Eating: Set up Grooming: Contact guard Where Assessed-Grooming: Standing at sink Upper Body Bathing: Setup Where Assessed-Upper Body Bathing: Shower Lower Body Bathing: Contact guard Where Assessed-Lower Body Bathing: Shower Upper Body Dressing: Minimal assistance Where Assessed-Upper Body Dressing: Wheelchair Lower Body Dressing: Minimal assistance Where Assessed-Lower Body Dressing: Wheelchair Toileting: Minimal assistance Where Assessed-Toileting: Glass blower/designer: Psychiatric nurse Method: Actuary Method: Librarian, academic: Walk in Facilities manager Transfer: Environmental education officer Method: Stand pivot      Therapy/Group: Individual Therapy  Aayan Haskew A Kadijah Shamoon 07/02/2019, 12:29 PM

## 2019-07-02 NOTE — Plan of Care (Signed)
  Problem: Consults Goal: RH STROKE PATIENT EDUCATION Description: See Patient Education module for education specifics  Outcome: Progressing   Problem: RH BOWEL ELIMINATION Goal: RH STG MANAGE BOWEL WITH ASSISTANCE Description: STG Manage Bowel with mod I Assistance. Outcome: Progressing Flowsheets (Taken 07/02/2019 1541) STG: Pt will manage bowels with assistance: 3-Moderate assistance Goal: RH STG MANAGE BOWEL W/MEDICATION W/ASSISTANCE Description: STG Manage Bowel with Medication with mod I Assistance. Outcome: Progressing Flowsheets (Taken 07/02/2019 1541) STG: Pt will manage bowels with medication with assistance: 3-Moderate assistance   Problem: RH SKIN INTEGRITY Goal: RH STG SKIN FREE OF INFECTION/BREAKDOWN Description: Patients skin will remain free from further infection or breakdown with min assist. Outcome: Progressing Goal: RH STG MAINTAIN SKIN INTEGRITY WITH ASSISTANCE Description: STG Maintain Skin Integrity With min Assistance. Outcome: Progressing Flowsheets (Taken 07/02/2019 1541) STG: Maintain skin integrity with assistance: 3-Moderate assistance   Problem: RH KNOWLEDGE DEFICIT Goal: RH STG INCREASE KNOWLEDGE OF HYPERTENSION Description: Patient/caregiver will verbalize understanding of management of HTN including monitoring, diet, exercise, medications, and follow up care with min assist. Outcome: Progressing Goal: RH STG INCREASE KNOWLEGDE OF HYPERLIPIDEMIA Description: Patient/caregiver will verbalize understanding of management of HLD including monitoring, diet, exercise, medications, and follow up care with min assist. Outcome: Progressing Goal: RH STG INCREASE KNOWLEDGE OF STROKE PROPHYLAXIS Description: Patient/caregiver will verbalize understanding of management of stroke prophylaxis including monitoring, diet, exercise, medications, and follow up care with min assist. Outcome: Progressing

## 2019-07-02 NOTE — Progress Notes (Signed)
Physical Therapy Session Note  Patient Details  Name: Megan Martinez MRN: [id number] Date of Birth: February 18, 1951  Today's Date: 07/02/2019 PT Individual Time: [id number] PT Individual Time Calculation (min): 44 min    Short Term Goals: Week 2:  PT Short Term Goal 1 (Week 2): = to LTGs based on ELOS  Skilled Therapeutic Interventions/Progress Updates:    Pt seated in w/c upon PT arrival, agreeable to therapy tx and denies pain. Pt transported to the gym in w/c for time management and energy conservation. Orthotist Jinny Blossom) present from [facility]anger for orthotic consult. Pt ambulated x 100 ft with QC and min assist using R AFO and heel wedge and then ambulated x 100 ft with RW and CGA using R AFO and heel wedge, occasional hyperextension noted (inc with QC) but pt able to correct with cues. Since patient is able to correct/minimize hyperextension, knee cage not needed at this time, orthotist recommending PLS AFO with heel wedge. Pt ambulated x 50 ft without AD and min assist, however increased R knee hyperextension noted and therapist blocking hyperextension, cues for increased L step length and gait mechanics. Pt performed squat isometric hold this session without UE support x 30 sec, good quadriceps control noted. For hamstring strengthening and neuro re-ed pt seated on raised mat performed x 15 hamstring curls against manual resistance, isometric R hamstring activation against manual resistance x 10 with 5 sec hold, attempted standing R hamstring curl however unable to fully lift against gravity. Pt transferred to supine with supervision, in supine pt performed modified hamstring bridges with therapist holding feet up x 15. Pt transferred to prone on elbows with min assist and cues for R UE placement. While prone on elbows pt worked on R UE weightbearing for neuro re-ed, performed x 5 modified push up plus while prone on elbows working on shoulder stabilization with cues for techniques, while prone on elbows pt  performed L UE reaching for increased R UE weightbearing and stabilization. Pt transferred to supine and then sitting with supervision-min assist. Pt transferred to standing and then performed transfer onto mat into tall kneeling with UE support on chair. In tall kneeling pt performed sidestepping on knees working on hip strength, weightshifting and postural control, x 2 trials in each direction with min assist. In tall kneeling without UE support pt performed x 10 tall kneeling<>sitting back towards heels for hamstring activation and NMR. Pt transferred to sitting and performed stand pivot to w/c min assist, transported back to room and left in w/c with needs in reach and chair alarm set.    Therapy Documentation Precautions:  Precautions Precautions: Fall Precaution Comments: right hemiparesis Restrictions Weight Bearing Restrictions: No Other Position/Activity Restrictions: RLE ace wrapped into dorsiflexion.    Therapy/Group: Individual Therapy  Netta Corrigan, PT, DPT, CSRS 07/02/2019, 7:40 AM

## 2019-07-02 NOTE — Progress Notes (Signed)
Speech Language Pathology Daily Session Note  Patient Details  Name: Megan Martinez MRN: [id number] Date of Birth: 06/21/1950  Today's Date: 07/02/2019 SLP Individual Time: 0730-0759 SLP Individual Time Calculation (min): 29 min  Short Term Goals: Week 2: SLP Short Term Goal 1 (Week 2): STG=LTG due to remaining length of stay  Skilled Therapeutic Interventions: Pt was seen for skilled ST targeting speech goals. Pt with improved independence with self-monitoring use of slow rate clear speech strategy during conversational tasks. She used slow rate and overarticulation to achieve 90% intelligibility throughout abstract conversation with only Supervision A verbal cues required for repetition for clarification of messages. Also of note, today's conversational tasks were targeted in an environment with moderately distracting background noise in order to increase challenge for pt, given her excellent progress toward use of strategies. Pt left sitting in chair with seat alarm in place and needs met to her satisfaction. Continue per current plan of care.        Pain Pain Assessment Pain Scale: 0-10 Pain Score: 0-No pain  Therapy/Group: Individual Therapy  Arbutus Leas 07/02/2019, 11:28 AM

## 2019-07-02 NOTE — Progress Notes (Signed)
Pt checked qh for safety. Bed low with fall alarm on and call bell within reach.  

## 2019-07-02 NOTE — Progress Notes (Signed)
[facility] PHYSICAL MEDICINE & REHABILITATION PROGRESS NOTE  Subjective/Complaints: Patient seen sitting up in her chair this morning working with therapies.  She states she slept well overnight.  She states a good weekend.  She has questions regarding date of stenting.  ROS: Denies CP, shortness of breath, nausea, vomiting, diarrhea.  Objective: Vital Signs: Blood pressure (!) 156/61, pulse (!) 51, temperature 97.9 F (36.6 C), temperature source Oral, resp. rate 18, height 5\' 2"  (1.575 m), weight 72.8 kg, SpO2 94 %. No results found. Recent Labs    07/02/19 0522  WBC 5.4  HGB 12.3  HCT 37.5  PLT 268   Recent Labs    07/02/19 0522  NA 144  K 3.6  CL 104  CO2 26  GLUCOSE 128*  BUN 13  CREATININE 0.89  CALCIUM 9.1    Physical Exam: BP (!) 156/61 (BP Location: Left Arm)   Pulse (!) 51   Temp 97.9 F (36.6 C) (Oral)   Resp 18   Ht 5\' 2"  (1.575 m)   Wt 72.8 kg   SpO2 94%   BMI 29.34 kg/m  Constitutional: No distress . Vital signs reviewed. HENT: Normocephalic.  Atraumatic. Eyes: EOMI. No discharge. Cardiovascular: No JVD. Respiratory: Normal effort.  No stridor. GI: Non-distended. Skin: Warm and dry.  Intact. Psych: Normal mood.  Normal behavior. Musc: No edema in extremities.  No tenderness in extremities. Neurological: Alert Dysarthria, unchanged She makes eye contact with examiner and follows simple commands.   Appears to have good insight and awareness. Motor: RUE: Shoulder abduction 2/5, elbow flexion 1/5, elbow extension 0/5, distally 0/5 RLE: Hip flexion, knee extension 4/5, ankle dorsiflexion 0/5, unchanged No increase in tone  Assessment/Plan: 1. Functional deficits secondary to left pontine infarct which require 3+ hours per day of interdisciplinary therapy in a comprehensive inpatient rehab setting.  Physiatrist is providing close team supervision and 24 hour management of active medical problems listed below.  Physiatrist and rehab team  continue to assess barriers to discharge/monitor patient progress toward functional and medical goals  Care Tool:  Bathing    Body parts bathed by patient: Right arm, Chest, Abdomen, Front perineal area, Buttocks, Right upper leg, Left upper leg, Right lower leg, Left lower leg, Face, Left arm   Body parts bathed by helper: Left arm     Bathing assist Assist Level: Contact Guard/Touching assist     Upper Body Dressing/Undressing Upper body dressing   What is the patient wearing?: Pull over shirt, Bra    Upper body assist Assist Level: Minimal Assistance - Patient > 75%    Lower Body Dressing/Undressing Lower body dressing    Lower body dressing activity did not occur: Safety/medical concerns What is the patient wearing?: Pants     Lower body assist Assist for lower body dressing: Minimal Assistance - Patient > 75%     Toileting Toileting    Toileting assist Assist for toileting: Minimal Assistance - Patient > 75%     Transfers Chair/bed transfer  Transfers assist  Chair/bed transfer activity did not occur: Safety/medical concerns  Chair/bed transfer assist level: Contact Guard/Touching assist Chair/bed transfer assistive device: Cane, Orthosis   Locomotion Ambulation   Ambulation assist      Assist level: Contact Guard/Touching assist Assistive device: Cane-quad Max distance: 50   Walk 10 feet activity   Assist     Assist level: Contact Guard/Touching assist Assistive device: Cane-quad   Walk 50 feet activity   Assist Walk 50 feet with 2 turns  activity did not occur: Safety/medical concerns(R hemi, decreased strength/balance)  Assist level: Contact Guard/Touching assist Assistive device: Cane-quad    Walk 150 feet activity   Assist Walk 150 feet activity did not occur: Safety/medical concerns(R hemi, decreased strength/balance)  Assist level: Total Assistance - Patient < 25% Assistive device: Lite Gait    Walk 10 feet on uneven  surface  activity   Assist Walk 10 feet on uneven surfaces activity did not occur: Safety/medical concerns(R hemi, decreased strength/balance)         Wheelchair     Assist Will patient use wheelchair at discharge?: No Type of Wheelchair: Manual    Wheelchair assist level: Independent Max wheelchair distance: 150'    Wheelchair 50 feet with 2 turns activity    Assist    Wheelchair 50 feet with 2 turns activity did not occur: Safety/medical concerns(R hemi, required skilled interventions for safe technique)   Assist Level: Independent   Wheelchair 150 feet activity     Assist Wheelchair 150 feet activity did not occur: Safety/medical concerns(R hemi, required skilled interventions for safe technique)   Assist Level: Independent      Medical Problem List and Plan: 1.  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    VIR-appreciate recs, plan for stenting on 2/15 as outpatient   Continue CIR   WHO/PRAFO qhs 2.  Antithrombotics: -DVT/anticoagulation: Lovenox   CBC within normal limits on 2/8             -antiplatelet therapy: Aspirin 325 mg daily, Plavix 75 mg daily x3 months then Plavix alone 3. Pain Management: Tylenol as needed 4. Mood: Provide emotional support             -antipsychotic agents: N/A 5. Neuropsych: This patient is capable of making decisions on her own behalf. 6. Skin/Wound Care: Routine skin checks 7. Fluids/Electrolytes/Nutrition: Routine in and outs 8.  Essential hypertension: Patient on Norvasc 10 mg daily and cadestran-HCTZ 32-25 mg daily prior to admission.  Resume as needed            Labile on 2/8, monitor for trend  Monitor with increased mobility 9.  Hypothyroidism.  Continue Synthroid 10.  Hyperlipidemia: Continue Lipitor 11.  Post stroke dysphagia            Diet advanced to regular thins 12.  Transaminitis: Resolved  LFTs within  normal limits on 2/1 13.  Hypoalbuminemia, persistent on 2/8  Supplement initiated on 1/28 14.  Prediabetes  Elevated 2/8  Monitor with increased mobility via BMP 15.  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

## 2019-07-03 ENCOUNTER — Inpatient Hospital Stay (HOSPITAL_COMMUNITY): Payer: PPO | Admitting: Occupational Therapy

## 2019-07-03 ENCOUNTER — Inpatient Hospital Stay (HOSPITAL_COMMUNITY): Payer: PPO | Admitting: Physical Therapy

## 2019-07-03 ENCOUNTER — Inpatient Hospital Stay (HOSPITAL_COMMUNITY): Payer: PPO | Admitting: Speech Pathology

## 2019-07-03 DIAGNOSIS — I6300 Cerebral infarction due to thrombosis of unspecified precerebral artery: Secondary | ICD-10-CM

## 2019-07-03 DIAGNOSIS — I639 Cerebral infarction, unspecified: Secondary | ICD-10-CM

## 2019-07-03 MED ORDER — AMLODIPINE BESYLATE 2.5 MG PO TABS
2.5000 mg | ORAL_TABLET | Freq: Every day | ORAL | Status: DC
  Administered 2019-07-03 – 2019-07-04 (×2): 2.5 mg via ORAL
  Filled 2019-07-03 (×2): qty 1

## 2019-07-03 NOTE — Progress Notes (Signed)
Occupational Therapy Session Note  Patient Details  Name: Megan Martinez MRN: [id number] Date of Birth: 1951-04-30  Today's Date: 07/03/2019 OT Individual Time: 3664-4034 OT Individual Time Calculation (min): 60 min (unattended estim 930- 1030)   Short Term Goals: Week 2:  OT Short Term Goal 1 (Week 2): STGs = LTGs  Skilled Therapeutic Interventions/Progress Updates:    Pt received in wc ready for therapy. Reported she bathed with tech earlier.  Pt self propelled to gym and completed stand pivot to mat with close S.  Worked on scapular AROM of adduction, active shoulder rolls, weight bearing on R hand with active tricep extension, and pectoral stretches with R hand on mat and pt reaching L arm out to side following hand with eyes.    Active tricep facilation with a/arom of pushing heavy bar stool away and then focused on sh flex a/arom with pushing bar stool and reaching forward.  estim placed on forearm for wrist and finger extension with Saebo stim one for FES with facilitated grasp and release of cones.   estim moved to R deltoids to facilitate shoulder active contraction. Left on for 60 min of unattended estim for cyclic estim.  Saebo Stim One 330 pulse width 35 Hz pulse rate On 8 sec/ off 8 sec Ramp up/ down 2 sec Symmetrical Biphasic wave form  Max intensity 163m at 500 Ohm load  Pt tolerated estim well.  Practiced a w/c to tub bench transfer using a semi stand/squat pivot with close S in both directions. Pt did well with the transfer and does want [place] order one for her.  Reviewed how to prevent water spillage with curtain and how set up tub bench.    Pt self propelled back to room. Set up with chair alarm, lap tray and all needs met.   Therapy Documentation Precautions:  Precautions Precautions: Fall Precaution Comments: right hemiparesis Restrictions Weight Bearing Restrictions: No Other Position/Activity Restrictions: RLE ace wrapped into  dorsiflexion.  Pain: Pain Assessment Pain Scale: 0-10 Pain Score: 0-No pain ADL: ADL Eating: Set up Grooming: Contact guard Where Assessed-Grooming: Standing at sink Upper Body Bathing: Setup Where Assessed-Upper Body Bathing: Shower Lower Body Bathing: Contact guard Where Assessed-Lower Body Bathing: Shower Upper Body Dressing: Minimal assistance Where Assessed-Upper Body Dressing: Wheelchair Lower Body Dressing: Minimal assistance Where Assessed-Lower Body Dressing: Wheelchair Toileting: Minimal assistance Where Assessed-Toileting: TGlass blower/designer MPsychiatric nurseMethod: SActuaryMethod: SLibrarian, academic Walk in sFacilities managerTransfer: MEnvironmental education officerMethod: Stand pivot   Therapy/Group: Individual Therapy  SEskridge2/01/2020, 12:16 PM

## 2019-07-03 NOTE — Discharge Summary (Signed)
Physician Discharge Summary  Patient ID: Megan Martinez MRN: [id number] DOB/AGE: [birth year] 69 y.o.  Admit date: 06/20/2019 Discharge date: 07/05/2019  Discharge Diagnoses:  Principal Problem:   Left pontine cerebrovascular accident [facility]) Active Problems:   Transaminitis   Hypoalbuminemia due to protein-calorie malnutrition (HCC)   Labile blood pressure   Stage 2 chronic kidney disease   Prediabetes   Benign essential HTN   Hypokalemia   Stenosis of artery (HCC)   Right wrist drop   Right foot drop   Discharged Condition: Stable  Significant Diagnostic Studies: CT Code Stroke CTA Head W/WO contrast  Result Date: 06/14/2019 CLINICAL DATA:  Ataxia.  Facial droop. EXAM: CT ANGIOGRAPHY HEAD AND NECK CT PERFUSION BRAIN TECHNIQUE: Multidetector CT imaging of the head and neck was performed using the standard protocol during bolus administration of intravenous contrast. Multiplanar CT image reconstructions and MIPs were obtained to evaluate the vascular anatomy. Carotid stenosis measurements (when applicable) are obtained utilizing NASCET criteria, using the distal internal carotid diameter as the denominator. Multiphase CT imaging of the brain was performed following IV bolus contrast injection. Subsequent parametric perfusion maps were calculated using RAPID software. CONTRAST:  12mL OMNIPAQUE IOHEXOL 350 MG/ML SOLN COMPARISON:  CT head without contrast of the same day. FINDINGS: CTA NECK FINDINGS Aortic arch: There is common origin of the left common carotid artery and the innominate artery. Aortic arch and great vessel origins are within normal limits otherwise. No atherosclerotic change, stenosis, or aneurysm is present. Right carotid system: The right common carotid artery is within normal limits. Bifurcation is unremarkable. Cervical right ICA is normal. Left carotid system: The left common carotid artery is within normal limits. Mild atherosclerotic changes are noted in the proximal left  ICA without stenosis significant stenosis relative to the more distal vessel. Vertebral arteries: The left vertebral artery is the dominant vessel. Both vertebral arteries originate from the subclavian arteries without significant stenosis. There is no significant stenosis of either vertebral artery in the neck. Skeleton: Degenerative anterolisthesis present C3-4 and C4-5. Endplate changes contribute to mild foraminal narrowing bilaterally at C5-6. No focal lytic or blastic lesions are present. Other neck: Neck soft tissues are otherwise within normal limits. Upper chest: Lung apices are clear. Thoracic inlet is within normal limits. Review of the MIP images confirms the above findings CTA HEAD FINDINGS Anterior circulation: Atherosclerotic changes are present within the cavernous internal carotid arteries bilaterally without significant stenosis through the ICA termini. Left posterior communicating artery is noted. The A1 and M1 segments are normal. Left A1 segment is dominant. The anterior communicating artery is patent. MCA bifurcations are within normal limits bilaterally. The ACA and MCA branch vessels are within normal limits. Posterior circulation: Atherosclerotic calcifications are present at the dural margin of the left vertebral artery. 50% stenosis is present. Is some narrowing of distal vertebral arteries scratched at there is some narrowing of distal V4 segments bilaterally. The proximal basilar artery is small. A high-grade mid basilar stenosis is present at the level of the pons. There is flow in the distal basilar artery. A left posterior communicating artery contributes. Moderate proximal PCA stenoses are present bilaterally. There is attenuation of distal PCA branch vessels, right greater than left. Venous sinuses: The dural sinuses are patent. The straight sinus deep cerebral veins are intact. Cortical veins are unremarkable. No vascular malformations are present. Anatomic variants: Prominent left  posterior communicating artery. Review of the MIP images confirms the above findings CT Brain Perfusion Findings: ASPECTS: 10/10 CBF (<30%) Volume:  43mL Perfusion (Tmax>6.0s) volume: 31mL. There is some indication of ischemia in the cerebellum when a shorter T-max is used. Mismatch Volume: 35mL Infarction Location:Posterior fossa ischemia IMPRESSION: 1. 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 2. Moderate proximal PCA stenoses bilaterally. 3. 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 4. CT perfusion demonstrates no anterior circulation infarct or ischemia. 5. Question ischemia within the cerebellum. 6. Atherosclerotic changes at the left proximal ICA and bilateral cavernous internal carotid arteries without significant stenosis in the anterior circulation. These results were called by telephone at the time of interpretation on 06/14/2019 at 7:58 am to provider MCNEILL [facility] , who verbally acknowledged these results. Electronically Signed   By: San Morelle M.D.   On: 06/14/2019 08:13   CT HEAD WO CONTRAST  Result Date: 06/15/2019 CLINICAL DATA:  Follow-up stroke.  24 hours post tPA. EXAM: CT HEAD WITHOUT CONTRAST TECHNIQUE: Contiguous axial images were obtained from the base of the skull through the vertex without intravenous contrast. COMPARISON:  MRI and CT studies done yesterday. FINDINGS: Brain: Extensive low-density within the left side of the pons. No hemorrhagic transformation. No focal cerebellar finding. Cerebral hemispheres are normal. No hydrocephalus or extra-axial collection. Vascular: There is atherosclerotic calcification of the major vessels at the base of the brain. Skull: Negative Sinuses/Orbits: Clear/normal Other: None IMPRESSION: Low-density throughout the left side of the pons consistent with the acute infarction. No hemorrhagic transformation. No hydrocephalus. Electronically Signed   By: [name] Chimes  M.D.   On: 06/15/2019 08:08   CT Code Stroke CTA Neck W/WO contrast  Result Date: 06/14/2019 CLINICAL DATA:  Ataxia.  Facial droop. EXAM: CT ANGIOGRAPHY HEAD AND NECK CT PERFUSION BRAIN TECHNIQUE: Multidetector CT imaging of the head and neck was performed using the standard protocol during bolus administration of intravenous contrast. Multiplanar CT image reconstructions and MIPs were obtained to evaluate the vascular anatomy. Carotid stenosis measurements (when applicable) are obtained utilizing NASCET criteria, using the distal internal carotid diameter as the denominator. Multiphase CT imaging of the brain was performed following IV bolus contrast injection. Subsequent parametric perfusion maps were calculated using RAPID software. CONTRAST:  112mL OMNIPAQUE IOHEXOL 350 MG/ML SOLN COMPARISON:  CT head without contrast of the same day. FINDINGS: CTA NECK FINDINGS Aortic arch: There is common origin of the left common carotid artery and the innominate artery. Aortic arch and great vessel origins are within normal limits otherwise. No atherosclerotic change, stenosis, or aneurysm is present. Right carotid system: The right common carotid artery is within normal limits. Bifurcation is unremarkable. Cervical right ICA is normal. Left carotid system: The left common carotid artery is within normal limits. Mild atherosclerotic changes are noted in the proximal left ICA without stenosis significant stenosis relative to the more distal vessel. Vertebral arteries: The left vertebral artery is the dominant vessel. Both vertebral arteries originate from the subclavian arteries without significant stenosis. There is no significant stenosis of either vertebral artery in the neck. Skeleton: Degenerative anterolisthesis present C3-4 and C4-5. Endplate changes contribute to mild foraminal narrowing bilaterally at C5-6. No focal lytic or blastic lesions are present. Other neck: Neck soft tissues are otherwise within normal  limits. Upper chest: Lung apices are clear. Thoracic inlet is within normal limits. Review of the MIP images confirms the above findings CTA HEAD FINDINGS Anterior circulation: Atherosclerotic changes are present within the cavernous internal carotid arteries bilaterally without significant stenosis through the ICA termini. Left posterior  communicating artery is noted. The A1 and M1 segments are normal. Left A1 segment is dominant. The anterior communicating artery is patent. MCA bifurcations are within normal limits bilaterally. The ACA and MCA branch vessels are within normal limits. Posterior circulation: Atherosclerotic calcifications are present at the dural margin of the left vertebral artery. 50% stenosis is present. Is some narrowing of distal vertebral arteries scratched at there is some narrowing of distal V4 segments bilaterally. The proximal basilar artery is small. A high-grade mid basilar stenosis is present at the level of the pons. There is flow in the distal basilar artery. A left posterior communicating artery contributes. Moderate proximal PCA stenoses are present bilaterally. There is attenuation of distal PCA branch vessels, right greater than left. Venous sinuses: The dural sinuses are patent. The straight sinus deep cerebral veins are intact. Cortical veins are unremarkable. No vascular malformations are present. Anatomic variants: Prominent left posterior communicating artery. Review of the MIP images confirms the above findings CT Brain Perfusion Findings: ASPECTS: 10/10 CBF (<30%) Volume: 27mL Perfusion (Tmax>6.0s) volume: 53mL. There is some indication of ischemia in the cerebellum when a shorter T-max is used. Mismatch Volume: 47mL Infarction Location:Posterior fossa ischemia IMPRESSION: 1. 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 2. Moderate proximal PCA stenoses bilaterally. 3. 50% stenosis of the left vertebral artery at the dural margin and mild narrowing of distal V4 segments  bilaterally. 4. CT perfusion demonstrates no anterior circulation infarct or ischemia. 5. Question ischemia within the cerebellum. 6. Atherosclerotic changes at the left proximal ICA and bilateral cavernous internal carotid arteries without significant stenosis in the anterior circulation. These results were called by telephone at the time of interpretation on 06/14/2019 at 7:58 am to provider MCNEILL [facility] , who verbally acknowledged these results. Electronically Signed   By: San Morelle M.D.   On: 06/14/2019 08:13   MR BRAIN WO CONTRAST  Result Date: 06/14/2019 CLINICAL DATA:  Wake up stroke. Ataxia and facial droop. EXAM: MRI HEAD WITHOUT CONTRAST TECHNIQUE: Multiplanar, multiecho pulse sequences of the brain and surrounding structures were obtained without intravenous contrast. COMPARISON:  CT head, CTA, and CT perfusion 06/14/2019 FINDINGS: Brain: Diffusion-weighted images demonstrate restricted diffusion in the left paramedian pons. No significant T2 or FLAIR signal is associated. There may be some diffusion abnormality in the midbrain. No acute hemorrhage is present. Minimal white matter disease is present otherwise, likely within normal limits for age. The ventricles are of normal size. No significant extraaxial fluid collection is present. Vascular: Abnormal flow signal is present in mid basilar artery consistent with known high-grade stenosis. Skull and upper cervical spine: The craniocervical junction is normal. Upper cervical spine is within normal limits. Marrow signal is unremarkable. Sinuses/Orbits: The paranasal sinuses and mastoid air cells are clear. Bilateral lens replacements are noted. Globes and orbits are otherwise unremarkable. IMPRESSION: 1. 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 2. 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 3. No other acute intracranial abnormality. These results were called by telephone  at the time of interpretation on 06/14/2019 at 8:07am to provider MCNEILL [facility] , who verbally acknowledged these results. Electronically Signed   By: San Morelle M.D.   On: 06/14/2019 08:17   CT Code Stroke Cerebral Perfusion with contrast  Result Date: 06/14/2019 CLINICAL DATA:  Ataxia.  Facial droop. EXAM: CT ANGIOGRAPHY HEAD AND NECK CT PERFUSION BRAIN TECHNIQUE: Multidetector CT imaging of the head and neck was performed using the standard protocol during bolus administration of  intravenous contrast. Multiplanar CT image reconstructions and MIPs were obtained to evaluate the vascular anatomy. Carotid stenosis measurements (when applicable) are obtained utilizing NASCET criteria, using the distal internal carotid diameter as the denominator. Multiphase CT imaging of the brain was performed following IV bolus contrast injection. Subsequent parametric perfusion maps were calculated using RAPID software. CONTRAST:  141mL OMNIPAQUE IOHEXOL 350 MG/ML SOLN COMPARISON:  CT head without contrast of the same day. FINDINGS: CTA NECK FINDINGS Aortic arch: There is common origin of the left common carotid artery and the innominate artery. Aortic arch and great vessel origins are within normal limits otherwise. No atherosclerotic change, stenosis, or aneurysm is present. Right carotid system: The right common carotid artery is within normal limits. Bifurcation is unremarkable. Cervical right ICA is normal. Left carotid system: The left common carotid artery is within normal limits. Mild atherosclerotic changes are noted in the proximal left ICA without stenosis significant stenosis relative to the more distal vessel. Vertebral arteries: The left vertebral artery is the dominant vessel. Both vertebral arteries originate from the subclavian arteries without significant stenosis. There is no significant stenosis of either vertebral artery in the neck. Skeleton: Degenerative anterolisthesis present C3-4 and  C4-5. Endplate changes contribute to mild foraminal narrowing bilaterally at C5-6. No focal lytic or blastic lesions are present. Other neck: Neck soft tissues are otherwise within normal limits. Upper chest: Lung apices are clear. Thoracic inlet is within normal limits. Review of the MIP images confirms the above findings CTA HEAD FINDINGS Anterior circulation: Atherosclerotic changes are present within the cavernous internal carotid arteries bilaterally without significant stenosis through the ICA termini. Left posterior communicating artery is noted. The A1 and M1 segments are normal. Left A1 segment is dominant. The anterior communicating artery is patent. MCA bifurcations are within normal limits bilaterally. The ACA and MCA branch vessels are within normal limits. Posterior circulation: Atherosclerotic calcifications are present at the dural margin of the left vertebral artery. 50% stenosis is present. Is some narrowing of distal vertebral arteries scratched at there is some narrowing of distal V4 segments bilaterally. The proximal basilar artery is small. A high-grade mid basilar stenosis is present at the level of the pons. There is flow in the distal basilar artery. A left posterior communicating artery contributes. Moderate proximal PCA stenoses are present bilaterally. There is attenuation of distal PCA branch vessels, right greater than left. Venous sinuses: The dural sinuses are patent. The straight sinus deep cerebral veins are intact. Cortical veins are unremarkable. No vascular malformations are present. Anatomic variants: Prominent left posterior communicating artery. Review of the MIP images confirms the above findings CT Brain Perfusion Findings: ASPECTS: 10/10 CBF (<30%) Volume: 27mL Perfusion (Tmax>6.0s) volume: 67mL. There is some indication of ischemia in the cerebellum when a shorter T-max is used. Mismatch Volume: 54mL Infarction Location:Posterior fossa ischemia IMPRESSION: 1. High-grade  stenosis of the mid basilar artery. 2. Moderate proximal PCA stenoses bilaterally. 3. 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 4. CT perfusion demonstrates no anterior circulation infarct or ischemia. 5. Question ischemia within the cerebellum. 6. Atherosclerotic changes at the left proximal ICA and bilateral cavernous internal carotid arteries without significant stenosis in the anterior circulation. These results were called by telephone at the time of interpretation on 06/14/2019 at 7:58 am to provider MCNEILL [facility] , who verbally acknowledged these results. Electronically Signed   By: San Morelle M.D.   On: 06/14/2019 08:13   DG  Swallowing Func-Speech Pathology  Result Date: 06/27/2019 Objective Swallowing Evaluation: Type of Study: MBS-Modified Barium Swallow Study  Patient Details Name: Megan Martinez MRN: [id number] Date of Birth: August 11, 1950 Today's Date: 06/27/2019 Time: SLP Start Time : 0902 -SLP Stop Time : 0915 SLP Time Calculation (min): 13 min Past Medical History: Past Medical History: Diagnosis Date . Allergy  . Arthritis  . Hypertension  . Thyroid disease  Past Surgical History: Past Surgical History: Procedure Laterality Date . ABDOMINAL HYSTERECTOMY   . APPENDECTOMY   . CATARACT EXTRACTION   . CHOLECYSTECTOMY   . TUBAL LIGATION   HPI: 69 y.o. female with a history of hypertension admitted with right sided hemiparesis and facial asymmetry; received IV TPA.  Dx acute left paramedian pontine CVA. Pt [facility]/19.  Subjective: alert, cooporative Assessment / Plan / Recommendation CHL IP CLINICAL IMPRESSIONS 06/27/2019 Clinical Impression Pt presents with improved ability to protect her airway with thin liquids without need for use of any compensatory strategies since last MBSS 06/14/19. Pt has a hx of silent aspiration, and previous instrumental study recommended chin tuck maneuver to prevent airway  intrusion with thins. However, today no penetration or aspiration was observed throughout several trials of thin barium via cup and straw, or when consuming thin with barium pill. Pt's oral phase was only remarkable for reduced posterior propulsion of large chalky barium pill, and reduced cohesion of dual consistency thin with pill bolus. Given improvements in oropharyngeal swallow function, recommend pt continue current regular/thin diet and there is no longer a need for pt to perform chin tuck with liquids. Pt may consume small pills whole with thins, however large pills should either be broken to take with thin and/or consumed whole in applesauce (based on pt preference). SLP Visit Diagnosis Dysphagia, unspecified (R13.10) Attention and concentration deficit following -- Frontal lobe and executive function deficit following -- Impact on safety and function Mild aspiration risk   CHL IP TREATMENT RECOMMENDATION 06/15/2019 Treatment Recommendations Therapy as outlined in treatment plan below   Prognosis 06/27/2019 Prognosis for Safe Diet Advancement Good Barriers to Reach Goals -- Barriers/Prognosis Comment -- CHL IP DIET RECOMMENDATION 06/27/2019 SLP Diet Recommendations Regular solids;Thin liquid Liquid Administration via Cup;Straw Medication Administration Whole meds with liquid Compensations Minimize environmental distractions;Slow rate;Small sips/bites Postural Changes Remain semi-upright after after feeds/meals (Comment)   CHL IP OTHER RECOMMENDATIONS 06/27/2019 Recommended Consults -- Oral Care Recommendations Oral care BID Other Recommendations --   CHL IP FOLLOW UP RECOMMENDATIONS 06/18/2019 Follow up Recommendations Inpatient Rehab   CHL IP FREQUENCY AND DURATION 06/15/2019 Speech Therapy Frequency (ACUTE ONLY) min 2x/week Treatment Duration 2 weeks      CHL IP ORAL PHASE 06/27/2019 Oral Phase Impaired Oral - Pudding Teaspoon -- Oral - Pudding Cup -- Oral - Honey Teaspoon -- Oral - Honey Cup -- Oral - Nectar  Teaspoon -- Oral - Nectar Cup -- Oral - Nectar Straw -- Oral - Thin Teaspoon -- Oral - Thin Cup WFL Oral - Thin Straw WFL Oral - Puree NT Oral - Mech Soft NT Oral - Regular -- Oral - Multi-Consistency -- Oral - Pill Lingual pumping;Reduced posterior propulsion Oral Phase - Comment --  CHL IP PHARYNGEAL PHASE 06/27/2019 Pharyngeal Phase WFL Pharyngeal- Pudding Teaspoon -- Pharyngeal -- Pharyngeal- Pudding Cup -- Pharyngeal -- Pharyngeal- Honey Teaspoon -- Pharyngeal -- Pharyngeal- Honey Cup -- Pharyngeal -- Pharyngeal- Nectar Teaspoon -- Pharyngeal -- Pharyngeal- Nectar Cup -- Pharyngeal -- Pharyngeal- Nectar Straw -- Pharyngeal -- Pharyngeal- Thin Teaspoon -- Pharyngeal --  Pharyngeal- Thin Cup [facility] Pharyngeal -- Pharyngeal- Thin Straw WFL Pharyngeal -- Pharyngeal- Puree NT Pharyngeal -- Pharyngeal- Mechanical Soft -- Pharyngeal -- Pharyngeal- Regular -- Pharyngeal -- Pharyngeal- Multi-consistency -- Pharyngeal -- Pharyngeal- Pill WFL Pharyngeal -- Pharyngeal Comment --  CHL IP CERVICAL ESOPHAGEAL PHASE 06/27/2019 Cervical Esophageal Phase WFL Pudding Teaspoon -- Pudding Cup -- Honey Teaspoon -- Honey Cup -- Nectar Teaspoon -- Nectar Cup -- Nectar Straw -- Thin Teaspoon -- Thin Cup -- Thin Straw -- Puree -- Mechanical Soft -- Regular -- Multi-consistency -- Pill -- Cervical Esophageal Comment -- Megan Martinez 06/27/2019, 9:38 AM              DG Swallowing Func-Speech Pathology  Result Date: 06/15/2019 Objective Swallowing Evaluation: Type of Study: MBS-Modified Barium Swallow Study  Patient Details Name: Megan Martinez MRN: [id number] Date of Birth: [birth year] Today's Date: 06/15/2019 Time: SLP Start Time (ACUTE ONLY): 0950 -SLP Stop Time (ACUTE ONLY): 1010 SLP Time Calculation (min) (ACUTE ONLY): 20 min Past Medical History: Past Medical History: Diagnosis Date . Allergy  . Arthritis  . Hypertension  . Thyroid disease  Past Surgical History: Past Surgical History: Procedure Laterality Date . ABDOMINAL HYSTERECTOMY   .  APPENDECTOMY   . CATARACT EXTRACTION   . CHOLECYSTECTOMY   . TUBAL LIGATION   HPI:  69 y.o. female with a history of hypertension admitted with right sided hemiparesis and facial asymmetry; received IV TPA.  Dx acute left paramedian pontine CVA.  Subjective: alert, cooporative Assessment / Plan / Recommendation CHL IP CLINICAL IMPRESSIONS 06/15/2019 Clinical Impression Patient presents with mild oropharyngeal dysphagia characterized by silent aspiration with thin liquids. Oral phase remarkable for reduced lingual strength resulting in lingual residue. Moments of clearing residue resulted in episodes of impaired timing. This along with reduced sensation resulted in silent aspiration of thin liquids. As pt utilized chin tuck with straw for thin liquids, no penetration/aspiration observed. With pt's motivation and utilization of compensatory strategies, good progress is suspected. Dysphagia 3 diet and thin liquids utilizing chin tuck recommended, with intermittent staff supervision. SLP Visit Diagnosis Dysarthria and anarthria (R47.1);Dysphagia, oropharyngeal phase (R13.12) Attention and concentration deficit following -- Frontal lobe and executive function deficit following -- Impact on safety and function Mild aspiration risk;Moderate aspiration risk   CHL IP TREATMENT RECOMMENDATION 06/15/2019 Treatment Recommendations Therapy as outlined in treatment plan below   Prognosis 06/15/2019 Prognosis for Safe Diet Advancement Good Barriers to Reach Goals -- Barriers/Prognosis Comment -- CHL IP DIET RECOMMENDATION 06/15/2019 SLP Diet Recommendations Thin liquid;Dysphagia 3 (Mech soft) solids Liquid Administration via Straw Medication Administration Whole meds with puree Compensations Chin tuck;Use straw to facilitate chin tuck Postural Changes Seated upright at 90 degrees   CHL IP OTHER RECOMMENDATIONS 06/15/2019 Recommended Consults -- Oral Care Recommendations Oral care BID Other Recommendations Order thickener from pharmacy    CHL IP FOLLOW UP RECOMMENDATIONS 06/15/2019 Follow up Recommendations Inpatient Rehab   CHL IP FREQUENCY AND DURATION 06/15/2019 Speech Therapy Frequency (ACUTE ONLY) min 2x/week Treatment Duration 2 weeks      CHL IP ORAL PHASE 06/15/2019 Oral Phase Impaired Oral - Pudding Teaspoon -- Oral - Pudding Cup -- Oral - Honey Teaspoon -- Oral - Honey Cup -- Oral - Nectar Teaspoon -- Oral - Nectar Cup -- Oral - Nectar Straw -- Oral - Thin Teaspoon -- Oral - Thin Cup -- Oral - Thin Straw Lingual/palatal residue Oral - Puree Lingual/palatal residue;Premature spillage Oral - Mech Soft Lingual/palatal residue Oral - Regular -- Oral - Multi-Consistency --  Oral - Pill -- Oral Phase - Comment --  CHL IP PHARYNGEAL PHASE 06/15/2019 Pharyngeal Phase Impaired Pharyngeal- Pudding Teaspoon -- Pharyngeal -- Pharyngeal- Pudding Cup -- Pharyngeal -- Pharyngeal- Honey Teaspoon -- Pharyngeal -- Pharyngeal- Honey Cup -- Pharyngeal -- Pharyngeal- Nectar Teaspoon -- Pharyngeal -- Pharyngeal- Nectar Cup -- Pharyngeal -- Pharyngeal- Nectar Straw -- Pharyngeal -- Pharyngeal- Thin Teaspoon -- Pharyngeal -- Pharyngeal- Thin Cup -- Pharyngeal -- Pharyngeal- Thin Straw Penetration/Aspiration during swallow;Trace aspiration;Pharyngeal residue - pyriform;Pharyngeal residue - valleculae;Reduced airway/laryngeal closure;Compensatory strategies attempted (with notebox) Pharyngeal Material enters airway, remains ABOVE vocal cords then ejected out Pharyngeal- Puree Reduced epiglottic inversion;Trace aspiration;Penetration/Aspiration during swallow;Reduced airway/laryngeal closure Pharyngeal Material enters airway, remains ABOVE vocal cords then ejected out Pharyngeal- Mechanical Soft -- Pharyngeal -- Pharyngeal- Regular -- Pharyngeal -- Pharyngeal- Multi-consistency -- Pharyngeal -- Pharyngeal- Pill -- Pharyngeal -- Pharyngeal Comment --  CHL IP CERVICAL ESOPHAGEAL PHASE 06/15/2019 Cervical Esophageal Phase WFL Pudding Teaspoon -- Pudding Cup -- Honey Teaspoon  -- Honey Cup -- Nectar Teaspoon -- Nectar Cup -- Nectar Straw -- Thin Teaspoon -- Thin Cup -- Thin Straw -- Puree -- Mechanical Soft -- Regular -- Multi-consistency -- Pill -- Cervical Esophageal Comment -- Note populated for Lenore Manner, Student SLP Osie Bond., M.A. [facility] Pager [phone] Office [phone] 06/15/2019, 2:39 PM              ECHOCARDIOGRAM COMPLETE  Result Date: 06/14/2019   ECHOCARDIOGRAM REPORT   Patient Name:   Megan Martinez Date of Exam: 06/14/2019 Medical Rec #:  [id number]        Height:       64.0 in Accession #:    [id number]       Weight:       169.0 lb Date of Birth:  Oct 18, 1950         BSA:          1.82 m Patient Age:    27 years         BP:           114/101 mmHg Patient Gender: F                HR:           77 bpm. Exam Location:  Inpatient Procedure: 2D Echo Indications:    stroke 434.91  History:        Patient has no prior history of Echocardiogram examinations.                 Risk Factors:Hypertension and Former Smoker.  Sonographer:    Jannett Celestine RDCS (AE) Referring Phys: [phone] MCNEILL P KIRKPATRICK  Sonographer Comments: restricted mobility IMPRESSIONS  1. Left ventricular ejection fraction, by visual estimation, is 60 to 65%. The left ventricle has normal function. There is no left ventricular hypertrophy.  2. Left ventricular diastolic parameters are consistent with Grade I diastolic dysfunction (impaired relaxation).  3. The left ventricle has no regional wall motion abnormalities.  4. Global right ventricle has normal systolic function.The right ventricular size is normal. No increase in right ventricular wall thickness.  5. Left atrial size was normal.  6. Right atrial size was normal.  7. Mild mitral annular calcification.  8. The mitral valve is normal in structure. No evidence of mitral valve regurgitation. No evidence of mitral stenosis.  9. The tricuspid valve is normal in structure. Tricuspid valve regurgitation is trivial. 10.  The aortic valve is tricuspid. Aortic valve regurgitation is not visualized. No evidence  of aortic valve sclerosis or stenosis. 11. The inferior vena cava is normal in size with greater than 50% respiratory variability, suggesting right atrial pressure of 3 mmHg. 12. TR signal is inadequate for assessing pulmonary artery systolic pressure. FINDINGS  Left Ventricle: Left ventricular ejection fraction, by visual estimation, is 60 to 65%. The left ventricle has normal function. The left ventricle has no regional wall motion abnormalities. The left ventricular internal cavity size was the left ventricle is normal in size. There is no left ventricular hypertrophy. Left ventricular diastolic parameters are consistent with Grade I diastolic dysfunction (impaired relaxation). Right Ventricle: The right ventricular size is normal. No increase in right ventricular wall thickness. Global RV systolic function is has normal systolic function. Left Atrium: Left atrial size was normal in size. Right Atrium: Right atrial size was normal in size Pericardium: There is no evidence of pericardial effusion. Mitral Valve: The mitral valve is normal in structure. Mild mitral annular calcification. No evidence of mitral valve regurgitation. No evidence of mitral valve stenosis by observation. Tricuspid Valve: The tricuspid valve is normal in structure. Tricuspid valve regurgitation is trivial. Aortic Valve: The aortic valve is tricuspid. Aortic valve regurgitation is not visualized. The aortic valve is structurally normal, with no evidence of sclerosis or stenosis. Pulmonic Valve: The pulmonic valve was normal in structure. Pulmonic valve regurgitation is not visualized. Pulmonic regurgitation is not visualized. Aorta: The aortic root is normal in size and structure. Venous: The inferior vena cava is normal in size with greater than 50% respiratory variability, suggesting right atrial pressure of 3 mmHg. IAS/Shunts: No atrial level shunt  detected by color flow Doppler.  LEFT VENTRICLE PLAX 2D LVIDd:         3.30 cm  Diastology LVIDs:         1.70 cm  LV e' lateral:   15.20 cm/s LV PW:         1.10 cm  LV E/e' lateral: 4.1 LV IVS:        1.00 cm  LV e' medial:    8.92 cm/s LVOT diam:     1.90 cm  LV E/e' medial:  6.9 LV SV:         36 ml LV SV Index:   18.98 LVOT Area:     2.84 cm  RIGHT VENTRICLE TAPSE (M-mode): 1.9 cm LEFT ATRIUM             Index LA diam:        3.20 cm 1.76 cm/m LA Vol (A2C):   43.7 ml 23.99 ml/m LA Vol (A4C):   41.2 ml 22.62 ml/m LA Biplane Vol: 42.4 ml 23.28 ml/m  AORTIC VALVE LVOT Vmax:   90.10 cm/s LVOT Vmean:  63.600 cm/s LVOT VTI:    0.261 m  AORTA Ao Root diam: 2.90 cm MITRAL VALVE MV Area (PHT): 2.16 cm             SHUNTS MV PHT:        101.79 msec          Systemic VTI:  0.26 m MV Decel Time: 351 msec             Systemic Diam: 1.90 cm MV E velocity: 61.70 cm/s 103 cm/s MV A velocity: 43.30 cm/s 70.3 cm/s MV E/A ratio:  1.42       1.5  Loralie Champagne MD Electronically signed by Loralie Champagne MD Signature Date/Time: 06/14/2019/5:12:56 PM    Final    CT HEAD CODE  STROKE WO CONTRAST  Result Date: 06/14/2019 CLINICAL DATA:  Code stroke. Ataxia. Facial droop and slurred speech. Last seen normal at 9:30 p.m. last night. EXAM: CT HEAD WITHOUT CONTRAST TECHNIQUE: Contiguous axial images were obtained from the base of the skull through the vertex without intravenous contrast. COMPARISON:  None. FINDINGS: Brain: No acute infarct, hemorrhage, or mass lesion is present. The ventricles are of normal size. No significant white matter lesions are present. No significant extraaxial fluid collection is present. Vascular: Atherosclerotic calcifications are present within the cavernous internal carotid arteries bilaterally. There is no hyperdense vessel. Skull: Calvarium is intact. No focal lytic or blastic lesions are present. No significant extracranial soft tissue lesion is present. Sinuses/Orbits: The paranasal sinuses and  mastoid air cells are clear. Bilateral lens replacements are noted. Globes and orbits are otherwise unremarkable. ASPECTS [facility] Stroke Program Early CT Score) - Ganglionic level infarction (caudate, lentiform nuclei, internal capsule, insula, M1-M3 cortex): 7/7 - Supraganglionic infarction (M4-M6 cortex): 3/3 Total score (0-10 with 10 being normal): 10/10 IMPRESSION: 1. Negative CT of the head. 2. ASPECTS is 10/10 The above was relayed via text pager to Dr. Leonel Ramsay on 06/14/2019 at 07:32 . Electronically Signed   By: San Morelle M.D.   On: 06/14/2019 07:32    Labs:  Basic Metabolic Panel: Recent Labs  Lab 07/02/19 0522  NA 144  K 3.6  CL 104  CO2 26  GLUCOSE 128*  BUN 13  CREATININE 0.89  CALCIUM 9.1    CBC: Recent Labs  Lab 06/28/19 0601 07/02/19 0522  WBC 6.3 5.4  NEUTROABS 4.0 3.1  HGB 12.9 12.3  HCT 39.2 37.5  MCV 89.7 88.0  PLT 236 268    CBG: No results for input(s): GLUCAP in the last 168 hours.  Family history.  Mother and father with history of hypertension as well as hyperlipidemia.  Denies any diabetes mellitus or colon cancer  Brief HPI:   Megan Martinez is a 69 y.o. right-handed female with history of hypertension.  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  Presented 06/14/2019 with right-sided weakness and dysarthria.  Cranial CT scan unremarkable for acute intracranial process.  Patient did receive TPA.  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  Moderate proximal PCA stenosis bilaterally.  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  Echocardiogram with ejection fraction of 65% without emboli.  Admission chemistries potassium 3.0, urine drug screen negative.  Neurology follow-up maintained on aspirin and  Plavix for CVA prophylaxis x3 months then Plavix alone.  Subcutaneous Lovenox for DVT prophylaxis.  Initially on a Cardene drip for blood pressure control.  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  Patient was admitted for a comprehensive rehab program   Hospital Course: MARUA ZIGAN was admitted to rehab 06/20/2019 for inpatient therapies to consist of PT, ST and OT at least three hours five days a week. Past admission physiatrist, therapy team and rehab RN have worked together to provide customized collaborative inpatient rehab.  Pertaining to patient's left pontine infarction status post TPA.  Patient would follow-up outpatient neurology services as well as interventional radiology for basilar artery stenting.  She would remain on aspirin and Plavix x3 months then Plavix  alone.  Blood pressure controlled and monitored on Norvasc and would follow-up primary MD.  Synthroid ongoing for hypothyroidism.  Patient maintained on Lipitor for hyperlipidemia.  Subcutaneous Lovenox for DVT prophylaxis no bleeding episodes.  Diet was slowly advanced to regular consistency.   Blood pressures were monitored on TID basis and controlled  Megan Martinez is continent of bowel and bladder.  Megan Martinez has made gains during rehab stay and is attending therapies  Megan Martinez will continue to receive follow up therapies   after discharge  Rehab course: During patient's stay in rehab weekly team conferences were held to monitor patient's progress, set goals and discuss barriers to discharge. At admission, patient required mod to max assist 60 feet rolling walker, moderate assist sit to stand, minimal assist sit to supine.  Moderate assist upper body bathing mod assist lower body bathing minimal assist upper body dressing moderate assist lower body dressing  Physical exam.  Blood pressure 135/55 pulse 61 temperature 92 respirations 18 oxygen  saturation 97% room air Constitutional.  Well-developed well-nourished HEENT Head.  Normocephalic and atraumatic Eyes.  Pupils round and reactive to light no discharge without nystagmus Neck.  Supple nontender no JVD without thyromegaly Cardiac regular rate rhythm without any extra sounds or murmur heard Respiratory.  Effort normal no respiratory distress without wheeze GI.  Soft nontender positive bowel sounds without rebound Neurological.  Patient is alert dysarthric but intelligible follows commands.  Appears to have fair insight and awareness. Motor.  Right upper extremity 0 out of 5 proximal and distal Right lower extremity hip flexion knee extension 4 out of 5 ankle dorsiflexion 0 out of 5 Left upper extremity 5 out of 5 proximal distal Left lower extremity 5 out of 5 proximal to distal Skin.  Warm and dry  /She  has had improvement in activity tolerance, balance, postural control as well as ability to compensate for deficits. Megan Martinez has had improvement in functional use RUE/LUE  and RLE/LLE as well as improvement in awareness.  Working with energy conservation techniques.  Patient ambulates 100 feet with a quad cane and minimal assistance using a right AFO and heel wedge.  Patient perform squat isometric hold sessions without upper extremity support.  Patient transferred to supine with supervision and supine patient perform modified hamstring bridges with therapist holding feet up x15.  Patient transferred to supine and then sitting with supervision minimal assist.  Practiced wheelchair to tub bench transfers using semistand squat pivot with close supervision in both directions.  Patient self propelled wheelchair back to the room.  Patient independently recalled speech intelligibility strategies with 100% accuracy.  Full family teaching completed plan discharge to home       Disposition: Discharge to home    Diet: Regular  Special Instructions: No driving smoking or  alcohol  Continue aspirin 325 mg daily and Plavix 75 mg daily x3 months then Plavix alone  Follow-up outpatient interventional radiology for basilar artery stenting  Medications at discharge 1.  Tylenol as needed 2.  Norvasc 5 mg p.o. daily 3.  Aspirin 325 mg p.o. daily 4.  Lipitor 80 mg p.o. daily 5.  Plavix 75 mg p.o. daily 6.  Synthroid 137 mcg p.o. daily 7.  Protonix 40 mg p.o. daily  Discharge Instructions    Ambulatory referral to Neurology   Complete by: As directed    An appointment is requested in approximately: 4 weeks left pontine infarction due to basilar artery stenosis   Ambulatory referral to Physical Medicine Rehab   Complete by:  As directed    Moderate complexity follow-up 1 to 2 weeks left pontine infarction      Follow-up Information    Jamse Arn, MD Follow up.   Specialty: Physical Medicine and Rehabilitation Why: Office to call for appointment Contact information: [place] [address] [phone]        Luanne Bras, MD Follow up.   Specialties: Interventional Radiology, Radiology Why: Call for appointment Contact information: [place] [address] [phone]           Signed: Cathlyn Parsons 07/05/2019, 5:17 AM

## 2019-07-03 NOTE — Progress Notes (Signed)
Pt checked qh for safety. Bed low with fall alarm on and call bell within reach.  

## 2019-07-03 NOTE — Progress Notes (Signed)
Physical Therapy Session Note  Patient Details  Name: Megan Martinez MRN: [id number] Date of Birth: 1950-09-19  Today's Date: 07/03/2019 PT Individual Time: 1118-1200 and [id number] PT Individual Time Calculation (min): 42 min and 73 min   Short Term Goals: Week 2:  PT Short Term Goal 1 (Week 2): = to LTGs based on ELOS  Skilled Therapeutic Interventions/Progress Updates:    Session 1: Pt received sitting in w/c and agreeable to therapy session. Transported to/from gym in w/c for time management. Pt wearing R LE PLS AFO with heel wedge during session. Sit<>stands using RW with CGA for steadying and therapist reinforcing RUE position on RW orthotic and proper R LE positioning prior to initiating stance. Gait training ~19ft using RW with CGA/min assist for balance and pt demonstrating frequent R knee hyperextension during stance, cuing for correction but pt inconsistently able to prevent - therapist attempted readjusting AFO and tightening shoes for improved fit with minimal improvement in knee control. Gait training ~63ft using QC with min assist for balance and therapist providing support for R UE - pt demonstrates increased postural sway and increased R LE gait mechanic impairments with QC compared to with RW. Therapist recommends pt use RW as AD at D/C - pt in agreement - therapist provided foam cushion support for R hand on RW orthotic. Therapist reinforced pt's 3 HEPs to be performed in her room (seated heel slides, seated R hip flexion, supine heel slides). Performed seated R LE heel slides using wash cloth to decrease resistance x12 reps- pt able to perform increased ROM compared to prior session with this therapist. Stand pivot transfer w/c<>EOM, no AD, with CGA/min assist for balance, cuing for sequencing. Sitting>prone with min assist for R hemibody management. Prone R LE hamstring curls 2 x10 reps with pt demonstrating strong hamstring contraction and able to move through partial ROM with  minimal assistance - this is significantly improved from last time this therapist worked with patient. Supine R LE heel slides x15 reps with cuing to maintain heel contact on mat for increased hamstring activation and to control eccentric knee extension via use of hamstrings. Supine>sitting with supervision. Standing R LE NMR via R foot taps on 5" step using RW for B UE support - CGA for steadying and min assist for R LE management with cuing for increased hip/knee flexion to place foot up on step - 2 sets of 10 reps. Transported back to room and left seated in w/c with needs in reach and chair alarm on.  Session 2: Pt received sitting in w/c and agreeable to therapy session.  Transported to/from gym in w/c for time management. Megan Martinez, orthotist, present with pt's PLS AFO and continued to place heel wedge beneath AFO - donned max assist for time management. Sit<>stands using RW with CGA for steadying throughout session. Gait training ~45ft using RW with CGA for steadying and pt demonstrating only intermittent R knee hyperextension during stance this afternoon compared to this morning - demonstrates good foot clearance with the AFO DF assist though does have increased hip external rotation but with cuing able to correct. Standing with UE support on litegait donned litegait harness. Therapist strapped R UE support onto litegait handle via ACE wrap. Stepped on/off treadmill while in litegait harness with min assist for balance and cuing for sequencing.  Performed the following locomotor treadmill training trials using litegait harness for safety but not providing body weight support while targeting R LE NMR for improved gait mechanics: 1st: 43min24sec at 0.79mph  increased to 0.9mph totaling 441ft with pt using B UE support on litegait and therapist providing min assist to block R knee hyperextension during stance and for increased hip/knee flexion during swing - pt demonstrates forward trunk flexed posture with B UE  support on litegait 2nd: 26min21sec at 0.26mph totaling 558ft with pt only having R UE support on litegait via ACE wrap to improve upright trunk posture (no L UE support) and therapist continuing to provide min manual facilitation to block R knee hyperextension during stance (though improving) and increased hip/knee flexion during swing Stepped off treadmill and doffed harness as described above. Performed ~76ft overground gait training using RW with pt demonstrating significant R LE fatigue after the treadmill resulting in poor R LE foot clearance therefore deferred further gait at this time. Therapist educated pt on proper sequencing of LEs and AD for stepping on/off curb to replicate home entrance. Stepped on/off 7" height curb step x2 using RW with min assist for placing AD on/off step and min assist for balance - cuing for sequencing. Transported back to room in w/c. Pt called her daughter [name]) and boyfriend [name]) to ensure they can attend hands-on family training tomorrow during OT and PT sessions. Pt left seated in w/c with needs in reach, R UE supported on lap tray, and chair alarm on.   Therapy Documentation Precautions:  Precautions Precautions: Fall Precaution Comments: right hemiparesis Restrictions Weight Bearing Restrictions: No Other Position/Activity Restrictions: RLE ace wrapped into dorsiflexion.  Pain: Session 1: Reported some R knee cap discomfort during prone hamstring curls - therapist supported thigh to clear knee from mat.   Session 2: No complaints of pain during session.   Therapy/Group: Individual Therapy  Tawana Scale, PT, DPT 07/03/2019, 8:03 AM

## 2019-07-03 NOTE — Progress Notes (Signed)
[place] PHYSICAL MEDICINE & REHABILITATION PROGRESS NOTE  Subjective/Complaints: Patient seen sitting up in her chair this morning.  She states she slept well overnight.  She states her hand orthoses was lost, so she was unable to wear overnight.  Discussed with nursing.  ROS: Denies CP, shortness of breath, nausea, vomiting, diarrhea.  Objective: Vital Signs: Blood pressure (!) 154/78, pulse (!) 56, temperature 98 F (36.7 C), temperature source Oral, resp. rate 18, height 5\' 2"  (1.575 m), weight 72.8 kg, SpO2 97 %. No results found. Recent Labs    07/02/19 0522  WBC 5.4  HGB 12.3  HCT 37.5  PLT 268   Recent Labs    07/02/19 0522  NA 144  K 3.6  CL 104  CO2 26  GLUCOSE 128*  BUN 13  CREATININE 0.89  CALCIUM 9.1    Physical Exam: BP (!) 154/78 (BP Location: Left Arm)   Pulse (!) 56   Temp 98 F (36.7 C) (Oral)   Resp 18   Ht 5\' 2"  (1.575 m)   Wt 72.8 kg   SpO2 97%   BMI 29.34 kg/m  Constitutional: No distress . Vital signs reviewed. HENT: Normocephalic.  Atraumatic. Eyes: EOMI. No discharge. Cardiovascular: No JVD. Respiratory: Normal effort.  No stridor. GI: Non-distended. Skin: Warm and dry.  Intact. Psych: Normal mood.  Normal behavior. Musc: No edema in extremities.  No tenderness in extremities. Neurological: Alert Dysarthria, stable She makes eye contact with examiner and follows simple commands.   Appears to have good insight and awareness. Motor: RUE: Shoulder abduction 2/5, elbow flexion 1/5, elbow extension 0/5, distally 0/5, stable RLE: Hip flexion, knee extension 4/5, ankle dorsiflexion 1/5 No increase in tone  Assessment/Plan: 1. Functional deficits secondary to left pontine infarct which require 3+ hours per day of interdisciplinary therapy in a comprehensive inpatient rehab setting.  Physiatrist is providing close team supervision and 24 hour management of active medical problems listed below.  Physiatrist and rehab team continue to  assess barriers to discharge/monitor patient progress toward functional and medical goals  Care Tool:  Bathing    Body parts bathed by patient: Right arm, Chest, Abdomen, Front perineal area, Buttocks, Right upper leg, Left upper leg, Right lower leg, Left lower leg, Face, Left arm   Body parts bathed by helper: Left arm     Bathing assist Assist Level: Contact Guard/Touching assist     Upper Body Dressing/Undressing Upper body dressing   What is the patient wearing?: Pull over shirt, Bra    Upper body assist Assist Level: Minimal Assistance - Patient > 75%    Lower Body Dressing/Undressing Lower body dressing    Lower body dressing activity did not occur: Safety/medical concerns What is the patient wearing?: Pants     Lower body assist Assist for lower body dressing: Minimal Assistance - Patient > 75%     Toileting Toileting    Toileting assist Assist for toileting: Minimal Assistance - Patient > 75%     Transfers Chair/bed transfer  Transfers assist  Chair/bed transfer activity did not occur: Safety/medical concerns  Chair/bed transfer assist level: Minimal Assistance - Patient > 75% Chair/bed transfer assistive device: Cane, Orthosis   Locomotion Ambulation   Ambulation assist      Assist level: Contact Guard/Touching assist Assistive device: Walker-rolling Max distance: 100 ft   Walk 10 feet activity   Assist     Assist level: Contact Guard/Touching assist Assistive device: Walker-rolling   Walk 50 feet activity   Assist  Walk 50 feet with 2 turns activity did not occur: Safety/medical concerns(R hemi, decreased strength/balance)  Assist level: Contact Guard/Touching assist Assistive device: Walker-rolling    Walk 150 feet activity   Assist Walk 150 feet activity did not occur: Safety/medical concerns(R hemi, decreased strength/balance)  Assist level: Total Assistance - Patient < 25% Assistive device: Lite Gait    Walk 10 feet on  uneven surface  activity   Assist Walk 10 feet on uneven surfaces activity did not occur: Safety/medical concerns(R hemi, decreased strength/balance)         Wheelchair     Assist Will patient use wheelchair at discharge?: No Type of Wheelchair: Manual    Wheelchair assist level: Independent Max wheelchair distance: 150'    Wheelchair 50 feet with 2 turns activity    Assist    Wheelchair 50 feet with 2 turns activity did not occur: Safety/medical concerns(R hemi, required skilled interventions for safe technique)   Assist Level: Independent   Wheelchair 150 feet activity     Assist Wheelchair 150 feet activity did not occur: Safety/medical concerns(R hemi, required skilled interventions for safe technique)   Assist Level: Independent      Medical Problem List and Plan: 1.  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    VIR-appreciate recs, plan for stenting on 2/15 as outpatient   Continue CIR   WHO/PRAFO qhs, discussed with nursing, will follow up with orthotist regarding new brace 2.  Antithrombotics: -DVT/anticoagulation: Lovenox   CBC within normal limits on 2/8             -antiplatelet therapy: Aspirin 325 mg daily, Plavix 75 mg daily x3 months then Plavix alone 3. Pain Management: Tylenol as needed 4. Mood: Provide emotional support             -antipsychotic agents: N/A 5. Neuropsych: This patient is capable of making decisions on her own behalf. 6. Skin/Wound Care: Routine skin checks 7. Fluids/Electrolytes/Nutrition: Routine in and outs 8.  Essential hypertension: Patient on Norvasc 10 mg daily and cadestran-HCTZ 32-25 mg daily prior to admission.  Resume as needed            Elevated, Norvasc 2.5 started on 2/9  Monitor with increased mobility 9.  Hypothyroidism.  Continue Synthroid 10.  Hyperlipidemia: Continue Lipitor 11.  Post stroke dysphagia             Diet advanced to regular thins 12.  Transaminitis: Resolved  LFTs within normal limits on 2/1 13.  Hypoalbuminemia, persistent on 2/8  Supplement initiated on 1/28 14.  Prediabetes  Elevated on 2/8  Monitor with increased mobility via BMP 15.  Likely CKD II  GFR >60 on 2/8  Continue to monitor   LOS: 13 days A FACE TO FACE EVALUATION WAS PERFORMED  Guerino Caporale Lorie Phenix 07/03/2019, 8:32 AM

## 2019-07-03 NOTE — Plan of Care (Signed)
  Problem: RH Stairs Goal: LTG Patient will ambulate up and down stairs w/assist (PT) Description: LTG: Patient will ambulate up and down # of stairs with assistance (PT) Flowsheets (Taken 07/03/2019 1548) LTG: Pt will ambulate up/down stairs assist needed:: (updated to reflect pt's home entry) Minimal Assistance - Patient > 75% LTG: Pt will  ambulate up and down number of stairs: 1step using LRAD for home access Note: updated to reflect pt's home entry

## 2019-07-03 NOTE — Progress Notes (Signed)
Speech Language Pathology Daily Session Note  Patient Details  Name: Megan Martinez MRN: [id number] Date of Birth: [birth year]  Today's Date: 07/03/2019 SLP Individual Time: 1000-1030 SLP Individual Time Calculation (min): 30 min  Short Term Goals: Week 2: SLP Short Term Goal 1 (Week 2): STG=LTG due to remaining length of stay  Skilled Therapeutic Interventions: Patient received skilled SLP services targeting speech goals. Patient independently recalled speech ineligibility strategies with 100% accuracy. Patient participated in a functional reading task for recipes requiring verbal cue x1 to slow rate of speech. During recipe reading task patient was ~90% intelligible. in structured conversational speech patient was aware of when she was speaking too fast stating "I need to slow down". During conversational speech patient was ~90% intelligible with verbal cue needed x1 to over-articulate to increase listener understanding. At the end of therapy session patient was upright in chair with family present in room.   Pain Pain Assessment Pain Scale: 0-10 Pain Score: 0-No pain  Therapy/Group: Individual Therapy  Cristy Folks 07/03/2019, 10:32 AM

## 2019-07-04 ENCOUNTER — Inpatient Hospital Stay (HOSPITAL_COMMUNITY): Payer: PPO | Admitting: Speech Pathology

## 2019-07-04 ENCOUNTER — Ambulatory Visit (HOSPITAL_COMMUNITY): Payer: PPO

## 2019-07-04 ENCOUNTER — Encounter (HOSPITAL_COMMUNITY): Payer: PPO | Admitting: Occupational Therapy

## 2019-07-04 ENCOUNTER — Inpatient Hospital Stay (HOSPITAL_COMMUNITY): Payer: PPO

## 2019-07-04 ENCOUNTER — Inpatient Hospital Stay (HOSPITAL_COMMUNITY): Payer: PPO | Admitting: *Deleted

## 2019-07-04 DIAGNOSIS — M21371 Foot drop, right foot: Secondary | ICD-10-CM

## 2019-07-04 DIAGNOSIS — M21331 Wrist drop, right wrist: Secondary | ICD-10-CM

## 2019-07-04 MED ORDER — ATORVASTATIN CALCIUM 80 MG PO TABS: 80.0000 mg | ORAL_TABLET | Freq: Every day | ORAL | 1 refills | Status: DC

## 2019-07-04 MED ORDER — AMLODIPINE BESYLATE 2.5 MG PO TABS
2.5000 mg | ORAL_TABLET | Freq: Once | ORAL | Status: AC
  Administered 2019-07-04: 2.5 mg via ORAL
  Filled 2019-07-04: qty 1

## 2019-07-04 MED ORDER — AMLODIPINE BESYLATE 5 MG PO TABS
5.0000 mg | ORAL_TABLET | Freq: Every day | ORAL | Status: DC
  Administered 2019-07-05: 5 mg via ORAL
  Filled 2019-07-04: qty 1

## 2019-07-04 MED ORDER — PANTOPRAZOLE SODIUM 40 MG PO TBEC: 40.0000 mg | DELAYED_RELEASE_TABLET | Freq: Every day | ORAL | 0 refills | Status: DC

## 2019-07-04 MED ORDER — ACETAMINOPHEN 325 MG PO TABS: 650.0000 mg | ORAL_TABLET | ORAL | Status: DC | PRN

## 2019-07-04 MED ORDER — LEVOTHYROXINE SODIUM 137 MCG PO TABS: 137.0000 ug | ORAL_TABLET | Freq: Every day | ORAL | 1 refills | Status: DC

## 2019-07-04 MED ORDER — CLOPIDOGREL BISULFATE 75 MG PO TABS: 75.0000 mg | ORAL_TABLET | Freq: Every day | ORAL | 1 refills | Status: DC

## 2019-07-04 NOTE — Patient Care Conference (Signed)
Inpatient RehabilitationTeam Conference and Plan of Care Update Date: 07/04/2019   Time: 11:10 AM   Patient Name: Megan Martinez      Medical Record Number: [id number]  Date of Birth: [birth year] Sex: Female         Room/Bed: 4M09C/4M09C-01 Payor Info: Payor: [employer] / Plan: [employer] Must PPO / Product Type: *No Product type* /    Admit Date/Time:  06/20/2019  3:55 PM  Primary Diagnosis:  Left pontine cerebrovascular accident [facility])  Patient Active Problem List   Diagnosis Date Noted  . Right wrist drop   . Right foot drop   . Hypokalemia   . Stenosis of artery ([place])   . Benign essential HTN   . Stage 2 chronic kidney disease   . Prediabetes   . Transaminitis   . Hypoalbuminemia due to protein-calorie malnutrition ([place])   . Labile blood pressure   . Left pontine cerebrovascular accident ([place]) 06/20/2019  . Acute ischemic stroke ([place])   . Right hemiparesis ([place])   . Dysphagia, post-stroke   . Basilar artery stenosis with infarction [facility]) s/p tPA 06/19/2019  . Stroke (cerebrum) ([place]) L pontine d/t BA stenosis 06/14/2019  . Subcutaneous nodule of left foot 01/03/2019  . Atopic dermatitis 10/18/2018  . Seasonal allergies 08/09/2018  . 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 Osteoarthritis of carpometacarpal ([facility]) joint of thumb 11/07/2017  . Acquired hypothyroidism 08/04/2015  . Essential hypertension 08/04/2015  . GERD without esophagitis 08/04/2015  . Mixed hyperlipidemia 08/04/2015  . Osteopenia 08/04/2015  . Primary insomnia 08/04/2015  . Pure hypertriglyceridemia 08/04/2015    Expected Discharge Date: Expected Discharge Date: 07/05/19  Team Members Present: Physician leading conference: Dr. Delice Lesch Social Worker Present: [name], LCSW Nurse Present: Judee Clara, LPN Case Manager: [name], RN PT Present: [name], PT OT Present: [name], OT SLP Present: Weston Anna, SLP PPS  Coordinator present : [name], [name], SLP     Current Status/Progress Goal Weekly Team Focus  Bowel/Bladder   continent b+b  remain continent of b/b  assist to commode/peri-care   Swallow/Nutrition/ Hydration   Regular textures with thin liquids, Mod I  Mod I  Goal Met   ADL's   Pt now at goal level and ready for d/c home tomorrow  mod I toileting, toilet transfers, Supervision with bathing, dressing, shower stall transfers  Pt education, HEP   Mobility   CGA sit<>stand and stand pivot transfers using RW, CGA gait up to 152f using RW and locomotor treadmill training up to 5072f min assist 1 step using RW  supervision transfers and houeshold gait; CGA for stairs  gait training, R LE NMR, standing balance, stair navigation, activity tolerance, B LE strengthening, activity tolerance, transfer training   Communication   Supervision  Supervision A  Family Education   Safety/Cognition/ Behavioral Observations            Pain   no c/o pain  continue to be pain free  provide care/support to maintain pain free state   Skin   warm, dry, intact; ecchymosis  keep skin free from exceptions  continue  assessments    Rehab Goals Patient on target to meet rehab goals: Yes *See Care Plan and progress notes for long and short-term goals.     Barriers to Discharge  Current Status/Progress Possible Resolutions Date Resolved   Nursing  PT                    OT                  SLP                SW                Discharge Planning/Teaching Needs:  Home with boyfriend and daughter to provide 24/7 supervision  Teaching taking place with boyfriend and daughter today.   Team Discussion: Medically stable, needs brace before she DCs.  RN cont B/B, some bruising.  OT at goal level.  PT at goal level.  SLP at goal level.  Fam ed today.   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 Weekly Focus/Goal: Improve mobility, BP, bracing  Barriers to Discharge: Medical stability;Other (comments)  Barriers to Discharge Comments: Basilar artery stenosis Possible Resolutions to Barriers: Therapies, optimize BP meds, basilar artery stenting on 2/15, WHO   Continued Need for Acute Rehabilitation Level of Care: The patient requires daily medical management by a physician with specialized training in physical medicine and rehabilitation for the following reasons: Direction of a multidisciplinary physical rehabilitation program to maximize functional independence : Yes Medical management of patient stability for increased activity during participation in an intensive rehabilitation regime.: Yes Analysis of laboratory values and/or radiology reports with any subsequent need for medication adjustment and/or medical intervention. : Yes   I attest that I was present, lead the team conference, and concur with the assessment and plan of the team.   Jodell Cipro M 07/04/2019, 9:14 PM   Team conference was held via web/ teleconference due to [place] - 19

## 2019-07-04 NOTE — Progress Notes (Signed)
Recreational Therapy Session Note  Patient Details  Name: AFSHEEN LEWALLEN MRN: [id number] Date of Birth: [birth year] Today's Date: 07/04/2019 Time:  1345-1420 Pain: no c/o Skilled Therapeutic Interventions/Progress Updates: Session focused on discharge planning, home safety/activity modifications in regards to leisure and review of relaxation training.  Pt stated excitement about discharge and has already begun planning for use of her time, activity modifications, is Mod I.  Reviewed safety concerns in regards to pet care and pt stated understanding.  Also reviewed relaxation strategies- diaphragmatic breathing and progressive relaxation and recommended setting aside a block of time each day to practice to techniques.  Pt stated understanding and feels prepared for discharge.  Therapy/Group: Individual Therapy Nichola Cieslinski 07/04/2019, 3:34 PM

## 2019-07-04 NOTE — Progress Notes (Signed)
Social Work Patient ID: Megan Martinez, female   DOB: 28-Sep-1950, 69 y.o.   MRN: [id number]  Pt reports feeling ready for d/c tomorrow.  Education completed this morning with boyfriend and her daughter.  DME and OP f/u arranged.  Azan Maneri, LCSW

## 2019-07-04 NOTE — Progress Notes (Signed)
[place] PHYSICAL MEDICINE & REHABILITATION PROGRESS NOTE  Subjective/Complaints: Patient seen sitting up in her chair this morning.  Husband at bedside.  Patient with questions regarding that procedure.  Husband questions regarding discharge procedure.  She still does not have her WHO, discussed with nursing again.  ROS: Denies CP, shortness of breath, nausea, vomiting, diarrhea.  Objective: Vital Signs: Blood pressure (!) 144/74, pulse 63, temperature 98.3 F (36.8 C), resp. rate 20, height 5\' 2"  (1.575 m), weight 72.8 kg, SpO2 94 %. No results found. Recent Labs    07/02/19 0522  WBC 5.4  HGB 12.3  HCT 37.5  PLT 268   Recent Labs    07/02/19 0522  NA 144  K 3.6  CL 104  CO2 26  GLUCOSE 128*  BUN 13  CREATININE 0.89  CALCIUM 9.1    Physical Exam: BP (!) 144/74 (BP Location: Left Arm)   Pulse 63   Temp 98.3 F (36.8 C)   Resp 20   Ht 5\' 2"  (1.575 m)   Wt 72.8 kg   SpO2 94%   BMI 29.34 kg/m  Constitutional: No distress . Vital signs reviewed. HENT: Normocephalic.  Atraumatic. Eyes: EOMI. No discharge. Cardiovascular: No JVD. Respiratory: Normal effort.  No stridor. GI: Non-distended. Skin: Warm and dry.  Intact. Psych: Normal mood.  Normal behavior. Musc: No edema in extremities.  No tenderness in extremities. Neurological: Alert Dysarthria, unchanged She makes eye contact with examiner and follows simple commands.   Appears to have good insight and awareness. Motor: RUE: Shoulder abduction 2/5, elbow flexion 1/5, elbow extension 0/5, distally 0/5, unchanged RLE: Hip flexion, knee extension 4/5, ankle dorsiflexion 1/5, unchanged No increase in tone  Assessment/Plan: 1. Functional deficits secondary to left pontine infarct which require 3+ hours per day of interdisciplinary therapy in a comprehensive inpatient rehab setting.  Physiatrist is providing close team supervision and 24 hour management of active medical problems listed below.  Physiatrist  and rehab team continue to assess barriers to discharge/monitor patient progress toward functional and medical goals  Care Tool:  Bathing    Body parts bathed by patient: Right arm, Chest, Abdomen, Front perineal area, Buttocks, Right upper leg, Left upper leg, Right lower leg, Left lower leg, Face, Left arm   Body parts bathed by helper: Left arm     Bathing assist Assist Level: Contact Guard/Touching assist     Upper Body Dressing/Undressing Upper body dressing   What is the patient wearing?: Pull over shirt, Bra    Upper body assist Assist Level: Minimal Assistance - Patient > 75%    Lower Body Dressing/Undressing Lower body dressing    Lower body dressing activity did not occur: Safety/medical concerns What is the patient wearing?: Pants     Lower body assist Assist for lower body dressing: Minimal Assistance - Patient > 75%     Toileting Toileting    Toileting assist Assist for toileting: Minimal Assistance - Patient > 75%     Transfers Chair/bed transfer  Transfers assist  Chair/bed transfer activity did not occur: Safety/medical concerns  Chair/bed transfer assist level: Minimal Assistance - Patient > 75% Chair/bed transfer assistive device: Bedrails   Locomotion Ambulation   Ambulation assist      Assist level: Contact Guard/Touching assist Assistive device: Walker-rolling Max distance: 60ft   Walk 10 feet activity   Assist     Assist level: Contact Guard/Touching assist Assistive device: Walker-rolling   Walk 50 feet activity   Assist Walk 50 feet with  2 turns activity did not occur: Safety/medical concerns(R hemi, decreased strength/balance)  Assist level: Contact Guard/Touching assist Assistive device: Walker-rolling    Walk 150 feet activity   Assist Walk 150 feet activity did not occur: Safety/medical concerns(R hemi, decreased strength/balance)  Assist level: Total Assistance - Patient < 25% Assistive device: Lite Gait     Walk 10 feet on uneven surface  activity   Assist Walk 10 feet on uneven surfaces activity did not occur: Safety/medical concerns(R hemi, decreased strength/balance)         Wheelchair     Assist Will patient use wheelchair at discharge?: No Type of Wheelchair: Manual    Wheelchair assist level: Independent Max wheelchair distance: 150'    Wheelchair 50 feet with 2 turns activity    Assist    Wheelchair 50 feet with 2 turns activity did not occur: Safety/medical concerns(R hemi, required skilled interventions for safe technique)   Assist Level: Independent   Wheelchair 150 feet activity     Assist Wheelchair 150 feet activity did not occur: Safety/medical concerns(R hemi, required skilled interventions for safe technique)   Assist Level: Independent      Medical Problem List and Plan: 1.  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    VIR-appreciate recs, plan for stenting on 2/15 as outpatient   Continue CIR   WHO/PRAFO qhs, discussed with nursing again, will follow up with orthotist regarding new WHO for wrist drop  Team conference today to discuss current and goals and coordination of care, home and environmental barriers, and discharge planning with nursing, case manager, and therapies.  2.  Antithrombotics: -DVT/anticoagulation: Lovenox   CBC within normal limits on 2/8             -antiplatelet therapy: Aspirin 325 mg daily, Plavix 75 mg daily x3 months then Plavix alone 3. Pain Management: Tylenol as needed 4. Mood: Provide emotional support             -antipsychotic agents: N/A 5. Neuropsych: This patient is capable of making decisions on her own behalf. 6. Skin/Wound Care: Routine skin checks 7. Fluids/Electrolytes/Nutrition: Routine in and outs 8.  Essential hypertension: Patient on Norvasc 10 mg daily and cadestran-HCTZ 32-25 mg daily prior to  admission.  Resume as needed            Elevated, Norvasc 2.5 started on 2/9, increased again on 2/10  Monitor with increased mobility 9.  Hypothyroidism.  Continue Synthroid 10.  Hyperlipidemia: Continue Lipitor 11.  Post stroke dysphagia            Diet advanced to regular thins 12.  Transaminitis: Resolved  LFTs within normal limits on 2/1 13.  Hypoalbuminemia, persistent on 2/8  Supplement initiated on 1/28 14.  Prediabetes  Elevated on 2/8  Monitor with increased mobility via BMP 15.  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

## 2019-07-04 NOTE — Progress Notes (Signed)
Occupational Therapy Discharge Summary  Patient Details  Name: Megan Martinez MRN: [id number] Date of Birth: November 28, 1950     Patient has met 9 of 9 long term goals due to improved balance, postural control, ability to compensate for deficits, functional use of  RIGHT upper and RIGHT lower extremity and improved coordination.  Patient to discharge at overall Supervision level.  Patient's care partner is independent to provide the necessary physical assistance at discharge.    Reasons goals not met:  n/a  Recommendation:  Patient will benefit from ongoing skilled OT services in outpatient setting to continue to advance functional skills in the area of BADL and iADL.  Equipment: tub bench  Reasons for discharge: treatment goals met  Patient/family agrees with progress made and goals achieved: Yes  OT Discharge Precautions/Restrictions  Precautions Precautions: Fall Restrictions Weight Bearing Restrictions: No  ADL ADL Eating: Independent Grooming: Independent Where Assessed-Grooming: Standing at sink Upper Body Bathing: Supervision/safety Where Assessed-Upper Body Bathing: Shower Lower Body Bathing: Supervision/safety Where Assessed-Lower Body Bathing: Shower Upper Body Dressing: Supervision/safety Where Assessed-Upper Body Dressing: Wheelchair Lower Body Dressing: Supervision/safety Where Assessed-Lower Body Dressing: Wheelchair Toileting: Modified independent Where Assessed-Toileting: Risk analyst Method: Arts development officer: Energy manager: Close supervison Clinical cytogeneticist Method: Librarian, academic: Facilities manager: Close supervision Social research officer, government Method: Radiographer, therapeutic: Radio broadcast assistant, Grab bars Vision Baseline Vision/History: Wears glasses Wears Glasses: Reading only Vision Assessment?: No apparent  visual deficits Perception  Perception: Within Functional Limits Praxis Praxis: Intact Cognition Overall Cognitive Status: Within Functional Limits for tasks assessed Sensation Sensation Light Touch: Appears Intact Hot/Cold: Appears Intact Proprioception: Appears Intact Stereognosis: Impaired by gross Technical sales engineer - Discharge Observations: R hemiplegia Mobility    mod I with sit to stand and stand pivot to toilet with use of sink vanity for support;  S with tub bench transfers Trunk/Postural Assessment  Thoracic Assessment Thoracic Assessment: (rounded shoulders) Postural Control Protective Responses: WFL for basic self care tasks  Balance Static Sitting Balance Static Sitting - Level of Assistance: 7: Independent Dynamic Sitting Balance Dynamic Sitting - Level of Assistance: 7: Independent Static Standing Balance Static Standing - Level of Assistance: 6: Modified independent (Device/Increase time) Dynamic Standing Balance Dynamic Standing - Level of Assistance: 5: Stand by assistance Extremity/Trunk Assessment RUE Assessment Passive Range of Motion (PROM) Comments: WFL Active Range of Motion (AROM) Comments: sh flexion 10 degrees, abduction 20 degrees, 20 degrees of elbow flexion against gravity, active tricep activation and trace finger flexion.extension General Strength Comments: developing active movement in all muscles of RUE, improved from admission as she only had slight elevation RUE Body System: Neuro Brunstrum levels for arm and hand: Arm;Hand Brunstrum level for arm: Stage III Synergy is performed voluntarily Brunstrum level for hand: Stage II Synergy is developing RUE Tone RUE Tone: Modified Ashworth Body Part - Modified Ashworth Scale: Elbow;Wrist;Thumb;Fingers Elbow - Modified Ashworth Scale for Grading Hypertonia RUE: Slight increase in muscle tone, manifested by a catch and release or by minimal resistance at the end of the range of motion  when the affected part(s) is moved in flexion or extension Wrist - Modified Ashworth Scale for Grading Hypertonia RUE: Slight increase in muscle tone, manifested by a catch and release or by minimal resistance at the end of the range of motion when the affected part(s) is moved in flexion or extension Fingers - Modified Ashworth Scale for Grading  Hypertonia RUE: Slight increase in muscle tone, manifested by a catch and release or by minimal resistance at the end of the range of motion when the affected part(s) is moved in flexion or extension Thumb - Modified Ashworth Scale for Grading Hypertonia RUE: Slight increase in muscle tone, manifested by a catch and release or by minimal resistance at the end of the range of motion when the affected part(s) is moved in flexion or extension LUE Assessment LUE Assessment: Within Functional Limits   Suren Payne 07/04/2019, 12:57 PM

## 2019-07-04 NOTE — Progress Notes (Signed)
Occupational Therapy Session Note  Patient Details  Name: Megan Martinez MRN: [id number] Date of Birth: [birth year]  Today's Date: 07/04/2019 OT Individual Time: 0945-1100 OT Individual Time Calculation (min): 75 min    Short Term Goals: Week 2:  OT Short Term Goal 1 (Week 2): STGs = LTGs  Skilled Therapeutic Interventions/Progress Updates:    Pt received in w/c with her daughter and boyfriend in the room for family education.   Demonstrated, reviewed, explained and return demonstration of: -Undressing and dressing with hemidressing techniques -toileting -Bathing at shower level -walkin shower transfer with bench for actual shower -Tub transfer with tub bench for what she will be doing at home -RUE arom of scapular adduction and shoulder roll -assisted sh flexion exercises by doing the "chair push" exercise -table top a/arom towel slides  Pt is demonstrating increased shoulder adduction strength and reviewed need for external rotation stretch. She also had trace finger flexion that was observed for the first time today.  Provided pt with HEP handouts.  Family comfortable with A patient and did not have further questions.    Therapy Documentation Precautions:  Precautions Precautions: Fall Precaution Comments: right hemiparesis Restrictions Weight Bearing Restrictions: No Other Position/Activity Restrictions: RLE ace wrapped into dorsiflexion.  Pain: Pain Assessment Pain Scale: 0-10 Pain Score: 0-No pain ADL: ADL Eating: Independent Grooming: Independent Where Assessed-Grooming: Standing at sink Upper Body Bathing: Supervision/safety Where Assessed-Upper Body Bathing: Shower Lower Body Bathing: Supervision/safety Where Assessed-Lower Body Bathing: Shower Upper Body Dressing: Supervision/safety Where Assessed-Upper Body Dressing: Wheelchair Lower Body Dressing: Supervision/safety Where Assessed-Lower Body Dressing: Wheelchair Toileting: Modified  independent Where Assessed-Toileting: Glass blower/designer: Diplomatic Services operational officer Method: Arts development officer: Energy manager: Close supervison Clinical cytogeneticist Method: Librarian, academic: Facilities manager: Close supervision Social research officer, government Method: Radiographer, therapeutic: Radio broadcast assistant, Grab bars   Therapy/Group: Individual Therapy  [place] 07/04/2019, 12:34 PM

## 2019-07-04 NOTE — Discharge Instructions (Signed)
Inpatient Rehab Discharge Instructions  Megan Martinez Discharge date and time: No discharge date for patient encounter.   Activities/Precautions/ Functional Status: Activity: activity as tolerated Diet: soft Wound Care: none needed Functional status:  ___ No restrictions     ___ Walk up steps independently ___ 24/7 supervision/assistance   ___ Walk up steps with assistance ___ Intermittent supervision/assistance  ___ Bathe/dress independently ___ Walk with walker     _x__ Bathe/dress with assistance ___ Walk Independently    ___ Shower independently ___ Walk with assistance    ___ Shower with assistance ___ No alcohol     ___ Return to work/school ________   COMMUNITY REFERRALS UPON DISCHARGE:    Outpatient: PT      OT    ST                  Agency:  [facility]    Phone: [phone]               Appointment Date/Time: 2/16 @ 4:15, 5:00 pm  (speech, occupational)                                                                2/17 @ 10:15 pm (physical)   Medical Equipment/Items Ordered:  Wheelchair, lap tray, walker, tub bench                                                      Agency/Supplier:  [facility] @ [phone]      Special Instructions: No driving smoking or alcohol  Aspirin 325 mg daily and Plavix 75 mg daily x3 months then Plavix alone  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  Most of the excess cardiovascular risk related to smoking disappears within a year of stopping.  Ask you doctor about anti-smoking medications  [place] Quit Line: 1-800-QUIT NOW  Free Smoking Cessation Classes [phone]  CHOLESTEROL Know your levels; limit fat & cholesterol in your diet  Lipid Panel       Component Value Date/Time   CHOL 204 (H) 06/15/2019 0644   TRIG 161 (H) 06/15/2019 0644   HDL 35 (L) 06/15/2019 0644   CHOLHDL 5.8 06/15/2019 0644   VLDL 32 06/15/2019 0644   LDLCALC 137 (H) 06/15/2019 0644      Many patients benefit from treatment even if their cholesterol is at goal.  Goal: Total Cholesterol (CHOL) less than 160  Goal:  Triglycerides (TRIG) less than 150  Goal:  HDL greater than 40  Goal:  LDL (LDLCALC) less than 100   BLOOD PRESSURE American Stroke Association blood pressure target is less that 120/80 mm/Hg  Your discharge blood pressure is:  BP: 123/80  Monitor your blood pressure  Limit your salt and alcohol intake  Many individuals will require more than one medication for high blood pressure  DIABETES (  A1c is a blood sugar average for last 3 months) Goal HGBA1c is under 7% (HBGA1c is blood sugar average for last 3 months)  Diabetes: No known diagnosis of diabetes    Lab Results  Component Value Date   HGBA1C 5.8 (H) 06/15/2019     Your HGBA1c can be lowered with medications, healthy diet, and exercise.  Check your blood sugar as directed by your physician  Call your physician if you experience unexplained or low blood sugars.  PHYSICAL ACTIVITY/REHABILITATION Goal is 30 minutes at least 4 days per week  Activity: Increase activity slowly, Therapies: Physical Therapy: Home Health Return to work:   Activity decreases your risk of heart attack and stroke and makes your heart stronger.  It helps control your weight and blood pressure; helps you relax and can improve your mood.  Participate in a regular exercise program.  Talk with your doctor about the best form of exercise for you (dancing, walking, swimming, cycling).  DIET/WEIGHT Goal is to maintain a healthy weight  Your discharge diet is:  Diet Order            DIET DYS 3 Room service appropriate? Yes; Fluid consistency: Thin  Diet effective now              liquids Your height  is:  Height: 5\' 2"  (157.5 cm) Your current weight is: Weight: 72.8 kg Your Body Mass Index (BMI) is:  BMI (Calculated): 29.33  Following the type of diet specifically designed for you will help prevent another stroke.  Your goal weight range is:    Your goal Body Mass Index (BMI) is 19-24.  Healthy food habits can help reduce 3 risk factors for stroke:  High cholesterol, hypertension, and excess weight.  RESOURCES Stroke/Support Group:  Call [phone]   STROKE EDUCATION PROVIDED/REVIEWED AND GIVEN TO PATIENT Stroke warning signs and symptoms How to activate emergency medical system (call 911). Medications prescribed at discharge. Need for follow-up after discharge. Personal risk factors for stroke. Pneumonia vaccine given:  Flu vaccine given:  My questions have been answered, the writing is legible, and I understand these instructions.  I will adhere to these goals & educational materials that have been provided to me after my discharge from the hospital.      My questions have been answered and I understand these instructions. I will adhere to these goals and the provided educational materials after my discharge from the hospital.  Patient/Caregiver Signature _______________________________ Date __________  Clinician Signature _______________________________________ Date __________  Please bring this form and your medication list with you to all your follow-up doctor's appointments.

## 2019-07-04 NOTE — Progress Notes (Signed)
Pt checked qh for safety. Bed low with fall alarm on and call bell within reach.  

## 2019-07-04 NOTE — Progress Notes (Signed)
Physical Therapy Discharge Summary  Patient Details  Name: Megan Martinez MRN: [id number] Date of Birth: 1951/02/25  Today's Date: 07/04/2019 PT Individual Time: 1130-1200 and 6144-3154 PT Individual Time Calculation (min): 30 min and 57 minutes   Patient has met 8 of 8 long term goals due to improved activity tolerance, improved balance, improved postural control, increased strength, ability to compensate for deficits and improved attention.  Patient to discharge at an ambulatory level Supervision.   Patient's care partner is independent to provide the necessary physical assistance at discharge. Pt's family (daughter) have completed hands on family education regarding step negotiation, ambulation with RW, and helping pt with AFO. They are able to provided supervision/assist as needed, otherwise pt is Mod I for transfers.   All goals met.   Recommendation:  Patient will benefit from ongoing skilled PT services in outpatient setting to continue to advance safe functional mobility, address ongoing impairments in balance, strength, R LE neuro re-ed, gait, and minimize fall risk.  Equipment: w/c and RW  Reasons for discharge: treatment goals met  Patient/family agrees with progress made and goals achieved: Yes   PT Treatment Interventions: Session 1: Pt seated in w/c upon PT arrival, agreeable to therapy tx and denies pain. Pt's family present for family education this session- daughter. Pt propelled w/c throughout the unit this session Mod I >150 ft using L hemi propulsion technique. Pt ambulated x 150 ft this session with RW and supervision, educated family on cues to provide pt for safety and how to guard. Pt ascended/descended 1 platform step with RW and CGA, assist with lifting/lowering RW and then pt performed this again x 1 with assist from her daughter. Pt propelled to the ortho gym. Pt performed stand pivot to sedan car height with supervision but requires CGA for transfer to SUV car  height with step/grab bar and cues for techniques. Handout provided for HEP and reviewed this with pt and family. Pt left in w/c with needs in reach and chair alarm set.   Session 2: Pt seated in w/c upon PT arrival, agreeable to therapy tx and denies pain. Pt propelled w/c to the gym mod I. Stand pivot to the mat Mod I. Pt ambulated x 15 ft to the steps with RW and supervision, ascended/descended 12 steps with L rail and CGA, step to pattern with cues for techniques. Pt ambulated back to mat with supervision. Pt worked on hip abductor strengthening and dynamic standing balance to perform lateral sidestepping in place x 15 per LE without UE support, cues for L knee control. Pt performed sidestepping in front of the mat without UE support working on dynamic balance and hip strength 5 x 8 ft in each direction with cues for R knee control to limit hyperextension during stance. Attempted to perform standing hamstring curl, pt able to initiate movement/activation but can't move fully against gravity. Pt transferred to prone on mat independently. Performed active assisted prone hamstring curls. Pt transferred to sidelying and performed clamshells with R LE for hip abductor strengthening, cues for hold at top of range, 2 x 15. Transferred to supine in dependently. Performed 2 x 10 modified hamstring bridges, x 10 R LE single leg bridges and then R LE PNF D2 flexion/extension against manual resistance x 10 - all working on neuro re-ed for R LE. Transferred to sitting independently. In sitting worked on active assisted R shoulder flexion/extension x 10. Pt transferred to w/c mod I and propelled back to room. Pt left in w/c  with needs in reach and chair alarm set.   PT Discharge Precautions/Restrictions Precautions Precautions: Fall Precaution Comments: right hemiparesis Restrictions Weight Bearing Restrictions: No Vital Signs Therapy Vitals Temp: 97.9 F (36.6 C) Temp Source: Oral Pulse Rate: 66 Resp:  17 BP: (!) 159/73 Patient Position (if appropriate): Sitting Oxygen Therapy SpO2: 96 % O2 Device: Room Air Pain Pain Assessment Pain Scale: 0-10 Pain Score: 0-No pain Vision/Perception  Perception Perception: Within Functional Limits Praxis Praxis: Intact  Cognition Overall Cognitive Status: Within Functional Limits for tasks assessed Arousal/Alertness: Awake/alert Orientation Level: Oriented X4 Attention: Focused;Sustained Focused Attention: Appears intact Sustained Attention: Appears intact Awareness: Appears intact Problem Solving: Appears intact Safety/Judgment: Appears intact Sensation Sensation Light Touch: Appears Intact Hot/Cold: Appears Intact Proprioception: Appears Intact Stereognosis: Impaired by gross assessment Additional Comments: R LE tested for proprioception, intact 5/5 Coordination Gross Motor Movements are Fluid and Coordinated: No Fine Motor Movements are Fluid and Coordinated: No Coordination and Movement Description: cordination limited by R hemiparesis Motor  Motor Motor: Hemiplegia;Abnormal postural alignment and control Motor - Discharge Observations: R hemiplegia  Mobility Bed Mobility Bed Mobility: Rolling Right;Rolling Left;Supine to Sit;Sit to Supine Rolling Right: Independent Rolling Left: Independent Supine to Sit: Independent Sit to Supine: Independent Transfers Transfers: Sit to Stand;Stand to Sit;Stand Pivot Transfers;Squat Pivot Transfers Sit to Stand: Independent with assistive device Stand to Sit: Independent with assistive device Stand Pivot Transfers: Independent with assistive device Transfer (Assistive device): Rolling walker Locomotion  Gait Ambulation: Yes Gait Assistance: Supervision/Verbal cueing Gait Distance (Feet): 150 Feet Assistive device: Rolling walker Gait Assistance Details: Verbal cues for technique;Verbal cues for safe use of DME/AE;Verbal cues for precautions/safety Gait Gait: Yes Gait Pattern:  Impaired Gait Pattern: Decreased step length - left;Decreased dorsiflexion - right;Decreased weight shift to right;Poor foot clearance - right Gait velocity: decreased Stairs / Additional Locomotion Stairs: Yes Stairs Assistance: Contact Guard/Touching assist Stair Management Technique: One rail Left Number of Stairs: 12 Wheelchair Mobility Wheelchair Mobility: Yes Wheelchair Assistance: Independent with assistive device Wheelchair Propulsion: Left upper extremity;Left lower extremity Wheelchair Parts Management: Independent Distance: 150 ft  Trunk/Postural Assessment  Cervical Assessment Cervical Assessment: Within Functional Limits Thoracic Assessment Thoracic Assessment: Within Functional Limits Lumbar Assessment Lumbar Assessment: Within Functional Limits Postural Control Postural Control: Within Functional Limits Protective Responses: WFL for basic self care tasks  Balance Balance Balance Assessed: Yes Static Sitting Balance Static Sitting - Balance Support: No upper extremity supported Static Sitting - Level of Assistance: 7: Independent Dynamic Sitting Balance Dynamic Sitting - Balance Support: No upper extremity supported Dynamic Sitting - Level of Assistance: 7: Independent Static Standing Balance Static Standing - Balance Support: During functional activity;Bilateral upper extremity supported Static Standing - Level of Assistance: 6: Modified independent (Device/Increase time) Dynamic Standing Balance Dynamic Standing - Balance Support: Bilateral upper extremity supported;During functional activity Dynamic Standing - Level of Assistance: 5: Stand by assistance Extremity Assessment  RLE Assessment RLE Assessment: Exceptions to [facility] Passive Range of Motion (PROM) Comments: limited DF to neutral RLE Strength Right Hip Flexion: 2+/5 Right Hip Extension: 3-/5 Right Hip ABduction: 2+/5 Right Knee Flexion: 2+/5 Right Knee Extension: 4/5 Right Ankle Dorsiflexion:  1/5 Right Ankle Plantar Flexion: 3+/5 LLE Assessment LLE Assessment: Within Functional Limits General Strength Comments: Grossly in sitting: 5/5 throughout    Netta Corrigan, PT, DPT, CSRS 07/04/2019, 3:42 PM

## 2019-07-04 NOTE — Progress Notes (Signed)
Speech Language Pathology Discharge Summary  Patient Details  Name: Megan Martinez MRN: [id number] Date of Birth: 1950-12-21  Today's Date: 07/04/2019 SLP Individual Time: 0816-0900 SLP Individual Time Calculation (min): 44 min   Skilled Therapeutic Interventions:  Pt was seen for skilled ST targeting communication goals. Pt verbally independently recalled intelligibility strategies. SLP facilitated session with both unstructured and structures conversation level tasks, during which pt was Mod I for use of slow rate and overarticulation to achieve ~95% intelligibility. Nursing student was also present to observe session, and given that she was unfamiliar listener, she also reported 90-95% intelligibility throughout session. SLP also provided education to pt regarding fluctuations in speech intelligibility with various levels of fatigue and discusses heightened awareness of need for repetition using slow rate, emphasis on reading listener's nonverbal cues of misunderstanding. Pt left sitting in chair with alarm set and needs within reach. Continue per current plan of care.      Patient has met 4 of 4 long term goals.  Patient to discharge at overall Modified Independent level.  Reasons goals not met: n/a   Clinical Impression/Discharge Summary:   Pt made functional gains and met 4 out of 4 long term goals this admission. Pt is currently requires Mod I for use of speech intelligibility strategies and is 90-95% intelligible at the conversation level due to dysarthria with a mild functional impact on communication. Pt is consuming regular texture diet with thin liquids and Mod I for use of safe swallow strategies; repeat instrumental MBSS confirmed pt no longer needs to use compensatory maneuvers for safe intake of liquids and/or solids. Pt has demonstrated improved oropharyngeal swallow function evidenced by both bedside presentation and instrumental MBSS. She has also demonstrated greatly improved  speech intelligibility at the conversation level and is awareness of need to implement slow rate and overarticulation. However, given mild communication deficits still present at the conversation level, pt would benefit from follow up outpatient skilled ST at discharge. Pt education is complete at this time; family member was present for ST session yesterday and observed pt's progress with communication.    Care Partner:  Caregiver Able to Provide Assistance: Yes     Recommendation:  Outpatient SLP  Rationale for SLP Follow Up: Maximize functional communication   Equipment: none   Reasons for discharge: Discharged from hospital   Patient/Family Agrees with Progress Made and Goals Achieved: Yes    Arbutus Leas 07/04/2019, 7:27 AM

## 2019-07-05 ENCOUNTER — Other Ambulatory Visit: Payer: Self-pay | Admitting: Radiology

## 2019-07-05 MED ORDER — AMLODIPINE BESYLATE 5 MG PO TABS: 5.0000 mg | ORAL_TABLET | Freq: Every day | ORAL | 1 refills | Status: DC

## 2019-07-05 NOTE — Progress Notes (Signed)
Social Work Discharge Note   The overall goal for the admission was met for:   Discharge location: Yes - home with boyfriend and daughter to provide 24/7 assistance  Length of Stay: Yes- 15 days  Discharge activity level: Yes - supervision  Home/community participation: Yes  Services provided included: MD, RD, PT, OT, SLP, RN, TR, Pharmacy, Neuropsych and SW  Financial Services: Private Insurance: [employer]  Follow-up services arranged: Outpatient: PT, OT, ST via [facility], DME: 18x18 lightweight w/c with right 1/2 lap tray, cushion, rolling walker, tub bench via [place]  and Patient/Family has no preference for HH/DME agencies  Comments (or additional information):  Contact - pt @ [phone]  Patient/Family verbalized understanding of follow-up arrangements: Yes  Individual responsible for coordination of the follow-up plan: pt  Confirmed correct DME delivered: Archibald Marchetta 07/05/2019    Kambrey Hagger

## 2019-07-05 NOTE — Progress Notes (Signed)
Patient discharged home with family, all belongings sent with patient. No complications noted at this time. Katalia Choma D Jalisha Enneking, LPN 

## 2019-07-05 NOTE — Progress Notes (Signed)
Recreational Therapy Discharge Summary Patient Details  Name: Megan Martinez MRN: [id number] Date of Birth: March 25, 1951 Today's Date: 07/05/2019  Long term goals set: 1  Long term goals met: 1  Comments on progress toward goals: Pt has made good progress during LOS and is ready for discharge home today.  TR sessions/education focused on activity analysis identifying potential modifications and relaxation training. Relaxation training included diaphragmatic breathing and guided imagery.  Pt stated understanding, appreciation for education and is Mod I with use.  Reasons for discharge: discharge from hospital  Patient/family agrees with progress made and goals achieved: Yes  Daran Favaro 07/05/2019, 8:15 AM

## 2019-07-06 ENCOUNTER — Other Ambulatory Visit: Payer: Self-pay

## 2019-07-06 ENCOUNTER — Other Ambulatory Visit: Payer: Self-pay | Admitting: Student

## 2019-07-06 ENCOUNTER — Telehealth: Payer: Self-pay

## 2019-07-06 ENCOUNTER — Encounter (HOSPITAL_COMMUNITY): Payer: Self-pay | Admitting: Interventional Radiology

## 2019-07-06 NOTE — Progress Notes (Signed)
Spoke with pt for pre-op call. Pt just discharged from hospital yesterday after having a stroke. Pt's speech is slightly slurred, she states she has no use of her right arm. Pt denies heart history. Pt states she is not diabetic.   Pt will go for a Covid test tomorrow, 07/07/19. Gave pt quarantine instructions and she voiced understanding.

## 2019-07-06 NOTE — Telephone Encounter (Signed)
  TRANSITIONAL CARE CALL FIRST attempt made, no answer, left voice mail to return call  Appointment Date and Time: 07-19-2019 / 11:40AM  With: DR. PATEL

## 2019-07-06 NOTE — Anesthesia Preprocedure Evaluation (Addendum)
Anesthesia Evaluation  Patient identified by MRN, date of birth, ID band Patient awake    Reviewed: Allergy & Precautions, H&P , NPO status , Patient's Chart, lab work & pertinent test results  Airway Mallampati: II   Neck ROM: full    Dental   Pulmonary former smoker,    breath sounds clear to auscultation       Cardiovascular hypertension,  Rhythm:regular Rate:Normal     Neuro/Psych CVA, Residual Symptoms    GI/Hepatic GERD  ,  Endo/Other  Hypothyroidism   Renal/GU      Musculoskeletal  (+) Arthritis ,   Abdominal   Peds  Hematology   Anesthesia Other Findings   Reproductive/Obstetrics                            Anesthesia Physical Anesthesia Plan  ASA: III  Anesthesia Plan: General   Post-op Pain Management:    Induction: Intravenous  PONV Risk Score and Plan: 3 and Ondansetron, Midazolam, Dexamethasone and Treatment may vary due to age or medical condition  Airway Management Planned: Oral ETT  Additional Equipment: Arterial line  Intra-op Plan:   Post-operative Plan: Extubation in OR  Informed Consent: I have reviewed the patients History and Physical, chart, labs and discussed the procedure including the risks, benefits and alternatives for the proposed anesthesia with the patient or authorized representative who has indicated his/her understanding and acceptance.       Plan Discussed with: CRNA, Anesthesiologist and Surgeon  Anesthesia Plan Comments: (PAT note written 07/06/2019 by Myra Gianotti, PA-C. )       Anesthesia Quick Evaluation

## 2019-07-06 NOTE — Progress Notes (Signed)
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 Estanislado Pandy". 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 Discharge medications included Plavix 75 mg and ASA 325 mg daily.   History includes former smoker (quit 05/24/1968), HTN, hypothyroidism, GERD, left breast biopsy (01/09/18: intraductal papilloma, usual ductal hyperplasia, negative for carcinoma), CVA (left pontine infarct 06/14/19, presented with right hemiparesis and dysarthria, s/p tPA).   She is for pre-procedure COVID-19 swab on 07/07/19. Labs noted from 07/02/19. Also for labs on the day of procedure per IR. Notes indicate still with right sided weakness, particularly RUE, and with slight dysarthria but discharged with regular diet/thin liquids.    VS:   BP Readings from Last 3 Encounters:  07/05/19 (!) 120/59  06/20/19 132/65  03/20/19 (!) 145/79   Pulse Readings from Last 3 Encounters:  07/05/19 (!) 57  06/20/19 61  03/20/19 (!) 54    PROVIDERS: [name], DO is PCP   LABS: Lab results as of 07/02/19 include: Lab Results  Component Value Date   WBC 5.4 07/02/2019   HGB 12.3 07/02/2019   HCT 37.5 07/02/2019   PLT 268 07/02/2019   GLUCOSE 128 (H) 07/02/2019   CHOL 204 (H) 06/15/2019   TRIG 161 (H) 06/15/2019   HDL 35 (L) 06/15/2019   LDLCALC 137 (H) 06/15/2019   ALT 25 07/02/2019   AST 18 07/02/2019   NA 144 07/02/2019   K 3.6 07/02/2019   CL 104 07/02/2019   CREATININE 0.89 07/02/2019   BUN 13 07/02/2019   CO2 26 07/02/2019   TSH 1.249  06/16/2019   INR 1.0 06/14/2019   HGBA1C 5.8 (H) 06/15/2019      IMAGES: CT head (post-infarct) 06/15/19: IMPRESSION: Low-density throughout the left side of the pons consistent with the acute infarction. No hemorrhagic transformation. No hydrocephalus.  MRI Brain 06/14/19: IMPRESSION: 1. 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 2. 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 3. No other acute intracranial abnormality.  CT perfusion brain/CTA head/neck 06/14/19: IMPRESSION: 1. 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 2. Moderate proximal PCA stenoses bilaterally. 3. 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 4. CT perfusion demonstrates no anterior circulation infarct or ischemia. 5. Question ischemia within the cerebellum. 6. Atherosclerotic changes at the left proximal ICA and bilateral cavernous internal carotid arteries without significant stenosis in the anterior circulation.   EKG: 06/14/19 Sinus rhythm Low voltage, extremity leads Nonspecific repol abnormality, diffuse leads Prolonged QT interval (QT/QTc 446/537 ms) Poor data quality No old tracing to compare Confirmed by Sherwood Gambler [phone]) on 06/14/2019 8:41:14 AM   CV: Echo 06/14/19: IMPRESSIONS  1. Left ventricular ejection fraction, by visual estimation, is 60 to  65%. The left ventricle has normal function. There is no left ventricular  hypertrophy.  2. Left ventricular diastolic parameters are consistent with Grade I  diastolic dysfunction (impaired relaxation).  3. The left ventricle has no regional wall motion abnormalities.  4. Global right ventricle has normal systolic  function.The right  ventricular size is normal. No increase in right ventricular wall  thickness.  5. Left atrial size was normal.  6. Right atrial size was normal.  7. Mild  mitral annular calcification.  8. The mitral valve is normal in structure. No evidence of mitral valve  regurgitation. No evidence of mitral stenosis.  9. The tricuspid valve is normal in structure. Tricuspid valve  regurgitation is trivial.  10. The aortic valve is tricuspid. Aortic valve regurgitation is not  visualized. No evidence of aortic valve sclerosis or stenosis.  11. The inferior vena cava is normal in size with greater than 50%  respiratory variability, suggesting right atrial pressure of 3 mmHg.  12. TR signal is inadequate for assessing pulmonary artery systolic  pressure.    Past Medical History:  Diagnosis Date  . Allergy   . Arthritis   . GERD (gastroesophageal reflux disease)   . Hypertension   . Hypothyroidism   . Pneumonia   . Stroke [facility])    right arm is "not working", speech a little slurred  . Thyroid disease     Past Surgical History:  Procedure Laterality Date  . ABDOMINAL HYSTERECTOMY    . APPENDECTOMY    . BREAST SURGERY Right    lumpectomy  . CATARACT EXTRACTION    . CHOLECYSTECTOMY    . TUBAL LIGATION      MEDICATIONS: No current facility-administered medications for this encounter.   Marland Kitchen acetaminophen (TYLENOL) 325 MG tablet  . amLODipine (NORVASC) 5 MG tablet  . aspirin EC 325 MG EC tablet  . atorvastatin (LIPITOR) 80 MG tablet  . clopidogrel (PLAVIX) 75 MG tablet  . fluticasone (FLONASE) 50 MCG/ACT nasal spray  . levothyroxine (SYNTHROID) 137 MCG tablet  . pantoprazole (PROTONIX) 40 MG tablet    Myra Gianotti, PA-C Surgical Short Stay/Anesthesiology [facility] Phone [phone] [facility] Phone [phone] 07/06/2019 3:04 PM

## 2019-07-07 ENCOUNTER — Other Ambulatory Visit (HOSPITAL_COMMUNITY)
Admission: RE | Admit: 2019-07-07 | Discharge: 2019-07-07 | Disposition: A | Discharge status: Home / Self Care | Payer: PPO | Source: Ambulatory Visit | Attending: Interventional Radiology | Admitting: Interventional Radiology

## 2019-07-07 DIAGNOSIS — Z20822 Contact with and (suspected) exposure to covid-19: Secondary | ICD-10-CM | POA: Diagnosis not present

## 2019-07-07 DIAGNOSIS — Z01812 Encounter for preprocedural laboratory examination: Secondary | ICD-10-CM | POA: Insufficient documentation

## 2019-07-07 LAB — SARS CORONAVIRUS 2 (TAT 6-24 HRS): SARS Coronavirus 2: NEGATIVE

## 2019-07-08 ENCOUNTER — Other Ambulatory Visit (HOSPITAL_COMMUNITY): Payer: Self-pay | Admitting: Interventional Radiology

## 2019-07-08 DIAGNOSIS — I771 Stricture of artery: Secondary | ICD-10-CM

## 2019-07-08 DIAGNOSIS — I639 Cerebral infarction, unspecified: Secondary | ICD-10-CM

## 2019-07-09 ENCOUNTER — Encounter (HOSPITAL_COMMUNITY)
Admission: RE | Disposition: A | Discharge status: Home / Self Care | Payer: Self-pay | Source: Home / Self Care | Attending: Interventional Radiology

## 2019-07-09 ENCOUNTER — Ambulatory Visit (HOSPITAL_COMMUNITY): Payer: PPO | Admitting: Anesthesiology

## 2019-07-09 ENCOUNTER — Ambulatory Visit (HOSPITAL_COMMUNITY): Payer: PPO

## 2019-07-09 ENCOUNTER — Ambulatory Visit (HOSPITAL_COMMUNITY): Admit: 2019-07-09 | Payer: PPO

## 2019-07-09 ENCOUNTER — Ambulatory Visit (HOSPITAL_COMMUNITY)
Admission: RE | Admit: 2019-07-09 | Discharge: 2019-07-09 | Disposition: A | Discharge status: Home / Self Care | Payer: PPO | Attending: Interventional Radiology | Admitting: Interventional Radiology

## 2019-07-09 ENCOUNTER — Ambulatory Visit (HOSPITAL_COMMUNITY)
Admission: RE | Admit: 2019-07-09 | Discharge: 2019-07-09 | Disposition: A | Discharge status: Home / Self Care | Payer: PPO | Source: Ambulatory Visit | Attending: Interventional Radiology | Admitting: Interventional Radiology

## 2019-07-09 ENCOUNTER — Encounter (HOSPITAL_COMMUNITY): Payer: Self-pay

## 2019-07-09 ENCOUNTER — Other Ambulatory Visit: Payer: Self-pay

## 2019-07-09 ENCOUNTER — Encounter (HOSPITAL_COMMUNITY): Payer: Self-pay | Admitting: Interventional Radiology

## 2019-07-09 DIAGNOSIS — K219 Gastro-esophageal reflux disease without esophagitis: Secondary | ICD-10-CM | POA: Diagnosis not present

## 2019-07-09 DIAGNOSIS — I10 Essential (primary) hypertension: Secondary | ICD-10-CM | POA: Insufficient documentation

## 2019-07-09 DIAGNOSIS — I639 Cerebral infarction, unspecified: Secondary | ICD-10-CM

## 2019-07-09 DIAGNOSIS — Z7902 Long term (current) use of antithrombotics/antiplatelets: Secondary | ICD-10-CM | POA: Diagnosis not present

## 2019-07-09 DIAGNOSIS — Z539 Procedure and treatment not carried out, unspecified reason: Secondary | ICD-10-CM | POA: Insufficient documentation

## 2019-07-09 DIAGNOSIS — I771 Stricture of artery: Secondary | ICD-10-CM | POA: Insufficient documentation

## 2019-07-09 DIAGNOSIS — E039 Hypothyroidism, unspecified: Secondary | ICD-10-CM | POA: Insufficient documentation

## 2019-07-09 DIAGNOSIS — Z87891 Personal history of nicotine dependence: Secondary | ICD-10-CM | POA: Insufficient documentation

## 2019-07-09 DIAGNOSIS — Z79899 Other long term (current) drug therapy: Secondary | ICD-10-CM | POA: Insufficient documentation

## 2019-07-09 DIAGNOSIS — Z7982 Long term (current) use of aspirin: Secondary | ICD-10-CM | POA: Diagnosis not present

## 2019-07-09 HISTORY — PX: RADIOLOGY WITH ANESTHESIA: SHX6223

## 2019-07-09 HISTORY — DX: Pneumonia, unspecified organism: J18.9

## 2019-07-09 HISTORY — DX: Gastro-esophageal reflux disease without esophagitis: K21.9

## 2019-07-09 HISTORY — DX: Hypothyroidism, unspecified: E03.9

## 2019-07-09 HISTORY — DX: Cerebral infarction, unspecified: I63.9

## 2019-07-09 LAB — CBC WITH DIFFERENTIAL/PLATELET
Abs Immature Granulocytes: 0.01 10*3/uL (ref 0.00–0.07)
Basophils Absolute: 0 10*3/uL (ref 0.0–0.1)
Basophils Relative: 1 %
Eosinophils Absolute: 0.1 10*3/uL (ref 0.0–0.5)
Eosinophils Relative: 2 %
HCT: 44 % (ref 36.0–46.0)
Hemoglobin: 14.5 g/dL (ref 12.0–15.0)
Immature Granulocytes: 0 %
Lymphocytes Relative: 18 %
Lymphs Abs: 1.2 10*3/uL (ref 0.7–4.0)
MCH: 29.2 pg (ref 26.0–34.0)
MCHC: 33 g/dL (ref 30.0–36.0)
MCV: 88.5 fL (ref 80.0–100.0)
Monocytes Absolute: 0.3 10*3/uL (ref 0.1–1.0)
Monocytes Relative: 5 %
Neutro Abs: 4.8 10*3/uL (ref 1.7–7.7)
Neutrophils Relative %: 74 %
Platelets: 301 10*3/uL (ref 150–400)
RBC: 4.97 MIL/uL (ref 3.87–5.11)
RDW: 13.5 % (ref 11.5–15.5)
WBC: 6.4 10*3/uL (ref 4.0–10.5)
nRBC: 0 % (ref 0.0–0.2)

## 2019-07-09 LAB — BASIC METABOLIC PANEL
Anion gap: 10 (ref 5–15)
BUN: 10 mg/dL (ref 8–23)
CO2: 27 mmol/L (ref 22–32)
Calcium: 9.4 mg/dL (ref 8.9–10.3)
Chloride: 105 mmol/L (ref 98–111)
Creatinine, Ser: 0.78 mg/dL (ref 0.44–1.00)
GFR calc Af Amer: >60 mL/min (ref >60)
GFR calc non Af Amer: >60 mL/min (ref >60)
Glucose, Bld: 124 mg/dL — ABNORMAL HIGH (ref 70–99)
Potassium: 3.2 mmol/L — ABNORMAL LOW (ref 3.5–5.1)
Sodium: 142 mmol/L (ref 135–145)

## 2019-07-09 LAB — URINALYSIS, COMPLETE (UACMP) WITH MICROSCOPIC
Bilirubin Urine: NEGATIVE
Glucose, UA: NEGATIVE mg/dL
Hgb urine dipstick: NEGATIVE
Ketones, ur: NEGATIVE mg/dL
Nitrite: NEGATIVE
Protein, ur: NEGATIVE mg/dL
Specific Gravity, Urine: 1.012 (ref 1.005–1.030)
pH: 7 (ref 5.0–8.0)

## 2019-07-09 LAB — PROTIME-INR
INR: 1 (ref 0.8–1.2)
Prothrombin Time: 13.5 seconds (ref 11.4–15.2)

## 2019-07-09 LAB — PLATELET INHIBITION P2Y12: Platelet Function  P2Y12: 149 [PRU] — ABNORMAL LOW (ref 182–335)

## 2019-07-09 SURGERY — RADIOLOGY WITH ANESTHESIA
Anesthesia: General

## 2019-07-09 MED ORDER — VANCOMYCIN HCL IN DEXTROSE 1-5 GM/200ML-% IV SOLN
1000.0000 mg | Freq: Once | INTRAVENOUS | Status: DC
  Filled 2019-07-09: qty 200

## 2019-07-09 MED ORDER — CLOPIDOGREL BISULFATE 75 MG PO TABS
75.0000 mg | ORAL_TABLET | ORAL | Status: AC
  Administered 2019-07-09: 75 mg via ORAL
  Filled 2019-07-09 (×2): qty 1

## 2019-07-09 MED ORDER — CEFAZOLIN SODIUM-DEXTROSE 2-4 GM/100ML-% IV SOLN
INTRAVENOUS | Status: AC
  Filled 2019-07-09: qty 100

## 2019-07-09 MED ORDER — NIMODIPINE 30 MG PO CAPS
0.0000 mg | ORAL_CAPSULE | ORAL | Status: AC
  Administered 2019-07-09: 60 mg via ORAL
  Filled 2019-07-09: qty 2

## 2019-07-09 MED ORDER — MIDAZOLAM HCL 5 MG/5ML IJ SOLN
INTRAMUSCULAR | Status: AC | PRN
  Administered 2019-07-09: 09:00:00 1 mg via INTRAVENOUS

## 2019-07-09 MED ORDER — VANCOMYCIN HCL 1000 MG IV SOLR: 1000.0000 mg | INTRAVENOUS | Status: DC

## 2019-07-09 MED ORDER — ASPIRIN EC 325 MG PO TBEC
325.0000 mg | DELAYED_RELEASE_TABLET | ORAL | Status: DC
  Filled 2019-07-09: qty 1

## 2019-07-09 MED ORDER — SODIUM CHLORIDE 0.9 % IV SOLN: INTRAVENOUS | Status: DC

## 2019-07-09 NOTE — H&P (Signed)
Chief Complaint: Patient was seen in consultation today for Basilar artery revascularization at the request of Dr Lavera Guise   Supervising Physician: Luanne Bras  Patient Status: [facility] - Out-pt  History of Present Illness: Megan Martinez is a 69 y.o. 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 2. Moderate proximal PCA stenoses bilaterally. 3. 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 4. CT perfusion demonstrates no anterior circulation infarct or ischemia. 5. Question ischemia within the cerebellum. 6. Atherosclerotic changes at the left proximal ICA and bilateral cavernous internal carotid arteries without significant stenosis in the anterior circulation.  Follows with Dr Erlinda Hong Was inpt at [facility] and [place] 07/05/19  Now planned for image-guided cerebral arteriogram with possible revascularization/angioplasty/stent placement of mid basilar artery stenosis   Taking ASA/Plavix daily LD this am  Pt still with slurred speech-- although much better per pt Still right arm/hand weakness-- little use of both Rt leg weakness-- uses brace and can walk slowly - with walker most times  Denies vision changes Denies headache  Past Medical History:  Diagnosis Date  . Allergy   . Arthritis   . GERD (gastroesophageal reflux disease)   . Hypertension   . Hypothyroidism   . Pneumonia   . Stroke [facility])    right arm is "not working", speech a little slurred  . Thyroid disease     Past Surgical History:  Procedure Laterality Date  . ABDOMINAL HYSTERECTOMY    . APPENDECTOMY    . BREAST SURGERY Right    lumpectomy  . CATARACT EXTRACTION    . CHOLECYSTECTOMY    . TUBAL LIGATION      Allergies: Latex, Statins, Penicillin g, Amoxicillin, and Codeine  Medications: Prior to Admission medications   Medication Sig Start Date End Date  Taking? Authorizing Provider  amLODipine (NORVASC) 5 MG tablet Take 1 tablet (5 mg total) by mouth daily. 07/06/19  Yes Angiulli, Lavon Paganini, PA-C  aspirin EC 325 MG EC tablet Take 1 tablet (325 mg total) by mouth daily. 06/20/19  Yes Donzetta Starch, NP  atorvastatin (LIPITOR) 80 MG tablet Take 1 tablet (80 mg total) by mouth daily at 6 PM. 07/04/19  Yes Angiulli, Lavon Paganini, PA-C  clopidogrel (PLAVIX) 75 MG tablet Take 1 tablet (75 mg total) by mouth daily. 07/04/19  Yes Angiulli, Lavon Paganini, PA-C  fluticasone (FLONASE) 50 MCG/ACT nasal spray Place 2 sprays into both nostrils daily. 08/09/18  Yes [name], DO  levothyroxine (SYNTHROID) 137 MCG tablet Take 1 tablet (137 mcg total) by mouth daily at 6 (six) AM. 07/04/19  Yes Angiulli, Lavon Paganini, PA-C  pantoprazole (PROTONIX) 40 MG tablet Take 1 tablet (40 mg total) by mouth at bedtime. 07/04/19  Yes Angiulli, Lavon Paganini, PA-C  acetaminophen (TYLENOL) 325 MG tablet Take 2 tablets (650 mg total) by mouth every 4 (four) hours as needed for mild pain (or temp > 37.5 C (99.5 F)). 07/04/19   Angiulli, Lavon Paganini, PA-C     History reviewed. No pertinent family history.  Social History   Socioeconomic History  . Marital status: Married    Spouse name: Not on file  . Number of children: Not on file  . Years of education: Not on file  . Highest education level: Not on file  Occupational History  . Not on file  Tobacco Use  . Smoking status:  Former Smoker    Quit date: 1970    Years since quitting: 51.1  . Smokeless tobacco: Never Used  Substance and Sexual Activity  . Alcohol use: Yes    Comment: occasional  . Drug use: Never  . Sexual activity: Not on file  Other Topics Concern  . Not on file  Social History Narrative  . Not on file   Social Determinants of Health   Financial Resource Strain:   . Difficulty of Paying Living Expenses: Not on file  Food Insecurity:   . Worried About Charity fundraiser in the Last Year: Not on file  . Ran Out of  Food in the Last Year: Not on file  Transportation Needs:   . Lack of Transportation (Medical): Not on file  . Lack of Transportation (Non-Medical): Not on file  Physical Activity:   . Days of Exercise per Week: Not on file  . Minutes of Exercise per Session: Not on file  Stress:   . Feeling of Stress : Not on file  Social Connections:   . Frequency of Communication with Friends and Family: Not on file  . Frequency of Social Gatherings with Friends and Family: Not on file  . Attends Religious Services: Not on file  . Active Member of Clubs or Organizations: Not on file  . Attends Archivist Meetings: Not on file  . Marital Status: Not on file    Review of Systems: A 12 point ROS discussed and pertinent positives are indicated in the HPI above.  All other systems are negative.  Review of Systems  Constitutional: Positive for activity change. Negative for fatigue and fever.  HENT: Negative for hearing loss and trouble swallowing.   Eyes: Negative for visual disturbance.  Respiratory: Negative for cough and shortness of breath.   Cardiovascular: Negative for chest pain.  Gastrointestinal: Negative for abdominal pain.  Musculoskeletal: Positive for gait problem. Negative for back pain.  Neurological: Positive for speech difficulty and weakness. Negative for dizziness, tremors, seizures, syncope, facial asymmetry, light-headedness, numbness and headaches.  Psychiatric/Behavioral: Negative for behavioral problems and confusion.    Vital Signs: BP (!) 168/97   Pulse 79   Temp 97.6 F (36.4 C)   Resp 20   Ht 5\' 2"  (1.575 m)   Wt 165 lb (74.8 kg)   SpO2 96%   BMI 30.18 kg/m   Physical Exam Vitals reviewed.  HENT:     Head: Atraumatic.     Mouth/Throat:     Mouth: Mucous membranes are dry.     Comments: Speech is slurred But better Easily understandable Eyes:     Extraocular Movements: Extraocular movements intact.  Cardiovascular:     Rate and Rhythm: Normal  rate and regular rhythm.     Heart sounds: Normal heart sounds.  Pulmonary:     Effort: Pulmonary effort is normal.     Breath sounds: Normal breath sounds.  Abdominal:     Palpations: Abdomen is soft.  Musculoskeletal:     Cervical back: Normal range of motion.     Comments: Right arm/hand with little use Able to grab with left for some support Right leg weak- wears brace and uses help to walk   Skin:    General: Skin is warm and dry.  Neurological:     Mental Status: She is alert and oriented to person, place, and time.  Psychiatric:        Behavior: Behavior normal.  Thought Content: Thought content normal.        Judgment: Judgment normal.     Imaging: CT Code Stroke CTA Head W/WO contrast  Result Date: 06/14/2019 CLINICAL DATA:  Ataxia.  Facial droop. EXAM: CT ANGIOGRAPHY HEAD AND NECK CT PERFUSION BRAIN TECHNIQUE: Multidetector CT imaging of the head and neck was performed using the standard protocol during bolus administration of intravenous contrast. Multiplanar CT image reconstructions and MIPs were obtained to evaluate the vascular anatomy. Carotid stenosis measurements (when applicable) are obtained utilizing NASCET criteria, using the distal internal carotid diameter as the denominator. Multiphase CT imaging of the brain was performed following IV bolus contrast injection. Subsequent parametric perfusion maps were calculated using RAPID software. CONTRAST:  129mL OMNIPAQUE IOHEXOL 350 MG/ML SOLN COMPARISON:  CT head without contrast of the same day. FINDINGS: CTA NECK FINDINGS Aortic arch: There is common origin of the left common carotid artery and the innominate artery. Aortic arch and great vessel origins are within normal limits otherwise. No atherosclerotic change, stenosis, or aneurysm is present. Right carotid system: The right common carotid artery is within normal limits. Bifurcation is unremarkable. Cervical right ICA is normal. Left carotid system: The left  common carotid artery is within normal limits. Mild atherosclerotic changes are noted in the proximal left ICA without stenosis significant stenosis relative to the more distal vessel. Vertebral arteries: The left vertebral artery is the dominant vessel. Both vertebral arteries originate from the subclavian arteries without significant stenosis. There is no significant stenosis of either vertebral artery in the neck. Skeleton: Degenerative anterolisthesis present C3-4 and C4-5. Endplate changes contribute to mild foraminal narrowing bilaterally at C5-6. No focal lytic or blastic lesions are present. Other neck: Neck soft tissues are otherwise within normal limits. Upper chest: Lung apices are clear. Thoracic inlet is within normal limits. Review of the MIP images confirms the above findings CTA HEAD FINDINGS Anterior circulation: Atherosclerotic changes are present within the cavernous internal carotid arteries bilaterally without significant stenosis through the ICA termini. Left posterior communicating artery is noted. The A1 and M1 segments are normal. Left A1 segment is dominant. The anterior communicating artery is patent. MCA bifurcations are within normal limits bilaterally. The ACA and MCA branch vessels are within normal limits. Posterior circulation: Atherosclerotic calcifications are present at the dural margin of the left vertebral artery. 50% stenosis is present. Is some narrowing of distal vertebral arteries scratched at there is some narrowing of distal V4 segments bilaterally. The proximal basilar artery is small. A high-grade mid basilar stenosis is present at the level of the pons. There is flow in the distal basilar artery. A left posterior communicating artery contributes. Moderate proximal PCA stenoses are present bilaterally. There is attenuation of distal PCA branch vessels, right greater than left. Venous sinuses: The dural sinuses are patent. The straight sinus deep cerebral veins are  intact. Cortical veins are unremarkable. No vascular malformations are present. Anatomic variants: Prominent left posterior communicating artery. Review of the MIP images confirms the above findings CT Brain Perfusion Findings: ASPECTS: 10/10 CBF (<30%) Volume: 70mL Perfusion (Tmax>6.0s) volume: 29mL. There is some indication of ischemia in the cerebellum when a shorter T-max is used. Mismatch Volume: 20mL Infarction Location:Posterior fossa ischemia IMPRESSION: 1. 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 2. Moderate proximal PCA stenoses bilaterally. 3. 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 4. CT perfusion demonstrates no anterior circulation infarct or ischemia. 5. Question ischemia within  the cerebellum. 6. Atherosclerotic changes at the left proximal ICA and bilateral cavernous internal carotid arteries without significant stenosis in the anterior circulation. These results were called by telephone at the time of interpretation on 06/14/2019 at 7:58 am to provider MCNEILL [facility] , who verbally acknowledged these results. Electronically Signed   By: San Morelle M.D.   On: 06/14/2019 08:13   CT HEAD WO CONTRAST  Result Date: 06/15/2019 CLINICAL DATA:  Follow-up stroke.  24 hours post tPA. EXAM: CT HEAD WITHOUT CONTRAST TECHNIQUE: Contiguous axial images were obtained from the base of the skull through the vertex without intravenous contrast. COMPARISON:  MRI and CT studies done yesterday. FINDINGS: Brain: Extensive low-density within the left side of the pons. No hemorrhagic transformation. No focal cerebellar finding. Cerebral hemispheres are normal. No hydrocephalus or extra-axial collection. Vascular: There is atherosclerotic calcification of the major vessels at the base of the brain. Skull: Negative Sinuses/Orbits: Clear/normal Other: None IMPRESSION: Low-density throughout the left side of the pons consistent  with the acute infarction. No hemorrhagic transformation. No hydrocephalus. Electronically Signed   By: [name] Chimes M.D.   On: 06/15/2019 08:08   CT Code Stroke CTA Neck W/WO contrast  Result Date: 06/14/2019 CLINICAL DATA:  Ataxia.  Facial droop. EXAM: CT ANGIOGRAPHY HEAD AND NECK CT PERFUSION BRAIN TECHNIQUE: Multidetector CT imaging of the head and neck was performed using the standard protocol during bolus administration of intravenous contrast. Multiplanar CT image reconstructions and MIPs were obtained to evaluate the vascular anatomy. Carotid stenosis measurements (when applicable) are obtained utilizing NASCET criteria, using the distal internal carotid diameter as the denominator. Multiphase CT imaging of the brain was performed following IV bolus contrast injection. Subsequent parametric perfusion maps were calculated using RAPID software. CONTRAST:  128mL OMNIPAQUE IOHEXOL 350 MG/ML SOLN COMPARISON:  CT head without contrast of the same day. FINDINGS: CTA NECK FINDINGS Aortic arch: There is common origin of the left common carotid artery and the innominate artery. Aortic arch and great vessel origins are within normal limits otherwise. No atherosclerotic change, stenosis, or aneurysm is present. Right carotid system: The right common carotid artery is within normal limits. Bifurcation is unremarkable. Cervical right ICA is normal. Left carotid system: The left common carotid artery is within normal limits. Mild atherosclerotic changes are noted in the proximal left ICA without stenosis significant stenosis relative to the more distal vessel. Vertebral arteries: The left vertebral artery is the dominant vessel. Both vertebral arteries originate from the subclavian arteries without significant stenosis. There is no significant stenosis of either vertebral artery in the neck. Skeleton: Degenerative anterolisthesis present C3-4 and C4-5. Endplate changes contribute to mild foraminal narrowing bilaterally  at C5-6. No focal lytic or blastic lesions are present. Other neck: Neck soft tissues are otherwise within normal limits. Upper chest: Lung apices are clear. Thoracic inlet is within normal limits. Review of the MIP images confirms the above findings CTA HEAD FINDINGS Anterior circulation: Atherosclerotic changes are present within the cavernous internal carotid arteries bilaterally without significant stenosis through the ICA termini. Left posterior communicating artery is noted. The A1 and M1 segments are normal. Left A1 segment is dominant. The anterior communicating artery is patent. MCA bifurcations are within normal limits bilaterally. The ACA and MCA branch vessels are within normal limits. Posterior circulation: Atherosclerotic calcifications are present at the dural margin of the left vertebral artery. 50% stenosis is present. Is some narrowing of distal vertebral arteries scratched at there is some narrowing of distal V4 segments bilaterally.  The proximal basilar artery is small. A high-grade mid basilar stenosis is present at the level of the pons. There is flow in the distal basilar artery. A left posterior communicating artery contributes. Moderate proximal PCA stenoses are present bilaterally. There is attenuation of distal PCA branch vessels, right greater than left. Venous sinuses: The dural sinuses are patent. The straight sinus deep cerebral veins are intact. Cortical veins are unremarkable. No vascular malformations are present. Anatomic variants: Prominent left posterior communicating artery. Review of the MIP images confirms the above findings CT Brain Perfusion Findings: ASPECTS: 10/10 CBF (<30%) Volume: 42mL Perfusion (Tmax>6.0s) volume: 65mL. There is some indication of ischemia in the cerebellum when a shorter T-max is used. Mismatch Volume: 77mL Infarction Location:Posterior fossa ischemia IMPRESSION: 1. 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 2. Moderate proximal PCA stenoses  bilaterally. 3. 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 4. CT perfusion demonstrates no anterior circulation infarct or ischemia. 5. Question ischemia within the cerebellum. 6. Atherosclerotic changes at the left proximal ICA and bilateral cavernous internal carotid arteries without significant stenosis in the anterior circulation. These results were called by telephone at the time of interpretation on 06/14/2019 at 7:58 am to provider MCNEILL [facility] , who verbally acknowledged these results. Electronically Signed   By: San Morelle M.D.   On: 06/14/2019 08:13   MR BRAIN WO CONTRAST  Result Date: 06/14/2019 CLINICAL DATA:  Wake up stroke. Ataxia and facial droop. EXAM: MRI HEAD WITHOUT CONTRAST TECHNIQUE: Multiplanar, multiecho pulse sequences of the brain and surrounding structures were obtained without intravenous contrast. COMPARISON:  CT head, CTA, and CT perfusion 06/14/2019 FINDINGS: Brain: Diffusion-weighted images demonstrate restricted diffusion in the left paramedian pons. No significant T2 or FLAIR signal is associated. There may be some diffusion abnormality in the midbrain. No acute hemorrhage is present. Minimal white matter disease is present otherwise, likely within normal limits for age. The ventricles are of normal size. No significant extraaxial fluid collection is present. Vascular: Abnormal flow signal is present in mid basilar artery consistent with known high-grade stenosis. Skull and upper cervical spine: The craniocervical junction is normal. Upper cervical spine is within normal limits. Marrow signal is unremarkable. Sinuses/Orbits: The paranasal sinuses and mastoid air cells are clear. Bilateral lens replacements are noted. Globes and orbits are otherwise unremarkable. IMPRESSION: 1. 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 2. High-grade  stenosis of the mid basilar artery. 3. No other acute intracranial abnormality. These results were called by telephone at the time of interpretation on 06/14/2019 at 8:07am to provider MCNEILL [facility] , who verbally acknowledged these results. Electronically Signed   By: San Morelle M.D.   On: 06/14/2019 08:17   CT Code Stroke Cerebral Perfusion with contrast  Result Date: 06/14/2019 CLINICAL DATA:  Ataxia.  Facial droop. EXAM: CT ANGIOGRAPHY HEAD AND NECK CT PERFUSION BRAIN TECHNIQUE: Multidetector CT imaging of the head and neck was performed using the standard protocol during bolus administration of intravenous contrast. Multiplanar CT image reconstructions and MIPs were obtained to evaluate the vascular anatomy. Carotid stenosis measurements (when applicable) are obtained utilizing NASCET criteria, using the distal internal carotid diameter as the denominator. Multiphase CT imaging of the brain was performed following IV bolus contrast injection. Subsequent parametric perfusion maps were calculated using RAPID software. CONTRAST:  168mL OMNIPAQUE IOHEXOL 350 MG/ML SOLN COMPARISON:  CT head without contrast of the same day. FINDINGS: CTA  NECK FINDINGS Aortic arch: There is common origin of the left common carotid artery and the innominate artery. Aortic arch and great vessel origins are within normal limits otherwise. No atherosclerotic change, stenosis, or aneurysm is present. Right carotid system: The right common carotid artery is within normal limits. Bifurcation is unremarkable. Cervical right ICA is normal. Left carotid system: The left common carotid artery is within normal limits. Mild atherosclerotic changes are noted in the proximal left ICA without stenosis significant stenosis relative to the more distal vessel. Vertebral arteries: The left vertebral artery is the dominant vessel. Both vertebral arteries originate from the subclavian arteries without significant stenosis. There is no  significant stenosis of either vertebral artery in the neck. Skeleton: Degenerative anterolisthesis present C3-4 and C4-5. Endplate changes contribute to mild foraminal narrowing bilaterally at C5-6. No focal lytic or blastic lesions are present. Other neck: Neck soft tissues are otherwise within normal limits. Upper chest: Lung apices are clear. Thoracic inlet is within normal limits. Review of the MIP images confirms the above findings CTA HEAD FINDINGS Anterior circulation: Atherosclerotic changes are present within the cavernous internal carotid arteries bilaterally without significant stenosis through the ICA termini. Left posterior communicating artery is noted. The A1 and M1 segments are normal. Left A1 segment is dominant. The anterior communicating artery is patent. MCA bifurcations are within normal limits bilaterally. The ACA and MCA branch vessels are within normal limits. Posterior circulation: Atherosclerotic calcifications are present at the dural margin of the left vertebral artery. 50% stenosis is present. Is some narrowing of distal vertebral arteries scratched at there is some narrowing of distal V4 segments bilaterally. The proximal basilar artery is small. A high-grade mid basilar stenosis is present at the level of the pons. There is flow in the distal basilar artery. A left posterior communicating artery contributes. Moderate proximal PCA stenoses are present bilaterally. There is attenuation of distal PCA branch vessels, right greater than left. Venous sinuses: The dural sinuses are patent. The straight sinus deep cerebral veins are intact. Cortical veins are unremarkable. No vascular malformations are present. Anatomic variants: Prominent left posterior communicating artery. Review of the MIP images confirms the above findings CT Brain Perfusion Findings: ASPECTS: 10/10 CBF (<30%) Volume: 75mL Perfusion (Tmax>6.0s) volume: 44mL. There is some indication of ischemia in the cerebellum when a  shorter T-max is used. Mismatch Volume: 58mL Infarction Location:Posterior fossa ischemia IMPRESSION: 1. 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 2. Moderate proximal PCA stenoses bilaterally. 3. 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 4. CT perfusion demonstrates no anterior circulation infarct or ischemia. 5. Question ischemia within the cerebellum. 6. Atherosclerotic changes at the left proximal ICA and bilateral cavernous internal carotid arteries without significant stenosis in the anterior circulation. These results were called by telephone at the time of interpretation on 06/14/2019 at 7:58 am to provider MCNEILL [facility] , who verbally acknowledged these results. Electronically Signed   By: San Morelle M.D.   On: 06/14/2019 08:13   DG Swallowing Func-Speech Pathology  Result Date: 06/27/2019 Objective Swallowing Evaluation: Type of Study: MBS-Modified Barium Swallow Study  Patient Details Name: KAROLE KOURIS MRN: [id number] Date of Birth: 05/05/1951 Today's Date: 06/27/2019 Time: SLP Start Time : 0902 -SLP Stop Time : 0915 SLP Time Calculation (min): 13 min Past Medical History: Past Medical History: Diagnosis Date . Allergy  . Arthritis  . Hypertension  . Thyroid disease  Past Surgical History: Past Surgical  History: Procedure Laterality Date . ABDOMINAL HYSTERECTOMY   . APPENDECTOMY   . CATARACT EXTRACTION   . CHOLECYSTECTOMY   . TUBAL LIGATION   HPI: 69 y.o. female with a history of hypertension admitted with right sided hemiparesis and facial asymmetry; received IV TPA.  Dx acute left paramedian pontine CVA. Pt admitted to [facility] 06/20/19.  Subjective: alert, cooporative Assessment / Plan / Recommendation CHL IP CLINICAL IMPRESSIONS 06/27/2019 Clinical Impression Pt presents with improved ability to protect her airway with thin liquids without need for use of any compensatory strategies since last MBSS 06/14/19. Pt has  a hx of silent aspiration, and previous instrumental study recommended chin tuck maneuver to prevent airway intrusion with thins. However, today no penetration or aspiration was observed throughout several trials of thin barium via cup and straw, or when consuming thin with barium pill. Pt's oral phase was only remarkable for reduced posterior propulsion of large chalky barium pill, and reduced cohesion of dual consistency thin with pill bolus. Given improvements in oropharyngeal swallow function, recommend pt continue current regular/thin diet and there is no longer a need for pt to perform chin tuck with liquids. Pt may consume small pills whole with thins, however large pills should either be broken to take with thin and/or consumed whole in applesauce (based on pt preference). SLP Visit Diagnosis Dysphagia, unspecified (R13.10) Attention and concentration deficit following -- Frontal lobe and executive function deficit following -- Impact on safety and function Mild aspiration risk   CHL IP TREATMENT RECOMMENDATION 06/15/2019 Treatment Recommendations Therapy as outlined in treatment plan below   Prognosis 06/27/2019 Prognosis for Safe Diet Advancement Good Barriers to Reach Goals -- Barriers/Prognosis Comment -- CHL IP DIET RECOMMENDATION 06/27/2019 SLP Diet Recommendations Regular solids;Thin liquid Liquid Administration via Cup;Straw Medication Administration Whole meds with liquid Compensations Minimize environmental distractions;Slow rate;Small sips/bites Postural Changes Remain semi-upright after after feeds/meals (Comment)   CHL IP OTHER RECOMMENDATIONS 06/27/2019 Recommended Consults -- Oral Care Recommendations Oral care BID Other Recommendations --   CHL IP FOLLOW UP RECOMMENDATIONS 06/18/2019 Follow up Recommendations Inpatient Rehab   CHL IP FREQUENCY AND DURATION 06/15/2019 Speech Therapy Frequency (ACUTE ONLY) min 2x/week Treatment Duration 2 weeks      CHL IP ORAL PHASE 06/27/2019 Oral Phase Impaired Oral -  Pudding Teaspoon -- Oral - Pudding Cup -- Oral - Honey Teaspoon -- Oral - Honey Cup -- Oral - Nectar Teaspoon -- Oral - Nectar Cup -- Oral - Nectar Straw -- Oral - Thin Teaspoon -- Oral - Thin Cup WFL Oral - Thin Straw WFL Oral - Puree NT Oral - Mech Soft NT Oral - Regular -- Oral - Multi-Consistency -- Oral - Pill Lingual pumping;Reduced posterior propulsion Oral Phase - Comment --  CHL IP PHARYNGEAL PHASE 06/27/2019 Pharyngeal Phase WFL Pharyngeal- Pudding Teaspoon -- Pharyngeal -- Pharyngeal- Pudding Cup -- Pharyngeal -- Pharyngeal- Honey Teaspoon -- Pharyngeal -- Pharyngeal- Honey Cup -- Pharyngeal -- Pharyngeal- Nectar Teaspoon -- Pharyngeal -- Pharyngeal- Nectar Cup -- Pharyngeal -- Pharyngeal- Nectar Straw -- Pharyngeal -- Pharyngeal- Thin Teaspoon -- Pharyngeal -- Pharyngeal- Thin Cup WFL Pharyngeal -- Pharyngeal- Thin Straw WFL Pharyngeal -- Pharyngeal- Puree NT Pharyngeal -- Pharyngeal- Mechanical Soft -- Pharyngeal -- Pharyngeal- Regular -- Pharyngeal -- Pharyngeal- Multi-consistency -- Pharyngeal -- Pharyngeal- Pill WFL Pharyngeal -- Pharyngeal Comment --  CHL IP CERVICAL ESOPHAGEAL PHASE 06/27/2019 Cervical Esophageal Phase WFL Pudding Teaspoon -- Pudding Cup -- Honey Teaspoon -- Honey Cup -- Nectar Teaspoon -- Nectar Cup -- Nectar Straw -- Thin Teaspoon --  Thin Cup -- Thin Straw -- Puree -- Mechanical Soft -- Regular -- Multi-consistency -- Pill -- Cervical Esophageal Comment -- Arbutus Leas 06/27/2019, 9:38 AM              DG Swallowing Func-Speech Pathology  Result Date: 06/15/2019 Objective Swallowing Evaluation: Type of Study: MBS-Modified Barium Swallow Study  Patient Details Name: ABRIELA BEFORT MRN: [id number] Date of Birth: [birth year] Today's Date: 06/15/2019 Time: SLP Start Time (ACUTE ONLY): 0950 -SLP Stop Time (ACUTE ONLY): 1010 SLP Time Calculation (min) (ACUTE ONLY): 20 min Past Medical History: Past Medical History: Diagnosis Date . Allergy  . Arthritis  . Hypertension  . Thyroid disease   Past Surgical History: Past Surgical History: Procedure Laterality Date . ABDOMINAL HYSTERECTOMY   . APPENDECTOMY   . CATARACT EXTRACTION   . CHOLECYSTECTOMY   . TUBAL LIGATION   HPI:  69 y.o. female with a history of hypertension admitted with right sided hemiparesis and facial asymmetry; received IV TPA.  Dx acute left paramedian pontine CVA.  Subjective: alert, cooporative Assessment / Plan / Recommendation CHL IP CLINICAL IMPRESSIONS 06/15/2019 Clinical Impression Patient presents with mild oropharyngeal dysphagia characterized by silent aspiration with thin liquids. Oral phase remarkable for reduced lingual strength resulting in lingual residue. Moments of clearing residue resulted in episodes of impaired timing. This along with reduced sensation resulted in silent aspiration of thin liquids. As pt utilized chin tuck with straw for thin liquids, no penetration/aspiration observed. With pt's motivation and utilization of compensatory strategies, good progress is suspected. Dysphagia 3 diet and thin liquids utilizing chin tuck recommended, with intermittent staff supervision. SLP Visit Diagnosis Dysarthria and anarthria (R47.1);Dysphagia, oropharyngeal phase (R13.12) Attention and concentration deficit following -- Frontal lobe and executive function deficit following -- Impact on safety and function Mild aspiration risk;Moderate aspiration risk   CHL IP TREATMENT RECOMMENDATION 06/15/2019 Treatment Recommendations Therapy as outlined in treatment plan below   Prognosis 06/15/2019 Prognosis for Safe Diet Advancement Good Barriers to Reach Goals -- Barriers/Prognosis Comment -- CHL IP DIET RECOMMENDATION 06/15/2019 SLP Diet Recommendations Thin liquid;Dysphagia 3 (Mech soft) solids Liquid Administration via Straw Medication Administration Whole meds with puree Compensations Chin tuck;Use straw to facilitate chin tuck Postural Changes Seated upright at 90 degrees   CHL IP OTHER RECOMMENDATIONS 06/15/2019 Recommended  Consults -- Oral Care Recommendations Oral care BID Other Recommendations Order thickener from pharmacy   CHL IP FOLLOW UP RECOMMENDATIONS 06/15/2019 Follow up Recommendations Inpatient Rehab   CHL IP FREQUENCY AND DURATION 06/15/2019 Speech Therapy Frequency (ACUTE ONLY) min 2x/week Treatment Duration 2 weeks      CHL IP ORAL PHASE 06/15/2019 Oral Phase Impaired Oral - Pudding Teaspoon -- Oral - Pudding Cup -- Oral - Honey Teaspoon -- Oral - Honey Cup -- Oral - Nectar Teaspoon -- Oral - Nectar Cup -- Oral - Nectar Straw -- Oral - Thin Teaspoon -- Oral - Thin Cup -- Oral - Thin Straw Lingual/palatal residue Oral - Puree Lingual/palatal residue;Premature spillage Oral - Mech Soft Lingual/palatal residue Oral - Regular -- Oral - Multi-Consistency -- Oral - Pill -- Oral Phase - Comment --  CHL IP PHARYNGEAL PHASE 06/15/2019 Pharyngeal Phase Impaired Pharyngeal- Pudding Teaspoon -- Pharyngeal -- Pharyngeal- Pudding Cup -- Pharyngeal -- Pharyngeal- Honey Teaspoon -- Pharyngeal -- Pharyngeal- Honey Cup -- Pharyngeal -- Pharyngeal- Nectar Teaspoon -- Pharyngeal -- Pharyngeal- Nectar Cup -- Pharyngeal -- Pharyngeal- Nectar Straw -- Pharyngeal -- Pharyngeal- Thin Teaspoon -- Pharyngeal -- Pharyngeal- Thin Cup -- Pharyngeal -- Pharyngeal- Thin Straw Penetration/Aspiration  during swallow;Trace aspiration;Pharyngeal residue - pyriform;Pharyngeal residue - valleculae;Reduced airway/laryngeal closure;Compensatory strategies attempted (with notebox) Pharyngeal Material enters airway, remains ABOVE vocal cords then ejected out Pharyngeal- Puree Reduced epiglottic inversion;Trace aspiration;Penetration/Aspiration during swallow;Reduced airway/laryngeal closure Pharyngeal Material enters airway, remains ABOVE vocal cords then ejected out Pharyngeal- Mechanical Soft -- Pharyngeal -- Pharyngeal- Regular -- Pharyngeal -- Pharyngeal- Multi-consistency -- Pharyngeal -- Pharyngeal- Pill -- Pharyngeal -- Pharyngeal Comment --  CHL IP CERVICAL  ESOPHAGEAL PHASE 06/15/2019 Cervical Esophageal Phase WFL Pudding Teaspoon -- Pudding Cup -- Honey Teaspoon -- Honey Cup -- Nectar Teaspoon -- Nectar Cup -- Nectar Straw -- Thin Teaspoon -- Thin Cup -- Thin Straw -- Puree -- Mechanical Soft -- Regular -- Multi-consistency -- Pill -- Cervical Esophageal Comment -- Note populated for Lenore Manner, Student SLP Osie Bond., M.A. [facility] Pager [phone] Office [phone] 06/15/2019, 2:39 PM              ECHOCARDIOGRAM COMPLETE  Result Date: 06/14/2019   ECHOCARDIOGRAM REPORT   Patient Name:   [facility] A Poupard Date of Exam: 06/14/2019 Medical Rec #:  [id number]        Height:       64.0 in Accession #:    [id number]       Weight:       169.0 lb Date of Birth:  10-06-1950         BSA:          1.82 m Patient Age:    37 years         BP:           114/101 mmHg Patient Gender: F                HR:           77 bpm. Exam Location:  Inpatient Procedure: 2D Echo Indications:    stroke 434.91  History:        Patient has no prior history of Echocardiogram examinations.                 Risk Factors:Hypertension and Former Smoker.  Sonographer:    Jannett Celestine RDCS (AE) Referring Phys: [phone] MCNEILL P KIRKPATRICK  Sonographer Comments: restricted mobility IMPRESSIONS  1. Left ventricular ejection fraction, by visual estimation, is 60 to 65%. The left ventricle has normal function. There is no left ventricular hypertrophy.  2. Left ventricular diastolic parameters are consistent with Grade I diastolic dysfunction (impaired relaxation).  3. The left ventricle has no regional wall motion abnormalities.  4. Global right ventricle has normal systolic function.The right ventricular size is normal. No increase in right ventricular wall thickness.  5. Left atrial size was normal.  6. Right atrial size was normal.  7. Mild mitral annular calcification.  8. The mitral valve is normal in structure. No evidence of mitral valve regurgitation. No evidence of  mitral stenosis.  9. The tricuspid valve is normal in structure. Tricuspid valve regurgitation is trivial. 10. The aortic valve is tricuspid. Aortic valve regurgitation is not visualized. No evidence of aortic valve sclerosis or stenosis. 11. The inferior vena cava is normal in size with greater than 50% respiratory variability, suggesting right atrial pressure of 3 mmHg. 12. TR signal is inadequate for assessing pulmonary artery systolic pressure. FINDINGS  Left Ventricle: Left ventricular ejection fraction, by visual estimation, is 60 to 65%. The left ventricle has normal function. The left ventricle has no regional wall motion abnormalities. The left ventricular internal cavity size was  the left ventricle is normal in size. There is no left ventricular hypertrophy. Left ventricular diastolic parameters are consistent with Grade I diastolic dysfunction (impaired relaxation). Right Ventricle: The right ventricular size is normal. No increase in right ventricular wall thickness. Global RV systolic function is has normal systolic function. Left Atrium: Left atrial size was normal in size. Right Atrium: Right atrial size was normal in size Pericardium: There is no evidence of pericardial effusion. Mitral Valve: The mitral valve is normal in structure. Mild mitral annular calcification. No evidence of mitral valve regurgitation. No evidence of mitral valve stenosis by observation. Tricuspid Valve: The tricuspid valve is normal in structure. Tricuspid valve regurgitation is trivial. Aortic Valve: The aortic valve is tricuspid. Aortic valve regurgitation is not visualized. The aortic valve is structurally normal, with no evidence of sclerosis or stenosis. Pulmonic Valve: The pulmonic valve was normal in structure. Pulmonic valve regurgitation is not visualized. Pulmonic regurgitation is not visualized. Aorta: The aortic root is normal in size and structure. Venous: The inferior vena cava is normal in size with greater  than 50% respiratory variability, suggesting right atrial pressure of 3 mmHg. IAS/Shunts: No atrial level shunt detected by color flow Doppler.  LEFT VENTRICLE PLAX 2D LVIDd:         3.30 cm  Diastology LVIDs:         1.70 cm  LV e' lateral:   15.20 cm/s LV PW:         1.10 cm  LV E/e' lateral: 4.1 LV IVS:        1.00 cm  LV e' medial:    8.92 cm/s LVOT diam:     1.90 cm  LV E/e' medial:  6.9 LV SV:         36 ml LV SV Index:   18.98 LVOT Area:     2.84 cm  RIGHT VENTRICLE TAPSE (M-mode): 1.9 cm LEFT ATRIUM             Index LA diam:        3.20 cm 1.76 cm/m LA Vol (A2C):   43.7 ml 23.99 ml/m LA Vol (A4C):   41.2 ml 22.62 ml/m LA Biplane Vol: 42.4 ml 23.28 ml/m  AORTIC VALVE LVOT Vmax:   90.10 cm/s LVOT Vmean:  63.600 cm/s LVOT VTI:    0.261 m  AORTA Ao Root diam: 2.90 cm MITRAL VALVE MV Area (PHT): 2.16 cm             SHUNTS MV PHT:        101.79 msec          Systemic VTI:  0.26 m MV Decel Time: 351 msec             Systemic Diam: 1.90 cm MV E velocity: 61.70 cm/s 103 cm/s MV A velocity: 43.30 cm/s 70.3 cm/s MV E/A ratio:  1.42       1.5  Loralie Champagne MD Electronically signed by Loralie Champagne MD Signature Date/Time: 06/14/2019/5:12:56 PM    Final    CT HEAD CODE STROKE WO CONTRAST  Result Date: 06/14/2019 CLINICAL DATA:  Code stroke. Ataxia. Facial droop and slurred speech. Last seen normal at 9:30 p.m. last night. EXAM: CT HEAD WITHOUT CONTRAST TECHNIQUE: Contiguous axial images were obtained from the base of the skull through the vertex without intravenous contrast. COMPARISON:  None. FINDINGS: Brain: No acute infarct, hemorrhage, or mass lesion is present. The ventricles are of normal size. No significant white matter lesions are present.  No significant extraaxial fluid collection is present. Vascular: Atherosclerotic calcifications are present within the cavernous internal carotid arteries bilaterally. There is no hyperdense vessel. Skull: Calvarium is intact. No focal lytic or blastic lesions are  present. No significant extracranial soft tissue lesion is present. Sinuses/Orbits: The paranasal sinuses and mastoid air cells are clear. Bilateral lens replacements are noted. Globes and orbits are otherwise unremarkable. ASPECTS [facility] Stroke Program Early CT Score) - Ganglionic level infarction (caudate, lentiform nuclei, internal capsule, insula, M1-M3 cortex): 7/7 - Supraganglionic infarction (M4-M6 cortex): 3/3 Total score (0-10 with 10 being normal): 10/10 IMPRESSION: 1. Negative CT of the head. 2. ASPECTS is 10/10 The above was relayed via text pager to Dr. Leonel Ramsay on 06/14/2019 at 07:32 . Electronically Signed   By: San Morelle M.D.   On: 06/14/2019 07:32    Labs:  CBC: Recent Labs    06/20/19 0417 06/21/19 0457 06/28/19 0601 07/02/19 0522  WBC 6.0 6.9 6.3 5.4  HGB 13.6 13.9 12.9 12.3  HCT 40.9 41.2 39.2 37.5  PLT 252 254 236 268    COAGS: Recent Labs    06/14/19 0719  INR 1.0  APTT 27    BMP: Recent Labs    06/21/19 0457 06/25/19 0556 06/27/19 1023 07/02/19 0522  NA 143 144 143 144  K 3.7 3.7 3.7 3.6  CL 110 106 107 104  CO2 25 26 27 26   GLUCOSE 105* 111* 128* 128*  BUN 15 14 15 13   CALCIUM 9.1 9.4 9.2 9.1  CREATININE 0.89 0.98 0.92 0.89  GFRNONAA >60 59* >60 >60  GFRAA >60 >60 >60 >60    LIVER FUNCTION TESTS: Recent Labs    06/14/19 0719 06/21/19 0457 06/25/19 0556 07/02/19 0522  BILITOT 0.5 0.6 0.9 0.5  AST 19 50* 25 18  ALT 19 53* 36 25  ALKPHOS 38 30* 31* 33*  PROT 6.5 6.2* 6.0* 5.9*  ALBUMIN 3.9 3.4* 3.5 3.2*    TUMOR MARKERS: No results for input(s): AFPTM, CEA, CA199, CHROMGRNA in the last 8760 hours.  Assessment and Plan:  CAV 06/14/19 Mid basilar artery stenosis For revascularization in IR today Risks and benefits of cerebral angiogram with intervention were discussed with the patient including, but not limited to bleeding, infection, vascular injury, contrast induced renal failure, stroke or even death.  This  interventional procedure involves the use of X-rays and because of the nature of the planned procedure, it is possible that we will have prolonged use of X-ray fluoroscopy.  Potential radiation risks to you include (but are not limited to) the following: - A slightly elevated risk for cancer  several years later in life. This risk is typically less than 0.5% percent. This risk is low in comparison to the normal incidence of human cancer, which is 33% for women and 50% for men according to the [employer]. - Radiation induced injury can include skin redness, resembling a rash, tissue breakdown / ulcers and hair loss (which can be temporary or permanent).   The likelihood of either of these occurring depends on the difficulty of the procedure and whether you are sensitive to radiation due to previous procedures, disease, or genetic conditions.   IF your procedure requires a prolonged use of radiation, you will be notified and given written instructions for further action.  It is your responsibility to monitor the irradiated area for the 2 weeks following the procedure and to notify your physician if you are concerned that you have suffered a radiation induced  injury.    All of the patient's questions were answered, patient is agreeable to proceed.  Consent signed and in chart.  Thank you for this interesting consult.  I greatly enjoyed meeting CAMBRIE HURRELL and look forward to participating in their care.  A copy of this report was sent to the requesting provider on this date.  Electronically Signed: Lavonia Drafts, PA-C 07/09/2019, 7:30 AM   I spent a total of    25 Minutes in face to face in clinical consultation, greater than 50% of which was counseling/coordinating care for Mid basilar artery stenosis angioplasty/stern placement

## 2019-07-09 NOTE — Progress Notes (Signed)
Pt surgery canceled by Dr. Estanislado Pandy. Per surgeon, he will contact patient to reschedule next surgery date and give further instructions. Pt A+O x4, pt to her significant other's car via wheelchair.

## 2019-07-09 NOTE — Progress Notes (Signed)
Patient ID: Megan Martinez, female   DOB: 1950/10/23, 69 y.o.   MRN: [id number] INR. 44 Y RT F  reently D/C from inpatient rehab following management of a Lt para pontiine ischemic infarct.. 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 Patient D/D on aspirin and plavix.  Consulted by stroke neurology for revascularization of the severe basilar artery stenosis to prevent further life threatening ischemia of the BA distribution.  Patients reports being able to walk with a walker.Able to compensate for RT UE weakness with her Lt arm. Able to tolerate solids and liquids without difficulty.  Denies any N/V,vertigo,visual blurring,blindness or diplopia.  O/E AAOx 3 speech mildly dysarthric. RT nasolabial flattening. Tongue midline. Rt UE prox 2/5 ,distally able to move fingers. RT LE Prox 3+/5 distally 3 to 4/5.   Procedure,reasons and alternatives D/W the patient. . Risks of new ischemic stroke due to perforater occlusion,with worsening neuro function,vent support,death reviewed. Patient expressed understanding and consented to the treatment.  S.Maryna Yeagle MD

## 2019-07-09 NOTE — Progress Notes (Addendum)
NIR.  Patient was scheduled for an image-guided cerebral arteriogram with revascularization of basilar artery stenosis tentatively for today with Dr. Estanislado Pandy.  Unfortunately, there was an emergent code stroke procedure requiring Dr. Arlean Hopping immediate attention at this time. Because of this, patient's procedure will not occur today and will be rescheduled for future date. Our schedulers to call patient to reschedule procedure. Informed patient of above. All questions answered and concerns addressed. Patient conveys understanding and agrees with plan.  Please call NIR with questions/concerns.   Bea Graff Carletta Feasel, PA-C 07/09/2019, 11:30 AM

## 2019-07-09 NOTE — Anesthesia Procedure Notes (Signed)
Arterial Line Insertion Start/End2/15/2021 9:00 AM, 07/09/2019 9:15 AM Performed by: CRNA  Patient location: Pre-op. Preanesthetic checklist: patient identified, IV checked, site marked, risks and benefits discussed, surgical consent, monitors and equipment checked, pre-op evaluation, timeout performed and anesthesia consent Lidocaine 1% used for infiltration and patient sedated Left, radial was placed Catheter size: 20 G Hand hygiene performed  and maximum sterile barriers used   Attempts: 3 Procedure performed without using ultrasound guided technique. Following insertion, Biopatch and dressing applied. Post procedure assessment: normal  Patient tolerated the procedure well with no immediate complications.

## 2019-07-10 ENCOUNTER — Ambulatory Visit: Payer: PPO | Admitting: Speech Pathology

## 2019-07-10 ENCOUNTER — Ambulatory Visit: Payer: PPO | Admitting: Occupational Therapy

## 2019-07-10 ENCOUNTER — Other Ambulatory Visit (HOSPITAL_COMMUNITY): Payer: Self-pay | Admitting: Diagnostic Radiology

## 2019-07-11 ENCOUNTER — Ambulatory Visit: Payer: PPO

## 2019-07-11 DIAGNOSIS — I69351 Hemiplegia and hemiparesis following cerebral infarction affecting right dominant side: Secondary | ICD-10-CM | POA: Diagnosis not present

## 2019-07-14 ENCOUNTER — Other Ambulatory Visit: Payer: Self-pay | Admitting: Family Medicine

## 2019-07-16 NOTE — Telephone Encounter (Signed)
Please advise this RX discontinued 06/20/19, last ov with Dr. Rodena Piety 8/20 for this problem.

## 2019-07-18 ENCOUNTER — Other Ambulatory Visit: Payer: Self-pay | Admitting: Radiology

## 2019-07-19 ENCOUNTER — Encounter: Payer: PPO | Attending: Physical Medicine & Rehabilitation | Admitting: Physical Medicine & Rehabilitation

## 2019-07-19 ENCOUNTER — Encounter: Payer: Self-pay | Admitting: Physical Medicine & Rehabilitation

## 2019-07-19 ENCOUNTER — Other Ambulatory Visit (HOSPITAL_COMMUNITY)
Admission: RE | Admit: 2019-07-19 | Discharge: 2019-07-19 | Disposition: A | Discharge status: Home / Self Care | Payer: PPO | Source: Ambulatory Visit | Attending: Interventional Radiology | Admitting: Interventional Radiology

## 2019-07-19 ENCOUNTER — Other Ambulatory Visit: Payer: Self-pay

## 2019-07-19 VITALS — BP 165/75 | HR 71 | Temp 98.1°F | Ht 64.0 in | Wt 159.0 lb

## 2019-07-19 DIAGNOSIS — Z20822 Contact with and (suspected) exposure to covid-19: Secondary | ICD-10-CM | POA: Insufficient documentation

## 2019-07-19 DIAGNOSIS — I771 Stricture of artery: Secondary | ICD-10-CM | POA: Diagnosis not present

## 2019-07-19 DIAGNOSIS — I10 Essential (primary) hypertension: Secondary | ICD-10-CM | POA: Diagnosis not present

## 2019-07-19 DIAGNOSIS — Z01812 Encounter for preprocedural laboratory examination: Secondary | ICD-10-CM | POA: Diagnosis not present

## 2019-07-19 DIAGNOSIS — I6350 Cerebral infarction due to unspecified occlusion or stenosis of unspecified cerebral artery: Secondary | ICD-10-CM | POA: Insufficient documentation

## 2019-07-19 DIAGNOSIS — I6322 Cerebral infarction due to unspecified occlusion or stenosis of basilar arteries: Secondary | ICD-10-CM | POA: Diagnosis not present

## 2019-07-19 DIAGNOSIS — R269 Unspecified abnormalities of gait and mobility: Secondary | ICD-10-CM | POA: Diagnosis not present

## 2019-07-19 DIAGNOSIS — I639 Cerebral infarction, unspecified: Secondary | ICD-10-CM

## 2019-07-19 LAB — SARS CORONAVIRUS 2 (TAT 6-24 HRS): SARS Coronavirus 2: NEGATIVE

## 2019-07-19 NOTE — Progress Notes (Signed)
Subjective:    Patient ID: Megan Martinez, female    DOB: 1951-04-13, 69 y.o.   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   Admit date: 06/20/2019 Discharge date: 07/05/2019  Husband supplements history.  He appears agitated and upset. At discharge, she was instructed to follow up with VIR for stenting, but due to another emergency, she states, she presented to the hospital, was worked up, but the procedure was cancelled and rescheduled for next week. She does not have an appointment with Neurology. BP remains elevated. She has not followed up with PCP. Denies swallowing difficulties. Denies falls, using rolling walker.   DME: Shower  Mobility: wheelchair/walker at home Therapies: Could not make it to [facility] for therapies  Pain Inventory Average Pain 0 Pain Right Now 0 My pain is intermittent and aching  In the last 24 hours, has pain interfered with the following? General activity 4 Relation with others 4 Enjoyment of life 0 What TIME of day is your pain at its worst? night Sleep (in general) Fair  Pain is worse with: bending and . Pain improves with: rest Relief from Meds: 0  Mobility use a walker ability to climb steps?  no do you drive?  no  Function retired  Neuro/Psych tingling trouble walking  Prior Studies Any changes since last visit?  no  Physicians involved in your care Any changes since last visit?  no   History reviewed. No pertinent family history. Social History   Socioeconomic History  . Marital status: Married    Spouse name: Not on file  . Number of children: Not on file  . Years of education: Not on file  . Highest education level: Not on file  Occupational History  . Not on file  Tobacco Use  . Smoking status: Former Smoker    Quit date: 1970    Years since quitting:  51.1  . Smokeless tobacco: Never Used  Substance and Sexual Activity  . Alcohol use: Yes    Comment: occasional  . Drug use: Never  . Sexual activity: Not on file  Other Topics Concern  . Not on file  Social History Narrative  . Not on file   Social Determinants of Health   Financial Resource Strain:   . Difficulty of Paying Living Expenses: Not on file  Food Insecurity:   . Worried About Charity fundraiser in the Last Year: Not on file  . Ran Out of Food in the Last Year: Not on file  Transportation Needs:   . Lack of Transportation (Medical): Not on file  . Lack of Transportation (Non-Medical): Not on file  Physical Activity:   . Days of Exercise per Week: Not on file  . Minutes of Exercise per Session: Not on file  Stress:   . Feeling of Stress : Not on file  Social Connections:   . Frequency of Communication with Friends and Family: Not on file  . Frequency of Social Gatherings with Friends and Family: Not on file  . Attends Religious Services: Not on file  . Active Member of Clubs or Organizations: Not on file  . Attends Archivist Meetings: Not on file  . Marital Status: Not on file   Past Surgical History:  Procedure Laterality Date  . ABDOMINAL HYSTERECTOMY    . APPENDECTOMY    . BREAST SURGERY Right  lumpectomy  . CATARACT EXTRACTION    . CHOLECYSTECTOMY    . RADIOLOGY WITH ANESTHESIA N/A 07/09/2019   Procedure: RADIOLOGY WITH ANESTHESIA STENT PLACEMENT;  Surgeon: Luanne Bras, MD;  Location: [place];  Service: Radiology;  Laterality: N/A;  . TUBAL LIGATION     Past Medical History:  Diagnosis Date  . Allergy   . Arthritis   . GERD (gastroesophageal reflux disease)   . Hypertension   . Hypothyroidism   . Pneumonia   . Stroke [facility])    right arm is "not working", speech a little slurred  . Thyroid disease    BP (!) 165/75   Pulse 71   Temp 98.1 F (36.7 C)   Ht 5\' 4"  (1.626 m)   Wt 159 lb (72.1 kg)   SpO2 94%   BMI 27.29 kg/m     Opioid Risk Score:   Fall Risk Score:  `1  Depression screen PHQ 2/9  No flowsheet data found.   Review of Systems  Constitutional: Negative.   HENT: Negative.   Eyes: Negative.   Respiratory: Negative.   Cardiovascular: Negative.   Gastrointestinal: Negative.   Endocrine: Negative.   Genitourinary: Negative.   Musculoskeletal: Positive for gait problem.  Skin: Negative.   Allergic/Immunologic: Negative.   Neurological: Positive for numbness.  Hematological: Negative.   Psychiatric/Behavioral: Negative.        Objective:   Physical Exam Constitutional: No distress .  Respiratory: Normal effort.   Psych: Normal mood.  Normal behavior. 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   1.  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               Will refer to [facility] PT/OT/SLP  Plan for stenting with VIR next week  Follow up Neurology - needs appointment  2.  Essential hypertension:   Remains elevated  Will increase Norvasc to 10mg   3. Gait abnormality  Therapies ordered  Cont walker/wheelchair for safety  Meds reviewed Referrals reviewed - Needs Neurology appointment All questions answered

## 2019-07-20 ENCOUNTER — Other Ambulatory Visit: Payer: Self-pay | Admitting: Radiology

## 2019-07-20 ENCOUNTER — Other Ambulatory Visit: Payer: Self-pay

## 2019-07-20 ENCOUNTER — Other Ambulatory Visit (HOSPITAL_COMMUNITY): Payer: Self-pay

## 2019-07-20 ENCOUNTER — Encounter (HOSPITAL_COMMUNITY): Payer: Self-pay | Admitting: Interventional Radiology

## 2019-07-20 NOTE — Progress Notes (Signed)
Spoke with pt for pre-op call. This procedure was originally scheduled earlier this month and I spoke with pt for that pre-op call. Pt states nothing has changed with her allergies, medications, medical and surgical history. Pt states she is taking her Aspirin and Plavix as instructed.  Covid test was done 07/19/19 and it is negative. Pt states she has been in quarantine since the test was done and understands that the quarantine continues until she comes to the hospital on Monday.

## 2019-07-22 ENCOUNTER — Encounter (HOSPITAL_COMMUNITY): Payer: Self-pay | Admitting: Anesthesiology

## 2019-07-23 ENCOUNTER — Ambulatory Visit (HOSPITAL_COMMUNITY): Admission: RE | Admit: 2019-07-23 | Payer: PPO | Source: Ambulatory Visit

## 2019-07-23 ENCOUNTER — Encounter (HOSPITAL_COMMUNITY): Payer: Self-pay | Admitting: Interventional Radiology

## 2019-07-23 ENCOUNTER — Other Ambulatory Visit: Payer: Self-pay

## 2019-07-23 ENCOUNTER — Ambulatory Visit (HOSPITAL_COMMUNITY)
Admission: RE | Admit: 2019-07-23 | Discharge: 2019-07-23 | Disposition: A | Discharge status: Home / Self Care | Payer: PPO | Source: Home / Self Care | Attending: Interventional Radiology | Admitting: Interventional Radiology

## 2019-07-23 ENCOUNTER — Other Ambulatory Visit: Payer: Self-pay | Admitting: Student

## 2019-07-23 ENCOUNTER — Other Ambulatory Visit (HOSPITAL_COMMUNITY): Payer: Self-pay

## 2019-07-23 ENCOUNTER — Encounter (HOSPITAL_COMMUNITY)
Admission: RE | Disposition: A | Discharge status: Home / Self Care | Payer: Self-pay | Source: Home / Self Care | Attending: Interventional Radiology

## 2019-07-23 ENCOUNTER — Inpatient Hospital Stay (HOSPITAL_COMMUNITY): Payer: PPO

## 2019-07-23 ENCOUNTER — Telehealth: Payer: Self-pay | Admitting: Physical Medicine & Rehabilitation

## 2019-07-23 DIAGNOSIS — Z91040 Latex allergy status: Secondary | ICD-10-CM | POA: Diagnosis not present

## 2019-07-23 DIAGNOSIS — Z888 Allergy status to other drugs, medicaments and biological substances status: Secondary | ICD-10-CM | POA: Diagnosis not present

## 2019-07-23 DIAGNOSIS — M1990 Unspecified osteoarthritis, unspecified site: Secondary | ICD-10-CM | POA: Diagnosis present

## 2019-07-23 DIAGNOSIS — I129 Hypertensive chronic kidney disease with stage 1 through stage 4 chronic kidney disease, or unspecified chronic kidney disease: Secondary | ICD-10-CM | POA: Diagnosis not present

## 2019-07-23 DIAGNOSIS — N182 Chronic kidney disease, stage 2 (mild): Secondary | ICD-10-CM | POA: Diagnosis not present

## 2019-07-23 DIAGNOSIS — E039 Hypothyroidism, unspecified: Secondary | ICD-10-CM | POA: Diagnosis present

## 2019-07-23 DIAGNOSIS — E781 Pure hyperglyceridemia: Secondary | ICD-10-CM | POA: Diagnosis present

## 2019-07-23 DIAGNOSIS — Z885 Allergy status to narcotic agent status: Secondary | ICD-10-CM | POA: Diagnosis not present

## 2019-07-23 DIAGNOSIS — R29810 Facial weakness: Secondary | ICD-10-CM | POA: Diagnosis present

## 2019-07-23 DIAGNOSIS — I6350 Cerebral infarction due to unspecified occlusion or stenosis of unspecified cerebral artery: Secondary | ICD-10-CM | POA: Diagnosis not present

## 2019-07-23 DIAGNOSIS — Z79899 Other long term (current) drug therapy: Secondary | ICD-10-CM | POA: Diagnosis not present

## 2019-07-23 DIAGNOSIS — I651 Occlusion and stenosis of basilar artery: Secondary | ICD-10-CM | POA: Diagnosis present

## 2019-07-23 DIAGNOSIS — I639 Cerebral infarction, unspecified: Secondary | ICD-10-CM | POA: Diagnosis not present

## 2019-07-23 DIAGNOSIS — K219 Gastro-esophageal reflux disease without esophagitis: Secondary | ICD-10-CM | POA: Diagnosis present

## 2019-07-23 DIAGNOSIS — Z87891 Personal history of nicotine dependence: Secondary | ICD-10-CM | POA: Diagnosis not present

## 2019-07-23 DIAGNOSIS — Z7902 Long term (current) use of antithrombotics/antiplatelets: Secondary | ICD-10-CM | POA: Diagnosis not present

## 2019-07-23 DIAGNOSIS — Z79890 Hormone replacement therapy (postmenopausal): Secondary | ICD-10-CM | POA: Diagnosis not present

## 2019-07-23 DIAGNOSIS — I10 Essential (primary) hypertension: Secondary | ICD-10-CM | POA: Diagnosis present

## 2019-07-23 DIAGNOSIS — I69331 Monoplegia of upper limb following cerebral infarction affecting right dominant side: Secondary | ICD-10-CM | POA: Diagnosis not present

## 2019-07-23 DIAGNOSIS — E782 Mixed hyperlipidemia: Secondary | ICD-10-CM | POA: Diagnosis not present

## 2019-07-23 DIAGNOSIS — Z7982 Long term (current) use of aspirin: Secondary | ICD-10-CM | POA: Diagnosis not present

## 2019-07-23 DIAGNOSIS — I69321 Dysphasia following cerebral infarction: Secondary | ICD-10-CM | POA: Diagnosis not present

## 2019-07-23 HISTORY — PX: RADIOLOGY WITH ANESTHESIA: SHX6223

## 2019-07-23 LAB — CBC WITH DIFFERENTIAL/PLATELET
Abs Immature Granulocytes: 0.02 10*3/uL (ref 0.00–0.07)
Basophils Absolute: 0.1 10*3/uL (ref 0.0–0.1)
Basophils Relative: 1 %
Eosinophils Absolute: 0.2 10*3/uL (ref 0.0–0.5)
Eosinophils Relative: 3 %
HCT: 41.7 % (ref 36.0–46.0)
Hemoglobin: 14 g/dL (ref 12.0–15.0)
Immature Granulocytes: 0 %
Lymphocytes Relative: 24 %
Lymphs Abs: 1.3 10*3/uL (ref 0.7–4.0)
MCH: 29.5 pg (ref 26.0–34.0)
MCHC: 33.6 g/dL (ref 30.0–36.0)
MCV: 88 fL (ref 80.0–100.0)
Monocytes Absolute: 0.4 10*3/uL (ref 0.1–1.0)
Monocytes Relative: 8 %
Neutro Abs: 3.4 10*3/uL (ref 1.7–7.7)
Neutrophils Relative %: 64 %
Platelets: 256 10*3/uL (ref 150–400)
RBC: 4.74 MIL/uL (ref 3.87–5.11)
RDW: 13.2 % (ref 11.5–15.5)
WBC: 5.3 10*3/uL (ref 4.0–10.5)
nRBC: 0 % (ref 0.0–0.2)

## 2019-07-23 LAB — PROTIME-INR
INR: 1.1 (ref 0.8–1.2)
Prothrombin Time: 14 seconds (ref 11.4–15.2)

## 2019-07-23 LAB — BASIC METABOLIC PANEL
Anion gap: 12 (ref 5–15)
BUN: 10 mg/dL (ref 8–23)
CO2: 27 mmol/L (ref 22–32)
Calcium: 9.4 mg/dL (ref 8.9–10.3)
Chloride: 105 mmol/L (ref 98–111)
Creatinine, Ser: 0.77 mg/dL (ref 0.44–1.00)
GFR calc Af Amer: >60 mL/min (ref >60)
GFR calc non Af Amer: >60 mL/min (ref >60)
Glucose, Bld: 114 mg/dL — ABNORMAL HIGH (ref 70–99)
Potassium: 2.9 mmol/L — ABNORMAL LOW (ref 3.5–5.1)
Sodium: 144 mmol/L (ref 135–145)

## 2019-07-23 LAB — URINALYSIS, COMPLETE (UACMP) WITH MICROSCOPIC
Bilirubin Urine: NEGATIVE
Glucose, UA: NEGATIVE mg/dL
Hgb urine dipstick: NEGATIVE
Ketones, ur: NEGATIVE mg/dL
Nitrite: NEGATIVE
Protein, ur: NEGATIVE mg/dL
Specific Gravity, Urine: 1.014 (ref 1.005–1.030)
pH: 6 (ref 5.0–8.0)

## 2019-07-23 LAB — PLATELET INHIBITION P2Y12: Platelet Function  P2Y12: 104 [PRU] — ABNORMAL LOW (ref 182–335)

## 2019-07-23 SURGERY — RADIOLOGY WITH ANESTHESIA
Anesthesia: General

## 2019-07-23 MED ORDER — SODIUM CHLORIDE 0.9 % IV SOLN: INTRAVENOUS | Status: DC

## 2019-07-23 MED ORDER — CLOPIDOGREL BISULFATE 75 MG PO TABS: 75.0000 mg | ORAL_TABLET | ORAL | Status: DC

## 2019-07-23 MED ORDER — VANCOMYCIN HCL 1000 MG IV SOLR: 1000.0000 mg | INTRAVENOUS | Status: DC

## 2019-07-23 MED ORDER — ASPIRIN EC 325 MG PO TBEC: 325.0000 mg | DELAYED_RELEASE_TABLET | ORAL | Status: DC

## 2019-07-23 MED ORDER — NIMODIPINE 30 MG PO CAPS: 0.0000 mg | ORAL_CAPSULE | ORAL | Status: DC

## 2019-07-23 NOTE — Progress Notes (Signed)
Patient is requesting to go home. Called and notified Aly Louk, PA-C who is at bedside.

## 2019-07-23 NOTE — Progress Notes (Signed)
Patient's procedure cancelled. IV lines removed. Pressure held for 5 mins. Pressure dressings in place. No distress noted. Patient's boyfriend, [name] called and notified. Patient discharged via wheelchair accompanied by NT.

## 2019-07-23 NOTE — Progress Notes (Signed)
NIR.  Patient was scheduled for an image-guided cerebral arteriogram with revascularization of basilar artery stenosis tentatively for today with Dr. Estanislado Pandy.  Unfortunately, there was an emergent code stroke procedure requiring Dr. Arlean Hopping immediate attention at this time. Because of this, patient's procedure will not occur today and will be rescheduled for tomorrow at 0830 (arrive at [phone]. Handout with pre-procedure information given to patient. No need to repeat pre-procedure labs prior to procedure per Dr. Estanislado Pandy. Call patient's boyfriend, [name], and explained above. All questions answered and concerns addressed. Patient conveys understanding and agrees with plan.  Please call NIR with questions/concerns.   Bea Graff Deeana Atwater, PA-C 07/23/2019, 1:38 PM

## 2019-07-23 NOTE — Anesthesia Preprocedure Evaluation (Deleted)
Anesthesia Evaluation  Patient identified by MRN, date of birth, ID band Patient awake    Reviewed: Allergy & Precautions, NPO status , Patient's Chart, lab work & pertinent test results  Airway Mallampati: II  TM Distance: >3 FB Neck ROM: Full    Dental  (+) Partial Upper   Pulmonary former smoker,    breath sounds clear to auscultation       Cardiovascular hypertension,  Rhythm:Regular Rate:Normal     Neuro/Psych    GI/Hepatic   Endo/Other    Renal/GU      Musculoskeletal   Abdominal   Peds  Hematology   Anesthesia Other Findings   Reproductive/Obstetrics                             Anesthesia Physical Anesthesia Plan  ASA: III  Anesthesia Plan: General   Post-op Pain Management:    Induction: Intravenous  PONV Risk Score and Plan: Ondansetron and Dexamethasone  Airway Management Planned: Oral ETT  Additional Equipment: Arterial line  Intra-op Plan:   Post-operative Plan:   Informed Consent: I have reviewed the patients History and Physical, chart, labs and discussed the procedure including the risks, benefits and alternatives for the proposed anesthesia with the patient or authorized representative who has indicated his/her understanding and acceptance.     Dental advisory given  Plan Discussed with: CRNA and Anesthesiologist  Anesthesia Plan Comments:         Anesthesia Quick Evaluation

## 2019-07-23 NOTE — Telephone Encounter (Signed)
Margaret with [facility] called to let us know that they can't staff this patient for Home Health.  The reason she gave me is that it could be due to insurance or they have a Cap that they can take of patients since [place].  It looks like this patient is in hospital,. Please let me know what you would like to do.  Thank you.

## 2019-07-23 NOTE — Progress Notes (Signed)
Patient procedure delayed due to emergency being tended by Dr. Estanislado Pandy. Patient informed of delay by Aly Louk, PA-C. Patient is pleasant and understanding of the situation and willing to wait until Dr. Estanislado Pandy is ready to do her procedure.

## 2019-07-23 NOTE — Telephone Encounter (Signed)
She is in the hospital today for planned carotid stenting.  Is there another home health agency we can try, such as [facility]?

## 2019-07-23 NOTE — H&P (Signed)
Chief Complaint: Patient was seen in consultation today for cerebral arteriogram with possible basilar artery angioplasty/stent placement   Referring Physician(s): Dr Lavera Guise  Supervising Physician: Luanne Bras  Patient Status: [facility] - Out-pt  History of Present Illness: Megan Martinez is a 69 y.o. female   CVA 06/14/19 CIR 1/27-2/11/21  Here today for cerebral arteriogram with possible basilar artery angioplasty/stent placement  Pt was scheduled last week for same procedure Reschedule secondary emergent Code Stroke in NIR  Actively taking Plavix 75 mg and ASA 325 daily--including this am Denies N/V Denies headache or visual changes Still with some slurred speech-- getting better according to pt Was dc'd from [facility] 07/05/19 Still right arm/hand weakness-- little use of both Rt leg weakness-- uses brace and can walk slowly - with walker most times  Follows with Dr Erlinda Hong Procedure today with Dr Estanislado Pandy    Past Medical History:  Diagnosis Date  . Allergy   . Arthritis   . GERD (gastroesophageal reflux disease)   . Hypertension   . Hypothyroidism   . Pneumonia   . Stroke [facility])    right arm is "not working", speech a little slurred  . Thyroid disease     Past Surgical History:  Procedure Laterality Date  . ABDOMINAL HYSTERECTOMY    . APPENDECTOMY    . BREAST SURGERY Right    lumpectomy  . CATARACT EXTRACTION    . CHOLECYSTECTOMY    . RADIOLOGY WITH ANESTHESIA N/A 07/09/2019   Procedure: RADIOLOGY WITH ANESTHESIA STENT PLACEMENT;  Surgeon: Luanne Bras, MD;  Location: [place];  Service: Radiology;  Laterality: N/A;  . TUBAL LIGATION      Allergies: Latex, Statins, Amoxicillin, Codeine, and Penicillin g  Medications: Prior to Admission medications   Medication Sig Start Date End Date Taking? Authorizing Provider  amLODipine (NORVASC) 5 MG tablet Take 1 tablet (5 mg total) by mouth daily. 07/06/19  Yes Angiulli, Lavon Paganini, PA-C  aspirin EC 325 MG EC  tablet Take 1 tablet (325 mg total) by mouth daily. 06/20/19  Yes Donzetta Starch, NP  atorvastatin (LIPITOR) 80 MG tablet Take 1 tablet (80 mg total) by mouth daily at 6 PM. 07/04/19  Yes Angiulli, Lavon Paganini, PA-C  clopidogrel (PLAVIX) 75 MG tablet Take 1 tablet (75 mg total) by mouth daily. 07/04/19  Yes Angiulli, Lavon Paganini, PA-C  fluticasone (FLONASE) 50 MCG/ACT nasal spray Place 2 sprays into both nostrils daily. Patient taking differently: Place 2 sprays into both nostrils daily as needed for allergies.  08/09/18  Yes [name], DO  levocetirizine (XYZAL) 5 MG tablet Take 5 mg by mouth daily.   Yes [provider]  levothyroxine (SYNTHROID) 137 MCG tablet Take 1 tablet (137 mcg total) by mouth daily at 6 (six) AM. 07/04/19  Yes Angiulli, Lavon Paganini, PA-C  pantoprazole (PROTONIX) 40 MG tablet Take 1 tablet (40 mg total) by mouth at bedtime. 07/04/19  Yes Angiulli, Lavon Paganini, PA-C  acetaminophen (TYLENOL) 325 MG tablet Take 2 tablets (650 mg total) by mouth every 4 (four) hours as needed for mild pain (or temp > 37.5 C (99.5 F)). 07/04/19   Angiulli, Lavon Paganini, PA-C     History reviewed. No pertinent family history.  Social History   Socioeconomic History  . Marital status: Married    Spouse name: Not on file  . Number of children: Not on file  . Years of education: Not on file  . Highest education level: Not on file  Occupational History  .  Not on file  Tobacco Use  . Smoking status: Former Smoker    Quit date: 1970    Years since quitting: 51.1  . Smokeless tobacco: Never Used  Substance and Sexual Activity  . Alcohol use: Yes    Comment: occasional  . Drug use: Never  . Sexual activity: Not on file  Other Topics Concern  . Not on file  Social History Narrative  . Not on file   Social Determinants of Health   Financial Resource Strain:   . Difficulty of Paying Living Expenses: Not on file  Food Insecurity:   . Worried About Charity fundraiser in the Last Year: Not on  file  . Ran Out of Food in the Last Year: Not on file  Transportation Needs:   . Lack of Transportation (Medical): Not on file  . Lack of Transportation (Non-Medical): Not on file  Physical Activity:   . Days of Exercise per Week: Not on file  . Minutes of Exercise per Session: Not on file  Stress:   . Feeling of Stress : Not on file  Social Connections:   . Frequency of Communication with Friends and Family: Not on file  . Frequency of Social Gatherings with Friends and Family: Not on file  . Attends Religious Services: Not on file  . Active Member of Clubs or Organizations: Not on file  . Attends Archivist Meetings: Not on file  . Marital Status: Not on file    Review of Systems: A 12 point ROS discussed and pertinent positives are indicated in the HPI above.  All other systems are negative.  Review of Systems  Constitutional: Negative for activity change, fatigue and fever.  HENT: Negative for tinnitus and trouble swallowing.   Eyes: Negative for visual disturbance.  Respiratory: Negative for cough and shortness of breath.   Cardiovascular: Negative for chest pain.  Gastrointestinal: Negative for abdominal pain.  Musculoskeletal: Positive for gait problem. Negative for back pain.  Neurological: Positive for speech difficulty and weakness. Negative for dizziness, tremors, seizures, syncope, facial asymmetry, light-headedness, numbness and headaches.  Psychiatric/Behavioral: Negative for behavioral problems and confusion.    Vital Signs: BP (!) 160/68   Pulse 80   Temp 98.2 F (36.8 C) (Oral)   Resp 18   Ht 5\' 4"  (1.626 m)   Wt 160 lb (72.6 kg)   SpO2 97%   BMI 27.46 kg/m   Physical Exam Vitals reviewed.  Constitutional:      Appearance: Normal appearance.  HENT:     Head: Atraumatic.     Mouth/Throat:     Mouth: Mucous membranes are moist.     Comments: Tongue midline Smile = Eyes:     Extraocular Movements: Extraocular movements intact.    Cardiovascular:     Rate and Rhythm: Normal rate.     Heart sounds: Normal heart sounds.  Pulmonary:     Effort: Pulmonary effort is normal.     Breath sounds: Normal breath sounds.  Abdominal:     Palpations: Abdomen is soft.  Musculoskeletal:     Comments: Right arm/hand with little use Able to grab with left for some support Right leg weak- wears brace and uses help to walk  Skin:    General: Skin is warm and dry.  Neurological:     Mental Status: She is alert and oriented to person, place, and time.  Psychiatric:        Behavior: Behavior normal.  Thought Content: Thought content normal.        Judgment: Judgment normal.     Imaging: DG Swallowing Func-Speech Pathology  Result Date: 06/27/2019 Objective Swallowing Evaluation: Type of Study: MBS-Modified Barium Swallow Study  Patient Details Name: [facility] MRN: [id number] Date of Birth: [birth year] Today's Date: 06/27/2019 Time: SLP Start Time : 0902 -SLP Stop Time : 0915 SLP Time Calculation (min): 13 min Past Medical History: Past Medical History: Diagnosis Date . Allergy  . Arthritis  . Hypertension  . Thyroid disease  Past Surgical History: Past Surgical History: Procedure Laterality Date . ABDOMINAL HYSTERECTOMY   . APPENDECTOMY   . CATARACT EXTRACTION   . CHOLECYSTECTOMY   . TUBAL LIGATION   HPI: 69 y.o. female with a history of hypertension admitted with right sided hemiparesis and facial asymmetry; received IV TPA.  Dx acute left paramedian pontine CVA. Pt admitted to [facility] 06/20/19.  Subjective: alert, cooporative Assessment / Plan / Recommendation CHL IP CLINICAL IMPRESSIONS 06/27/2019 Clinical Impression Pt presents with improved ability to protect her airway with thin liquids without need for use of any compensatory strategies since last MBSS 06/14/19. Pt has a hx of silent aspiration, and previous instrumental study recommended chin tuck maneuver to prevent airway intrusion with thins. However, today no penetration or  aspiration was observed throughout several trials of thin barium via cup and straw, or when consuming thin with barium pill. Pt's oral phase was only remarkable for reduced posterior propulsion of large chalky barium pill, and reduced cohesion of dual consistency thin with pill bolus. Given improvements in oropharyngeal swallow function, recommend pt continue current regular/thin diet and there is no longer a need for pt to perform chin tuck with liquids. Pt may consume small pills whole with thins, however large pills should either be broken to take with thin and/or consumed whole in applesauce (based on pt preference). SLP Visit Diagnosis Dysphagia, unspecified (R13.10) Attention and concentration deficit following -- Frontal lobe and executive function deficit following -- Impact on safety and function Mild aspiration risk   CHL IP TREATMENT RECOMMENDATION 06/15/2019 Treatment Recommendations Therapy as outlined in treatment plan below   Prognosis 06/27/2019 Prognosis for Safe Diet Advancement Good Barriers to Reach Goals -- Barriers/Prognosis Comment -- CHL IP DIET RECOMMENDATION 06/27/2019 SLP Diet Recommendations Regular solids;Thin liquid Liquid Administration via Cup;Straw Medication Administration Whole meds with liquid Compensations Minimize environmental distractions;Slow rate;Small sips/bites Postural Changes Remain semi-upright after after feeds/meals (Comment)   CHL IP OTHER RECOMMENDATIONS 06/27/2019 Recommended Consults -- Oral Care Recommendations Oral care BID Other Recommendations --   CHL IP FOLLOW UP RECOMMENDATIONS 06/18/2019 Follow up Recommendations Inpatient Rehab   CHL IP FREQUENCY AND DURATION 06/15/2019 Speech Therapy Frequency (ACUTE ONLY) min 2x/week Treatment Duration 2 weeks      CHL IP ORAL PHASE 06/27/2019 Oral Phase Impaired Oral - Pudding Teaspoon -- Oral - Pudding Cup -- Oral - Honey Teaspoon -- Oral - Honey Cup -- Oral - Nectar Teaspoon -- Oral - Nectar Cup -- Oral - Nectar Straw -- Oral -  Thin Teaspoon -- Oral - Thin Cup WFL Oral - Thin Straw WFL Oral - Puree NT Oral - Mech Soft NT Oral - Regular -- Oral - Multi-Consistency -- Oral - Pill Lingual pumping;Reduced posterior propulsion Oral Phase - Comment --  CHL IP PHARYNGEAL PHASE 06/27/2019 Pharyngeal Phase WFL Pharyngeal- Pudding Teaspoon -- Pharyngeal -- Pharyngeal- Pudding Cup -- Pharyngeal -- Pharyngeal- Honey Teaspoon -- Pharyngeal -- Pharyngeal- Honey Cup -- Pharyngeal -- Pharyngeal- Nectar Teaspoon --  Pharyngeal -- Pharyngeal- Nectar Cup -- Pharyngeal -- Pharyngeal- Nectar Straw -- Pharyngeal -- Pharyngeal- Thin Teaspoon -- Pharyngeal -- Pharyngeal- Thin Cup WFL Pharyngeal -- Pharyngeal- Thin Straw WFL Pharyngeal -- Pharyngeal- Puree NT Pharyngeal -- Pharyngeal- Mechanical Soft -- Pharyngeal -- Pharyngeal- Regular -- Pharyngeal -- Pharyngeal- Multi-consistency -- Pharyngeal -- Pharyngeal- Pill WFL Pharyngeal -- Pharyngeal Comment --  CHL IP CERVICAL ESOPHAGEAL PHASE 06/27/2019 Cervical Esophageal Phase WFL Pudding Teaspoon -- Pudding Cup -- Honey Teaspoon -- Honey Cup -- Nectar Teaspoon -- Nectar Cup -- Nectar Straw -- Thin Teaspoon -- Thin Cup -- Thin Straw -- Puree -- Mechanical Soft -- Regular -- Multi-consistency -- Pill -- Cervical Esophageal Comment -- Arbutus Leas 06/27/2019, 9:38 AM               Labs:  CBC: Recent Labs    06/21/19 0457 06/28/19 0601 07/02/19 0522 07/09/19 0734  WBC 6.9 6.3 5.4 6.4  HGB 13.9 12.9 12.3 14.5  HCT 41.2 39.2 37.5 44.0  PLT 254 236 268 301    COAGS: Recent Labs    06/14/19 0719 07/09/19 0734  INR 1.0 1.0  APTT 27  --     BMP: Recent Labs    06/25/19 0556 06/27/19 1023 07/02/19 0522 07/09/19 0734  NA 144 143 144 142  K 3.7 3.7 3.6 3.2*  CL 106 107 104 105  CO2 26 27 26 27   GLUCOSE 111* 128* 128* 124*  BUN 14 15 13 10   CALCIUM 9.4 9.2 9.1 9.4  CREATININE 0.98 0.92 0.89 0.78  GFRNONAA 59* >60 >60 >60  GFRAA >60 >60 >60 >60    LIVER FUNCTION TESTS: Recent Labs     06/14/19 0719 06/21/19 0457 06/25/19 0556 07/02/19 0522  BILITOT 0.5 0.6 0.9 0.5  AST 19 50* 25 18  ALT 19 53* 36 25  ALKPHOS 38 30* 31* 33*  PROT 6.5 6.2* 6.0* 5.9*  ALBUMIN 3.9 3.4* 3.5 3.2*    TUMOR MARKERS: No results for input(s): AFPTM, CEA, CA199, CHROMGRNA in the last 8760 hours.  Assessment and Plan:  CVA 06/14/19 Mid basilar artery stenosis For revascularization in IR today  Risks and benefits of cerebral angiogram with intervention were discussed with the patient including, but not limited to bleeding, infection, vascular injury, contrast induced renal failure, stroke or even death.  This interventional procedure involves the use of X-rays and because of the nature of the planned procedure, it is possible that we will have prolonged use of X-ray fluoroscopy.  Potential radiation risks to you include (but are not limited to) the following: - A slightly elevated risk for cancer  several years later in life. This risk is typically less than 0.5% percent. This risk is low in comparison to the normal incidence of human cancer, which is 33% for women and 50% for men according to the [place]. - Radiation induced injury can include skin redness, resembling a rash, tissue breakdown / ulcers and hair loss (which can be temporary or permanent).   The likelihood of either of these occurring depends on the difficulty of the procedure and whether you are sensitive to radiation due to previous procedures, disease, or genetic conditions.   IF your procedure requires a prolonged use of radiation, you will be notified and given written instructions for further action.  It is your responsibility to monitor the irradiated area for the 2 weeks following the procedure and to notify your physician if you are concerned that you have suffered a radiation  induced injury.    All of the patient's questions were answered, patient is agreeable to proceed. Consent signed and in  chart.  Pt is aware if intervention is performed- she will be admitted to Neuro ICU for overnight observation Plan for discharge in am She is agreeable  Thank you for this interesting consult.  I greatly enjoyed meeting ADDALYNNE HENK and look forward to participating in their care.  A copy of this report was sent to the requesting provider on this date.  Electronically Signed: Lavonia Drafts, PA-C 07/23/2019, 7:41 AM   I spent a total of    25 Minutes in face to face in clinical consultation, greater than 50% of which was counseling/coordinating care for basilar artery stenosis/intervention

## 2019-07-23 NOTE — Progress Notes (Signed)
Patient has not yet shown for procedure. Called- No answer. Left voicemail. Called Patient's significant other, Johney Maine-- no answer, unable to leave voicemail.

## 2019-07-23 NOTE — Anesthesia Preprocedure Evaluation (Addendum)
Anesthesia Evaluation  Patient identified by MRN, date of birth, ID band Patient awake    Reviewed: Allergy & Precautions, NPO status , Patient's Chart, lab work & pertinent test results  Airway Mallampati: II  TM Distance: >3 FB Neck ROM: Full    Dental  (+) Upper Dentures, Missing,    Pulmonary pneumonia, resolved, former smoker,    Pulmonary exam normal breath sounds clear to auscultation       Cardiovascular hypertension, Pt. on medications Normal cardiovascular exam Rhythm:Regular Rate:Normal  Basilar artery stenosis   Neuro/Psych Dysphagia- improved Gait abnormality Right hemiparesis CVA, Residual Symptoms negative psych ROS   GI/Hepatic GERD  Medicated and Controlled,  Endo/Other  Hypothyroidism Hypertriglyceridemia   Renal/GU Renal InsufficiencyRenal disease  negative genitourinary   Musculoskeletal  (+) Arthritis , Osteoarthritis,    Abdominal   Peds  Hematology Anticoagulant administration- Plavix last dose this am   Anesthesia Other Findings   Reproductive/Obstetrics                           Anesthesia Physical Anesthesia Plan  ASA: III  Anesthesia Plan: General   Post-op Pain Management:    Induction: Intravenous, Rapid sequence and Cricoid pressure planned  PONV Risk Score and Plan: 4 or greater and Ondansetron, Treatment may vary due to age or medical condition and Dexamethasone  Airway Management Planned: Oral ETT  Additional Equipment: Arterial line  Intra-op Plan:   Post-operative Plan: Extubation in OR  Informed Consent: I have reviewed the patients History and Physical, chart, labs and discussed the procedure including the risks, benefits and alternatives for the proposed anesthesia with the patient or authorized representative who has indicated his/her understanding and acceptance.     Dental advisory given  Plan Discussed with: CRNA and  Surgeon  Anesthesia Plan Comments:        Anesthesia Quick Evaluation

## 2019-07-24 ENCOUNTER — Encounter (HOSPITAL_COMMUNITY): Payer: Self-pay | Admitting: Interventional Radiology

## 2019-07-24 ENCOUNTER — Inpatient Hospital Stay (HOSPITAL_COMMUNITY): Payer: PPO | Admitting: Certified Registered Nurse Anesthetist

## 2019-07-24 ENCOUNTER — Ambulatory Visit (HOSPITAL_COMMUNITY)
Admission: RE | Admit: 2019-07-24 | Discharge: 2019-07-24 | Disposition: A | Discharge status: Home / Self Care | Payer: PPO | Source: Home / Self Care | Attending: Interventional Radiology | Admitting: Interventional Radiology

## 2019-07-24 ENCOUNTER — Inpatient Hospital Stay (HOSPITAL_COMMUNITY)
Admission: RE | Admit: 2019-07-24 | Discharge: 2019-07-25 | DRG: 027 | Disposition: A | Discharge status: Home Health | Payer: PPO | Attending: Interventional Radiology | Admitting: Interventional Radiology

## 2019-07-24 ENCOUNTER — Encounter (HOSPITAL_COMMUNITY)
Admission: RE | Disposition: A | Discharge status: Home / Self Care | Payer: Self-pay | Source: Home / Self Care | Attending: Interventional Radiology

## 2019-07-24 DIAGNOSIS — E781 Pure hyperglyceridemia: Secondary | ICD-10-CM | POA: Diagnosis present

## 2019-07-24 DIAGNOSIS — Z7982 Long term (current) use of aspirin: Secondary | ICD-10-CM | POA: Diagnosis not present

## 2019-07-24 DIAGNOSIS — I69321 Dysphasia following cerebral infarction: Secondary | ICD-10-CM

## 2019-07-24 DIAGNOSIS — I10 Essential (primary) hypertension: Secondary | ICD-10-CM | POA: Diagnosis present

## 2019-07-24 DIAGNOSIS — I6322 Cerebral infarction due to unspecified occlusion or stenosis of basilar arteries: Secondary | ICD-10-CM

## 2019-07-24 DIAGNOSIS — Z79899 Other long term (current) drug therapy: Secondary | ICD-10-CM

## 2019-07-24 DIAGNOSIS — I651 Occlusion and stenosis of basilar artery: Principal | ICD-10-CM | POA: Diagnosis present

## 2019-07-24 DIAGNOSIS — M1990 Unspecified osteoarthritis, unspecified site: Secondary | ICD-10-CM | POA: Diagnosis present

## 2019-07-24 DIAGNOSIS — Z7902 Long term (current) use of antithrombotics/antiplatelets: Secondary | ICD-10-CM | POA: Diagnosis not present

## 2019-07-24 DIAGNOSIS — Z885 Allergy status to narcotic agent status: Secondary | ICD-10-CM

## 2019-07-24 DIAGNOSIS — E039 Hypothyroidism, unspecified: Secondary | ICD-10-CM | POA: Diagnosis present

## 2019-07-24 DIAGNOSIS — K219 Gastro-esophageal reflux disease without esophagitis: Secondary | ICD-10-CM | POA: Diagnosis present

## 2019-07-24 DIAGNOSIS — Z91040 Latex allergy status: Secondary | ICD-10-CM | POA: Diagnosis not present

## 2019-07-24 DIAGNOSIS — Z87891 Personal history of nicotine dependence: Secondary | ICD-10-CM | POA: Diagnosis not present

## 2019-07-24 DIAGNOSIS — Z79890 Hormone replacement therapy (postmenopausal): Secondary | ICD-10-CM

## 2019-07-24 DIAGNOSIS — R29810 Facial weakness: Secondary | ICD-10-CM | POA: Diagnosis present

## 2019-07-24 DIAGNOSIS — I69331 Monoplegia of upper limb following cerebral infarction affecting right dominant side: Secondary | ICD-10-CM | POA: Diagnosis not present

## 2019-07-24 DIAGNOSIS — Z888 Allergy status to other drugs, medicaments and biological substances status: Secondary | ICD-10-CM

## 2019-07-24 DIAGNOSIS — I771 Stricture of artery: Secondary | ICD-10-CM

## 2019-07-24 DIAGNOSIS — Z8679 Personal history of other diseases of the circulatory system: Secondary | ICD-10-CM

## 2019-07-24 DIAGNOSIS — I639 Cerebral infarction, unspecified: Secondary | ICD-10-CM

## 2019-07-24 HISTORY — PX: IR ANGIO VERTEBRAL SEL SUBCLAVIAN INNOMINATE UNI L MOD SED: IMG5364

## 2019-07-24 HISTORY — PX: RADIOLOGY WITH ANESTHESIA: SHX6223

## 2019-07-24 HISTORY — PX: IR ANGIO INTRA EXTRACRAN SEL COM CAROTID INNOMINATE BILAT MOD SED: IMG5360

## 2019-07-24 HISTORY — PX: IR INTRA CRAN STENT: IMG2345

## 2019-07-24 LAB — HEPARIN LEVEL (UNFRACTIONATED): Heparin Unfractionated: 0.14 IU/mL — ABNORMAL LOW (ref 0.30–0.70)

## 2019-07-24 LAB — MRSA PCR SCREENING: MRSA by PCR: NEGATIVE

## 2019-07-24 LAB — POCT ACTIVATED CLOTTING TIME
Activated Clotting Time: 169 seconds
Activated Clotting Time: 208 seconds
Activated Clotting Time: 224 seconds

## 2019-07-24 SURGERY — RADIOLOGY WITH ANESTHESIA
Anesthesia: General

## 2019-07-24 MED ORDER — HEPARIN (PORCINE) 25000 UT/250ML-% IV SOLN
500.0000 [IU]/h | INTRAVENOUS | Status: DC
  Administered 2019-07-24: 500 [IU]/h via INTRAVENOUS

## 2019-07-24 MED ORDER — MIDAZOLAM HCL 5 MG/5ML IJ SOLN
INTRAMUSCULAR | Status: DC | PRN
  Administered 2019-07-24 (×2): 1 mg via INTRAVENOUS

## 2019-07-24 MED ORDER — AMLODIPINE BESYLATE 5 MG PO TABS
5.0000 mg | ORAL_TABLET | Freq: Every day | ORAL | Status: DC
  Administered 2019-07-25: 5 mg via ORAL
  Filled 2019-07-24: qty 1

## 2019-07-24 MED ORDER — CLEVIDIPINE BUTYRATE 0.5 MG/ML IV EMUL
0.0000 mg/h | INTRAVENOUS | Status: AC
  Administered 2019-07-24 (×2): 1 mg/h via INTRAVENOUS
  Administered 2019-07-24: 5 mg/h via INTRAVENOUS
  Filled 2019-07-24 (×2): qty 50

## 2019-07-24 MED ORDER — PROTAMINE SULFATE 10 MG/ML IV SOLN
INTRAVENOUS | Status: DC | PRN
  Administered 2019-07-24: 5 mg via INTRAVENOUS

## 2019-07-24 MED ORDER — EPTIFIBATIDE 20 MG/10ML IV SOLN
INTRAVENOUS | Status: AC
  Filled 2019-07-24: qty 10

## 2019-07-24 MED ORDER — ROCURONIUM BROMIDE 10 MG/ML (PF) SYRINGE
PREFILLED_SYRINGE | INTRAVENOUS | Status: DC | PRN
  Administered 2019-07-24: 20 mg via INTRAVENOUS
  Administered 2019-07-24: 50 mg via INTRAVENOUS
  Administered 2019-07-24: 20 mg via INTRAVENOUS

## 2019-07-24 MED ORDER — SODIUM CHLORIDE 0.9 % IV SOLN: INTRAVENOUS | Status: DC

## 2019-07-24 MED ORDER — ASPIRIN 81 MG PO CHEW: 324.0000 mg | CHEWABLE_TABLET | Freq: Every day | ORAL | Status: DC

## 2019-07-24 MED ORDER — GLYCOPYRROLATE PF 0.2 MG/ML IJ SOSY
PREFILLED_SYRINGE | INTRAMUSCULAR | Status: DC | PRN
  Administered 2019-07-24: .1 mg via INTRAVENOUS

## 2019-07-24 MED ORDER — CHLORHEXIDINE GLUCONATE CLOTH 2 % EX PADS
6.0000 | MEDICATED_PAD | Freq: Every day | CUTANEOUS | Status: DC
  Administered 2019-07-24: 6 via TOPICAL

## 2019-07-24 MED ORDER — CLOPIDOGREL BISULFATE 75 MG PO TABS
75.0000 mg | ORAL_TABLET | Freq: Every day | ORAL | Status: DC
  Administered 2019-07-25: 75 mg via ORAL
  Filled 2019-07-24: qty 1

## 2019-07-24 MED ORDER — HEPARIN (PORCINE) 25000 UT/250ML-% IV SOLN
INTRAVENOUS | Status: AC
  Filled 2019-07-24: qty 250

## 2019-07-24 MED ORDER — PHENYLEPHRINE HCL-NACL 10-0.9 MG/250ML-% IV SOLN
INTRAVENOUS | Status: DC | PRN
  Administered 2019-07-24: 20 ug/min via INTRAVENOUS

## 2019-07-24 MED ORDER — ASPIRIN 325 MG PO TABS
325.0000 mg | ORAL_TABLET | Freq: Every day | ORAL | Status: DC
  Administered 2019-07-25: 325 mg via ORAL
  Filled 2019-07-24: qty 1

## 2019-07-24 MED ORDER — ACETAMINOPHEN 325 MG PO TABS: 650.0000 mg | ORAL_TABLET | ORAL | Status: DC | PRN

## 2019-07-24 MED ORDER — NIMODIPINE 30 MG PO CAPS
0.0000 mg | ORAL_CAPSULE | ORAL | Status: AC
  Administered 2019-07-24: 60 mg via ORAL
  Filled 2019-07-24: qty 2

## 2019-07-24 MED ORDER — PHENYLEPHRINE 40 MCG/ML (10ML) SYRINGE FOR IV PUSH (FOR BLOOD PRESSURE SUPPORT)
PREFILLED_SYRINGE | INTRAVENOUS | Status: DC | PRN
  Administered 2019-07-24: 80 ug via INTRAVENOUS
  Administered 2019-07-24 (×2): 40 ug via INTRAVENOUS
  Administered 2019-07-24: 80 ug via INTRAVENOUS
  Administered 2019-07-24: 40 ug via INTRAVENOUS

## 2019-07-24 MED ORDER — IOHEXOL 300 MG/ML  SOLN
150.0000 mL | Freq: Once | INTRAMUSCULAR | Status: AC | PRN
  Administered 2019-07-24: 50 mL via INTRA_ARTERIAL

## 2019-07-24 MED ORDER — DEXAMETHASONE SODIUM PHOSPHATE 10 MG/ML IJ SOLN
INTRAMUSCULAR | Status: DC | PRN
  Administered 2019-07-24: 4 mg via INTRAVENOUS

## 2019-07-24 MED ORDER — CLEVIDIPINE BUTYRATE 0.5 MG/ML IV EMUL
INTRAVENOUS | Status: DC | PRN
  Administered 2019-07-24: 2 mg/h via INTRAVENOUS

## 2019-07-24 MED ORDER — VANCOMYCIN HCL 1000 MG IV SOLR: 1000.0000 mg | INTRAVENOUS | Status: DC

## 2019-07-24 MED ORDER — SUGAMMADEX SODIUM 200 MG/2ML IV SOLN
INTRAVENOUS | Status: DC | PRN
  Administered 2019-07-24: 200 mg via INTRAVENOUS

## 2019-07-24 MED ORDER — FENTANYL CITRATE (PF) 100 MCG/2ML IJ SOLN: 25.0000 ug | INTRAMUSCULAR | Status: DC | PRN

## 2019-07-24 MED ORDER — ACETAMINOPHEN 160 MG/5ML PO SOLN: 650.0000 mg | ORAL | Status: DC | PRN

## 2019-07-24 MED ORDER — ACETAMINOPHEN 650 MG RE SUPP: 650.0000 mg | RECTAL | Status: DC | PRN

## 2019-07-24 MED ORDER — LEVOCETIRIZINE DIHYDROCHLORIDE 5 MG PO TABS: 5.0000 mg | ORAL_TABLET | Freq: Every day | ORAL | Status: DC

## 2019-07-24 MED ORDER — HEPARIN (PORCINE) 25000 UT/250ML-% IV SOLN: 550.0000 [IU]/h | INTRAVENOUS | Status: DC

## 2019-07-24 MED ORDER — CLEVIDIPINE BUTYRATE 0.5 MG/ML IV EMUL
INTRAVENOUS | Status: AC
  Filled 2019-07-24: qty 50

## 2019-07-24 MED ORDER — IOHEXOL 300 MG/ML  SOLN
150.0000 mL | Freq: Once | INTRAMUSCULAR | Status: AC | PRN
  Administered 2019-07-24: 75 mL via INTRA_ARTERIAL

## 2019-07-24 MED ORDER — FLUTICASONE PROPIONATE 50 MCG/ACT NA SUSP: 2.0000 | Freq: Every day | NASAL | Status: DC | PRN

## 2019-07-24 MED ORDER — NITROGLYCERIN 1 MG/10 ML FOR IR/CATH LAB
INTRA_ARTERIAL | Status: AC
  Filled 2019-07-24: qty 10

## 2019-07-24 MED ORDER — VANCOMYCIN HCL IN DEXTROSE 1-5 GM/200ML-% IV SOLN
INTRAVENOUS | Status: AC
  Administered 2019-07-24: 1000 mg
  Filled 2019-07-24: qty 200

## 2019-07-24 MED ORDER — CLOPIDOGREL BISULFATE 75 MG PO TABS
75.0000 mg | ORAL_TABLET | ORAL | Status: AC
  Administered 2019-07-24: 75 mg via ORAL
  Filled 2019-07-24: qty 1

## 2019-07-24 MED ORDER — FENTANYL CITRATE (PF) 250 MCG/5ML IJ SOLN
INTRAMUSCULAR | Status: DC | PRN
  Administered 2019-07-24: 25 ug via INTRAVENOUS
  Administered 2019-07-24: 50 ug via INTRAVENOUS
  Administered 2019-07-24: 25 ug via INTRAVENOUS
  Administered 2019-07-24 (×2): 50 ug via INTRAVENOUS

## 2019-07-24 MED ORDER — PANTOPRAZOLE SODIUM 40 MG PO TBEC
40.0000 mg | DELAYED_RELEASE_TABLET | Freq: Every day | ORAL | Status: DC
  Administered 2019-07-24: 40 mg via ORAL
  Filled 2019-07-24: qty 1

## 2019-07-24 MED ORDER — ORAL CARE MOUTH RINSE
15.0000 mL | Freq: Two times a day (BID) | OROMUCOSAL | Status: DC
  Administered 2019-07-24 (×2): 15 mL via OROMUCOSAL

## 2019-07-24 MED ORDER — OXYCODONE HCL 5 MG PO TABS: 5.0000 mg | ORAL_TABLET | Freq: Once | ORAL | Status: DC | PRN

## 2019-07-24 MED ORDER — OXYCODONE HCL 5 MG/5ML PO SOLN: 5.0000 mg | Freq: Once | ORAL | Status: DC | PRN

## 2019-07-24 MED ORDER — EPHEDRINE SULFATE-NACL 50-0.9 MG/10ML-% IV SOSY
PREFILLED_SYRINGE | INTRAVENOUS | Status: DC | PRN
  Administered 2019-07-24 (×3): 2.5 mg via INTRAVENOUS

## 2019-07-24 MED ORDER — LEVOTHYROXINE SODIUM 25 MCG PO TABS
137.0000 ug | ORAL_TABLET | Freq: Every day | ORAL | Status: DC
  Administered 2019-07-25: 137 ug via ORAL
  Filled 2019-07-24: qty 1

## 2019-07-24 MED ORDER — EPTIFIBATIDE 20 MG/10ML IV SOLN
INTRAVENOUS | Status: AC | PRN
  Administered 2019-07-24 (×3): 1.5 mg

## 2019-07-24 MED ORDER — CLOPIDOGREL BISULFATE 75 MG PO TABS: 75.0000 mg | ORAL_TABLET | Freq: Every day | ORAL | Status: DC

## 2019-07-24 MED ORDER — ONDANSETRON HCL 4 MG/2ML IJ SOLN
4.0000 mg | Freq: Four times a day (QID) | INTRAMUSCULAR | Status: DC | PRN
  Administered 2019-07-24: 4 mg via INTRAVENOUS
  Filled 2019-07-24: qty 2

## 2019-07-24 MED ORDER — ONDANSETRON HCL 4 MG/2ML IJ SOLN
4.0000 mg | Freq: Four times a day (QID) | INTRAMUSCULAR | Status: AC | PRN
  Administered 2019-07-24: 4 mg via INTRAVENOUS

## 2019-07-24 MED ORDER — ONDANSETRON HCL 4 MG/2ML IJ SOLN
INTRAMUSCULAR | Status: AC
  Filled 2019-07-24: qty 2

## 2019-07-24 MED ORDER — ESMOLOL HCL 100 MG/10ML IV SOLN
INTRAVENOUS | Status: DC | PRN
  Administered 2019-07-24: 40 mg via INTRAVENOUS
  Administered 2019-07-24: 20 mg via INTRAVENOUS
  Administered 2019-07-24: 30 mg via INTRAVENOUS
  Administered 2019-07-24 (×2): 20 mg via INTRAVENOUS

## 2019-07-24 MED ORDER — HEPARIN (PORCINE) 25000 UT/250ML-% IV SOLN
550.0000 [IU]/h | INTRAVENOUS | Status: AC
  Administered 2019-07-24: 550 [IU]/h via INTRAVENOUS

## 2019-07-24 MED ORDER — IOHEXOL 300 MG/ML  SOLN
50.0000 mL | Freq: Once | INTRAMUSCULAR | Status: AC | PRN
  Administered 2019-07-24: 5 mL via INTRA_ARTERIAL

## 2019-07-24 MED ORDER — ASPIRIN EC 325 MG PO TBEC: 325.0000 mg | DELAYED_RELEASE_TABLET | ORAL | Status: DC

## 2019-07-24 MED ORDER — ATORVASTATIN CALCIUM 80 MG PO TABS: 80.0000 mg | ORAL_TABLET | Freq: Every day | ORAL | Status: DC

## 2019-07-24 MED ORDER — PROPOFOL 10 MG/ML IV BOLUS
INTRAVENOUS | Status: DC | PRN
  Administered 2019-07-24: 20 mg via INTRAVENOUS
  Administered 2019-07-24: 150 mg via INTRAVENOUS

## 2019-07-24 MED ORDER — LORATADINE 10 MG PO TABS: 10.0000 mg | ORAL_TABLET | Freq: Every day | ORAL | Status: DC

## 2019-07-24 MED ORDER — HEPARIN SODIUM (PORCINE) 1000 UNIT/ML IJ SOLN
INTRAMUSCULAR | Status: DC | PRN
  Administered 2019-07-24: 3000 [IU] via INTRAVENOUS
  Administered 2019-07-24: 1000 [IU] via INTRAVENOUS

## 2019-07-24 MED ORDER — LIDOCAINE 2% (20 MG/ML) 5 ML SYRINGE
INTRAMUSCULAR | Status: DC | PRN
  Administered 2019-07-24: 60 mg via INTRAVENOUS
  Administered 2019-07-24: 40 mg via INTRAVENOUS

## 2019-07-24 NOTE — Progress Notes (Signed)
Per Jannifer Franklin (PA), do not need repeat blood work.  Dr. Royce Macadamia notified that no labs to be redrawn.

## 2019-07-24 NOTE — Progress Notes (Signed)
[place] for heparin Indication: Post - IR procedure  Allergies  Allergen Reactions  . Latex Itching and Rash  . Statins Other (See Comments)    Myalgias, "sick"   . Amoxicillin Rash    Did it involve swelling of the face/tongue/throat, SOB, or low BP? No Did it involve sudden or severe rash/hives, skin peeling, or any reaction on the inside of your mouth or nose? Yes Did you need to seek medical attention at a hospital or doctor's office? N/A When did it last happen? Child If all above answers are "NO", may proceed with cephalosporin use.  . Codeine Nausea Only  . Penicillin G Rash    Did it involve swelling of the face/tongue/throat, SOB, or low BP? No Did it involve sudden or severe rash/hives, skin peeling, or any reaction on the inside of your mouth or nose? Yes Did you need to seek medical attention at a hospital or doctor's office? N/A When did it last happen?Child If all above answers are "NO", may proceed with cephalosporin use.    Patient Measurements: Height: 5\' 1"  (154.9 cm) Weight: 160 lb 0.9 oz (72.6 kg) IBW/kg (Calculated) : 47.8 Heparin Dosing Weight: 70kg  Vital Signs: Temp: 98.2 F (36.8 C) (03/02 2000) Temp Source: Oral (03/02 2000) BP: 115/58 (03/02 1900) Pulse Rate: 59 (03/02 1915)  Labs: Recent Labs    07/23/19 0733 07/24/19 1945  HGB 14.0  --   HCT 41.7  --   PLT 256  --   LABPROT 14.0  --   INR 1.1  --   HEPARINUNFRC  --  0.14*  CREATININE 0.77  --     Estimated Creatinine Clearance: 61.3 mL/min (by C-G formula based on SCr of 0.77 mg/dL).   Medical History: Past Medical History:  Diagnosis Date  . Allergy   . Arthritis   . GERD (gastroesophageal reflux disease)   . Hypertension   . Hypothyroidism   . Pneumonia   . Stroke [facility])    right arm is "not working", speech a little slurred  . Thyroid disease    Assessment: 13 YOF s/p cerebral arteriogram + stent placement in IR.  Not on  anticoagulation PTA. Pharmacy dosing heparin (infusion to stop at 7am on 3/3) -initial heparin level is at goal on 550 units/hr  Goal of Therapy:  Heparin level 0.1-0.25 units/ml Monitor platelets by anticoagulation protocol: Yes   Plan: -No heparin changes needed -Daily heparin level and CBC -Heparin to be off at 7am on 3/3  Hildred Laser, PharmD Clinical Pharmacist **Pharmacist phone directory can now be found on [email or web address] ([email or web address]).  Listed under [place].

## 2019-07-24 NOTE — Progress Notes (Signed)
ANTICOAGULATION CONSULT NOTE - Initial Consult  Pharmacy Consult for heparin Indication: Post - IR procedure  Allergies  Allergen Reactions  . Latex Itching and Rash  . Statins Other (See Comments)    Myalgias, "sick"   . Amoxicillin Rash    Did it involve swelling of the face/tongue/throat, SOB, or low BP? No Did it involve sudden or severe rash/hives, skin peeling, or any reaction on the inside of your mouth or nose? Yes Did you need to seek medical attention at a hospital or doctor's office? N/A When did it last happen? Child If all above answers are "NO", may proceed with cephalosporin use.  . Codeine Nausea Only  . Penicillin G Rash    Did it involve swelling of the face/tongue/throat, SOB, or low BP? No Did it involve sudden or severe rash/hives, skin peeling, or any reaction on the inside of your mouth or nose? Yes Did you need to seek medical attention at a hospital or doctor's office? N/A When did it last happen?Child If all above answers are "NO", may proceed with cephalosporin use.    Patient Measurements: Height: 5\' 4"  (162.6 cm) Weight: 159 lb (72.1 kg) IBW/kg (Calculated) : 54.7 Heparin Dosing Weight: 70kg  Vital Signs: Temp: 98 F (36.7 C) (03/02 1316) Temp Source: Oral (03/02 0641) BP: 115/87 (03/02 1309) Pulse Rate: 58 (03/02 1316)  Labs: Recent Labs    07/23/19 0733  HGB 14.0  HCT 41.7  PLT 256  LABPROT 14.0  INR 1.1  CREATININE 0.77    Estimated Creatinine Clearance: 65.6 mL/min (by C-G formula based on SCr of 0.77 mg/dL).   Medical History: Past Medical History:  Diagnosis Date  . Allergy   . Arthritis   . GERD (gastroesophageal reflux disease)   . Hypertension   . Hypothyroidism   . Pneumonia   . Stroke [facility])    right arm is "not working", speech a little slurred  . Thyroid disease    Assessment: 23 YOF s/p cerebral arteriogram + stent placement in IR.  Not on anticoagulation PTA, CBC wnl.    Goal of Therapy:  Heparin  level 0.1-0.25 units/ml Monitor platelets by anticoagulation protocol: Yes   Plan:  Heparin gtt at 550 units/hr, no bolus F/u 6 hour heparin level  Bertis Ruddy, PharmD Clinical Pharmacist Please check AMION for all [place] numbers 07/24/2019 1:45 PM

## 2019-07-24 NOTE — Procedures (Addendum)
S/P 4 vessel cerebral arteriogram followed by stent assisted angioplasty of symptomatic mid to distal basilar artery  Severe stenosis with minimal residual stenosis. Megan Lahman MD  Patient extubated. Opens eyes to command Denies any H/A or N/V. Pupils 35mm RT = LT sluggish. RT groin soft .Distal pulses palpable DP and PT bilaterally. Mild rt facial droop. Tongue midline. Moves LT arm and leg to command. Intermittently able to move RT leg off the bed. No movement in RT arm ,unchanged. S.Stefanie Hodgens MD

## 2019-07-24 NOTE — H&P (Signed)
Chief Complaint: Patient was seen in consultation today forcerebral arteriogram with possible basilar artery angioplasty/stent placement    Referring Physician(s): Dr Lavera Guise  Supervising Physician: Luanne Bras  Patient Status: [facility] - Out-pt  History of Present Illness: Megan Martinez is a 69 y.o. female   CVA 06/14/19 CIR 1/27-2/11/21  Here today for cerebral arteriogram with possible basilar artery angioplasty/stent placement  Pt was scheduled last week  And yesterday for same procedure Reschedule secondary emergent Code Stroke in NIR both times  Actively taking Plavix 75 mg and ASA 325 daily--including this am Denies N/V Denies headache or visual changes Still with some slurred speech-- getting better according to pt Was dc'd from [place] 07/05/19 Still right arm/hand weakness-- little use of both Rt leg weakness-- uses brace and can walk slowly - with walker most times  Follows with Dr Erlinda Hong Procedure today with Dr Estanislado Pandy    Past Medical History:  Diagnosis Date  . Allergy   . Arthritis   . GERD (gastroesophageal reflux disease)   . Hypertension   . Hypothyroidism   . Pneumonia   . Stroke [facility])    right arm is "not working", speech a little slurred  . Thyroid disease     Past Surgical History:  Procedure Laterality Date  . ABDOMINAL HYSTERECTOMY    . APPENDECTOMY    . BREAST SURGERY Right    lumpectomy  . CATARACT EXTRACTION    . CHOLECYSTECTOMY    . RADIOLOGY WITH ANESTHESIA N/A 07/09/2019   Procedure: RADIOLOGY WITH ANESTHESIA STENT PLACEMENT;  Surgeon: Luanne Bras, MD;  Location: [facility];  Service: Radiology;  Laterality: N/A;  . TUBAL LIGATION      Allergies: Latex, Statins, Amoxicillin, Codeine, and Penicillin g  Medications: Prior to Admission medications   Medication Sig Start Date End Date Taking? Authorizing Provider  amLODipine (NORVASC) 5 MG tablet Take 1 tablet (5 mg total) by mouth daily. 07/06/19  Yes Angiulli, Lavon Paganini,  PA-C  aspirin EC 325 MG EC tablet Take 1 tablet (325 mg total) by mouth daily. 06/20/19  Yes Donzetta Starch, NP  clopidogrel (PLAVIX) 75 MG tablet Take 1 tablet (75 mg total) by mouth daily. 07/04/19  Yes Angiulli, Lavon Paganini, PA-C  levothyroxine (SYNTHROID) 137 MCG tablet Take 1 tablet (137 mcg total) by mouth daily at 6 (six) AM. 07/04/19  Yes Angiulli, Lavon Paganini, PA-C  acetaminophen (TYLENOL) 325 MG tablet Take 2 tablets (650 mg total) by mouth every 4 (four) hours as needed for mild pain (or temp > 37.5 C (99.5 F)). 07/04/19   Angiulli, Lavon Paganini, PA-C  atorvastatin (LIPITOR) 80 MG tablet Take 1 tablet (80 mg total) by mouth daily at 6 PM. 07/04/19   Angiulli, Lavon Paganini, PA-C  fluticasone (FLONASE) 50 MCG/ACT nasal spray Place 2 sprays into both nostrils daily. Patient taking differently: Place 2 sprays into both nostrils daily as needed for allergies.  08/09/18   [name], DO  levocetirizine (XYZAL) 5 MG tablet Take 5 mg by mouth daily.    [provider]  pantoprazole (PROTONIX) 40 MG tablet Take 1 tablet (40 mg total) by mouth at bedtime. 07/04/19   Angiulli, Lavon Paganini, PA-C     History reviewed. No pertinent family history.  Social History   Socioeconomic History  . Marital status: Married    Spouse name: Not on file  . Number of children: Not on file  . Years of education: Not on file  . Highest education level: Not  on file  Occupational History  . Not on file  Tobacco Use  . Smoking status: Former Smoker    Quit date: 1970    Years since quitting: 51.2  . Smokeless tobacco: Never Used  Substance and Sexual Activity  . Alcohol use: Yes    Comment: occasional  . Drug use: Never  . Sexual activity: Not on file  Other Topics Concern  . Not on file  Social History Narrative  . Not on file   Social Determinants of Health   Financial Resource Strain:   . Difficulty of Paying Living Expenses: Not on file  Food Insecurity:   . Worried About Charity fundraiser in the  Last Year: Not on file  . Ran Out of Food in the Last Year: Not on file  Transportation Needs:   . Lack of Transportation (Medical): Not on file  . Lack of Transportation (Non-Medical): Not on file  Physical Activity:   . Days of Exercise per Week: Not on file  . Minutes of Exercise per Session: Not on file  Stress:   . Feeling of Stress : Not on file  Social Connections:   . Frequency of Communication with Friends and Family: Not on file  . Frequency of Social Gatherings with Friends and Family: Not on file  . Attends Religious Services: Not on file  . Active Member of Clubs or Organizations: Not on file  . Attends Archivist Meetings: Not on file  . Marital Status: Not on file    Review of Systems: A 12 point ROS discussed and pertinent positives are indicated in the HPI above.  All other systems are negative.  Review of Systems  Constitutional: Negative for activity change, fatigue and fever.  HENT: Negative for tinnitus and trouble swallowing.   Eyes: Negative for visual disturbance.  Respiratory: Negative for cough and shortness of breath.   Cardiovascular: Negative for chest pain.  Gastrointestinal: Negative for abdominal pain.  Musculoskeletal: Positive for gait problem. Negative for back pain.  Neurological: Positive for speech difficulty and weakness. Negative for dizziness, tremors, seizures, syncope, facial asymmetry, light-headedness, numbness and headaches.  Psychiatric/Behavioral: Negative for behavioral problems and confusion.   Vital Signs: BP (!) 156/64   Pulse 63   Temp 97.6 F (36.4 C) (Oral)   Resp 18   Ht 5\' 4"  (1.626 m)   Wt 159 lb (72.1 kg)   SpO2 95%   BMI 27.29 kg/m   Physical Exam Vitals reviewed.  Constitutional:      Appearance: Normal appearance.  HENT:     Head: Atraumatic.     Mouth/Throat:     Mouth: Mucous membranes are moist.     Comments: Tongue midline Smile = Eyes:     Extraocular Movements: Extraocular movements  intact.  Cardiovascular:     Rate and Rhythm: Normal rate.     Heart sounds: Normal heart sounds.  Pulmonary:     Effort: Pulmonary effort is normal.     Breath sounds: Normal breath sounds.  Abdominal:     Palpations: Abdomen is soft.  Musculoskeletal:     Comments: Right arm/hand with little use Able to grab with left for some support Right leg weak- wears brace and uses help to walk  Skin:    General: Skin is warm and dry.  Neurological:     Mental Status: She is alert and oriented to person, place, and time.  Psychiatric:        Behavior: Behavior normal.  Thought Content: Thought content normal.        Judgment: Judgment normal.      Imaging: DG Swallowing Func-Speech Pathology  Result Date: 06/27/2019 Objective Swallowing Evaluation: Type of Study: MBS-Modified Barium Swallow Study  Patient Details Name: Megan Martinez MRN: [id number] Date of Birth: 1950-11-21 Today's Date: 06/27/2019 Time: SLP Start Time : 0902 -SLP Stop Time : 0915 SLP Time Calculation (min): 13 min Past Medical History: Past Medical History: Diagnosis Date . Allergy  . Arthritis  . Hypertension  . Thyroid disease  Past Surgical History: Past Surgical History: Procedure Laterality Date . ABDOMINAL HYSTERECTOMY   . APPENDECTOMY   . CATARACT EXTRACTION   . CHOLECYSTECTOMY   . TUBAL LIGATION   HPI: 69 y.o. female with a history of hypertension admitted with right sided hemiparesis and facial asymmetry; received IV TPA.  Dx acute left paramedian pontine CVA. Pt admitted to [facility] 06/20/19.  Subjective: alert, cooporative Assessment / Plan / Recommendation CHL IP CLINICAL IMPRESSIONS 06/27/2019 Clinical Impression Pt presents with improved ability to protect her airway with thin liquids without need for use of any compensatory strategies since last MBSS 06/14/19. Pt has a hx of silent aspiration, and previous instrumental study recommended chin tuck maneuver to prevent airway intrusion with thins. However, today no  penetration or aspiration was observed throughout several trials of thin barium via cup and straw, or when consuming thin with barium pill. Pt's oral phase was only remarkable for reduced posterior propulsion of large chalky barium pill, and reduced cohesion of dual consistency thin with pill bolus. Given improvements in oropharyngeal swallow function, recommend pt continue current regular/thin diet and there is no longer a need for pt to perform chin tuck with liquids. Pt may consume small pills whole with thins, however large pills should either be broken to take with thin and/or consumed whole in applesauce (based on pt preference). SLP Visit Diagnosis Dysphagia, unspecified (R13.10) Attention and concentration deficit following -- Frontal lobe and executive function deficit following -- Impact on safety and function Mild aspiration risk   CHL IP TREATMENT RECOMMENDATION 06/15/2019 Treatment Recommendations Therapy as outlined in treatment plan below   Prognosis 06/27/2019 Prognosis for Safe Diet Advancement Good Barriers to Reach Goals -- Barriers/Prognosis Comment -- CHL IP DIET RECOMMENDATION 06/27/2019 SLP Diet Recommendations Regular solids;Thin liquid Liquid Administration via Cup;Straw Medication Administration Whole meds with liquid Compensations Minimize environmental distractions;Slow rate;Small sips/bites Postural Changes Remain semi-upright after after feeds/meals (Comment)   CHL IP OTHER RECOMMENDATIONS 06/27/2019 Recommended Consults -- Oral Care Recommendations Oral care BID Other Recommendations --   CHL IP FOLLOW UP RECOMMENDATIONS 06/18/2019 Follow up Recommendations Inpatient Rehab   CHL IP FREQUENCY AND DURATION 06/15/2019 Speech Therapy Frequency (ACUTE ONLY) min 2x/week Treatment Duration 2 weeks      CHL IP ORAL PHASE 06/27/2019 Oral Phase Impaired Oral - Pudding Teaspoon -- Oral - Pudding Cup -- Oral - Honey Teaspoon -- Oral - Honey Cup -- Oral - Nectar Teaspoon -- Oral - Nectar Cup -- Oral - Nectar  Straw -- Oral - Thin Teaspoon -- Oral - Thin Cup WFL Oral - Thin Straw WFL Oral - Puree NT Oral - Mech Soft NT Oral - Regular -- Oral - Multi-Consistency -- Oral - Pill Lingual pumping;Reduced posterior propulsion Oral Phase - Comment --  CHL IP PHARYNGEAL PHASE 06/27/2019 Pharyngeal Phase WFL Pharyngeal- Pudding Teaspoon -- Pharyngeal -- Pharyngeal- Pudding Cup -- Pharyngeal -- Pharyngeal- Honey Teaspoon -- Pharyngeal -- Pharyngeal- Honey Cup -- Pharyngeal -- Pharyngeal- Nectar  Teaspoon -- Pharyngeal -- Pharyngeal- Nectar Cup -- Pharyngeal -- Pharyngeal- Nectar Straw -- Pharyngeal -- Pharyngeal- Thin Teaspoon -- Pharyngeal -- Pharyngeal- Thin Cup WFL Pharyngeal -- Pharyngeal- Thin Straw WFL Pharyngeal -- Pharyngeal- Puree NT Pharyngeal -- Pharyngeal- Mechanical Soft -- Pharyngeal -- Pharyngeal- Regular -- Pharyngeal -- Pharyngeal- Multi-consistency -- Pharyngeal -- Pharyngeal- Pill WFL Pharyngeal -- Pharyngeal Comment --  CHL IP CERVICAL ESOPHAGEAL PHASE 06/27/2019 Cervical Esophageal Phase WFL Pudding Teaspoon -- Pudding Cup -- Honey Teaspoon -- Honey Cup -- Nectar Teaspoon -- Nectar Cup -- Nectar Straw -- Thin Teaspoon -- Thin Cup -- Thin Straw -- Puree -- Mechanical Soft -- Regular -- Multi-consistency -- Pill -- Cervical Esophageal Comment -- Arbutus Leas 06/27/2019, 9:38 AM               Labs:  CBC: Recent Labs    06/28/19 0601 07/02/19 0522 07/09/19 0734 07/23/19 0733  WBC 6.3 5.4 6.4 5.3  HGB 12.9 12.3 14.5 14.0  HCT 39.2 37.5 44.0 41.7  PLT 236 268 301 256    COAGS: Recent Labs    06/14/19 0719 07/09/19 0734 07/23/19 0733  INR 1.0 1.0 1.1  APTT 27  --   --     BMP: Recent Labs    06/27/19 1023 07/02/19 0522 07/09/19 0734 07/23/19 0733  NA 143 144 142 144  K 3.7 3.6 3.2* 2.9*  CL 107 104 105 105  CO2 27 26 27 27   GLUCOSE 128* 128* 124* 114*  BUN 15 13 10 10   CALCIUM 9.2 9.1 9.4 9.4  CREATININE 0.92 0.89 0.78 0.77  GFRNONAA >60 >60 >60 >60  GFRAA >60 >60 >60 >60     LIVER FUNCTION TESTS: Recent Labs    06/14/19 0719 06/21/19 0457 06/25/19 0556 07/02/19 0522  BILITOT 0.5 0.6 0.9 0.5  AST 19 50* 25 18  ALT 19 53* 36 25  ALKPHOS 38 30* 31* 33*  PROT 6.5 6.2* 6.0* 5.9*  ALBUMIN 3.9 3.4* 3.5 3.2*    TUMOR MARKERS: No results for input(s): AFPTM, CEA, CA199, CHROMGRNA in the last 8760 hours.  Assessment and Plan:  CVA 06/14/19 Mid basilar artery stenosis For revascularization in IR today  Risks and benefits of cerebral angiogram with intervention were discussed with the patient including, but not limited to bleeding, infection, vascular injury, contrast induced renal failure, stroke or even death.  This interventional procedure involves the use of X-rays and because of the nature of the planned procedure, it is possible that we will have prolonged use of X-ray fluoroscopy.  Potential radiation risks to you include (but are not limited to) the following: - A slightly elevated risk for cancer        several years later in life. This risk is typically less than 0.5% percent. This risk is low in comparison to the normal incidence of human cancer, which is 33% for women and 50% for men according to the [place]. - Radiation induced injury can include skin redness, resembling a rash, tissue breakdown / ulcers and hair loss (which can be temporary or permanent).   The likelihood of either of these occurring depends on the difficulty of the procedure and whether you are sensitive to radiation due to previous procedures, disease, or genetic conditions.   IF your procedure requires a prolonged use of radiation, you will be notified and given written instructions for further action.  It is your responsibility to monitor the irradiated area for the 2 weeks following the procedure and  to notify your physician if you are concerned that you have suffered a radiation induced injury.    All of the patient's questions were answered,  patient is agreeable to proceed. Consent signed and in chart.  Pt is aware if intervention is performed- she will be admitted to Neuro ICU for overnight observation Plan for discharge in am She is agreeable  Thank you for this interesting consult.  I greatly enjoyed meeting Megan Martinez and look forward to participating in their care.  A copy of this report was sent to the requesting provider on this date.  Thank you for this interesting consult.  I greatly enjoyed meeting Megan Martinez and look forward to participating in their care.  A copy of this report was sent to the requesting provider on this date.  Electronically Signed: Lavonia Drafts, PA-C 07/24/2019, 7:24 AM   I spent a total of    15 Minutes in face to face in clinical consultation, greater than 50% of which was counseling/coordinating care for basilar artery stenosis/intervention

## 2019-07-24 NOTE — Anesthesia Postprocedure Evaluation (Signed)
Anesthesia Post Note  Patient: Megan Martinez  Procedure(s) Performed: RADIOLOGY WITH ANESTHESIA STENT PLACEMENT (N/A )     Patient location during evaluation: PACU Anesthesia Type: General Level of consciousness: awake and alert and oriented Pain management: pain level controlled Vital Signs Assessment: post-procedure vital signs reviewed and stable Respiratory status: spontaneous breathing, nonlabored ventilation, respiratory function stable and patient connected to nasal cannula oxygen Cardiovascular status: blood pressure returned to baseline and stable Postop Assessment: no apparent nausea or vomiting Anesthetic complications: no    Last Vitals:  Vitals:   07/24/19 1245 07/24/19 1254  BP:  108/61  Pulse: (!) 55 65  Resp: 15 18  Temp:    SpO2: 100% 99%    Last Pain:  Vitals:   07/24/19 1300  TempSrc:   PainSc: 0-No pain    LLE Motor Response: Purposeful movement (07/24/19 1300)   RLE Motor Response: Purposeful movement (07/24/19 1300)        [name]

## 2019-07-24 NOTE — Progress Notes (Addendum)
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 Estanislado Pandy.  Patient evaluated bedside alongside Dr. Estanislado Pandy in neuro ICU. Patient awake and alert laying in bed with no complaints at this time. Denies headache, new/change to weakness (has known right-sided weakness prior to procedure- in fact patient states right-sided weakness has improved since procedure), numbness/tingling, dizziness, vision changes, hearing changes, tinnitus, or new/change to speech difficulty (has known dysarthria from prior to procedure).  Alert, awake, and oriented x3. Speech dysarthric (no change from prior to procedure), can follow simple commands and answer questions appropriately. PERRL bilaterally. No facial asymmetry. Can spontaneously move left side, can bend right elbow and can barely lift RUE off bed but cannot hold, can raise RLE off bed but cannot hold. Distal pulses 1+ bilaterally (DPs). Right groin incision soft without active bleeding or hematoma.  Plan to stay in neuro ICU for overnight observation. Raise HOB at 2000 unless oozing from groin occurs. SCDs as VTE prophylaxis. Continue taking Plavix 75 mg once daily and Aspirin 325 mg once daily- will obtain P2Y12 tomorrow AM to assess effectiveness of DAPT. Plan for PT/OT/SPL consult tomorrow- appreciate assistance with this patient. NIR to follow.   Bea Graff Veria Stradley, PA-C 07/24/2019, 3:36 PM

## 2019-07-24 NOTE — Anesthesia Procedure Notes (Signed)
Arterial Line Insertion Performed by: Josephine Igo, MD, anesthesiologist  Patient location: Pre-op. Preanesthetic checklist: patient identified, IV checked, site marked, risks and benefits discussed, surgical consent, monitors and equipment checked, pre-op evaluation, timeout performed and anesthesia consent Patient sedated Left, radial was placed Catheter size: 20 Fr Hand hygiene performed  and Seldinger technique used  Attempts: 1 Procedure performed without using ultrasound guided technique. Following insertion, dressing applied and Biopatch. Post procedure assessment: normal and unchanged  Patient tolerated the procedure well with no immediate complications.

## 2019-07-24 NOTE — Anesthesia Procedure Notes (Signed)
Procedure Name: Intubation Performed by: Milford Cage, CRNA Pre-anesthesia Checklist: Patient identified, Emergency Drugs available, Suction available and Patient being monitored Patient Re-evaluated:Patient Re-evaluated prior to induction Oxygen Delivery Method: Circle System Utilized Preoxygenation: Pre-oxygenation with 100% oxygen Induction Type: IV induction Ventilation: Mask ventilation without difficulty Laryngoscope Size: Mac and 3 Grade View: Grade II Tube type: Oral Tube size: 7.5 mm Number of attempts: 1 Airway Equipment and Method: Stylet Placement Confirmation: ETT inserted through vocal cords under direct vision,  positive ETCO2 and breath sounds checked- equal and bilateral Secured at: 21 cm Tube secured with: Tape Dental Injury: Teeth and Oropharynx as per pre-operative assessment

## 2019-07-24 NOTE — Transfer of Care (Signed)
Immediate Anesthesia Transfer of Care Note  Patient: Megan Martinez  Procedure(s) Performed: RADIOLOGY WITH ANESTHESIA STENT PLACEMENT (N/A )  Patient Location: PACU  Anesthesia Type:General  Level of Consciousness: awake  Airway & Oxygen Therapy: Patient Spontanous Breathing and Patient connected to face mask oxygen  Post-op Assessment: Report given to RN, Post -op Vital signs reviewed and stable and Patient able to stick tongue midline  Post vital signs: Reviewed and stable  Last Vitals:  Vitals Value Taken Time  BP 116/84 07/24/19 1209  Temp    Pulse 65 07/24/19 1213  Resp 19 07/24/19 1213  SpO2 100 % 07/24/19 1213  Vitals shown include unvalidated device data.  Last Pain:  Vitals:   07/24/19 0724  TempSrc:   PainSc: 0-No pain         Complications: No apparent anesthesia complications

## 2019-07-24 NOTE — Progress Notes (Signed)
RN updated Dr. Estanislado Pandy on patient current status. Pt. Groin site and vital signs stable. Pt. Neuro intact. Will titrate cleviprex gtt down per cuff pressure to obtain systolic closer to 0000000. RN will continue to monitor.

## 2019-07-24 NOTE — Plan of Care (Signed)
  Problem: Clinical Measurements: Goal: Will remain free from infection Outcome: Progressing Goal: Respiratory complications will improve Outcome: Progressing Goal: Cardiovascular complication will be avoided Outcome: Progressing   Problem: Pain Managment: Goal: General experience of comfort will improve Outcome: Progressing   Problem: Safety: Goal: Ability to remain free from injury will improve Outcome: Progressing   Problem: Skin Integrity: Goal: Risk for impaired skin integrity will decrease Outcome: Progressing   Problem: Cardiovascular: Goal: Vascular access site(s) Level 0-1 will be maintained Outcome: Progressing

## 2019-07-25 ENCOUNTER — Encounter: Payer: Self-pay | Admitting: *Deleted

## 2019-07-25 LAB — BASIC METABOLIC PANEL
Anion gap: 9 (ref 5–15)
Anion gap: 9 (ref 5–15)
BUN: 7 mg/dL — ABNORMAL LOW (ref 8–23)
BUN: 8 mg/dL (ref 8–23)
CO2: 25 mmol/L (ref 22–32)
CO2: 25 mmol/L (ref 22–32)
Calcium: 8.5 mg/dL — ABNORMAL LOW (ref 8.9–10.3)
Calcium: 8.9 mg/dL (ref 8.9–10.3)
Chloride: 106 mmol/L (ref 98–111)
Chloride: 107 mmol/L (ref 98–111)
Creatinine, Ser: 0.64 mg/dL (ref 0.44–1.00)
Creatinine, Ser: 0.9 mg/dL (ref 0.44–1.00)
GFR calc Af Amer: >60 mL/min (ref >60)
GFR calc Af Amer: >60 mL/min (ref >60)
GFR calc non Af Amer: >60 mL/min (ref >60)
GFR calc non Af Amer: >60 mL/min (ref >60)
Glucose, Bld: 105 mg/dL — ABNORMAL HIGH (ref 70–99)
Glucose, Bld: 129 mg/dL — ABNORMAL HIGH (ref 70–99)
Potassium: 2.7 mmol/L — CL (ref 3.5–5.1)
Potassium: 2.9 mmol/L — ABNORMAL LOW (ref 3.5–5.1)
Sodium: 140 mmol/L (ref 135–145)
Sodium: 141 mmol/L (ref 135–145)

## 2019-07-25 LAB — POTASSIUM: Potassium: 3 mmol/L — ABNORMAL LOW (ref 3.5–5.1)

## 2019-07-25 LAB — CBC WITH DIFFERENTIAL/PLATELET
Abs Immature Granulocytes: 0.03 10*3/uL (ref 0.00–0.07)
Basophils Absolute: 0 10*3/uL (ref 0.0–0.1)
Basophils Relative: 0 %
Eosinophils Absolute: 0 10*3/uL (ref 0.0–0.5)
Eosinophils Relative: 0 %
HCT: 33 % — ABNORMAL LOW (ref 36.0–46.0)
Hemoglobin: 11.2 g/dL — ABNORMAL LOW (ref 12.0–15.0)
Immature Granulocytes: 0 %
Lymphocytes Relative: 20 %
Lymphs Abs: 1.6 10*3/uL (ref 0.7–4.0)
MCH: 29.2 pg (ref 26.0–34.0)
MCHC: 33.9 g/dL (ref 30.0–36.0)
MCV: 86.2 fL (ref 80.0–100.0)
Monocytes Absolute: 0.5 10*3/uL (ref 0.1–1.0)
Monocytes Relative: 7 %
Neutro Abs: 6.1 10*3/uL (ref 1.7–7.7)
Neutrophils Relative %: 73 %
Platelets: 245 10*3/uL (ref 150–400)
RBC: 3.83 MIL/uL — ABNORMAL LOW (ref 3.87–5.11)
RDW: 13.3 % (ref 11.5–15.5)
WBC: 8.3 10*3/uL (ref 4.0–10.5)
nRBC: 0 % (ref 0.0–0.2)

## 2019-07-25 LAB — PLATELET INHIBITION P2Y12: Platelet Function  P2Y12: 195 [PRU] (ref 182–335)

## 2019-07-25 MED ORDER — POTASSIUM CHLORIDE CRYS ER 20 MEQ PO TBCR
40.0000 meq | EXTENDED_RELEASE_TABLET | Freq: Once | ORAL | Status: AC
  Administered 2019-07-25: 40 meq via ORAL
  Filled 2019-07-25: qty 2

## 2019-07-25 MED ORDER — POTASSIUM CHLORIDE CRYS ER 20 MEQ PO TBCR
20.0000 meq | EXTENDED_RELEASE_TABLET | Freq: Once | ORAL | Status: AC
  Administered 2019-07-25: 20 meq via ORAL
  Filled 2019-07-25: qty 1

## 2019-07-25 MED ORDER — CLOPIDOGREL BISULFATE 75 MG PO TABS
75.0000 mg | ORAL_TABLET | Freq: Once | ORAL | Status: AC
  Administered 2019-07-25: 75 mg via ORAL
  Filled 2019-07-25: qty 1

## 2019-07-25 NOTE — Evaluation (Signed)
Occupational Therapy Evaluation Patient Details Name: Megan Martinez MRN: [id number] DOB: 30-Aug-1950 Today's Date: 07/25/2019    History of Present Illness Patient is a 68 y/o female with recent history of nonhemorrhagic left paramedian pontine ischemic infarction. She underwent inpatient rehab and has residual R side weakness and dysarthria and reports STM deficits.  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   Clinical Impression   This 69 y/o female presents with the above. Pt with recent admit, CIR stay and discharge home. Pt reports completing mobility and majority of ADL tasks at mod independent level, receiving assist from daughter for bathing and intermittent supervision/assist from both daughter and boyfriend for additional ADL/iADL needs. Pt very pleasant and eager to participate in therapy session today. Pt completing functional mobility using RW, toileting, standing grooming and LB ADL with minguard-minA throughout. Provided support for RUE on RW as pt typically uses walker splint at home. Pt reports feeling mildly slower with completing ADL/mobility tasks compared to her recent baseline. Reports daughter/boyfriend could stay initially after discharge if needed as well. She will benefit from continued acute OT services, currently recommend follow up [facility] services after discharge to maximize her safety and independence with ADL and mobility. Will follow.     Follow Up Recommendations  Home health OT;Supervision/Assistance - 24 hour(24hr initially)    Equipment Recommendations  None recommended by OT           Precautions / Restrictions Precautions Precautions: Fall Precaution Comments: right hemiparesis Required Braces or Orthoses: Other Brace Other Brace: R AFO Restrictions Weight Bearing Restrictions: No      Mobility Bed Mobility Overal bed mobility: Needs Assistance Bed  Mobility: Supine to Sit     Supine to sit: Min guard;HOB elevated     General bed mobility comments: to initiate R LE off bed  Transfers Overall transfer level: Needs assistance Equipment used: Rolling walker (2 wheeled)(R AFO) Transfers: Sit to/from Stand Sit to Stand: Min guard         General transfer comment: for safety and balance    Balance Overall balance assessment: Needs assistance   Sitting balance-Leahy Scale: Good     Standing balance support: No upper extremity supported Standing balance-Leahy Scale: Fair Standing balance comment: can stand without UE support briefly                           ADL either performed or assessed with clinical judgement   ADL Overall ADL's : Needs assistance/impaired Eating/Feeding: Set up;Sitting   Grooming: Wash/dry hands;Standing;Min guard Grooming Details (indicate cue type and reason): washing L hand Upper Body Bathing: Minimal assistance;Sitting   Lower Body Bathing: Min guard;Sit to/from stand   Upper Body Dressing : Min guard;Set up;Sitting   Lower Body Dressing: Minimal assistance;Sit to/from stand Lower Body Dressing Details (indicate cue type and reason): some assist for R AFO/shoe given thicker hospital socks. Pt able to don bil socks and L shoe Toilet Transfer: Minimal assistance;Ambulation;RW Toilet Transfer Details (indicate cue type and reason): assist only to maintain R hand on RW as she currently doesn't have her walker splint with her  Toileting- Clothing Manipulation and Hygiene: Minimal assistance;Sit to/from stand Toileting - Clothing Manipulation Details (indicate cue type and reason): assist for gown management, pt completing pericare and clothing management (mesh underwear) without assist, increased time/effort     Functional mobility during ADLs: Minimal assistance;Rolling walker  Vision Baseline Vision/History: Wears glasses Wears Glasses: Reading only Additional Comments: pt  reports no change in vision since recent discharge home     Perception     Praxis      Pertinent Vitals/Pain Pain Assessment: No/denies pain     Hand Dominance Right   Extremity/Trunk Assessment Upper Extremity Assessment Upper Extremity Assessment: RUE deficits/detail RUE Deficits / Details: grossly 2/5, pt with poor digit extension and wrist flexion/extension (1/5) RUE Sensation: (denies sensation changes) RUE Coordination: decreased fine motor;decreased gross motor   Lower Extremity Assessment Lower Extremity Assessment: Defer to PT evaluation RLE Deficits / Details: AAROM WFL, strength hip flexion 3+/5, knee extension 4/5, ankle DF 2/5 RLE Coordination: decreased gross motor;decreased fine motor       Communication Communication Communication: Expressive difficulties(dysarthria)   Cognition Arousal/Alertness: Awake/alert Behavior During Therapy: WFL for tasks assessed/performed Overall Cognitive Status: History of cognitive impairments - at baseline                                 General Comments: reports memory deficits since stroke   General Comments  VSS, no c/o dizziness    Exercises     Shoulder Instructions      Home Living Family/patient expects to be discharged to:: Private residence Living Arrangements: Non-relatives/Friends Available Help at Discharge: Family;Friend(s);Available PRN/intermittently Type of Home: House Home Access: Stairs to enter Entrance Stairs-Number of Steps: 2 small steps Entrance Stairs-Rails: None Home Layout: Two level;Laundry or work area in Building surveyor of Steps: doesn't go down stairs   Bathroom Shower/Tub: Tub/shower unit;Walk-in Psychologist, prison and probation services: Standard     Home Equipment: Shower seat;Wheelchair - [employer] - 2 wheels(walker splint for RUE)   Additional Comments: boyfriend of 11 years lives next door; [place], daughter, is Radiation protection practitioner and unemployed  Lives With:  Alone    Prior Functioning/Environment Level of Independence: Needs assistance  Gait / Transfers Assistance Needed: walking with walker, R LE brace on her own or in w/c ADL's / Homemaking Assistance Needed: daugter helps with showering Communication / Swallowing Assistance Needed: regular diet          OT Problem List: Decreased strength;Decreased range of motion;Decreased activity tolerance;Impaired balance (sitting and/or standing);Decreased coordination;Decreased cognition;Decreased safety awareness;Decreased knowledge of use of DME or AE;Impaired UE functional use      OT Treatment/Interventions: Self-care/ADL training;Therapeutic exercise;Energy conservation;DME and/or AE instruction;Therapeutic activities;Patient/family education;Balance training;Cognitive remediation/compensation;Visual/perceptual remediation/compensation;Neuromuscular education;Splinting    OT Goals(Current goals can be found in the care plan section) Acute Rehab OT Goals Patient Stated Goal: to go home OT Goal Formulation: With patient Time For Goal Achievement: 08/08/19 Potential to Achieve Goals: Good  OT Frequency: Min 2X/week   Barriers to D/C:            Co-evaluation PT/OT/SLP Co-Evaluation/Treatment: Yes Reason for Co-Treatment: For patient/therapist safety;To address functional/ADL transfers PT goals addressed during session: Mobility/safety with mobility;Balance;Proper use of DME OT goals addressed during session: ADL's and self-care      AM-PAC OT "6 Clicks" Daily Activity     Outcome Measure Help from another person eating meals?: A Little Help from another person taking care of personal grooming?: A Little Help from another person toileting, which includes using toliet, bedpan, or urinal?: A Little Help from another person bathing (including washing, rinsing, drying)?: A Little Help from another person to put on and taking off regular upper body clothing?: None Help from another person  to put on and taking off regular lower body clothing?: A Little 6 Click Score: 19   End of Session Equipment Utilized During Treatment: Rolling walker;Other (comment)(R AFO) Nurse Communication: Mobility status  Activity Tolerance: Patient tolerated treatment well Patient left: in chair;with call bell/phone within reach;with chair alarm set  OT Visit Diagnosis: Hemiplegia and hemiparesis;Other abnormalities of gait and mobility (R26.89) Hemiplegia - Right/Left: Right Hemiplegia - dominant/non-dominant: Dominant Hemiplegia - caused by: Cerebral infarction                [id number] OT Time Calculation (min): 35 min Charges:  OT General Charges $OT Visit: 1 Visit OT Evaluation $OT Eval Moderate Complexity: 1 Mod  Lou Cal, OT Acute Rehabilitation Services Pager [phone] Office [phone]   Raymondo Band 07/25/2019, 3:49 PM

## 2019-07-25 NOTE — TOC Transition Note (Signed)
Transition of Care [facility]) - CM/SW Discharge Note   Patient Details  Name: Megan Martinez MRN: [id number] Date of Birth: 12-Feb-1951  Transition [facility]) CM/SW Contact:  Ella Bodo, RN Phone Number: 07/25/2019, 4:19 PM   Clinical Narrative:    Patient is a 69 y/o female with recent history of nonhemorrhagic left paramedian pontine ischemic infarction. She underwent inpatient rehab and has residual R side weakness and dysarthria and reports STM deficits.  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   PTA, pt needs assistance with ADLS, lives alone.  Her boyfriend and daughter live next door, per pt, and can provide intermittent assistance.  PT/OT and ST recommending HH follow up, and pt agreeable to services.  Referral to [facility], per pt choice.  No DME needs, per therapies.      Final next level of care: [place] Barriers to Discharge: Barriers Resolved   Patient Goals and CMS Choice Patient states their goals for this hospitalization and ongoing recovery are:: to talk better CMS Medicare.gov Compare Post Acute Care list provided to:: Patient Choice offered to / list presented to : Patient  Discharge Placement                       Discharge Plan and Services   Discharge Planning Services: CM Consult Post Acute Care Choice: Home Health                    HH Arranged: PT, OT, Speech Therapy HH Agency: [facility] Date [facility] Agency Contacted: 07/25/19 Time [place]: 1618 Representative spoke with at [place]: [place] Interventions     Readmission Risk Interventions Readmission Risk Prevention Plan 07/25/2019  Transportation Screening Complete  PCP or Specialist Appt within 5-7 Days Complete  Home Care Screening Complete  Medication Review (RN CM) Complete  Some  recent data might be hidden   Reinaldo Raddle, RN, BSN  Trauma/Neuro ICU Case Manager [phone]

## 2019-07-25 NOTE — Evaluation (Signed)
Physical Therapy Evaluation Patient Details Name: Megan Martinez MRN: [id number] DOB: [birth year] Today's Date: 07/25/2019   History of Present Illness  Patient is a 69 y/o female with recent history of nonhemorrhagic left paramedian pontine ischemic infarction. She underwent inpatient rehab and has residual R side weakness and dysarthria and reports STM deficits.  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  Clinical Impression  Patient presents with mobility decreased from baseline due to limited activity tolerance and decreased balance, R side awareness.  She will benefit from skilled PT in the acute setting to allow return home with initial family 24 hour assist and follow up HHPT at d/c.     Follow Up Recommendations Home health PT    Equipment Recommendations  None recommended by PT    Recommendations for Other Services       Precautions / Restrictions Precautions Precautions: Fall Precaution Comments: right hemiparesis Required Braces or Orthoses: Other Brace Other Brace: R AFO      Mobility  Bed Mobility Overal bed mobility: Needs Assistance Bed Mobility: Supine to Sit     Supine to sit: Min guard;HOB elevated     General bed mobility comments: to initiate R LE off bed  Transfers Overall transfer level: Needs assistance Equipment used: Rolling walker (2 wheeled)(R AFO) Transfers: Sit to/from Stand Sit to Stand: Min guard         General transfer comment: for safety and balance  Ambulation/Gait Ambulation/Gait assistance: Min guard Gait Distance (Feet): 200 Feet Assistive device: Rolling walker (2 wheeled) Gait Pattern/deviations: Step-to pattern;Step-through pattern;Decreased step length - right;Wide base of support     General Gait Details: assist to keep R hand on walker (uses walker splint at home), some cues for managing hand with ADL's  Stairs             Wheelchair Mobility    Modified Rankin (Stroke Patients Only)       Balance Overall balance assessment: Needs assistance   Sitting balance-Leahy Scale: Good     Standing balance support: No upper extremity supported Standing balance-Leahy Scale: Fair Standing balance comment: can stand without UE support briefly                             Pertinent Vitals/Pain Pain Assessment: No/denies pain Faces Pain Scale: No hurt    Home Living Family/patient expects to be discharged to:: Private residence Living Arrangements: Non-relatives/Friends Available Help at Discharge: Family;Friend(s);Available PRN/intermittently Type of Home: House Home Access: Stairs to enter Entrance Stairs-Rails: None Entrance Stairs-Number of Steps: 2 small steps Home Layout: Two level;Laundry or 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, contracting;Wheelchair - [employer] - 2 wheels(hand splint for walker) Additional Comments: boyfriend of 11 years lives next door; [place], daughter, is Radiation protection practitioner and unemployed    Prior Function Level of Independence: Needs assistance   Gait / Transfers Assistance Needed: walking with walker, R LE brace on her own or in w/c  ADL's / Homemaking Assistance Needed: daugter helps with showering        Hand Dominance   Dominant Hand: Right    Extremity/Trunk Assessment   Upper Extremity Assessment Upper Extremity Assessment: Defer to OT evaluation    Lower Extremity Assessment Lower Extremity Assessment: RLE deficits/detail RLE Deficits / Details: AAROM WFL, strength hip flexion 3+/5, knee extension 4/5, ankle DF 2/5 RLE Coordination: decreased gross motor;decreased fine motor  Communication   Communication: Expressive difficulties(dysarthria)  Cognition Arousal/Alertness: Awake/alert Behavior During Therapy: WFL for tasks assessed/performed Overall Cognitive Status: History of cognitive impairments - at baseline                                  General Comments: reports memory deficits since stroke      General Comments General comments (skin integrity, edema, etc.): VSS, no c/o dizziness    Exercises     Assessment/Plan    PT Assessment Patient needs continued PT services  PT Problem List Decreased balance;Decreased safety awareness;Decreased mobility;Decreased activity tolerance       PT Treatment Interventions DME instruction;Therapeutic activities;Gait training;Functional mobility training;Therapeutic exercise;Balance training;Stair training    PT Goals (Current goals can be found in the Care Plan section)  Acute Rehab PT Goals Patient Stated Goal: to go home PT Goal Formulation: With patient Time For Goal Achievement: 08/01/19 Potential to Achieve Goals: Good    Frequency Min 3X/week   Barriers to discharge        Co-evaluation PT/OT/SLP Co-Evaluation/Treatment: Yes Reason for Co-Treatment: For patient/therapist safety;To address functional/ADL transfers PT goals addressed during session: Mobility/safety with mobility;Balance;Proper use of DME         AM-PAC PT "6 Clicks" Mobility  Outcome Measure Help needed turning from your back to your side while in a flat bed without using bedrails?: A Little Help needed moving from lying on your back to sitting on the side of a flat bed without using bedrails?: A Little Help needed moving to and from a bed to a chair (including a wheelchair)?: A Little Help needed standing up from a chair using your arms (e.g., wheelchair or bedside chair)?: A Little Help needed to walk in hospital room?: A Little Help needed climbing 3-5 steps with a railing? : A Little 6 Click Score: 18    End of Session   Activity Tolerance: Patient tolerated treatment well Patient left: in chair;with call bell/phone within reach;with chair alarm set Nurse Communication: Mobility status PT Visit Diagnosis: Other abnormalities of gait and mobility (R26.89)     Time: 1059-1130 PT Time Calculation (min) (ACUTE ONLY): 31 min   Charges:   PT Evaluation $PT Eval Moderate Complexity: [place], PT Acute Rehabilitation Services [phone] 07/25/2019   Reginia Naas 07/25/2019, 1:08 PM

## 2019-07-25 NOTE — Plan of Care (Signed)
  Problem: Education: Goal: Knowledge of General Education information will improve Description: Including pain rating scale, medication(s)/side effects and non-pharmacologic comfort measures Outcome: Adequate for Discharge   Problem: Health Behavior/Discharge Planning: Goal: Ability to manage health-related needs will improve Outcome: Adequate for Discharge   Problem: Clinical Measurements: Goal: Ability to maintain clinical measurements within normal limits will improve Outcome: Adequate for Discharge Goal: Will remain free from infection Outcome: Adequate for Discharge Goal: Diagnostic test results will improve Outcome: Adequate for Discharge Goal: Respiratory complications will improve Outcome: Adequate for Discharge Goal: Cardiovascular complication will be avoided Outcome: Adequate for Discharge   Problem: Activity: Goal: Risk for activity intolerance will decrease Outcome: Adequate for Discharge   Problem: Nutrition: Goal: Adequate nutrition will be maintained Outcome: Adequate for Discharge   Problem: Elimination: Goal: Will not experience complications related to bowel motility Outcome: Adequate for Discharge Goal: Will not experience complications related to urinary retention Outcome: Adequate for Discharge   Problem: Pain Managment: Goal: General experience of comfort will improve Outcome: Adequate for Discharge   Problem: Safety: Goal: Ability to remain free from injury will improve Outcome: Adequate for Discharge   Problem: Skin Integrity: Goal: Risk for impaired skin integrity will decrease Outcome: Adequate for Discharge   Problem: Cardiovascular: Goal: Vascular access site(s) Level 0-1 will be maintained Outcome: Adequate for Discharge   Problem: Health Behavior/Discharge Planning: Goal: Ability to safely manage health-related needs after discharge will improve Outcome: Adequate for Discharge

## 2019-07-25 NOTE — Discharge Summary (Signed)
Patient ID: Megan Martinez MRN: [id number] DOB/AGE: 69-Feb-1952 69 y.o.  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 Estanislado Pandy. Procedure occurred without major complications and patient was transferred to neuro ICU in stable condition (VSS, right groin incision stable) for overnight observation. Patient had 1 episode of vomiting overnight, none has occurred since. Otherwise, no major events occurred overnight.  Patient awake and alert sitting in chair with no complaints at this time. Right groin incision stable. P2Y12 195 PRU today, patient was given an additional Plavix 75 mg today per Dr. Estanislado Pandy. Potassium found to be 2.7, patient was given 20 mEq potassium with re-draw being 2.9. Patient was then given 40 mEq potassium, repeat pending. PT/OT/SLP has seen patient and recommend home health- order placed and social work involved to help facilitate this. Plan to discharge home today (pending improvement of potassium lab) with home health and to follow-up with Dr. Estanislado Pandy in clinic 2 weeks after discharge.  Patient states that she needs a refill of Plavix. [place] [phone] at [phone] and left VM to fill prescription- Plavix 75 mg tablets, take one tablet by mouth once daily, dispense 30 tablets with 3 refills.   Consults: PT/OT/SLP  Significant Diagnostic Studies: DG Swallowing Func-Speech  Pathology  Result Date: 06/27/2019 Objective Swallowing Evaluation: Type of Study: MBS-Modified Barium Swallow Study  Patient Details Name: Megan Martinez MRN: [id number] Date of Birth: 08/22/1950 Today's Date: 06/27/2019 Time: SLP Start Time : 0902 -SLP Stop Time : 0915 SLP Time Calculation (min): 13 min Past Medical History: Past Medical History: Diagnosis Date . Allergy  . Arthritis  . Hypertension  . Thyroid disease  Past Surgical History: Past Surgical History: Procedure Laterality Date . ABDOMINAL HYSTERECTOMY   . APPENDECTOMY   . CATARACT EXTRACTION   . CHOLECYSTECTOMY   . TUBAL LIGATION   HPI: 69 y.o. female with a history of hypertension admitted with right sided hemiparesis and facial asymmetry; received IV TPA.  Dx acute left paramedian pontine CVA. Pt admitted to [facility] 06/20/19.  Subjective: alert, cooporative Assessment / Plan / Recommendation CHL IP CLINICAL IMPRESSIONS 06/27/2019 Clinical Impression Pt presents with improved ability to protect her airway with thin liquids without need for use of any compensatory strategies since last MBSS 06/14/19. Pt has a hx of silent aspiration, and previous instrumental study recommended chin tuck maneuver to prevent airway intrusion with thins. However, today no penetration or aspiration was observed throughout several trials of thin barium via cup and straw, or when consuming thin with barium pill. Pt's oral phase was only remarkable for reduced posterior propulsion of large chalky barium pill, and reduced cohesion of dual consistency thin with pill bolus. Given improvements in oropharyngeal swallow function, recommend pt continue current regular/thin diet and there is no longer a need for pt to perform chin tuck with liquids. Pt may consume small pills whole with thins, however large pills should either be broken to take with thin and/or consumed whole in applesauce (based on pt preference). SLP Visit Diagnosis Dysphagia,  unspecified (R13.10) Attention and  concentration deficit following -- Frontal lobe and executive function deficit following -- Impact on safety and function Mild aspiration risk   CHL IP TREATMENT RECOMMENDATION 06/15/2019 Treatment Recommendations Therapy as outlined in treatment plan below   Prognosis 06/27/2019 Prognosis for Safe Diet Advancement Good Barriers to Reach Goals -- Barriers/Prognosis Comment -- CHL IP DIET RECOMMENDATION 06/27/2019 SLP Diet Recommendations Regular solids;Thin liquid Liquid Administration via Cup;Straw Medication Administration Whole meds with liquid Compensations Minimize environmental distractions;Slow rate;Small sips/bites Postural Changes Remain semi-upright after after feeds/meals (Comment)   CHL IP OTHER RECOMMENDATIONS 06/27/2019 Recommended Consults -- Oral Care Recommendations Oral care BID Other Recommendations --   CHL IP FOLLOW UP RECOMMENDATIONS 06/18/2019 Follow up Recommendations Inpatient Rehab   CHL IP FREQUENCY AND DURATION 06/15/2019 Speech Therapy Frequency (ACUTE ONLY) min 2x/week Treatment Duration 2 weeks      CHL IP ORAL PHASE 06/27/2019 Oral Phase Impaired Oral - Pudding Teaspoon -- Oral - Pudding Cup -- Oral - Honey Teaspoon -- Oral - Honey Cup -- Oral - Nectar Teaspoon -- Oral - Nectar Cup -- Oral - Nectar Straw -- Oral - Thin Teaspoon -- Oral - Thin Cup WFL Oral - Thin Straw WFL Oral - Puree NT Oral - Mech Soft NT Oral - Regular -- Oral - Multi-Consistency -- Oral - Pill Lingual pumping;Reduced posterior propulsion Oral Phase - Comment --  CHL IP PHARYNGEAL PHASE 06/27/2019 Pharyngeal Phase WFL Pharyngeal- Pudding Teaspoon -- Pharyngeal -- Pharyngeal- Pudding Cup -- Pharyngeal -- Pharyngeal- Honey Teaspoon -- Pharyngeal -- Pharyngeal- Honey Cup -- Pharyngeal -- Pharyngeal- Nectar Teaspoon -- Pharyngeal -- Pharyngeal- Nectar Cup -- Pharyngeal -- Pharyngeal- Nectar Straw -- Pharyngeal -- Pharyngeal- Thin Teaspoon -- Pharyngeal -- Pharyngeal- Thin Cup WFL Pharyngeal -- Pharyngeal- Thin Straw WFL Pharyngeal  -- Pharyngeal- Puree NT Pharyngeal -- Pharyngeal- Mechanical Soft -- Pharyngeal -- Pharyngeal- Regular -- Pharyngeal -- Pharyngeal- Multi-consistency -- Pharyngeal -- Pharyngeal- Pill WFL Pharyngeal -- Pharyngeal Comment --  CHL IP CERVICAL ESOPHAGEAL PHASE 06/27/2019 Cervical Esophageal Phase WFL Pudding Teaspoon -- Pudding Cup -- Honey Teaspoon -- Honey Cup -- Nectar Teaspoon -- Nectar Cup -- Nectar Straw -- Thin Teaspoon -- Thin Cup -- Thin Straw -- Puree -- Mechanical Soft -- Regular -- Multi-consistency -- Pill -- Cervical Esophageal Comment -- Arbutus Leas 06/27/2019, 9:38 AM               Treatments: Endovascular revascularization of mid to distal basilar artery stenosis using stent assisted angioplasty  Discharge Exam: Blood pressure (!) 116/58, pulse (!) 58, temperature 98.6 F (37 C), temperature source Oral, resp. rate (!) 22, height 5\' 1"  (1.549 m), weight 160 lb 0.9 oz (72.6 kg), SpO2 95 %. Physical Exam  Disposition: Discharge disposition: 01-Home or Self Care       Discharge Instructions    Call MD for:  difficulty breathing, headache or visual disturbances   Complete by: As directed    Call MD for:  extreme fatigue   Complete by: As directed    Call MD for:  hives   Complete by: As directed    Call MD for:  persistant dizziness or light-headedness   Complete by: As directed    Call MD for:  persistant nausea and vomiting   Complete by: As directed    Call MD for:  redness, tenderness, or signs of infection (pain, swelling, redness, odor or green/yellow discharge around incision site)   Complete by: As directed    Call MD for:  severe  uncontrolled pain   Complete by: As directed    Call MD for:  temperature >100.4   Complete by: As directed    Diet - low sodium heart healthy   Complete by: As directed    Discharge instructions   Complete by: As directed    Continue taking Plavix 75 mg once daily. Continue taking Aspirin 325 mg once daily. No bending, stooping, or  lifting more than 10 pounds for 2 weeks. No driving self for 2 weeks. Stay hydrated by drinking plenty of water.   Increase activity slowly   Complete by: As directed    No dressing needed   Complete by: As directed    Ok to remove right groin bandage at home on day of discharge     Allergies as of 07/25/2019      Reactions   Latex Itching, Rash   Statins Other (See Comments)   Myalgias, "sick"   Amoxicillin Rash   Did it involve swelling of the face/tongue/throat, SOB, or low BP? No Did it involve sudden or severe rash/hives, skin peeling, or any reaction on the inside of your mouth or nose? Yes Did you need to seek medical attention at a hospital or doctor's office? N/A When did it last happen? Child If all above answers are "NO", may proceed with cephalosporin use.   Codeine Nausea Only   Penicillin G Rash   Did it involve swelling of the face/tongue/throat, SOB, or low BP? No Did it involve sudden or severe rash/hives, skin peeling, or any reaction on the inside of your mouth or nose? Yes Did you need to seek medical attention at a hospital or doctor's office? N/A When did it last happen?Child If all above answers are "NO", may proceed with cephalosporin use.      Medication List    TAKE these medications   acetaminophen 325 MG tablet Commonly known as: TYLENOL Take 2 tablets (650 mg total) by mouth every 4 (four) hours as needed for mild pain (or temp > 37.5 C (99.5 F)).   amLODipine 5 MG tablet Commonly known as: NORVASC Take 1 tablet (5 mg total) by mouth daily.   aspirin 325 MG EC tablet Take 1 tablet (325 mg total) by mouth daily.   atorvastatin 80 MG tablet Commonly known as: LIPITOR Take 1 tablet (80 mg total) by mouth daily at 6 PM.   clopidogrel 75 MG tablet Commonly known as: PLAVIX Take 1 tablet (75 mg total) by mouth daily.   fluticasone 50 MCG/ACT nasal spray Commonly known as: FLONASE Place 2 sprays into both nostrils daily. What changed:    when to take this  reasons to take this   levocetirizine 5 MG tablet Commonly known as: XYZAL Take 5 mg by mouth daily.   levothyroxine 137 MCG tablet Commonly known as: SYNTHROID Take 1 tablet (137 mcg total) by mouth daily at 6 (six) AM.   pantoprazole 40 MG tablet Commonly known as: PROTONIX Take 1 tablet (40 mg total) by mouth at bedtime.      Follow-up Information    [name], DO. Call.   Specialty: Family Medicine Why: Call for follow up 1-2 weeks Contact information: [address] [place] [address] [place] Follow up.   Why: Physical, occupational and speech therapist to follow up with you at home.  They will call  you for an appointment Contact information: Phone:  [phone]  Luanne Bras, MD Follow up in 2 week(s).   Specialties: Interventional Radiology, Radiology Why: Please follow-up with Dr. Estanislado Pandy in clinic 2 weeks after discharge. Our office will call you to set up this appointment. Contact information: [place]  [address] [phone]            Electronically Signed: Earley Abide, PA-C 07/25/2019, 4:17 PM   I have spent Greater Than 30 Minutes discharging Rubin Payor.

## 2019-07-25 NOTE — Progress Notes (Signed)
CRITICAL VALUE STICKER  CRITICAL VALUE: K+ 2.7   RECEIVER (on-site recipient of call): Delorise Jackson RN   [place] NOTIFIED: 07/25/19   MESSENGER (representative from lab):  MD NOTIFIED: MD Luanne Bras, PA Alexandra Louk  TIME OF NOTIFICATION: [phone]  RESPONSE: new orders placed

## 2019-07-25 NOTE — Evaluation (Signed)
Speech Language Pathology Evaluation Patient Details Name: Megan Martinez MRN: [id number] DOB: 08-15-1950 Today's Date: 07/25/2019 Time: [id number] SLP Time Calculation (min) (ACUTE ONLY): 21 min  Problem List:  Patient Active Problem List   Diagnosis Date Noted  . H/O cerebral artery stenosis 07/24/2019  . 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 Abnormality of gait 07/19/2019  . Right wrist drop   . Right foot drop   . Hypokalemia   . Stenosis of artery ([place])   . Benign essential HTN   . Stage 2 chronic kidney disease   . Prediabetes   . Transaminitis   . Hypoalbuminemia due to protein-calorie malnutrition ([place])   . Labile blood pressure   . Left pontine cerebrovascular accident ([place]) 06/20/2019  . Acute ischemic stroke ([place])   . Right hemiparesis ([place])   . Dysphagia, post-stroke   . Basilar artery stenosis with infarction [facility]) s/p tPA 06/19/2019  . Stroke (cerebrum) ([place]) L pontine d/t BA stenosis 06/14/2019  . Subcutaneous nodule of left foot 01/03/2019  . Atopic dermatitis 10/18/2018  . Seasonal allergies 08/09/2018  . 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 Osteoarthritis of carpometacarpal (CMC) joint of thumb 11/07/2017  . Acquired hypothyroidism 08/04/2015  . Essential hypertension 08/04/2015  . GERD without esophagitis 08/04/2015  . Mixed hyperlipidemia 08/04/2015  . Osteopenia 08/04/2015  . Primary insomnia 08/04/2015  . Pure hypertriglyceridemia 08/04/2015   Past Medical History:  Past Medical History:  Diagnosis Date  . Allergy   . Arthritis   . GERD (gastroesophageal reflux disease)   . Hypertension   . Hypothyroidism   . Pneumonia   . Stroke [facility])    right arm is "not working", speech a little slurred  . Thyroid disease    Past Surgical History:  Past Surgical History:  Procedure Laterality Date  . ABDOMINAL HYSTERECTOMY    . APPENDECTOMY    . BREAST SURGERY  Right    lumpectomy  . CATARACT EXTRACTION    . CHOLECYSTECTOMY    . RADIOLOGY WITH ANESTHESIA N/A 07/09/2019   Procedure: RADIOLOGY WITH ANESTHESIA STENT PLACEMENT;  Surgeon: Luanne Bras, MD;  Location: [facility];  Service: Radiology;  Laterality: N/A;  . RADIOLOGY WITH ANESTHESIA N/A 07/23/2019   Procedure: RADIOLOGY WITH ANESTHESIA STENT PLACEMENT;  Surgeon: Luanne Bras, MD;  Location: [place];  Service: Radiology;  Laterality: N/A;  . TUBAL LIGATION     HPI:  69 y.o. female with a history of hypertension admitted with right sided hemiparesis and facial asymmetry; received IV TPA.  Dx acute left paramedian pontine CVA. Pt admitted to [facility] 1/27-2/10/21.   Assessment / Plan / Recommendation Clinical Impression   SLP administered COGNISTAT to assess pt's speech, language and cognitive abilities. Pt presents with a mild short term memory impairment and decreased initellgibility, remaining ~90-95% intelligible. Pt reported she noticed some short term memory deficits following her stroke, but currently uses memory aids (lists, reminders, etc) to compensate. Pt was discharged from CIR on 07/04/19, with plans to continue with [facility] SLP services, but hasn't been officially set up yet. Recommend continuing with HH/outpaitent services and needs can be defered to next level of care.     SLP Assessment  SLP Recommendation/Assessment: All further Speech Lanaguage Pathology  needs can be addressed in the next venue of care SLP Visit Diagnosis: Cognitive communication deficit (R41.841)    Follow Up Recommendations  Home health SLP;Outpatient SLP    Frequency and Duration  SLP Evaluation Cognition  Overall Cognitive Status: History of cognitive impairments - at baseline Orientation Level: Oriented X4 Memory: Impaired Memory Impairment: Decreased short term memory;Decreased recall of new information;Storage deficit Decreased Short Term Memory: Verbal complex       Comprehension   Auditory Comprehension Overall Auditory Comprehension: Appears within functional limits for tasks assessed Visual Recognition/Discrimination Discrimination: Within Function Limits Reading Comprehension Reading Status: Within funtional limits    Expression Expression Primary Mode of Expression: Verbal Verbal Expression Overall Verbal Expression: Appears within functional limits for tasks assessed   Oral / Motor  Oral Motor/Sensory Function Overall Oral Motor/Sensory Function: Within functional limits Motor Speech Overall Motor Speech: Impaired Respiration: Within functional limits Phonation: Normal Resonance: Hypernasality Articulation: Impaired Level of Impairment: Conversation Intelligibility: Intelligibility reduced Word: 75-100% accurate Phrase: 75-100% accurate Sentence: 75-100% accurate Conversation: 75-100% accurate Motor Planning: Witnin functional limits Motor Speech Errors: Not applicable   GO                   Aline August, Student SLP Office: [phone]  07/25/2019, 11:00 AM

## 2019-07-25 NOTE — Progress Notes (Signed)
Referring Physician(s): Rosalin Hawking  Supervising Physician: Luanne Bras  Patient Status:  Megan Martinez - In-pt  Chief Complaint: None  Subjective:  Mid to distal basilar artery stenosis s/p revascularization using stent assisted angioplasty 07/24/2019 by Dr. Estanislado Pandy. Patient awake and alert laying in bed with no complaints at this time. Claiborne Billings, RN at bedside- states that patient vomited x1 last evening. Per patient she woke up dizzy and that is why she vomited. Denies dizziness, nausea/vomiting since. Tolerating crackers well. Still with dysarthria and right-sided weakness, no change from post-procedure. Right groin incision c/d/i.   Allergies: Latex, Statins, Amoxicillin, Codeine, and Penicillin g  Medications: Prior to Admission medications   Medication Sig Start Date End Date Taking? Authorizing Provider  amLODipine (NORVASC) 5 MG tablet Take 1 tablet (5 mg total) by mouth daily. 07/06/19  Yes Angiulli, Lavon Paganini, PA-C  aspirin EC 325 MG EC tablet Take 1 tablet (325 mg total) by mouth daily. 06/20/19  Yes Donzetta Starch, NP  clopidogrel (PLAVIX) 75 MG tablet Take 1 tablet (75 mg total) by mouth daily. 07/04/19  Yes Angiulli, Lavon Paganini, PA-C  levothyroxine (SYNTHROID) 137 MCG tablet Take 1 tablet (137 mcg total) by mouth daily at 6 (six) AM. 07/04/19  Yes Angiulli, Lavon Paganini, PA-C  acetaminophen (TYLENOL) 325 MG tablet Take 2 tablets (650 mg total) by mouth every 4 (four) hours as needed for mild pain (or temp > 37.5 C (99.5 F)). 07/04/19   Angiulli, Lavon Paganini, PA-C  atorvastatin (LIPITOR) 80 MG tablet Take 1 tablet (80 mg total) by mouth daily at 6 PM. 07/04/19   Angiulli, Lavon Paganini, PA-C  fluticasone (FLONASE) 50 MCG/ACT nasal spray Place 2 sprays into both nostrils daily. Patient taking differently: Place 2 sprays into both nostrils daily as needed for allergies.  08/09/18   [name], DO  levocetirizine (XYZAL) 5 MG tablet Take 5 mg by mouth daily.    [provider]    pantoprazole (PROTONIX) 40 MG tablet Take 1 tablet (40 mg total) by mouth at bedtime. 07/04/19   Angiulli, Lavon Paganini, PA-C     Vital Signs: BP 129/62   Pulse (!) 54   Temp 98.6 F (37 C) (Oral)   Resp 18   Ht 5\' 1"  (1.549 m)   Wt 160 lb 0.9 oz (72.6 kg)   SpO2 92%   BMI 30.24 kg/m   Physical Exam Vitals and nursing note reviewed.  Constitutional:      General: She is not in acute distress.    Appearance: Normal appearance.  Pulmonary:     Effort: Pulmonary effort is normal. No respiratory distress.  Skin:    General: Skin is warm and dry.     Comments: Right groin incision soft without active bleeding or hematoma.  Neurological:     Mental Status: She is alert.     Comments: Alert, awake, and oriented x3. Speech dysarthric (no change from prior to procedure), can follow simple commands and answer questions appropriately. PERRL bilaterally. No facial asymmetry. Can spontaneously move left side, can bend right elbow and can barely lift RUE off bed but cannot hold, can raise RLE off bed/bend RLE at knee. Distal pulses 1+ bilaterally (DPs).  Psychiatric:        Mood and Affect: Mood normal.        Behavior: Behavior normal.     Imaging: No results found.  Labs:  CBC: Recent Labs    07/02/19 0522 07/09/19 [id number] 07/23/19 [id number] 07/25/19 [id number]  WBC 5.4 6.4 5.3 8.3  HGB 12.3 14.5 14.0 11.2*  HCT 37.5 44.0 41.7 33.0*  PLT 268 301 256 245    COAGS: Recent Labs    06/14/19 0719 07/09/19 0734 07/23/19 0733  INR 1.0 1.0 1.1  APTT 27  --   --     BMP: Recent Labs    07/02/19 0522 07/09/19 0734 07/23/19 0733 07/25/19 0548  NA 144 142 144 140  K 3.6 3.2* 2.9* 2.7*  CL 104 105 105 106  CO2 26 27 27 25   GLUCOSE 128* 124* 114* 105*  BUN 13 10 10  7*  CALCIUM 9.1 9.4 9.4 8.5*  CREATININE 0.89 0.78 0.77 0.64  GFRNONAA >60 >60 >60 >60  GFRAA >60 >60 >60 >60    LIVER FUNCTION TESTS: Recent Labs    06/14/19 0719 06/21/19 0457 06/25/19 0556 07/02/19 0522   BILITOT 0.5 0.6 0.9 0.5  AST 19 50* 25 18  ALT 19 53* 36 25  ALKPHOS 38 30* 31* 33*  PROT 6.5 6.2* 6.0* 5.9*  ALBUMIN 3.9 3.4* 3.5 3.2*    Assessment and Plan:  Mid to distal basilar artery stenosis s/p revascularization using stent assisted angioplasty 07/24/2019 by Dr. Estanislado Pandy. Patient's condition stable this AM- no change to neurologic function, still with right-sided weakness and dysarthria. Right groin incision stable, distal pulses 1+ bilaterally (DPs). Ok to remove foley and arterial line. Advance diet as tolerated. P2Y12 195 PRU this AM- discussed with Dr. Estanislado Pandy who recommends patient take Plavix 150 mg today, continue taking Aspirin 325 mg once daily, and re-start taking Plavix 75 mg once daily starting tomorrowClaiborne Billings, RN aware. K+ 2.7 today- per Dr. Estanislado Pandy administer potassium chloride 20 mEq PO x1 and repeat BMP 3 hours afterClaiborne Billings, RN aware. Plan for possible discharge today pending PT/OT/SLP consults- appreciate recommendations for/assistance with this patient. NIR to follow.   Electronically Signed: Earley Abide, PA-C 07/25/2019, 8:39 AM   I spent a total of 25 Minutes at the the patient's bedside AND on the patient's hospital floor or unit, greater than 50% of which was counseling/coordinating care for mid to distal basilar artery stenosis s/p revascularization.

## 2019-07-25 NOTE — Progress Notes (Signed)
Patient verbalizes and demonstrates understanding of discharge summary, instructions, and medications.

## 2019-07-25 NOTE — Progress Notes (Signed)
Called MD Deveshwar to inform of recent potassium result trending up to 3.0.  Deveshwar confirms he is okay with pt being discharged now.

## 2019-07-26 ENCOUNTER — Telehealth: Payer: Self-pay | Admitting: *Deleted

## 2019-07-26 NOTE — Telephone Encounter (Signed)
Unable to reach pt. 1st attempt.  

## 2019-07-27 NOTE — Telephone Encounter (Signed)
I have made two attempts and have been unable to reach patient. I have mailed the patient a letter requesting they call the office to schedule a hospital follow up appointment.   

## 2019-07-28 ENCOUNTER — Encounter (HOSPITAL_COMMUNITY): Payer: Self-pay | Admitting: Radiology

## 2019-07-30 ENCOUNTER — Telehealth: Payer: Self-pay | Admitting: Family Medicine

## 2019-07-30 ENCOUNTER — Telehealth (HOSPITAL_COMMUNITY): Payer: Self-pay

## 2019-07-30 DIAGNOSIS — Z87891 Personal history of nicotine dependence: Secondary | ICD-10-CM | POA: Diagnosis not present

## 2019-07-30 DIAGNOSIS — M1990 Unspecified osteoarthritis, unspecified site: Secondary | ICD-10-CM | POA: Diagnosis not present

## 2019-07-30 DIAGNOSIS — I10 Essential (primary) hypertension: Secondary | ICD-10-CM | POA: Diagnosis not present

## 2019-07-30 DIAGNOSIS — J309 Allergic rhinitis, unspecified: Secondary | ICD-10-CM | POA: Diagnosis not present

## 2019-07-30 DIAGNOSIS — Z955 Presence of coronary angioplasty implant and graft: Secondary | ICD-10-CM | POA: Diagnosis not present

## 2019-07-30 DIAGNOSIS — I69351 Hemiplegia and hemiparesis following cerebral infarction affecting right dominant side: Secondary | ICD-10-CM | POA: Diagnosis not present

## 2019-07-30 DIAGNOSIS — Z8701 Personal history of pneumonia (recurrent): Secondary | ICD-10-CM | POA: Diagnosis not present

## 2019-07-30 DIAGNOSIS — Z7982 Long term (current) use of aspirin: Secondary | ICD-10-CM | POA: Diagnosis not present

## 2019-07-30 DIAGNOSIS — Z48812 Encounter for surgical aftercare following surgery on the circulatory system: Secondary | ICD-10-CM | POA: Diagnosis not present

## 2019-07-30 DIAGNOSIS — Z7902 Long term (current) use of antithrombotics/antiplatelets: Secondary | ICD-10-CM | POA: Diagnosis not present

## 2019-07-30 DIAGNOSIS — I69322 Dysarthria following cerebral infarction: Secondary | ICD-10-CM | POA: Diagnosis not present

## 2019-07-30 DIAGNOSIS — E039 Hypothyroidism, unspecified: Secondary | ICD-10-CM | POA: Diagnosis not present

## 2019-07-30 DIAGNOSIS — K219 Gastro-esophageal reflux disease without esophagitis: Secondary | ICD-10-CM | POA: Diagnosis not present

## 2019-07-30 NOTE — Telephone Encounter (Signed)
Returned patients call no answer unable to leave a message. Will call back.

## 2019-07-30 NOTE — Telephone Encounter (Signed)
Patient is calling and wanted to speak to someone regarding medication. CB is [phone]

## 2019-07-30 NOTE — Telephone Encounter (Signed)
Called to schedule 2 week f/u, no answer, vm full. AW  

## 2019-08-01 NOTE — Telephone Encounter (Signed)
Called patient, no answer mailbox full will await on patient to return the missed calls.

## 2019-08-02 ENCOUNTER — Telehealth (HOSPITAL_COMMUNITY): Payer: Self-pay

## 2019-08-02 NOTE — Telephone Encounter (Signed)
Called to schedule 2 wk f/u, no answer, vm full. AW

## 2019-08-07 ENCOUNTER — Other Ambulatory Visit: Payer: Self-pay

## 2019-08-07 ENCOUNTER — Encounter: Payer: Self-pay | Admitting: Family Medicine

## 2019-08-07 ENCOUNTER — Ambulatory Visit (INDEPENDENT_AMBULATORY_CARE_PROVIDER_SITE_OTHER): Payer: PPO | Admitting: Family Medicine

## 2019-08-07 VITALS — BP 132/70 | HR 67 | Temp 96.6°F | Ht 61.0 in | Wt 155.4 lb

## 2019-08-07 DIAGNOSIS — E7800 Pure hypercholesterolemia, unspecified: Secondary | ICD-10-CM

## 2019-08-07 DIAGNOSIS — E039 Hypothyroidism, unspecified: Secondary | ICD-10-CM

## 2019-08-07 DIAGNOSIS — E1165 Type 2 diabetes mellitus with hyperglycemia: Secondary | ICD-10-CM

## 2019-08-07 DIAGNOSIS — I10 Essential (primary) hypertension: Secondary | ICD-10-CM

## 2019-08-07 MED ORDER — CHLORTHALIDONE 25 MG PO TABS: 25.0000 mg | ORAL_TABLET | Freq: Every day | ORAL | 1 refills | Status: DC

## 2019-08-07 MED ORDER — AMLODIPINE BESYLATE 10 MG PO TABS: 10.0000 mg | ORAL_TABLET | Freq: Every day | ORAL | 3 refills | Status: DC

## 2019-08-07 NOTE — Progress Notes (Signed)
Established Patient Office Visit  Subjective:  Patient ID: Megan Martinez, female    DOB: August 10, 1950  Age: 69 y.o. MRN: [id number]  CC:  Chief Complaint  Patient presents with  . Hospitalization Follow-up    hospital follow up, pt was told to ask if she should continue on BP medications due to her allergy to statins.     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  Patient is status post revascularization of her mid to distal basilar artery with stent 14 days ago.  Hemiparesis persists.  Physical therapy will start in a few days.  Scheduled follow-up for neurology next week.  Patient reports a cough ever since she has started the Atacand sometime ago with Dr. Zigmund Daniel.  She is currently taking 32 mg of Atacand with 25 mg of HCTZ.  She is on 5 mg of Norvasc.  She has been taking high-dose Lipitor without ill effect.  Recent blood work drawn to the hospital included CMP, CBC, TSH and lipids.  These were all reviewed.  Will be rechecking these labs in the next 3 months.  He has some thank you  Past Medical History:  Diagnosis Date  . Allergy   . Arthritis   . GERD (gastroesophageal reflux disease)   . Hypertension   . Hypothyroidism   . Pneumonia   . Stroke [facility])    right arm is "not working", speech a little slurred  . Thyroid disease     Past Surgical History:  Procedure Laterality Date  . ABDOMINAL HYSTERECTOMY    . APPENDECTOMY    . BREAST SURGERY Right    lumpectomy  . CATARACT EXTRACTION    . CHOLECYSTECTOMY    . IR ANGIO INTRA EXTRACRAN SEL COM CAROTID INNOMINATE BILAT MOD SED  07/24/2019  . IR ANGIO VERTEBRAL SEL SUBCLAVIAN INNOMINATE UNI L MOD SED  07/24/2019  . IR INTRA CRAN STENT  07/24/2019  . RADIOLOGY WITH ANESTHESIA N/A 07/09/2019   Procedure: RADIOLOGY WITH ANESTHESIA STENT PLACEMENT;  Surgeon: Luanne Bras, MD;  Location: [place];  Service: Radiology;  Laterality: N/A;  . RADIOLOGY WITH  ANESTHESIA N/A 07/23/2019   Procedure: RADIOLOGY WITH ANESTHESIA STENT PLACEMENT;  Surgeon: Luanne Bras, MD;  Location: [place];  Service: Radiology;  Laterality: N/A;  . RADIOLOGY WITH ANESTHESIA N/A 07/24/2019   Procedure: RADIOLOGY WITH ANESTHESIA STENT PLACEMENT;  Surgeon: Luanne Bras, MD;  Location: [place];  Service: Radiology;  Laterality: N/A;  . TUBAL LIGATION      No family history on file.  Social History   Socioeconomic History  . Marital status: Married    Spouse name: Not on file  . Number of children: Not on file  . Years of education: Not on file  . Highest education level: Not on file  Occupational History  . Not on file  Tobacco Use  . Smoking status: Former Smoker    Quit date: 1970    Years since quitting: 51.2  . Smokeless tobacco: Never Used  Substance and Sexual Activity  . Alcohol use: Yes    Comment: occasional  . Drug use: Never  . Sexual activity: Not on file  Other Topics Concern  . Not on file  Social History Narrative  . Not on file   Social Determinants of Health   Financial Resource Strain:   . Difficulty of Paying Living Expenses:   Food Insecurity:   . Worried About Charity fundraiser  in the Last Year:   . [place] in the Last Year:   Transportation Needs:   . Film/video editor (Medical):   Marland Kitchen Lack of Transportation (Non-Medical):   Physical Activity:   . Days of Exercise per Week:   . Minutes of Exercise per Session:   Stress:   . Feeling of Stress :   Social Connections:   . Frequency of Communication with Friends and Family:   . Frequency of Social Gatherings with Friends and Family:   . Attends Religious Services:   . Active Member of Clubs or Organizations:   . Attends Archivist Meetings:   Marland Kitchen Marital Status:   Intimate Partner Violence:   . Fear of Current or Ex-Partner:   . Emotionally Abused:   Marland Kitchen Physically Abused:   . Sexually Abused:     Outpatient Medications Prior to Visit    Medication Sig Dispense Refill  . acetaminophen (TYLENOL) 325 MG tablet Take 2 tablets (650 mg total) by mouth every 4 (four) hours as needed for mild pain (or temp > 37.5 C (99.5 F)).    Marland Kitchen aspirin EC 325 MG EC tablet Take 1 tablet (325 mg total) by mouth daily. 30 tablet 0  . atorvastatin (LIPITOR) 80 MG tablet Take 1 tablet (80 mg total) by mouth daily at 6 PM. 30 tablet 1  . clopidogrel (PLAVIX) 75 MG tablet Take 1 tablet (75 mg total) by mouth daily. 30 tablet 1  . fluticasone (FLONASE) 50 MCG/ACT nasal spray Place 2 sprays into both nostrils daily. (Patient taking differently: Place 2 sprays into both nostrils daily as needed for allergies. ) 16 g 6  . levothyroxine (SYNTHROID) 137 MCG tablet Take 1 tablet (137 mcg total) by mouth daily at 6 (six) AM. 30 tablet 1  . amLODipine (NORVASC) 5 MG tablet Take 1 tablet (5 mg total) by mouth daily. 30 tablet 1  . candesartan (ATACAND) 32 MG tablet Take 32 mg by mouth daily.    Marland Kitchen levocetirizine (XYZAL) 5 MG tablet Take 5 mg by mouth daily.    . pantoprazole (PROTONIX) 40 MG tablet Take 1 tablet (40 mg total) by mouth at bedtime. (Patient not taking: Reported on 08/07/2019) 30 tablet 0   Facility-Administered Medications Prior to Visit  Medication Dose Route Frequency Provider Last Rate Last Admin  . midazolam (VERSED) 5 MG/5ML injection   Intravenous Anesthesia Intra-op Mariea Clonts, CRNA   1 mg at 07/09/19 0845    Allergies  Allergen Reactions  . Latex Itching and Rash  . Statins Other (See Comments)    Myalgias, "sick"   . Amoxicillin Rash    Did it involve swelling of the face/tongue/throat, SOB, or low BP? No Did it involve sudden or severe rash/hives, skin peeling, or any reaction on the inside of your mouth or nose? Yes Did you need to seek medical attention at a hospital or doctor's office? N/A When did it last happen? Child If all above answers are "NO", may proceed with cephalosporin use.  . Codeine Nausea Only  .  Penicillin G Rash    Did it involve swelling of the face/tongue/throat, SOB, or low BP? No Did it involve sudden or severe rash/hives, skin peeling, or any reaction on the inside of your mouth or nose? Yes Did you need to seek medical attention at a hospital or doctor's office? N/A When did it last happen?Child If all above answers are "NO", may proceed with cephalosporin use.  ROS Review of Systems  Constitutional: Negative.   Respiratory: Negative.   Cardiovascular: Negative.   Gastrointestinal: Negative.   Genitourinary: Negative.   Musculoskeletal: Positive for gait problem.  Skin: Negative.   Neurological: Positive for facial asymmetry and weakness.  Hematological: Does not bruise/bleed easily.  Psychiatric/Behavioral: Negative.       Objective:    Physical Exam  Constitutional: She is oriented to person, place, and time. She appears well-developed and well-nourished. No distress.  HENT:  Head: Normocephalic and atraumatic.  Right Ear: External ear normal.  Left Ear: External ear normal.  Eyes: Conjunctivae are normal. Right eye exhibits no discharge. Left eye exhibits no discharge. No scleral icterus.  Neck: No JVD present. No tracheal deviation present.  Cardiovascular: Normal rate, regular rhythm and normal heart sounds.  Pulmonary/Chest: Effort normal and breath sounds normal. No stridor.  Neurological: She is alert and oriented to person, place, and time.  Skin: Skin is warm and dry. She is not diaphoretic.  Psychiatric: She has a normal mood and affect. Her behavior is normal.    BP 132/70   Pulse 67   Temp (!) 96.6 F (35.9 C) (Tympanic)   Ht 5\' 1"  (1.549 m)   Wt 155 lb 6.4 oz (70.5 kg)   SpO2 95%   BMI 29.36 kg/m  Wt Readings from Last 3 Encounters:  08/07/19 155 lb 6.4 oz (70.5 kg)  07/24/19 160 lb 0.9 oz (72.6 kg)  07/23/19 160 lb (72.6 kg)     Health Maintenance Due  Topic Date Due  . Hepatitis C Screening  Never done  . FOOT EXAM  Never  done  . OPHTHALMOLOGY EXAM  Never done  . URINE MICROALBUMIN  Never done  . TETANUS/TDAP  Never done  . MAMMOGRAM  Never done  . COLONOSCOPY  Never done  . DEXA SCAN  Never done    There are no preventive care reminders to display for this patient.  Lab Results  Component Value Date   TSH 1.249 06/16/2019   Lab Results  Component Value Date   WBC 8.3 07/25/2019   HGB 11.2 (L) 07/25/2019   HCT 33.0 (L) 07/25/2019   MCV 86.2 07/25/2019   PLT 245 07/25/2019   Lab Results  Component Value Date   NA 141 07/25/2019   K 3.0 (L) 07/25/2019   CO2 25 07/25/2019   GLUCOSE 129 (H) 07/25/2019   BUN 8 07/25/2019   CREATININE 0.90 07/25/2019   BILITOT 0.5 07/02/2019   ALKPHOS 33 (L) 07/02/2019   AST 18 07/02/2019   ALT 25 07/02/2019   PROT 5.9 (L) 07/02/2019   ALBUMIN 3.2 (L) 07/02/2019   CALCIUM 8.9 07/25/2019   ANIONGAP 9 07/25/2019   GFR 62.19 01/03/2019   Lab Results  Component Value Date   CHOL 204 (H) 06/15/2019   Lab Results  Component Value Date   HDL 35 (L) 06/15/2019   Lab Results  Component Value Date   LDLCALC 137 (H) 06/15/2019   Lab Results  Component Value Date   TRIG 161 (H) 06/15/2019   Lab Results  Component Value Date   CHOLHDL 5.8 06/15/2019   Lab Results  Component Value Date   HGBA1C 5.8 (H) 06/15/2019      Assessment & Plan:   Problem List Items Addressed This Visit      Cardiovascular and Mediastinum   Essential hypertension - Primary   Relevant Medications   amLODipine (NORVASC) 10 MG tablet   chlorthalidone (HYGROTON) 25 MG tablet  Endocrine   Acquired hypothyroidism   Type 2 diabetes mellitus with hyperglycemia, without long-term current use of insulin (HCC)     Other   Elevated LDL cholesterol level      Meds ordered this encounter  Medications  . amLODipine (NORVASC) 10 MG tablet    Sig: Take 1 tablet (10 mg total) by mouth daily.    Dispense:  90 tablet    Refill:  3  . chlorthalidone (HYGROTON) 25 MG tablet     Sig: Take 1 tablet (25 mg total) by mouth daily.    Dispense:  90 tablet    Refill:  1    Follow-up: Return in about 1 month (around 09/07/2019), or Discontinue Atacand/HCTZ.  Start chlorthalidone and Norvasc at 10 mg..   Hopeful that her cough will resolve with discontinuation of the Atacand.  Can take continue high-dose statin.  Reviewed recent labs drawn at the hospital.  These will not be redrawn again in 3 months. Libby Maw, MD

## 2019-08-08 ENCOUNTER — Inpatient Hospital Stay: Payer: PPO | Admitting: Adult Health

## 2019-08-08 DIAGNOSIS — I10 Essential (primary) hypertension: Secondary | ICD-10-CM | POA: Diagnosis not present

## 2019-08-08 DIAGNOSIS — Z955 Presence of coronary angioplasty implant and graft: Secondary | ICD-10-CM | POA: Diagnosis not present

## 2019-08-08 DIAGNOSIS — J309 Allergic rhinitis, unspecified: Secondary | ICD-10-CM | POA: Diagnosis not present

## 2019-08-08 DIAGNOSIS — Z87891 Personal history of nicotine dependence: Secondary | ICD-10-CM | POA: Diagnosis not present

## 2019-08-08 DIAGNOSIS — K219 Gastro-esophageal reflux disease without esophagitis: Secondary | ICD-10-CM | POA: Diagnosis not present

## 2019-08-08 DIAGNOSIS — Z7982 Long term (current) use of aspirin: Secondary | ICD-10-CM | POA: Diagnosis not present

## 2019-08-08 DIAGNOSIS — I69322 Dysarthria following cerebral infarction: Secondary | ICD-10-CM | POA: Diagnosis not present

## 2019-08-08 DIAGNOSIS — I69351 Hemiplegia and hemiparesis following cerebral infarction affecting right dominant side: Secondary | ICD-10-CM | POA: Diagnosis not present

## 2019-08-08 DIAGNOSIS — Z48812 Encounter for surgical aftercare following surgery on the circulatory system: Secondary | ICD-10-CM | POA: Diagnosis not present

## 2019-08-08 DIAGNOSIS — Z8701 Personal history of pneumonia (recurrent): Secondary | ICD-10-CM | POA: Diagnosis not present

## 2019-08-08 DIAGNOSIS — E039 Hypothyroidism, unspecified: Secondary | ICD-10-CM | POA: Diagnosis not present

## 2019-08-08 DIAGNOSIS — M1990 Unspecified osteoarthritis, unspecified site: Secondary | ICD-10-CM | POA: Diagnosis not present

## 2019-08-08 DIAGNOSIS — Z7902 Long term (current) use of antithrombotics/antiplatelets: Secondary | ICD-10-CM | POA: Diagnosis not present

## 2019-08-14 DIAGNOSIS — Z8701 Personal history of pneumonia (recurrent): Secondary | ICD-10-CM | POA: Diagnosis not present

## 2019-08-14 DIAGNOSIS — Z48812 Encounter for surgical aftercare following surgery on the circulatory system: Secondary | ICD-10-CM | POA: Diagnosis not present

## 2019-08-14 DIAGNOSIS — J309 Allergic rhinitis, unspecified: Secondary | ICD-10-CM | POA: Diagnosis not present

## 2019-08-14 DIAGNOSIS — Z7902 Long term (current) use of antithrombotics/antiplatelets: Secondary | ICD-10-CM | POA: Diagnosis not present

## 2019-08-14 DIAGNOSIS — Z955 Presence of coronary angioplasty implant and graft: Secondary | ICD-10-CM | POA: Diagnosis not present

## 2019-08-14 DIAGNOSIS — M1990 Unspecified osteoarthritis, unspecified site: Secondary | ICD-10-CM | POA: Diagnosis not present

## 2019-08-14 DIAGNOSIS — Z7982 Long term (current) use of aspirin: Secondary | ICD-10-CM | POA: Diagnosis not present

## 2019-08-14 DIAGNOSIS — Z87891 Personal history of nicotine dependence: Secondary | ICD-10-CM | POA: Diagnosis not present

## 2019-08-14 DIAGNOSIS — I69322 Dysarthria following cerebral infarction: Secondary | ICD-10-CM | POA: Diagnosis not present

## 2019-08-14 DIAGNOSIS — I69351 Hemiplegia and hemiparesis following cerebral infarction affecting right dominant side: Secondary | ICD-10-CM | POA: Diagnosis not present

## 2019-08-14 DIAGNOSIS — E039 Hypothyroidism, unspecified: Secondary | ICD-10-CM | POA: Diagnosis not present

## 2019-08-14 DIAGNOSIS — I10 Essential (primary) hypertension: Secondary | ICD-10-CM | POA: Diagnosis not present

## 2019-08-14 DIAGNOSIS — K219 Gastro-esophageal reflux disease without esophagitis: Secondary | ICD-10-CM | POA: Diagnosis not present

## 2019-08-15 ENCOUNTER — Ambulatory Visit (HOSPITAL_COMMUNITY)
Admission: RE | Admit: 2019-08-15 | Discharge: 2019-08-15 | Disposition: A | Discharge status: Home / Self Care | Payer: PPO | Source: Ambulatory Visit | Attending: Student | Admitting: Student

## 2019-08-15 ENCOUNTER — Other Ambulatory Visit: Payer: Self-pay

## 2019-08-15 DIAGNOSIS — I6322 Cerebral infarction due to unspecified occlusion or stenosis of basilar arteries: Secondary | ICD-10-CM

## 2019-08-15 NOTE — Progress Notes (Signed)
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  Referring Physician(s): Rosalin Hawking  Supervising Physician: Luanne Bras  Patient Status: [facility] - Out-pt  History of Present Illness: Megan Martinez is a 69 y.o. female with a past medical history as below, with pertinent past medical history including hypertension, CVA 05/2019, pneumonia, GERD, hypothyroidism, and arthritis. She is known to [facility] and has been followed by Dr. Estanislado Pandy since 05/2019. She first presented to our department at the request of Dr. 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 She underwent an image-guided cerebral arteriogram with revascularization of mid to distal basilar artery stenosis using stent assisted angioplasty 07/24/2019 by Dr. Estanislado Pandy. She was discharged home 07/25/2019 in stable condition.  Patient presents today for follow-up regarding her recent procedure 07/24/2019. Patient awake and alert sitting in chair. 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 Denies headache, numbness/tingling, dizziness, vision changes, hearing changes, or tinnitus.  Currently taking Plavix 75 mg once daily and Aspirin 325 mg once daily.   Past Medical History:  Diagnosis Date  . Allergy   . Arthritis   . GERD (gastroesophageal reflux disease)   . Hypertension   . Hypothyroidism   . Pneumonia   . Stroke [facility])    right arm is "not working", speech a little slurred  . Thyroid disease     Past Surgical History:  Procedure Laterality Date  . ABDOMINAL HYSTERECTOMY    . APPENDECTOMY    . BREAST SURGERY Right    lumpectomy  . CATARACT EXTRACTION    . CHOLECYSTECTOMY    . IR ANGIO INTRA EXTRACRAN SEL COM CAROTID INNOMINATE BILAT MOD SED  07/24/2019  . IR ANGIO VERTEBRAL SEL SUBCLAVIAN  INNOMINATE UNI L MOD SED  07/24/2019  . IR INTRA CRAN STENT  07/24/2019  . RADIOLOGY WITH ANESTHESIA N/A 07/09/2019   Procedure: RADIOLOGY WITH ANESTHESIA STENT PLACEMENT;  Surgeon: Luanne Bras, MD;  Location: [place];  Service: Radiology;  Laterality: N/A;  . RADIOLOGY WITH ANESTHESIA N/A 07/23/2019   Procedure: RADIOLOGY WITH ANESTHESIA STENT PLACEMENT;  Surgeon: Luanne Bras, MD;  Location: [place];  Service: Radiology;  Laterality: N/A;  . RADIOLOGY WITH ANESTHESIA N/A 07/24/2019   Procedure: RADIOLOGY WITH ANESTHESIA STENT PLACEMENT;  Surgeon: Luanne Bras, MD;  Location: [place];  Service: Radiology;  Laterality: N/A;  . TUBAL LIGATION      Allergies: Latex, Statins, Amoxicillin, Codeine, and Penicillin g  Medications: Prior to Admission medications   Medication Sig Start Date End Date Taking? Authorizing Provider  acetaminophen (TYLENOL) 325 MG tablet Take 2 tablets (650 mg total) by mouth every 4 (four) hours as needed for mild pain (or temp > 37.5 C (99.5 F)). 07/04/19   Angiulli, Lavon Paganini, PA-C  amLODipine (NORVASC) 10 MG tablet Take 1 tablet (10 mg total) by mouth daily. 08/07/19   Libby Maw, MD  aspirin EC 325 MG EC tablet Take 1 tablet (325 mg total) by mouth daily. 06/20/19   Donzetta Starch, NP  atorvastatin (LIPITOR) 80 MG tablet Take 1 tablet (80 mg total) by mouth daily at 6 PM. 07/04/19   Angiulli, Lavon Paganini, PA-C  chlorthalidone (HYGROTON) 25 MG tablet Take 1 tablet (25 mg total) by mouth daily. 08/07/19   Libby Maw, MD  clopidogrel (PLAVIX) 75 MG tablet Take 1 tablet (75 mg  total) by mouth daily. 07/04/19   Angiulli, Lavon Paganini, PA-C  fluticasone (FLONASE) 50 MCG/ACT nasal spray Place 2 sprays into both nostrils daily. Patient taking differently: Place 2 sprays into both nostrils daily as needed for allergies.  08/09/18   [name], DO  levocetirizine (XYZAL) 5 MG tablet Take 5 mg by mouth daily.    [provider]  levothyroxine  (SYNTHROID) 137 MCG tablet Take 1 tablet (137 mcg total) by mouth daily at 6 (six) AM. 07/04/19   Angiulli, Lavon Paganini, PA-C  pantoprazole (PROTONIX) 40 MG tablet Take 1 tablet (40 mg total) by mouth at bedtime. Patient not taking: Reported on 08/07/2019 07/04/19   Angiulli, Lavon Paganini, PA-C     No family history on file.  Social History   Socioeconomic History  . Marital status: Married    Spouse name: Not on file  . Number of children: Not on file  . Years of education: Not on file  . Highest education level: Not on file  Occupational History  . Not on file  Tobacco Use  . Smoking status: Former Smoker    Quit date: 1970    Years since quitting: 51.2  . Smokeless tobacco: Never Used  Substance and Sexual Activity  . Alcohol use: Yes    Comment: occasional  . Drug use: Never  . Sexual activity: Not on file  Other Topics Concern  . Not on file  Social History Narrative  . Not on file   Social Determinants of Health   Financial Resource Strain:   . Difficulty of Paying Living Expenses:   Food Insecurity:   . Worried About Charity fundraiser in the Last Year:   . Arboriculturist in the Last Year:   Transportation Needs:   . Film/video editor (Medical):   Marland Kitchen Lack of Transportation (Non-Medical):   Physical Activity:   . Days of Exercise per Week:   . Minutes of Exercise per Session:   Stress:   . Feeling of Stress :   Social Connections:   . Frequency of Communication with Friends and Family:   . Frequency of Social Gatherings with Friends and Family:   . Attends Religious Services:   . Active Member of Clubs or Organizations:   . Attends Archivist Meetings:   Marland Kitchen Marital Status:      Review of Systems: A 12 point ROS discussed and pertinent positives are indicated in the HPI above.  All other systems are negative.  Review of Systems  Constitutional: Negative for chills and fever.  HENT: Negative for hearing loss and tinnitus.   Eyes: Negative for  visual disturbance.  Respiratory: Negative for shortness of breath and wheezing.   Cardiovascular: Negative for chest pain and palpitations.  Neurological: Positive for speech difficulty and weakness. Negative for dizziness, numbness and headaches.  Psychiatric/Behavioral: Negative for behavioral problems and confusion.    Vital Signs: There were no vitals taken for this visit.  Physical Exam Constitutional:      General: She is not in acute distress.    Appearance: Normal appearance.  Pulmonary:     Effort: Pulmonary effort is normal. No respiratory distress.  Skin:    General: Skin is warm and dry.  Neurological:     Mental Status: She is alert and oriented to person, place, and time.     Comments: Can spontaneously move left side and RLE; can raise RUE to shoulder but not above head; can extend RUE fingers.  Psychiatric:        Mood and Affect: Mood normal.        Behavior: Behavior normal.      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 EXAM: INTRACRANIAL STENT (INCL PTA) COMPARISON:  CT angiogram of the head and neck of June 14, 2019, and MRI of the brain of June 14, 2019. MEDICATIONS: Heparin 4,000 units IV; vancomycin 1 g IV antibiotic was administered within 1 hour of the procedure. ANESTHESIA/SEDATION: General anesthesia. CONTRAST:  Isovue 300 approximately 110 mL. FLUOROSCOPY TIME:  Fluoroscopy Time: 66 minutes 12 seconds (3515 mGy). COMPLICATIONS: None immediate. TECHNIQUE: Informed written consent was obtained from the patient after a thorough discussion of the procedural risks, benefits and alternatives. All questions were addressed. Maximal Sterile Barrier Technique was utilized including caps, mask, sterile gowns, sterile gloves, sterile drape, hand hygiene and skin antiseptic. A timeout was performed prior to the initiation of  the procedure. The right groin was prepped and draped in the usual sterile fashion. Thereafter using modified Seldinger technique, transfemoral access into the right common femoral artery was obtained without difficulty. Over a 0.035 inch guidewire, a 5 French Pinnacle sheath was inserted. Through this, and also over 0.035 inch guidewire, a 5 [place] JB 1 catheter was advanced to the aortic arch region and selectively positioned in the right common carotid artery, , the right vertebral artery, the left common carotid artery and the left subclavian artery. FINDINGS: The innominate arteriogram demonstrates the origins of the right subclavian artery and the right common carotid artery to be widely patent. The right vertebral artery origin is widely patent. The vessel is seen to opacify to the cranial skull base with mild tortuosity in the cervical portion. More distally the right vertebrobasilar junction and the right posterior-inferior cerebellar artery demonstrate patency. The right posteroinferior cerebellar artery demonstrates focal areas of smooth narrowing in its proximal 1/3 most consistent with intracranial arteriosclerotic disease. Distal to this, there is mild narrowing of the right vertebrobasilar junction just at the confluence to form the basilar artery. The basilar artery demonstrates a high-grade approximately 80% focal segmental stenosis in the mid segment with perforators arising on either side at the site of narrowing. Distal to this, there is mild narrowing of the distal basilar artery. The right posterior cerebral artery demonstrates approximately 50% narrowing in its distal P1 segment and to a lesser degree the P3 segment. The superior cerebellar arteries appear widely patent with moderate stenosis at the origin of the left superior cerebellar artery. The anterior-inferior cerebellar arteries demonstrate wide patency into the capillary and venous phases. Retrograde opacification is seen of the left  vertebrobasilar junction and to the left posterior-inferior cerebellar artery with the latter showing mild signs of intracranial arteriosclerosis. The right common carotid arteriogram demonstrates the right external carotid artery and its major branches to be widely patent. The right internal carotid artery demonstrates wide patency with mild FMD-like changes involving the proximal 1/3. The petrous, cavernous and the supraclinoid segments are widely patent. The right middle cerebral artery and the right anterior cerebral artery opacify into the capillary and venous phases. The superior division of the right middle cerebral artery demonstrates significant proximal narrowing though opacification is seen of the distal branches. Mild callosal marginal and pericallosal arterial narrowing is suggestive of intracranial arteriosclerosis. The left subclavian arteriogram demonstrates approximately 70% stenosis at the origin of the dominant left  vertebral artery. More distally the vessel opacifies to the cranial skull base. There is mild fusiform dilatation of the left vertebral artery at the level of C1. More distally the vessel is seen to opacify distally into the basilar artery. The left posterior-inferior cerebellar artery again demonstrates mild focal areas of caliber narrowing likely secondary to intracranial arteriosclerosis. Distal to this left vertebrobasilar junction is widely patent. The opacified portion of the basilar artery demonstrates a high-grade stenosis in its mid segment with mild proximal narrowing with unopacified blood is seen in this region. The right posterior cerebral artery again demonstrates a 50% narrowing of the distal P1 segment, and also of the P3 region. The right superior cerebellar artery is widely patent. There is a moderate stenosis at the origin of the left superior cerebellar artery. The anterior inferior cerebellar artery on the left opacifies into the capillary and venous phases.  Unopacified blood is seen in the basilar artery from the contralateral vertebral artery. ENDOVASCULAR REVASCULARIZATION OF SYMPTOMATIC HIGH-GRADE STENOSIS OF THE MID BASILAR ARTERY WITH STENT ASSISTED ANGIOPLASTY The diagnostic JB 1 catheter in the distal right subclavian artery was then exchanged over a 0.035 inch 300 cm Rosen exchange guidewire for an 80 cm 8 [place] Neuron Max sheath. The guidewire was removed. Good aspiration was obtained from the hub of the Neuron Max sheath. Gentle contrast injection demonstrated no evidence of spasms, dissections or of intraluminal filling defects. Over a 0.035 inch Roadrunner guidewire, a 115 cm 5 [place] Catalyst guide catheter was then advanced just distal to the Neuron Max sheath. The combination was retrieved proximally to the origin of the right vertebral artery. The 0.035 inch Roadrunner guidewire was then advanced without difficulty into the distal right vertebral artery followed by the 5 [place] Catalyst guide catheter. The guidewire was then removed. Good aspiration was obtained from the hub of the 5 [place] Catalyst guide catheter. A gentle control arteriogram performed through this demonstrated no evidence of spasms, dissections or of intraluminal filling defects. The Neuron Max sheath was advanced further into the right vertebral artery in its proximal 1/3. Measurements were performed of the basilar artery distal and also proximal to the high-grade stenosis. The length of the lesion was also evaluated. It was decided to proceed with balloon angioplasty using a 2.25 mm x 15 mm Gateway balloon microcatheter. This was prepped and purged with 50% contrast and 50% heparinized saline infusion. Over a 0.014 standard Synchro micro guidewire with a J configuration the combination was navigated to the distal end of the 5 [place] Catalyst guide catheter which now had been advanced into the proximal right vertebrobasilar junction. Using biplane roadmap technique and constant  fluoroscopic guidance in a coaxial manner and with constant heparinized saline infusion, the micro guidewire was then gently guided with a torque device to the proximal basilar artery followed by the balloon. The micro guidewire was then gently advanced without difficulty into the distal P2 P3 segment of the right posterior cerebral artery followed by the advancement of the Gateway balloon into the site of the severe stenosis. The proximal and distal marker were then positioned properly. Thereafter, a flow angioplasty was then performed using micro inflation syringe device via micro tubing. Slow increments of pressure gradient were utilized in order to achieve a diameter of approximately 2.23 mm at 5.3 atmospheres where it was maintained for approximately 1-1/2 minutes. Thereafter, the balloon was deflated and retrieved proximally. A control arteriogram performed through the 5 [place] Catalyst guide catheter demonstrated modestly improved caliber and flow  through the angioplastied segment. This prompted a second balloon angioplasty being advanced to the site of the stenosis. At this time, slow increments of atmospheric pressure were increased to 6 atmospheres achieving a 2.25 mm diameter. This was maintained for approximately 1 minute. Thereafter, following deflation, the balloon was advanced into the proximal P1 segment. A control arteriogram performed through the 5 [place] Catalyst guide catheter now demonstrates significantly improved caliber and flow through the angioplastied segment of the mid basilar artery in both the AP and lateral projections. The Gateway balloon was then exchanged for a 300 cm Softip Transend EX exchange micro guidewire with a J configuration under constant roadmap technique and constant fluoroscopic guidance. Following the exchange, a control arteriogram performed through the 5 [place] Catalyst guide catheter demonstrates safe positioning of the tip of the exchange micro guidewire in the P2  P3 junction. Following assessment of the measurements of the basilar artery, it was elected to proceed with placement of a 4 mm x 20 mm Wingspan stent across the angioplastied segment of the mid basilar artery. The delivery apparatus of the Wingspan was then prepped and purged with heparinized saline infusion at its catheter delivery end, and also the inner core. The Wingspan delivery system was then advanced over the exchange micro guidewire with constant fluoroscopic guidance of the distal end of the micro guidewire. The system was eventually advanced with the distal and the proximal markers of the stent being positioned adequate distant from the site of the angioplasty. The O ring on the delivery microcatheter was then loosened. With slight traction with the right hand on the inner core abutting against the proximal portion of the stent, the microcatheter was unsheathing the distal, and then the proximal stent. A control arteriogram performed through the Catalyst guide catheter demonstrated excellent apposition of the proximal and the distal portion of the stent. The delivery micro guidewire and the microcatheter were retrieved and removed. A control arteriogram performed through the 5 [place] Catalyst guide catheter in the right vertebrobasilar junction demonstrated excellent apposition with significantly improved caliber and flow through the treated segment of the mid basilar artery with a approximately 90% patency. Control arteriograms were then performed at 15 and 30 minutes post placement of the stent. A final control arteriogram performed through the 5 [place] Catalyst guide catheter in the right vertebral artery demonstrated excellent apposition and positioning of the Wingspan stent. No new filling defects or of occlusion of the posterior fossa circulation was seen. Throughout the procedure, the patient's blood pressure and neurological status remained stable. The patient's ACT was maintained in region of  approximately 220 seconds. The Neuron Max sheath, with the 5 [place] Catalyst guide catheter were retrieved and removed. The right groin arteriotomy site was then sealed with an 8 [place] Angio-Seal system with hemostasis. The right groin appeared soft. Distal pulses remained Dopplerable in the dorsalis pedis, and the posterior tibial regions bilaterally. The patient's general anesthesia was then reversed, and the patient was extubated. Upon recovery, the patient demonstrated no new neurological signs or symptoms. She did not complain of any headaches, nausea or vomiting. She denied any visual symptoms such as diplopia, or blurred vision. She was then transferred to the neuro ICU to continue with post revascularization management including close neurologic observations, control of her blood pressure, and also on low-dose IV heparin. The patient had an uneventful overnight stay was able to eat normal meals the following day. The IV heparin was stopped and the patient was switched to aspirin 325 mg  a day, and Plavix 75 mg a day. PT OT evaluated her for home health. Her preprocedural level of activity prior to her discharge under the care of her boyfriend. The patient was asked to maintain adequate hydration and take her dual antiplatelets. She was asked to call [phone] should she develop new neurological symptoms. Patient expressed understanding and agreement with the above management plan. 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 PLAN: Follow-up in the clinic approximately 2 weeks post discharge. Electronically Signed   By: Luanne Bras M.D.   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  EXAM: INTRACRANIAL STENT (INCL PTA)   COMPARISON:  CT angiogram of the head and neck of June 14, 2019, and MRI of the brain of June 14, 2019.  MEDICATIONS: Heparin 4,000 units IV; vancomycin 1 g IV antibiotic was administered within 1 hour of the procedure.  ANESTHESIA/SEDATION: General anesthesia.  CONTRAST:  Isovue 300 approximately 110 mL.  FLUOROSCOPY TIME:  Fluoroscopy Time: 66 minutes 12 seconds (3515 mGy).  COMPLICATIONS: None immediate.  TECHNIQUE: Informed written consent was obtained from the patient after a thorough discussion of the procedural risks, benefits and alternatives. All questions were addressed. Maximal Sterile Barrier Technique was utilized including caps, mask, sterile gowns, sterile gloves, sterile drape, hand hygiene and skin antiseptic. A timeout was performed prior to the initiation of the procedure.  The right groin was prepped and draped in the usual sterile fashion. Thereafter using modified Seldinger technique, transfemoral access into the right common femoral artery was obtained without difficulty. Over a 0.035 inch guidewire, a 5 French Pinnacle sheath was inserted. Through this, and also over 0.035 inch guidewire, a 5 [place] JB 1 catheter was advanced to the aortic arch region and selectively positioned in the right common carotid artery, , the right vertebral artery, the left common carotid artery and the left subclavian artery.  FINDINGS: The innominate arteriogram demonstrates the origins of the right subclavian artery and the right common carotid artery to be widely patent.  The right vertebral artery origin is widely patent.  The vessel is seen to opacify to the cranial skull base with mild tortuosity in the cervical portion.  More distally the right vertebrobasilar junction and the right posterior-inferior cerebellar artery demonstrate patency.  The right posteroinferior cerebellar artery demonstrates focal areas of smooth narrowing in its proximal 1/3 most consistent with intracranial  arteriosclerotic disease.  Distal to this, there is mild narrowing of the right vertebrobasilar junction just at the confluence to form the basilar artery.  The basilar artery demonstrates a high-grade approximately 80% focal segmental stenosis in the mid segment with perforators arising on either side at the site of narrowing.  Distal to this, there is mild narrowing of the distal basilar artery. The right posterior cerebral artery demonstrates approximately 50% narrowing in its distal P1 segment and to a lesser degree the P3 segment.  The superior cerebellar arteries appear widely patent with moderate stenosis at the origin of the left superior cerebellar artery.  The anterior-inferior cerebellar arteries demonstrate wide patency into the capillary and venous phases. Retrograde opacification is seen of the left vertebrobasilar junction and to the left posterior-inferior cerebellar artery with the latter showing mild signs of intracranial arteriosclerosis.  The right common carotid  arteriogram demonstrates the right external carotid artery and its major branches to be widely patent.  The right internal carotid artery demonstrates wide patency with mild FMD-like changes involving the proximal 1/3. The petrous, cavernous and the supraclinoid segments are widely patent.  The right middle cerebral artery and the right anterior cerebral artery opacify into the capillary and venous phases.  The superior division of the right middle cerebral artery demonstrates significant proximal narrowing though opacification is seen of the distal branches. Mild callosal marginal and pericallosal arterial narrowing is suggestive of intracranial arteriosclerosis.  The left subclavian arteriogram demonstrates approximately 70% stenosis at the origin of the dominant left vertebral artery.  More distally the vessel opacifies to the cranial skull base.  There is mild fusiform dilatation of the left vertebral artery at the level  of C1.  More distally the vessel is seen to opacify distally into the basilar artery. The left posterior-inferior cerebellar artery again demonstrates mild focal areas of caliber narrowing likely secondary to intracranial arteriosclerosis.  Distal to this left vertebrobasilar junction is widely patent.  The opacified portion of the basilar artery demonstrates a high-grade stenosis in its mid segment with mild proximal narrowing with unopacified blood is seen in this region.  The right posterior cerebral artery again demonstrates a 50% narrowing of the distal P1 segment, and also of the P3 region.  The right superior cerebellar artery is widely patent. There is a moderate stenosis at the origin of the left superior cerebellar artery. The anterior inferior cerebellar artery on the left opacifies into the capillary and venous phases. Unopacified blood is seen in the basilar artery from the contralateral vertebral artery.  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 The guidewire was removed. Good aspiration was obtained from the hub of the Neuron Max sheath. Gentle contrast injection demonstrated no evidence of spasms, dissections or of intraluminal filling defects. Over a 0.035 inch Roadrunner guidewire, a 115 cm 5 [place] Catalyst guide catheter was then advanced just distal to the Neuron Max sheath.  The combination was retrieved proximally to the origin of the right vertebral artery.  The 0.035 inch Roadrunner guidewire was then advanced without difficulty into the distal right vertebral artery followed by the 5 [place] Catalyst guide catheter.  The guidewire was then removed. Good aspiration was obtained from the hub of the 5 [place] Catalyst guide  catheter. A gentle control arteriogram performed through this demonstrated no evidence of spasms, dissections or of intraluminal filling defects.  The Neuron Max sheath was advanced further into the right vertebral artery in its proximal 1/3.  Measurements were performed of the basilar artery distal and also proximal to the high-grade stenosis. The length of the lesion was also evaluated.  It was decided to proceed with balloon angioplasty using a 2.25 mm x 15 mm Gateway balloon microcatheter.  This was prepped and purged with 50% contrast and 50% heparinized saline infusion.  Over a 0.014 standard Synchro micro guidewire with a J configuration the combination was navigated to the distal end of the 5 [place] Catalyst guide catheter which now had been advanced into the proximal right vertebrobasilar junction.  Using biplane roadmap technique and constant fluoroscopic guidance in a coaxial manner and with constant heparinized saline infusion, the micro guidewire was  then gently guided with a torque device to the proximal basilar artery followed by the balloon. The micro guidewire was then gently advanced without difficulty into the distal P2 P3 segment of the right posterior cerebral artery followed by the advancement of the Gateway balloon into the site of the severe stenosis. The proximal and distal marker were then positioned properly.  Thereafter, a flow angioplasty was then performed using micro inflation syringe device via micro tubing. Slow increments of pressure gradient were utilized in order to achieve a diameter of approximately 2.23 mm at 5.3 atmospheres where it was maintained for approximately 1-1/2 minutes.  Thereafter, the balloon was deflated and retrieved proximally.  A control arteriogram performed through the 5 [place] Catalyst guide catheter demonstrated modestly improved caliber and flow through the angioplastied segment.  This prompted a second balloon angioplasty being advanced to the  site of the stenosis. At this time, slow increments of atmospheric pressure were increased to 6 atmospheres achieving a 2.25 mm diameter. This was maintained for approximately 1 minute. Thereafter, following deflation, the balloon was advanced into the proximal P1 segment.  A control arteriogram performed through the 5 [place] Catalyst guide catheter now demonstrates significantly improved caliber and flow through the angioplastied segment of the mid basilar artery in both the AP and lateral projections.  The Gateway balloon was then exchanged for a 300 cm Softip Transend EX exchange micro guidewire with a J configuration under constant roadmap technique and constant fluoroscopic guidance. Following the exchange, a control arteriogram performed through the 5 [place] Catalyst guide catheter demonstrates safe positioning of the tip of the exchange micro guidewire in the P2 P3 junction.  Following assessment of the measurements of the basilar artery, it was elected to proceed with placement of a 4 mm x 20 mm Wingspan stent across the angioplastied segment of the mid basilar artery.  The delivery apparatus of the Wingspan was then prepped and purged with heparinized saline infusion at its catheter delivery end, and also the inner core.  The Wingspan delivery system was then advanced over the exchange micro guidewire with constant fluoroscopic guidance of the distal end of the micro guidewire. The system was eventually advanced with the distal and the proximal markers of the stent being positioned adequate distant from the site of the angioplasty.  The O ring on the delivery microcatheter was then loosened. With slight traction with the right hand on the inner core abutting against the proximal portion of the stent, the microcatheter was unsheathing the distal, and then the proximal stent. A control arteriogram performed through the Catalyst guide catheter demonstrated excellent apposition of the proximal and the  distal portion of the stent. The delivery micro guidewire and the microcatheter were retrieved and removed. A control arteriogram performed through the 5 [place] Catalyst guide catheter in the right vertebrobasilar junction demonstrated excellent apposition with significantly improved caliber and flow through the treated segment of the mid basilar artery with a approximately 90% patency.  Control arteriograms were then performed at 15 and 30 minutes post placement of the stent.  A final control arteriogram performed through the 5 [place] Catalyst guide catheter in the right vertebral artery demonstrated excellent apposition and positioning of the Wingspan stent. No new filling defects or of occlusion of the posterior fossa circulation was seen.  Throughout the procedure, the patient's blood pressure and neurological status remained stable.  The patient's ACT was maintained in region of approximately 220 seconds.  The Neuron Max sheath, with the [place]  guide catheter were retrieved and removed. The right groin arteriotomy site was then sealed with an 8 [place] Angio-Seal system with hemostasis. The right groin appeared soft. Distal pulses remained Dopplerable in the dorsalis pedis, and the posterior tibial regions bilaterally.  The patient's general anesthesia was then reversed, and the patient was extubated.  Upon recovery, the patient demonstrated no new neurological signs or symptoms. She did not complain of any headaches, nausea or vomiting. She denied any visual symptoms such as diplopia, or blurred vision.  She was then transferred to the neuro ICU to continue with post revascularization management including close neurologic observations, control of her blood pressure, and also on low-dose IV heparin.  The patient had an uneventful overnight stay was able to eat normal meals the following day. The IV heparin was stopped and the patient was switched to aspirin 325 mg a day, and Plavix 75 mg a  day. PT OT evaluated her for home health. Her preprocedural level of activity prior to her discharge under the care of her boyfriend.  The patient was asked to maintain adequate hydration and take her dual antiplatelets. She was asked to call 911 should she develop new neurological symptoms. Patient expressed understanding and agreement with the above management plan.  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  PLAN: Follow-up in the clinic approximately 2 weeks post discharge.   Electronically Signed   By: Luanne Bras M.D.   On: 07/26/2019 13:49    Labs:  CBC: Recent Labs    07/02/19 0522 07/09/19 0734 07/23/19 0733 07/25/19 0548  WBC 5.4 6.4 5.3 8.3  HGB 12.3 14.5 14.0 11.2*  HCT 37.5 44.0 41.7 33.0*  PLT 268 301 256 245    COAGS: Recent Labs    06/14/19 0719 07/09/19 0734 07/23/19 0733  INR 1.0 1.0 1.1  APTT 27  --   --     BMP: Recent Labs    07/09/19 0734 07/09/19 0734 07/23/19 0733 07/25/19 0548 07/25/19 1130 07/25/19 1633  NA 142  --  144 140 141  --   K 3.2*   < > 2.9* 2.7* 2.9* 3.0*  CL 105  --  105 106 107  --   CO2 27  --  27 25 25   --   GLUCOSE 124*  --  114* 105* 129*  --   BUN 10  --  10 7* 8  --   CALCIUM 9.4  --  9.4 8.5* 8.9  --   CREATININE 0.78  --  0.77 0.64 0.90  --   GFRNONAA >60  --  >60 >60 >60  --   GFRAA >60  --  >60 >60 >60  --    < > = values in this interval not displayed.    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 Estanislado Pandy. Dr. Estanislado Pandy was present for consultation.  Discussed patient's residual CVA  symptoms. States that her speech has improved. States speech therapy came to her home and told her that she no longer required speech therapy. States that her  right-sided weakness has improved but still present. States that she works with PT/OT at home and in clinic. Instructed patient to continue working with PT/OT as directed.  Discussed basilar artery stenosis s/p stent placement. Explained the best course of management for her stent at this time is routine imaging scans to monitor for changes. Plan for follow-up CTA head/neck (with contrast) 4 months from procedure 07/24/2019. Informed patient that our schedulers will call her to set up this imaging scan. Instructed patient to continue taking Plavix 75 mg once daily and Aspirin 325 mg once daily.  Discussed secondary stroke prevention. Instructed patient to follow-up with her neurologist regularly.  All questions answered and concerns addressed. Patient conveys understanding and agrees with plan.  Thank you for this interesting consult.  I greatly enjoyed meeting DEAVEON DOWNEY and look forward to participating in their care.  A copy of this report was sent to the requesting provider on this date.  Electronically Signed: Earley Abide, PA-C 08/15/2019, 10:18 AM   I spent a total of 40 Minutes in face to face in clinical consultation, greater than 50% of which was counseling/coordinating care for mid to distal basilar artery stenosis s/p revascularization.

## 2019-08-16 ENCOUNTER — Encounter: Payer: Self-pay | Admitting: Physical Medicine & Rehabilitation

## 2019-08-16 ENCOUNTER — Encounter: Payer: PPO | Attending: Physical Medicine & Rehabilitation | Admitting: Physical Medicine & Rehabilitation

## 2019-08-16 ENCOUNTER — Other Ambulatory Visit: Payer: Self-pay

## 2019-08-16 VITALS — BP 139/78 | HR 76 | Temp 97.7°F | Ht 64.0 in | Wt 155.0 lb

## 2019-08-16 DIAGNOSIS — I771 Stricture of artery: Secondary | ICD-10-CM | POA: Diagnosis not present

## 2019-08-16 DIAGNOSIS — I6350 Cerebral infarction due to unspecified occlusion or stenosis of unspecified cerebral artery: Secondary | ICD-10-CM

## 2019-08-16 DIAGNOSIS — I6322 Cerebral infarction due to unspecified occlusion or stenosis of basilar arteries: Secondary | ICD-10-CM | POA: Diagnosis not present

## 2019-08-16 DIAGNOSIS — R269 Unspecified abnormalities of gait and mobility: Secondary | ICD-10-CM | POA: Diagnosis not present

## 2019-08-16 DIAGNOSIS — I10 Essential (primary) hypertension: Secondary | ICD-10-CM | POA: Diagnosis not present

## 2019-08-16 DIAGNOSIS — I639 Cerebral infarction, unspecified: Secondary | ICD-10-CM

## 2019-08-16 NOTE — Progress Notes (Addendum)
Subjective:    Patient ID: Megan Martinez, female    DOB: [birth year], 69 y.o.   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   Last clinic visit on 07/19/2019.  Since that time, had basilar artery stenting.  She was also having trouble scheduling with home health agency, communication exchange.   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 She denies difficulty with procedure or post procedure.  She is in [facility]. She has not made an appointment with Neurology yet.  BP is improving. Denies falls. Using wheelchair/walker.   Pain Inventory Average Pain 0 Pain Right Now 1 My pain is intermittent and aching  In the last 24 hours, has pain interfered with the following? General activity 0 Relation with others 0 Enjoyment of life 0 What TIME of day is your pain at its worst? night Sleep (in general) Fair  Pain is worse with: bending and . Pain improves with: rest Relief from Meds: 0  Mobility use a walker ability to climb steps?  no do you drive?  no  Function retired  Neuro/Psych tingling trouble walking  Prior Studies Any changes since last visit?  no  Physicians involved in your care Any changes since last visit?  no   No family history on file. Social History   Socioeconomic History  . Marital status: Married    Spouse name: Not on file  . Number of children: Not on file  . Years of education: Not on file  . Highest education level: Not on file  Occupational History  . Not on file  Tobacco Use  . Smoking status: Former Smoker    Quit date: 1970    Years since quitting: 51.2  . Smokeless tobacco: Never Used  Substance and Sexual Activity  . Alcohol use: Yes    Comment: occasional   . Drug use: Never  . Sexual activity: Not on file  Other Topics Concern  . Not on file  Social History Narrative  . Not on file   Social Determinants of Health   Financial Resource Strain:   . Difficulty of Paying Living Expenses:   Food Insecurity:   . Worried About Charity fundraiser in the Last Year:   . Arboriculturist in the Last Year:   Transportation Needs:   . Film/video editor (Medical):   Marland Kitchen Lack of Transportation (Non-Medical):   Physical Activity:   . Days of Exercise per Week:   . Minutes of Exercise per Session:   Stress:   . Feeling of Stress :   Social Connections:   . Frequency of Communication with Friends and Family:   . Frequency of Social Gatherings with Friends and Family:   . Attends Religious Services:   . Active Member of Clubs or Organizations:   . Attends Archivist Meetings:   Marland Kitchen Marital Status:    Past Surgical History:  Procedure Laterality Date  . ABDOMINAL HYSTERECTOMY    . APPENDECTOMY    . BREAST SURGERY Right    lumpectomy  . CATARACT EXTRACTION    . CHOLECYSTECTOMY    . IR ANGIO INTRA EXTRACRAN SEL COM CAROTID INNOMINATE BILAT MOD SED  07/24/2019  . IR ANGIO VERTEBRAL SEL SUBCLAVIAN INNOMINATE UNI L MOD SED  07/24/2019  .  IR INTRA CRAN STENT  07/24/2019  . RADIOLOGY WITH ANESTHESIA N/A 07/09/2019   Procedure: RADIOLOGY WITH ANESTHESIA STENT PLACEMENT;  Surgeon: Luanne Bras, MD;  Location: [facility];  Service: Radiology;  Laterality: N/A;  . RADIOLOGY WITH ANESTHESIA N/A 07/23/2019   Procedure: RADIOLOGY WITH ANESTHESIA STENT PLACEMENT;  Surgeon: Luanne Bras, MD;  Location: [facility];  Service: Radiology;  Laterality: N/A;  . RADIOLOGY WITH ANESTHESIA N/A 07/24/2019   Procedure: RADIOLOGY WITH ANESTHESIA STENT PLACEMENT;  Surgeon: Luanne Bras, MD;  Location: [facility];  Service: Radiology;  Laterality: N/A;  . TUBAL LIGATION     Past Medical History:  Diagnosis Date  . Allergy   . Arthritis   . GERD  (gastroesophageal reflux disease)   . Hypertension   . Hypothyroidism   . Pneumonia   . Stroke [facility])    right arm is "not working", speech a little slurred  . Thyroid disease    BP 139/78   Pulse 76   Temp 97.7 F (36.5 C)   Ht 5\' 4"  (1.626 m)   Wt 155 lb (70.3 kg)   SpO2 98%   BMI 26.61 kg/m   Opioid Risk Score:   Fall Risk Score:  `1  Depression screen PHQ 2/9  No flowsheet data found.   Review of Systems  Constitutional: Negative.   HENT: Negative.   Eyes: Negative.   Respiratory: Negative.   Cardiovascular: Negative.   Gastrointestinal: Negative.   Endocrine: Negative.   Genitourinary: Negative.   Musculoskeletal: Positive for gait problem.  Skin: Negative.   Allergic/Immunologic: Negative.   Neurological: Positive for numbness.  Hematological: Negative.   Psychiatric/Behavioral: Negative.        Objective:   Physical Exam Constitutional: No distress.   Respiratory: Normal effort.   Psych: Normal mood.  Normal behavior. 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  1.  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               Cont HH PT/OT/SLP  Follow up Neurology - needs appointment, reminded again  2.  Essential hypertension:   Relatively controlled  Cont meds  3. Gait abnormality  Cont therapies   Continue walker/wheelchair for safety

## 2019-08-17 DIAGNOSIS — I69322 Dysarthria following cerebral infarction: Secondary | ICD-10-CM | POA: Diagnosis not present

## 2019-08-17 DIAGNOSIS — M1990 Unspecified osteoarthritis, unspecified site: Secondary | ICD-10-CM | POA: Diagnosis not present

## 2019-08-17 DIAGNOSIS — Z7902 Long term (current) use of antithrombotics/antiplatelets: Secondary | ICD-10-CM | POA: Diagnosis not present

## 2019-08-17 DIAGNOSIS — Z48812 Encounter for surgical aftercare following surgery on the circulatory system: Secondary | ICD-10-CM | POA: Diagnosis not present

## 2019-08-17 DIAGNOSIS — I10 Essential (primary) hypertension: Secondary | ICD-10-CM | POA: Diagnosis not present

## 2019-08-17 DIAGNOSIS — Z8701 Personal history of pneumonia (recurrent): Secondary | ICD-10-CM | POA: Diagnosis not present

## 2019-08-17 DIAGNOSIS — I69351 Hemiplegia and hemiparesis following cerebral infarction affecting right dominant side: Secondary | ICD-10-CM | POA: Diagnosis not present

## 2019-08-17 DIAGNOSIS — E039 Hypothyroidism, unspecified: Secondary | ICD-10-CM | POA: Diagnosis not present

## 2019-08-17 DIAGNOSIS — J309 Allergic rhinitis, unspecified: Secondary | ICD-10-CM | POA: Diagnosis not present

## 2019-08-17 DIAGNOSIS — Z87891 Personal history of nicotine dependence: Secondary | ICD-10-CM | POA: Diagnosis not present

## 2019-08-17 DIAGNOSIS — Z7982 Long term (current) use of aspirin: Secondary | ICD-10-CM | POA: Diagnosis not present

## 2019-08-17 DIAGNOSIS — K219 Gastro-esophageal reflux disease without esophagitis: Secondary | ICD-10-CM | POA: Diagnosis not present

## 2019-08-17 DIAGNOSIS — Z955 Presence of coronary angioplasty implant and graft: Secondary | ICD-10-CM | POA: Diagnosis not present

## 2019-08-20 DIAGNOSIS — E039 Hypothyroidism, unspecified: Secondary | ICD-10-CM | POA: Diagnosis not present

## 2019-08-20 DIAGNOSIS — Z955 Presence of coronary angioplasty implant and graft: Secondary | ICD-10-CM | POA: Diagnosis not present

## 2019-08-20 DIAGNOSIS — Z48812 Encounter for surgical aftercare following surgery on the circulatory system: Secondary | ICD-10-CM | POA: Diagnosis not present

## 2019-08-20 DIAGNOSIS — Z7982 Long term (current) use of aspirin: Secondary | ICD-10-CM | POA: Diagnosis not present

## 2019-08-20 DIAGNOSIS — J309 Allergic rhinitis, unspecified: Secondary | ICD-10-CM | POA: Diagnosis not present

## 2019-08-20 DIAGNOSIS — Z87891 Personal history of nicotine dependence: Secondary | ICD-10-CM | POA: Diagnosis not present

## 2019-08-20 DIAGNOSIS — K219 Gastro-esophageal reflux disease without esophagitis: Secondary | ICD-10-CM | POA: Diagnosis not present

## 2019-08-20 DIAGNOSIS — M1990 Unspecified osteoarthritis, unspecified site: Secondary | ICD-10-CM | POA: Diagnosis not present

## 2019-08-20 DIAGNOSIS — I69351 Hemiplegia and hemiparesis following cerebral infarction affecting right dominant side: Secondary | ICD-10-CM | POA: Diagnosis not present

## 2019-08-20 DIAGNOSIS — Z8701 Personal history of pneumonia (recurrent): Secondary | ICD-10-CM | POA: Diagnosis not present

## 2019-08-20 DIAGNOSIS — Z7902 Long term (current) use of antithrombotics/antiplatelets: Secondary | ICD-10-CM | POA: Diagnosis not present

## 2019-08-20 DIAGNOSIS — I10 Essential (primary) hypertension: Secondary | ICD-10-CM | POA: Diagnosis not present

## 2019-08-20 DIAGNOSIS — I69322 Dysarthria following cerebral infarction: Secondary | ICD-10-CM | POA: Diagnosis not present

## 2019-08-22 DIAGNOSIS — Z8701 Personal history of pneumonia (recurrent): Secondary | ICD-10-CM | POA: Diagnosis not present

## 2019-08-22 DIAGNOSIS — I69351 Hemiplegia and hemiparesis following cerebral infarction affecting right dominant side: Secondary | ICD-10-CM | POA: Diagnosis not present

## 2019-08-22 DIAGNOSIS — Z7982 Long term (current) use of aspirin: Secondary | ICD-10-CM | POA: Diagnosis not present

## 2019-08-22 DIAGNOSIS — M1990 Unspecified osteoarthritis, unspecified site: Secondary | ICD-10-CM | POA: Diagnosis not present

## 2019-08-22 DIAGNOSIS — E039 Hypothyroidism, unspecified: Secondary | ICD-10-CM | POA: Diagnosis not present

## 2019-08-22 DIAGNOSIS — I69322 Dysarthria following cerebral infarction: Secondary | ICD-10-CM | POA: Diagnosis not present

## 2019-08-22 DIAGNOSIS — Z7902 Long term (current) use of antithrombotics/antiplatelets: Secondary | ICD-10-CM | POA: Diagnosis not present

## 2019-08-22 DIAGNOSIS — Z87891 Personal history of nicotine dependence: Secondary | ICD-10-CM | POA: Diagnosis not present

## 2019-08-22 DIAGNOSIS — Z955 Presence of coronary angioplasty implant and graft: Secondary | ICD-10-CM | POA: Diagnosis not present

## 2019-08-22 DIAGNOSIS — Z48812 Encounter for surgical aftercare following surgery on the circulatory system: Secondary | ICD-10-CM | POA: Diagnosis not present

## 2019-08-22 DIAGNOSIS — I10 Essential (primary) hypertension: Secondary | ICD-10-CM | POA: Diagnosis not present

## 2019-08-22 DIAGNOSIS — K219 Gastro-esophageal reflux disease without esophagitis: Secondary | ICD-10-CM | POA: Diagnosis not present

## 2019-08-22 DIAGNOSIS — J309 Allergic rhinitis, unspecified: Secondary | ICD-10-CM | POA: Diagnosis not present

## 2019-08-23 DIAGNOSIS — E039 Hypothyroidism, unspecified: Secondary | ICD-10-CM | POA: Diagnosis not present

## 2019-08-23 DIAGNOSIS — Z7902 Long term (current) use of antithrombotics/antiplatelets: Secondary | ICD-10-CM | POA: Diagnosis not present

## 2019-08-23 DIAGNOSIS — Z48812 Encounter for surgical aftercare following surgery on the circulatory system: Secondary | ICD-10-CM | POA: Diagnosis not present

## 2019-08-23 DIAGNOSIS — M1990 Unspecified osteoarthritis, unspecified site: Secondary | ICD-10-CM | POA: Diagnosis not present

## 2019-08-23 DIAGNOSIS — I69322 Dysarthria following cerebral infarction: Secondary | ICD-10-CM | POA: Diagnosis not present

## 2019-08-23 DIAGNOSIS — Z7982 Long term (current) use of aspirin: Secondary | ICD-10-CM | POA: Diagnosis not present

## 2019-08-23 DIAGNOSIS — J309 Allergic rhinitis, unspecified: Secondary | ICD-10-CM | POA: Diagnosis not present

## 2019-08-23 DIAGNOSIS — Z8701 Personal history of pneumonia (recurrent): Secondary | ICD-10-CM | POA: Diagnosis not present

## 2019-08-23 DIAGNOSIS — I69351 Hemiplegia and hemiparesis following cerebral infarction affecting right dominant side: Secondary | ICD-10-CM | POA: Diagnosis not present

## 2019-08-23 DIAGNOSIS — I10 Essential (primary) hypertension: Secondary | ICD-10-CM | POA: Diagnosis not present

## 2019-08-23 DIAGNOSIS — Z955 Presence of coronary angioplasty implant and graft: Secondary | ICD-10-CM | POA: Diagnosis not present

## 2019-08-23 DIAGNOSIS — Z87891 Personal history of nicotine dependence: Secondary | ICD-10-CM | POA: Diagnosis not present

## 2019-08-23 DIAGNOSIS — K219 Gastro-esophageal reflux disease without esophagitis: Secondary | ICD-10-CM | POA: Diagnosis not present

## 2019-08-28 ENCOUNTER — Ambulatory Visit: Payer: PPO | Admitting: Adult Health

## 2019-08-28 ENCOUNTER — Encounter: Payer: Self-pay | Admitting: Adult Health

## 2019-08-28 ENCOUNTER — Other Ambulatory Visit: Payer: Self-pay

## 2019-08-28 VITALS — BP 142/86 | HR 78 | Temp 97.9°F | Ht 64.0 in | Wt 155.0 lb

## 2019-08-28 DIAGNOSIS — I69351 Hemiplegia and hemiparesis following cerebral infarction affecting right dominant side: Secondary | ICD-10-CM | POA: Diagnosis not present

## 2019-08-28 DIAGNOSIS — I10 Essential (primary) hypertension: Secondary | ICD-10-CM

## 2019-08-28 DIAGNOSIS — I6322 Cerebral infarction due to unspecified occlusion or stenosis of basilar arteries: Secondary | ICD-10-CM

## 2019-08-28 DIAGNOSIS — E785 Hyperlipidemia, unspecified: Secondary | ICD-10-CM

## 2019-08-28 NOTE — Patient Instructions (Signed)
Referral placed to outpatient physical and occupational therapy for hopeful ongoing improvement   Continue aspirin 325 mg daily and clopidogrel 75 mg daily  and atorvastatin for secondary stroke prevention  Follow-up with Dr. Estanislado Pandy 4 months post procedure for repeat imaging and surveillance monitoring  Continue to follow up with PCP regarding cholesterol and blood pressure management   Continue to monitor blood pressure at home  Maintain strict control of hypertension with blood pressure goal below 130/90, diabetes with hemoglobin A1c goal below 6.5% and cholesterol with LDL cholesterol (bad cholesterol) goal below 70 mg/dL. I also advised the patient to eat a healthy diet with plenty of whole grains, cereals, fruits and vegetables, exercise regularly and maintain ideal body weight.  Followup in the future with me in 3 months or call earlier if needed       Thank you for coming to see [place] at [facility]. I hope we have been able to provide you high quality care today.  You may receive a patient satisfaction survey over the next few weeks. We would appreciate your feedback and comments so that we may continue to improve ourselves and the health of our patients.

## 2019-08-28 NOTE — Progress Notes (Signed)
I agree with the above plan 

## 2019-08-28 NOTE — Progress Notes (Signed)
[facility] [address] [place]. [place] [address] [phone]       HOSPITAL FOLLOW UP NOTE  Megan Martinez Payor Date of Birth:  [birth year] Medical Record Number:  [id number]   Reason for Referral:  hospital stroke follow up    CHIEF COMPLAINT:  Chief Complaint  Patient presents with  . Follow-up    RM 9, Hospital fu, with daughter     HPI:  MeganMarchia A Lawsonis a 69 y.o.femalewith history of HTN and thyroid disease who presented on 06/14/2019 with R sided weakness and dysarthria.  Evaluated by stroke team with stroke work-up revealing left pontine infarct s/p tPA (per Wake-up trial guidelines) in setting of BA stenosis, infarct secondary to large vessel disease.  Evaluated by IR Dr. Estanislado Pandy with plans on intervention on BA stent post rehab stay.  Recommended DAPT for 3 months then Plavix alone as previously on aspirin and due to intracranial stenosis.  HTN stable and recommended long-term BP goal 130-160 prior to BA intervention and avoidance of low blood pressure.  LDL 137 and initiated atorvastatin 80 mg daily.  No history of DM with A1c 5.8.  Other stroke risk factors include former tobacco use, and EtOH use but no prior history of stroke.  Residual deficits of mild dysarthria, mild facial droop, and right hemiparesis and discharged to CIR on 06/20/2019 for ongoing therapy needs.  She was discharged home on 07/05/2019.  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 ASPECTS 10.    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 Moderate B proximal PCA stenoses. L VA 50% stenosis w/ narrowing distal  V4  CT perfusion no infarct   MRI  L paramedian pontine infarct. High-grade mid BA stenosis.   CT at 24h low density L ponts. No hemorrhage   2D Echo EF 60-65%. No source of embolus    LDL 137  HgbA1c 5.8  lovenox for  VTE prophylaxis  aspirin 81 mg daily prior to admission, now on aspirin 325 mg daily, clopidogrel 75 mg daily following aspirin and Plavix load. Continue DAPT x 3 months then plavix alone for intracranial stenosis.  Therapy recommendations: CIR  Disposition:  CIR    Ms. [name] is being seen today, 08/28/2019, for hospital follow-up accompanied by her daughter.  She has been doing well since discharge with residual right hemiparesis, right facial droop and dysarthria.  She recently completed therapy and is interested in pursuing outpatient therapy.  Ambulatory with rolling walker but no AD in her home.  She does endorse right shoulder pain greater in deltoid and tricep but is hopeful ongoing improvement once initiating outpatient therapy.  She did undergo revascularization for BA stenosis using stent assisted angioplasty on 07/24/2019 by Dr. Estanislado Pandy without complication.  Plans on repeating CTA head/neck 4 months post procedure.  Continues on aspirin 325 mg daily and clopidogrel 75 mg daily without bleeding or bruising.  Continues on atorvastatin without myalgias.  Blood pressure today 142/86.  No further concerns at this time.     ROS:   14 system review of systems performed and negative with exception of weakness, gait impairment, pain and dysarthria  PMH:  Past Medical History:  Diagnosis Date  . Allergy   . Arthritis   . GERD (gastroesophageal reflux disease)   . Hypertension   . Hypothyroidism   . Pneumonia   .  Stroke [facility])    right arm is "not working", speech a little slurred  . Thyroid disease     PSH:  Past Surgical History:  Procedure Laterality Date  . ABDOMINAL HYSTERECTOMY    . APPENDECTOMY    . BREAST SURGERY Right    lumpectomy  . CATARACT EXTRACTION    . CHOLECYSTECTOMY    . IR ANGIO INTRA EXTRACRAN SEL COM CAROTID INNOMINATE BILAT MOD SED  07/24/2019  . IR ANGIO VERTEBRAL SEL SUBCLAVIAN INNOMINATE UNI L MOD SED  07/24/2019  . IR INTRA CRAN STENT  07/24/2019  .  RADIOLOGY WITH ANESTHESIA N/A 07/09/2019   Procedure: RADIOLOGY WITH ANESTHESIA STENT PLACEMENT;  Surgeon: Luanne Bras, MD;  Location: [place];  Service: Radiology;  Laterality: N/A;  . RADIOLOGY WITH ANESTHESIA N/A 07/23/2019   Procedure: RADIOLOGY WITH ANESTHESIA STENT PLACEMENT;  Surgeon: Luanne Bras, MD;  Location: [place];  Service: Radiology;  Laterality: N/A;  . RADIOLOGY WITH ANESTHESIA N/A 07/24/2019   Procedure: RADIOLOGY WITH ANESTHESIA STENT PLACEMENT;  Surgeon: Luanne Bras, MD;  Location: [place];  Service: Radiology;  Laterality: N/A;  . TUBAL LIGATION      Social History:  Social History   Socioeconomic History  . Marital status: Married    Spouse name: Not on file  . Number of children: Not on file  . Years of education: Not on file  . Highest education level: Not on file  Occupational History  . Not on file  Tobacco Use  . Smoking status: Former Smoker    Quit date: 1970    Years since quitting: 51.2  . Smokeless tobacco: Never Used  Substance and Sexual Activity  . Alcohol use: Yes    Comment: occasional  . Drug use: Never  . Sexual activity: Not on file  Other Topics Concern  . Not on file  Social History Narrative  . Not on file   Social Determinants of Health   Financial Resource Strain:   . Difficulty of Paying Living Expenses:   Food Insecurity:   . Worried About Charity fundraiser in the Last Year:   . Arboriculturist in the Last Year:   Transportation Needs:   . Film/video editor (Medical):   Marland Kitchen Lack of Transportation (Non-Medical):   Physical Activity:   . Days of Exercise per Week:   . Minutes of Exercise per Session:   Stress:   . Feeling of Stress :   Social Connections:   . Frequency of Communication with Friends and Family:   . Frequency of Social Gatherings with Friends and Family:   . Attends Religious Services:   . Active Member of Clubs or Organizations:   . Attends Archivist Meetings:   Marland Kitchen Marital  Status:   Intimate Partner Violence:   . Fear of Current or Ex-Partner:   . Emotionally Abused:   Marland Kitchen Physically Abused:   . Sexually Abused:     Family History: No family history on file.  Medications:   Current Outpatient Medications on File Prior to Visit  Medication Sig Dispense Refill  . acetaminophen (TYLENOL) 325 MG tablet Take 2 tablets (650 mg total) by mouth every 4 (four) hours as needed for mild pain (or temp > 37.5 C (99.5 F)).    Marland Kitchen amLODipine (NORVASC) 10 MG tablet Take 1 tablet (10 mg total) by mouth daily. 90 tablet 3  . aspirin EC 325 MG EC tablet Take 1 tablet (325 mg total) by mouth daily.  30 tablet 0  . atorvastatin (LIPITOR) 80 MG tablet Take 1 tablet (80 mg total) by mouth daily at 6 PM. 30 tablet 1  . chlorthalidone (HYGROTON) 25 MG tablet Take 1 tablet (25 mg total) by mouth daily. 90 tablet 1  . clopidogrel (PLAVIX) 75 MG tablet Take 1 tablet (75 mg total) by mouth daily. 30 tablet 1  . fluticasone (FLONASE) 50 MCG/ACT nasal spray Place 2 sprays into both nostrils daily. (Patient taking differently: Place 2 sprays into both nostrils daily as needed for allergies. ) 16 g 6  . levocetirizine (XYZAL) 5 MG tablet Take 5 mg by mouth daily.    Marland Kitchen levothyroxine (SYNTHROID) 137 MCG tablet Take 1 tablet (137 mcg total) by mouth daily at 6 (six) AM. 30 tablet 1  . pantoprazole (PROTONIX) 40 MG tablet Take 1 tablet (40 mg total) by mouth at bedtime. 30 tablet 0   Current Facility-Administered Medications on File Prior to Visit  Medication Dose Route Frequency Provider Last Rate Last Admin  . midazolam (VERSED) 5 MG/5ML injection   Intravenous Anesthesia Intra-op Mariea Clonts, CRNA   1 mg at 07/09/19 0845    Allergies:   Allergies  Allergen Reactions  . Latex Itching and Rash  . Statins Other (See Comments)    Myalgias, "sick"   . Amoxicillin Rash    Did it involve swelling of the face/tongue/throat, SOB, or low BP? No Did it involve sudden or severe rash/hives,  skin peeling, or any reaction on the inside of your mouth or nose? Yes Did you need to seek medical attention at a hospital or doctor's office? N/A When did it last happen? Child If all above answers are "NO", may proceed with cephalosporin use.  . Codeine Nausea Only  . Penicillin G Rash    Did it involve swelling of the face/tongue/throat, SOB, or low BP? No Did it involve sudden or severe rash/hives, skin peeling, or any reaction on the inside of your mouth or nose? Yes Did you need to seek medical attention at a hospital or doctor's office? N/A When did it last happen?Child If all above answers are "NO", may proceed with cephalosporin use.     Physical Exam  Vitals:   08/28/19 1050  BP: (!) 142/86  Pulse: 78  Temp: 97.9 F (36.6 C)  Weight: 155 lb (70.3 kg)  Height: 5\' 4"  (1.626 m)   Body mass index is 26.61 kg/m. No exam data present  General: well developed, well nourished, pleasant middle aged 64 female, seated, in no evident distress Head: head normocephalic and atraumatic.   Neck: supple with no carotid or supraclavicular bruits Cardiovascular: regular rate and rhythm, no murmurs Musculoskeletal: no deformity Skin:  no rash/petichiae Vascular:  Normal pulses all extremities   Neurologic Exam Mental Status: Awake and fully alert.   Mild dysarthria.  Oriented to place and time. Recent and remote memory intact. Attention span, concentration and fund of knowledge appropriate. Mood and affect appropriate.  Cranial Nerves: Fundoscopic exam reveals sharp disc margins. Pupils equal, briskly reactive to light. Extraocular movements full without nystagmus. Visual fields full to confrontation. Hearing intact. Facial sensation intact.  Right lower facial weakness.  Tongue, and palate moves normally and symmetrically.  Motor: Full strength left upper and lower extremity RUE: 4/5 deltoid with increased tone (full ROM with passive movement is slightly increased  pain/tightness), 3/5 elbow extension and flexion, weak grip strength with increased tone RLE: 4/5 hip flexor, knee extension and flexion; foot drop currently  in brace Sensory.: intact to touch , pinprick , position and vibratory sensation.  Coordination: Rapid alternating movements normal in all extremities except diminished right hand. Finger-to-nose and heel-to-shin performed accurately on left side Gait and Station: Arises from chair without difficulty. Stance is normal.  Mild hemiplegic right-sided gait with use of rolling walker; no balance impairment identified Reflexes: 1+ and symmetric. Toes downgoing.     NIHSS 4 1a.  Level of consciousness 0 1b. LOC questions 0 1c. LOC commands 0 2.  Best gaze 0 3.  Visual 0 4.  Facial palsy 1 5a.  Motor arm-left 0 5b.  Motor arm-right 1 6a.  Motor leg-left 0 6b.  Motor leg-right 1 7.  Limb ataxia 0 8.  Sensory 0 9.  Best language 0 10.  Dysarthria 1 11.  Extinction and inattention 0 Modified Rankin  3       ASSESSMENT: Megan Martinez is a 69 y.o. 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  On 07/24/2019, s/p revascularization of basilar artery stenosis using stent assisted angioplasty by Dr. Estanislado Pandy patient.  Vascular risk factors include HTN, HLD, BA stenosis, former tobacco use and EtOH use.  Has been recovering well with residual right hemiparesis, right facial droop and dysarthria    PLAN:  1. Left pontine stroke:  -Right hemiparesis: Referral placed to outpatient PT/OT at [facility] as requested by patient for hopeful ongoing improvement of residual deficits.  Right shoulder pain likely secondary to weakness and advised to f/u with Dr. Posey Pronto if pain continues despite therapy for possible further intervention.  Also discussed importance of ensuring adequate position and ROM  exercises -Continue aspirin 325 mg daily and clopidogrel 75 mg daily  and atorvastatin 80 mg daily for secondary stroke prevention.  -Maintain strict control of hypertension with blood pressure goal below 130/90, diabetes with hemoglobin A1c goal below 6.5% and cholesterol with LDL cholesterol (bad cholesterol) goal below 70 mg/dL.  I also advised the patient to eat a healthy diet with plenty of whole grains, cereals, fruits and vegetables, exercise regularly with at least 30 minutes of continuous activity daily and maintain ideal body weight. 2. BA stenosis s/p stent placement:  -f/u with Dr. Estanislado Pandy for repeat imaging/surveillance monitoring 4 months post procedure -Continuation of DAPT and statin with ongoing DAPT duration determined by IR 3. HTN: Stable.  Ongoing monitoring management by PCP 4. HLD: Continuation of atorvastatin 80 mg daily and ongoing monitoring management by PCP     Follow up in 3 months or call earlier if needed   I spent 50 minutes of face-to-face and non-face-to-face time with patient and daughter.  This included previsit chart review, lab review, study review, order entry, electronic health record documentation, patient education regarding recent stroke, residual deficits, BA stenosis s/p stent placement, importance of ongoing management of secondary stroke risk factors and answered all questions to patient and daughter satisfaction    Frann Rider, AGNP-BC  [facility] [address] [place], [place] [address]  Phone [phone] Fax [phone] Note: This document was prepared with digital dictation and possible smart phrase technology. Any transcriptional errors that result from this process are unintentional.

## 2019-08-31 ENCOUNTER — Other Ambulatory Visit: Payer: Self-pay | Admitting: Family Medicine

## 2019-08-31 NOTE — Telephone Encounter (Signed)
Last OV 08/07/19 Next OV 09/07/2019

## 2019-09-03 ENCOUNTER — Telehealth: Payer: Self-pay | Admitting: Adult Health

## 2019-09-03 NOTE — Telephone Encounter (Signed)
Pt called wanting to make sure that her PT referral is sent to [facility]. Please advise.

## 2019-09-05 NOTE — Telephone Encounter (Signed)
Noted Yes and called and spoke to patient .

## 2019-09-06 ENCOUNTER — Other Ambulatory Visit: Payer: Self-pay

## 2019-09-06 ENCOUNTER — Telehealth: Payer: Self-pay | Admitting: Family Medicine

## 2019-09-06 DIAGNOSIS — I10 Essential (primary) hypertension: Secondary | ICD-10-CM

## 2019-09-06 MED ORDER — ATORVASTATIN CALCIUM 80 MG PO TABS: 80.0000 mg | ORAL_TABLET | Freq: Every day | ORAL | 1 refills | Status: DC

## 2019-09-06 MED ORDER — AMLODIPINE BESYLATE 10 MG PO TABS: 10.0000 mg | ORAL_TABLET | Freq: Every day | ORAL | 1 refills | Status: DC

## 2019-09-06 NOTE — Telephone Encounter (Addendum)
Patient is calling and requesting a refill for amlodipine and atorvastatin sent to [facility] in [place]. Also patient wanted to speak to someone about getting a letter approved for therapy. CB is [phone]

## 2019-09-06 NOTE — Telephone Encounter (Signed)
Returned patients call no answer LMTCB, refills sent to pharmacy.

## 2019-09-07 ENCOUNTER — Ambulatory Visit: Payer: PPO | Admitting: Family Medicine

## 2019-09-08 DIAGNOSIS — I69351 Hemiplegia and hemiparesis following cerebral infarction affecting right dominant side: Secondary | ICD-10-CM | POA: Diagnosis not present

## 2019-09-17 ENCOUNTER — Encounter: Payer: Self-pay | Admitting: Physical Therapy

## 2019-09-17 ENCOUNTER — Ambulatory Visit: Payer: PPO | Attending: Adult Health | Admitting: Physical Therapy

## 2019-09-17 ENCOUNTER — Other Ambulatory Visit: Payer: Self-pay

## 2019-09-17 DIAGNOSIS — R260 Ataxic gait: Secondary | ICD-10-CM

## 2019-09-17 DIAGNOSIS — M6281 Muscle weakness (generalized): Secondary | ICD-10-CM | POA: Insufficient documentation

## 2019-09-17 DIAGNOSIS — I69351 Hemiplegia and hemiparesis following cerebral infarction affecting right dominant side: Secondary | ICD-10-CM | POA: Insufficient documentation

## 2019-09-17 DIAGNOSIS — R262 Difficulty in walking, not elsewhere classified: Secondary | ICD-10-CM | POA: Diagnosis not present

## 2019-09-17 NOTE — Therapy (Signed)
[address] [place], [place], [address] Phone: [phone]   Fax:  [phone]  Physical Therapy Evaluation  Patient Details  Name: Megan Martinez MRN: [id number] Date of Birth: 1950/06/10 Referring Provider (PT): Princella Ion   Encounter Date: 09/17/2019  PT End of Session - 09/17/19 0923    Visit Number  1    Date for PT Re-Evaluation  11/17/19    PT Start Time  0830    PT Stop Time  0920    PT Time Calculation (min)  50 min    Activity Tolerance  Patient tolerated treatment well    Behavior During Therapy  [facility] for tasks assessed/performed       Past Medical History:  Diagnosis Date  . Allergy   . Arthritis   . GERD (gastroesophageal reflux disease)   . Hypertension   . Hypothyroidism   . Pneumonia   . Stroke [facility])    right arm is "not working", speech a little slurred  . Thyroid disease     Past Surgical History:  Procedure Laterality Date  . ABDOMINAL HYSTERECTOMY    . APPENDECTOMY    . BREAST SURGERY Right    lumpectomy  . CATARACT EXTRACTION    . CHOLECYSTECTOMY    . IR ANGIO INTRA EXTRACRAN SEL COM CAROTID INNOMINATE BILAT MOD SED  07/24/2019  . IR ANGIO VERTEBRAL SEL SUBCLAVIAN INNOMINATE UNI L MOD SED  07/24/2019  . IR INTRA CRAN STENT  07/24/2019  . RADIOLOGY WITH ANESTHESIA N/A 07/09/2019   Procedure: RADIOLOGY WITH ANESTHESIA STENT PLACEMENT;  Surgeon: Luanne Bras, MD;  Location: [place];  Service: Radiology;  Laterality: N/A;  . RADIOLOGY WITH ANESTHESIA N/A 07/23/2019   Procedure: RADIOLOGY WITH ANESTHESIA STENT PLACEMENT;  Surgeon: Luanne Bras, MD;  Location: [place];  Service: Radiology;  Laterality: N/A;  . RADIOLOGY WITH ANESTHESIA N/A 07/24/2019   Procedure: RADIOLOGY WITH ANESTHESIA STENT PLACEMENT;  Surgeon: Luanne Bras, MD;  Location: [place];  Service: Radiology;  Laterality: N/A;  . TUBAL LIGATION      There were no vitals filed for this  visit.   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  She reports prior to the stroke she was healthy and working two jobs.  She was in the hospital for a month.  She tehn had some Home PT.  She is now referred for PT, she has an order for OT but reports to me that she does not want to do that right now as she is taking care of herself.  She reports that she has had some falls since she has been home.    Pertinent History  CVA, HTN, GERD    Limitations  Walking;Standing;Writing;House hold activities;Reading;Lifting    Patient Stated Goals  walk better, be stronger and more stable    Currently in Pain?  Yes    Pain Score  2     Pain Location  Shoulder    Pain Orientation  Right    Pain Descriptors / Indicators  Aching;Sharp    Pain Type  Acute pain    Pain Onset  More than a month ago    Pain Frequency  Intermittent    Aggravating Factors   movements of the right shoulder cause pain up to 6/10    Pain Relieving Factors  not moving the right shoulder has no pain  Effect of Pain on Daily Activities  limits some activities         [facility] PT Assessment - 09/17/19 0001      Assessment   Medical Diagnosis  CVA with right hemiparesis    Referring Provider (PT)  Janett [facility]    Onset Date/Surgical Date  06/14/19    Hand Dominance  Right    Prior Therapy  at hospital and at home      Precautions   Precautions  Fall      Balance Screen   Has the patient fallen in the past 6 months  Yes    How many times?  1    Has the patient had a decrease in activity level because of a fear of falling?   Yes    Is the patient reluctant to leave their home because of a fear of falling?   No      Home Environment   Additional Comments  laundry is in the basement, but just had  stair lift installed.  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    Leisure  no exercises      Lawyer Test  unable with the right      ROM / Strength   AROM / PROM / Strength  AROM;Strength      AROM   Overall AROM Comments  fingers and hand are very weak and limited motions    AROM Assessment Site  Shoulder;Knee;Ankle;Wrist    Right/Left Shoulder  --    Right Shoulder Flexion  80 Degrees    Right Shoulder ABduction  60 Degrees    Right Shoulder Internal Rotation  30 Degrees    Right Shoulder External Rotation  40 Degrees    Right/Left Wrist  Right    Right Wrist Extension  0 Degrees    Right Wrist Flexion  30 Degrees    Right/Left Knee  Right    Right Knee Extension  5    Right Knee Flexion  110    Right/Left Ankle  Right    Right Ankle Dorsiflexion  0    Right Ankle Inversion  0    Right Ankle Eversion  0      Strength   Overall Strength Comments  right hip 3+/5, right knee 4-/5, ankle 2/5, right shoulder 2/5, left grip 45#, 5#      Ambulation/Gait   Gait Comments  uses a SBQC, it is backard the feet are in toward her, ataxic movements of the right LE      Standardized Balance Assessment   Standardized Balance Assessment  Timed Up and Go Test;Berg Balance Test      Berg Balance Test   Sit to Stand  Able to stand  independently using hands    Standing Unsupported  Able to stand safely 2 minutes    Sitting with Back Unsupported but Feet Supported on Floor or Stool  Able to sit safely and securely 2 minutes    Stand to Sit  Sits safely with minimal use of hands    Transfers  Able to transfer safely, minor use of hands    Standing Unsupported with Eyes Closed  Able to stand 10  seconds safely    Standing Unsupported with Feet Together  Able to place feet together independently and stand 1 minute safely    From Standing, Reach Forward with Outstretched Arm  Can reach forward  >12 cm safely (5")    From Standing Position, Pick up Object from [place] to pick up shoe safely and easily    From Standing Position, Turn to Look Behind Over each Shoulder  Looks behind one side only/other side shows less weight shift    Turn 360 Degrees  Able to turn 360 degrees safely in 4 seconds or less    Standing Unsupported, Alternately Place Feet on Step/Stool  Able to stand independently and complete 8 steps >20 seconds    Standing Unsupported, One Foot in Front  Able to plae foot ahead of the other independently and hold 30 seconds    Standing on One Leg  Able to lift leg independently and hold equal to or more than 3 seconds    Total Score  49      Timed Up and Go Test   Normal TUG (seconds)  21                Objective measurements completed on examination: See above findings.      [facility] Adult PT Treatment/Exercise - 09/17/19 0001      Exercises   Exercises  Knee/Hip      Knee/Hip Exercises: Aerobic   Nustep  level 3 x 6 minutes               PT Short Term Goals - 09/17/19 1012      PT SHORT TERM GOAL #1   Title  Independent with initial HEP    Time  4    Period  Weeks    Status  New        PT Long Term Goals - 09/17/19 1012      PT LONG TERM GOAL #1   Title  decrease TUG to 13 seconds    Time  12    Period  Weeks    Status  New      PT LONG TERM GOAL #2   Title  walk without device on uneven surfaces safely    Time  12    Period  Weeks    Status  New      PT LONG TERM GOAL #3   Title  go up and down stairs step over step    Time  12    Period  Weeks    Status  New      PT LONG TERM GOAL #4   Title  increase right ankle DF to 5 degrees    Time  12    Period  Weeks    Status  New             Plan - 09/17/19 [id number]    Clinical Impression Statement  Patient had a CVA with right hemiparesis 06/14/19, she is right handed, she really is having diffiuclty with the right arm, she has an order for OT but reports that it  is too far to travel.  She is limited in her ROM, has an AFO on the right, she is limited in the ROM and strength of the irght arm, especially the fingers.  Uses a SBQC for gait .  Lives alone, needs to do her own hosuework and [employer], was working two jobs.    Personal Factors and Comorbidities  Comorbidity 3+    Comorbidities  CVA, HTN, GERD    Examination-Activity Limitations  Bathing;Self Feeding;Carry;Squat;Stairs;Dressing;Stand;Toileting;Hygiene/Grooming;Lift;Transfers;Locomotion Level    Examination-Participation Restrictions  Cleaning;Meal Prep;Driving;Shop;Laundry;Yard Work    Stability/Clinical Decision Making  Evolving/Moderate complexity    Clinical Decision Making  Moderate    Rehab Potential  Good    PT Frequency  2x / week    PT Duration  12 weeks    PT Treatment/Interventions  ADLs/Self Care Home Management;Gait training;Neuromuscular re-education;Balance training;Therapeutic exercise;Therapeutic activities;Functional mobility training;Stair training;Patient/family education;Manual techniques    PT Next Visit Plan  work on ROM. strength and function    Consulted and Agree with Plan of Care  Patient       Patient will benefit from skilled therapeutic intervention in order to improve the following deficits and impairments:  Abnormal gait, Decreased coordination, Decreased range of motion, Difficulty walking, Decreased endurance, Cardiopulmonary status limiting activity, Decreased activity tolerance, Decreased balance, Impaired flexibility, Improper body mechanics, Decreased mobility, Decreased strength, Postural dysfunction  Visit Diagnosis: Difficulty in walking, not elsewhere classified - Plan: PT plan of care cert/re-cert  Muscle weakness (generalized) - Plan: PT plan of care cert/re-cert  Ataxic gait - Plan: PT plan of care cert/re-cert  Hemiplegia and hemiparesis following cerebral infarction affecting right dominant side ([place]) - Plan: PT plan of care  cert/re-cert     Problem List Patient Active Problem List   Diagnosis Date Noted  . Cerebrovascular accident (CVA) due to stenosis of basilar artery ([place]) 08/16/2019  . Elevated LDL cholesterol level 08/07/2019  . Type 2 diabetes mellitus with hyperglycemia, without long-term current use of insulin ([place]) 08/07/2019  . H/O cerebral artery stenosis 07/24/2019  . 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 Abnormality of gait 07/19/2019  . Right wrist drop   . Right foot drop   . Hypokalemia   . Stenosis of artery ([place])   . Benign essential HTN   . Stage 2 chronic kidney disease   . Prediabetes   . Transaminitis   . Hypoalbuminemia due to protein-calorie malnutrition ([place])   . Labile blood pressure   . Left pontine cerebrovascular accident ([place] ) 06/20/2019  . Acute ischemic stroke ([place])   . Right hemiparesis ([place])   . Dysphagia, post-stroke   . 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 Stroke (cerebrum) ([place]) L pontine d/t BA stenosis 06/14/2019  . Subcutaneous nodule of left foot 01/03/2019  . Atopic dermatitis 10/18/2018  . Seasonal allergies 08/09/2018  . 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 Osteoarthritis of carpometacarpal (CMC) joint of thumb 11/07/2017  . Acquired hypothyroidism 08/04/2015  . Essential hypertension 08/04/2015  . GERD without esophagitis 08/04/2015  . Mixed hyperlipidemia 08/04/2015  . Osteopenia 08/04/2015  . Primary insomnia 08/04/2015  . Pure hypertriglyceridemia 08/04/2015    Sumner Boast., PT 09/17/2019, 10:45 AM  [facility] [address] [place], [place], [address] Phone: [phone]   Fax:  [phone]  Name: Megan Martinez MRN: [id number] Date of Birth: 02-03-1951

## 2019-09-20 ENCOUNTER — Ambulatory Visit: Payer: PPO | Admitting: Physical Therapy

## 2019-09-20 ENCOUNTER — Encounter: Payer: Self-pay | Admitting: Physical Therapy

## 2019-09-20 ENCOUNTER — Other Ambulatory Visit: Payer: Self-pay

## 2019-09-20 DIAGNOSIS — R262 Difficulty in walking, not elsewhere classified: Secondary | ICD-10-CM | POA: Diagnosis not present

## 2019-09-20 DIAGNOSIS — M6281 Muscle weakness (generalized): Secondary | ICD-10-CM

## 2019-09-20 DIAGNOSIS — I69351 Hemiplegia and hemiparesis following cerebral infarction affecting right dominant side: Secondary | ICD-10-CM

## 2019-09-20 DIAGNOSIS — R260 Ataxic gait: Secondary | ICD-10-CM

## 2019-09-20 NOTE — Therapy (Signed)
[address] [place], [place], [address] Phone: [phone]   Fax:  [phone]  Physical Therapy Treatment  Patient Details  Name: Megan Martinez MRN: [id number] Date of Birth: Oct 29, 1950 Referring Provider (PT): Princella Ion   Encounter Date: 09/20/2019  PT End of Session - 09/20/19 0942    Visit Number  2    Date for PT Re-Evaluation  11/17/19    PT Start Time  0845    PT Stop Time  0932    PT Time Calculation (min)  47 min    Activity Tolerance  Patient tolerated treatment well    Behavior During Therapy  [facility] for tasks assessed/performed       Past Medical History:  Diagnosis Date  . Allergy   . Arthritis   . GERD (gastroesophageal reflux disease)   . Hypertension   . Hypothyroidism   . Pneumonia   . Stroke [facility])    right arm is "not working", speech a little slurred  . Thyroid disease     Past Surgical History:  Procedure Laterality Date  . ABDOMINAL HYSTERECTOMY    . APPENDECTOMY    . BREAST SURGERY Right    lumpectomy  . CATARACT EXTRACTION    . CHOLECYSTECTOMY    . IR ANGIO INTRA EXTRACRAN SEL COM CAROTID INNOMINATE BILAT MOD SED  07/24/2019  . IR ANGIO VERTEBRAL SEL SUBCLAVIAN INNOMINATE UNI L MOD SED  07/24/2019  . IR INTRA CRAN STENT  07/24/2019  . RADIOLOGY WITH ANESTHESIA N/A 07/09/2019   Procedure: RADIOLOGY WITH ANESTHESIA STENT PLACEMENT;  Surgeon: Luanne Bras, MD;  Location: [place];  Service: Radiology;  Laterality: N/A;  . RADIOLOGY WITH ANESTHESIA N/A 07/23/2019   Procedure: RADIOLOGY WITH ANESTHESIA STENT PLACEMENT;  Surgeon: Luanne Bras, MD;  Location: [place];  Service: Radiology;  Laterality: N/A;  . RADIOLOGY WITH ANESTHESIA N/A 07/24/2019   Procedure: RADIOLOGY WITH ANESTHESIA STENT PLACEMENT;  Surgeon: Luanne Bras, MD;  Location: [place];  Service: Radiology;  Laterality: N/A;  . TUBAL LIGATION      There were no vitals filed for this visit.  Subjective  Assessment - 09/20/19 0845    Subjective  Pt reports that she is doing exercises prescribed by prior rehab and that she is walking everyday with her quad cane.    Pertinent History  CVA, HTN, GERD    Patient Stated Goals  walk better, be stronger and more stable    Currently in Pain?  No/denies    Pain Score  0-No pain                       [facility] Adult PT Treatment/Exercise - 09/20/19 0001      Ambulation/Gait   Ambulation/Gait  Yes    Ambulation/Gait Assistance  6: Modified independent (Device/Increase time)    Ambulation Distance (Feet)  300 Feet    Assistive device  Small based quad cane    Gait Pattern  Step-through pattern;Decreased arm swing - right;Decreased step length - left;Decreased stance time - right;Decreased hip/knee flexion - right;Decreased dorsiflexion - right;Decreased weight shift to right;Poor foot clearance - right    Ambulation Surface  Level      Exercises   Exercises  Hand;Elbow      Elbow Exercises   Forearm Pronation  Right;10 reps;Standing   with velcro roller standing at counter     Knee/Hip Exercises: Stretches   Gastroc Stretch  Right;2 reps;30 seconds  Gastroc Stretch Limitations  standing      Knee/Hip Exercises: Aerobic   Nustep  level 4 x 6 inutes      Knee/Hip Exercises: Seated   Sit to Sand  10 reps;without UE support   cues for eccentric control     Hand Exercises   Other Hand Exercises  grip objects and lift to cabinet with mod A from PT for shoulder elevation    Other Hand Exercises  ball squeeze with R hand while standing in tandem at counter               PT Short Term Goals - 09/17/19 1012      PT SHORT TERM GOAL #1   Title  Independent with initial HEP    Time  4    Period  Weeks    Status  New        PT Long Term Goals - 09/17/19 1012      PT LONG TERM GOAL #1   Title  decrease TUG to 13 seconds    Time  12    Period  Weeks    Status  New      PT LONG TERM GOAL #2   Title  walk without  device on uneven surfaces safely    Time  12    Period  Weeks    Status  New      PT LONG TERM GOAL #3   Title  go up and down stairs step over step    Time  12    Period  Weeks    Status  New      PT LONG TERM GOAL #4   Title  increase right ankle DF to 5 degrees    Time  12    Period  Weeks    Status  New            Plan - 09/20/19 [id number]    Clinical Impression Statement  Pt tolerated progression to TE well. Pt was CGA-minA for ambulation activities; only experiences LOB with turning. Pt demonstrated increased gait velocity and difficulty with AD sequencing with SBQC; trialed SPC with improved ability to sequence LE with AD. Pt demonstrates decreased arm swing RUE with gait. Pt required modA for shoulder/hand activities; R UE demonstrating some in synergy movement patterns. Added gastroc stretch and sit to stand without UE support to HEP. Continue to progress LE strengthening/flexibility. Incorporate UE ex's and balance ex's.    Comorbidities  CVA, HTN, GERD    PT Treatment/Interventions  ADLs/Self Care Home Management;Gait training;Neuromuscular re-education;Balance training;Therapeutic exercise;Therapeutic activities;Functional mobility training;Stair training;Patient/family education;Manual techniques    PT Next Visit Plan  work on ROM. strength and function; incorporate R UE strengthening    PT Home Exercise Plan  added sit to stand without UE support and gastroc stretch standing    Consulted and Agree with Plan of Care  Patient       Patient will benefit from skilled therapeutic intervention in order to improve the following deficits and impairments:  Abnormal gait, Decreased coordination, Decreased range of motion, Difficulty walking, Decreased endurance, Cardiopulmonary status limiting activity, Decreased activity tolerance, Decreased balance, Impaired flexibility, Improper body mechanics, Decreased mobility, Decreased strength, Postural dysfunction  Visit  Diagnosis: Difficulty in walking, not elsewhere classified  Muscle weakness (generalized)  Ataxic gait  Hemiplegia and hemiparesis following cerebral infarction affecting right dominant side [facility])     Problem List Patient Active Problem List   Diagnosis Date Noted  .  Cerebrovascular accident (CVA) due to stenosis of basilar artery ([place]) 08/16/2019  . Elevated LDL cholesterol level 08/07/2019  . Type 2 diabetes mellitus with hyperglycemia, without long-term current use of insulin ([place]) 08/07/2019  . H/O cerebral artery stenosis 07/24/2019  . 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 Abnormality of gait 07/19/2019  . Right wrist drop   . Right foot drop   . Hypokalemia   . Stenosis of artery ([place])   . Benign essential HTN   . Stage 2 chronic kidney disease   . Prediabetes   . Transaminitis   . Hypoalbuminemia due to protein-calorie malnutrition ([place])   . Labile blood pressure   . Left pontine cerebrovascular accident ([place]) 06/20/2019  . Acute ischemic stroke ([place])   . Right hemiparesis ([place])   . Dysphagia, post-stroke   . Basilar artery stenosis with infarction [facility]) s/p tPA 06/19/2019  . Stroke (cerebrum) ([place]) L pontine d/t BA stenosis 06/14/2019  . Subcutaneous nodule of left foot 01/03/2019  . Atopic dermatitis 10/18/2018  . Seasonal allergies 08/09/2018  . 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 Osteoarthritis of carpometacarpal (CMC) joint of thumb 11/07/2017  . Acquired hypothyroidism 08/04/2015  . Essential hypertension 08/04/2015  . GERD without esophagitis 08/04/2015  . Mixed hyperlipidemia 08/04/2015  . Osteopenia 08/04/2015  . Primary insomnia 08/04/2015  . Pure hypertriglyceridemia 08/04/2015   Amador Cunas, PT, DPT Donald Prose X[facility]room 09/20/2019, 9:49 AM  [address] [place], [place], [address] Phone:  [phone]   Fax:  [phone]  Name: Megan Martinez MRN: [id number] Date of Birth: 1951-05-19

## 2019-09-21 ENCOUNTER — Ambulatory Visit (INDEPENDENT_AMBULATORY_CARE_PROVIDER_SITE_OTHER): Payer: PPO | Admitting: Family Medicine

## 2019-09-21 ENCOUNTER — Telehealth: Payer: Self-pay | Admitting: Family Medicine

## 2019-09-21 ENCOUNTER — Encounter: Payer: Self-pay | Admitting: Family Medicine

## 2019-09-21 VITALS — BP 120/72 | HR 65 | Temp 97.0°F | Ht 64.0 in | Wt 150.6 lb

## 2019-09-21 DIAGNOSIS — I10 Essential (primary) hypertension: Secondary | ICD-10-CM

## 2019-09-21 DIAGNOSIS — E039 Hypothyroidism, unspecified: Secondary | ICD-10-CM

## 2019-09-21 DIAGNOSIS — I6322 Cerebral infarction due to unspecified occlusion or stenosis of basilar arteries: Secondary | ICD-10-CM

## 2019-09-21 NOTE — Telephone Encounter (Signed)
Pt called about 2 medications on her medication list on her AVS, The Levocetirizine and Pantoprazole. She wanted to know if Dr Ethelene Hal wanted her to keep taking them. I verified with the provider,Provider advised for pt to contact [place], Lavon Paganini, PA-C, The provider that filled it.  I tried to call pt to let her know but had to leave a message for pt to call back

## 2019-09-21 NOTE — Progress Notes (Signed)
Established Patient Office Visit  Subjective:  Patient ID: Megan Martinez, female    DOB: 1950-10-08  Age: 69 y.o. MRN: [id number]  CC:  Chief Complaint  Patient presents with  . Follow-up    1 month for HTN//pt not fasting//pt states 148/70s//    HPI Megan Martinez presents for follow-up of her hypertension status post discontinuation of Atacand with HCTZ and the addition of chlorthalidone with increasing dose of amlodipine to 10 mg.  Blood pressure has been running in the 120/70 range.  Does increase to 140/80 when she exerts herself.  Unfortunately her cough is still the same.   Cough for years.  There is no fever chills or phlegm production.  Continues to work with physical therapy and Occupational Therapy.  Continues levothyroxine each morning prior to eating.  Using cane for ambulation.  Advised by PT to remove her brace just for ambulation through her house.  Past Medical History:  Diagnosis Date  . Allergy   . Arthritis   . GERD (gastroesophageal reflux disease)   . Hypertension   . Hypothyroidism   . Pneumonia   . Stroke [facility])    right arm is "not working", speech a little slurred  . Thyroid disease     Past Surgical History:  Procedure Laterality Date  . ABDOMINAL HYSTERECTOMY    . APPENDECTOMY    . BREAST SURGERY Right    lumpectomy  . CATARACT EXTRACTION    . CHOLECYSTECTOMY    . IR ANGIO INTRA EXTRACRAN SEL COM CAROTID INNOMINATE BILAT MOD SED  07/24/2019  . IR ANGIO VERTEBRAL SEL SUBCLAVIAN INNOMINATE UNI L MOD SED  07/24/2019  . IR INTRA CRAN STENT  07/24/2019  . RADIOLOGY WITH ANESTHESIA N/A 07/09/2019   Procedure: RADIOLOGY WITH ANESTHESIA STENT PLACEMENT;  Surgeon: Luanne Bras, MD;  Location: [place];  Service: Radiology;  Laterality: N/A;  . RADIOLOGY WITH ANESTHESIA N/A 07/23/2019   Procedure: RADIOLOGY WITH ANESTHESIA STENT PLACEMENT;  Surgeon: Luanne Bras, MD;  Location: [place];  Service: Radiology;  Laterality: N/A;  . RADIOLOGY WITH ANESTHESIA  N/A 07/24/2019   Procedure: RADIOLOGY WITH ANESTHESIA STENT PLACEMENT;  Surgeon: Luanne Bras, MD;  Location: [place];  Service: Radiology;  Laterality: N/A;  . TUBAL LIGATION      History reviewed. No pertinent family history.  Social History   Socioeconomic History  . Marital status: Married    Spouse name: Not on file  . Number of children: Not on file  . Years of education: Not on file  . Highest education level: Not on file  Occupational History  . Not on file  Tobacco Use  . Smoking status: Former Smoker    Quit date: 1970    Years since quitting: 51.3  . Smokeless tobacco: Never Used  Substance and Sexual Activity  . Alcohol use: Yes    Comment: occasional  . Drug use: Never  . Sexual activity: Not on file  Other Topics Concern  . Not on file  Social History Narrative  . Not on file   Social Determinants of Health   Financial Resource Strain:   . Difficulty of Paying Living Expenses:   Food Insecurity:   . Worried About Charity fundraiser in the Last Year:   . Arboriculturist in the Last Year:   Transportation Needs:   . Film/video editor (Medical):   Marland Kitchen Lack of Transportation (Non-Medical):   Physical Activity:   . Days of Exercise per Week:   .  Minutes of Exercise per Session:   Stress:   . Feeling of Stress :   Social Connections:   . Frequency of Communication with Friends and Family:   . Frequency of Social Gatherings with Friends and Family:   . Attends Religious Services:   . Active Member of Clubs or Organizations:   . Attends Archivist Meetings:   Marland Kitchen Marital Status:   Intimate Partner Violence:   . Fear of Current or Ex-Partner:   . Emotionally Abused:   Marland Kitchen Physically Abused:   . Sexually Abused:     Outpatient Medications Prior to Visit  Medication Sig Dispense Refill  . acetaminophen (TYLENOL) 325 MG tablet Take 2 tablets (650 mg total) by mouth every 4 (four) hours as needed for mild pain (or temp > 37.5 C (99.5 F)).      Marland Kitchen amLODipine (NORVASC) 10 MG tablet Take 1 tablet (10 mg total) by mouth daily. 90 tablet 1  . aspirin EC 325 MG EC tablet Take 1 tablet (325 mg total) by mouth daily. 30 tablet 0  . atorvastatin (LIPITOR) 80 MG tablet Take 1 tablet (80 mg total) by mouth daily at 6 PM. 90 tablet 1  . chlorthalidone (HYGROTON) 25 MG tablet Take 1 tablet (25 mg total) by mouth daily. 90 tablet 1  . clopidogrel (PLAVIX) 75 MG tablet Take 1 tablet (75 mg total) by mouth daily. 30 tablet 1  . fluticasone (FLONASE) 50 MCG/ACT nasal spray Place 2 sprays into both nostrils daily. (Patient taking differently: Place 2 sprays into both nostrils daily as needed for allergies. ) 16 g 6  . levocetirizine (XYZAL) 5 MG tablet TAKE 1 TABLET(5 MG) BY MOUTH EVERY EVENING 90 tablet 3  . levothyroxine (SYNTHROID) 137 MCG tablet Take 1 tablet (137 mcg total) by mouth daily at 6 (six) AM. 30 tablet 1  . pantoprazole (PROTONIX) 40 MG tablet Take 1 tablet (40 mg total) by mouth at bedtime. 30 tablet 0   Facility-Administered Medications Prior to Visit  Medication Dose Route Frequency Provider Last Rate Last Admin  . midazolam (VERSED) 5 MG/5ML injection   Intravenous Anesthesia Intra-op Mariea Clonts, CRNA   1 mg at 07/09/19 0845    Allergies  Allergen Reactions  . Latex Itching and Rash  . Statins Other (See Comments)    Myalgias, "sick"   . Amoxicillin Rash    Did it involve swelling of the face/tongue/throat, SOB, or low BP? No Did it involve sudden or severe rash/hives, skin peeling, or any reaction on the inside of your mouth or nose? Yes Did you need to seek medical attention at a hospital or doctor's office? N/A When did it last happen? Child If all above answers are "NO", may proceed with cephalosporin use.  . Codeine Nausea Only  . Penicillin G Rash    Did it involve swelling of the face/tongue/throat, SOB, or low BP? No Did it involve sudden or severe rash/hives, skin peeling, or any reaction on the inside  of your mouth or nose? Yes Did you need to seek medical attention at a hospital or doctor's office? N/A When did it last happen?Child If all above answers are "NO", may proceed with cephalosporin use.    ROS Review of Systems  Constitutional: Negative.   HENT: Negative.   Respiratory: Positive for cough. Negative for shortness of breath and wheezing.   Cardiovascular: Negative.   Gastrointestinal: Negative.   Endocrine: Negative for polyphagia and polyuria.  Genitourinary: Negative.   Musculoskeletal:  Positive for gait problem. Negative for joint swelling.  Allergic/Immunologic: Negative for immunocompromised state.  Neurological: Positive for speech difficulty and weakness. Negative for headaches.  Hematological: Does not bruise/bleed easily.  Psychiatric/Behavioral: Negative.       Objective:    Physical Exam  Constitutional: She is oriented to person, place, and time. She appears well-developed and well-nourished. No distress.  HENT:  Head: Normocephalic and atraumatic.  Right Ear: External ear normal.  Left Ear: External ear normal.  Eyes: Conjunctivae are normal. Right eye exhibits no discharge. Left eye exhibits no discharge. No scleral icterus.  Neck: No JVD present. No tracheal deviation present.  Cardiovascular: Normal rate, regular rhythm and normal heart sounds.  Pulmonary/Chest: Effort normal and breath sounds normal. No stridor.  Neurological: She is alert and oriented to person, place, and time.  Abducts right arm to just below shoulder height.   Skin: Skin is warm and dry. She is not diaphoretic.  Psychiatric: She has a normal mood and affect. Her behavior is normal.    BP 120/72 (BP Location: Left Arm, Patient Position: Sitting, Cuff Size: Normal)   Pulse 65   Temp (!) 97 F (36.1 C) (Tympanic)   Ht 5\' 4"  (1.626 m)   Wt 150 lb 9.6 oz (68.3 kg)   SpO2 97%   BMI 25.85 kg/m  Wt Readings from Last 3 Encounters:  09/21/19 150 lb 9.6 oz (68.3 kg)    08/28/19 155 lb (70.3 kg)  08/16/19 155 lb (70.3 kg)     Health Maintenance Due  Topic Date Due  . Hepatitis C Screening  Never done  . FOOT EXAM  Never done  . OPHTHALMOLOGY EXAM  Never done  . URINE MICROALBUMIN  Never done  . COVID-19 Vaccine (1) Never done  . TETANUS/TDAP  Never done  . MAMMOGRAM  Never done  . COLONOSCOPY  Never done  . DEXA SCAN  Never done    There are no preventive care reminders to display for this patient.  Lab Results  Component Value Date   TSH 1.249 06/16/2019   Lab Results  Component Value Date   WBC 8.3 07/25/2019   HGB 11.2 (L) 07/25/2019   HCT 33.0 (L) 07/25/2019   MCV 86.2 07/25/2019   PLT 245 07/25/2019   Lab Results  Component Value Date   NA 141 07/25/2019   K 3.0 (L) 07/25/2019   CO2 25 07/25/2019   GLUCOSE 129 (H) 07/25/2019   BUN 8 07/25/2019   CREATININE 0.90 07/25/2019   BILITOT 0.5 07/02/2019   ALKPHOS 33 (L) 07/02/2019   AST 18 07/02/2019   ALT 25 07/02/2019   PROT 5.9 (L) 07/02/2019   ALBUMIN 3.2 (L) 07/02/2019   CALCIUM 8.9 07/25/2019   ANIONGAP 9 07/25/2019   GFR 62.19 01/03/2019   Lab Results  Component Value Date   CHOL 204 (H) 06/15/2019   Lab Results  Component Value Date   HDL 35 (L) 06/15/2019   Lab Results  Component Value Date   LDLCALC 137 (H) 06/15/2019   Lab Results  Component Value Date   TRIG 161 (H) 06/15/2019   Lab Results  Component Value Date   CHOLHDL 5.8 06/15/2019   Lab Results  Component Value Date   HGBA1C 5.8 (H) 06/15/2019      Assessment & Plan:   Problem List Items Addressed This Visit      Cardiovascular and Mediastinum   Essential hypertension - Primary   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   Follow-up: Return in about 2 months (around 11/21/2019), or return in 2 months fasting for blood work. continue current  meds..   Continue current medications. Libby Maw, MD

## 2019-09-24 ENCOUNTER — Other Ambulatory Visit: Payer: Self-pay

## 2019-09-24 ENCOUNTER — Other Ambulatory Visit: Payer: Self-pay | Admitting: Family Medicine

## 2019-09-24 ENCOUNTER — Telehealth: Payer: Self-pay | Admitting: Family Medicine

## 2019-09-24 ENCOUNTER — Encounter: Payer: Self-pay | Admitting: Physical Therapy

## 2019-09-24 ENCOUNTER — Ambulatory Visit: Payer: PPO | Attending: Adult Health | Admitting: Physical Therapy

## 2019-09-24 DIAGNOSIS — R262 Difficulty in walking, not elsewhere classified: Secondary | ICD-10-CM | POA: Insufficient documentation

## 2019-09-24 DIAGNOSIS — G8929 Other chronic pain: Secondary | ICD-10-CM | POA: Insufficient documentation

## 2019-09-24 DIAGNOSIS — M25511 Pain in right shoulder: Secondary | ICD-10-CM | POA: Insufficient documentation

## 2019-09-24 DIAGNOSIS — M6281 Muscle weakness (generalized): Secondary | ICD-10-CM | POA: Insufficient documentation

## 2019-09-24 DIAGNOSIS — R260 Ataxic gait: Secondary | ICD-10-CM | POA: Diagnosis not present

## 2019-09-24 DIAGNOSIS — I69351 Hemiplegia and hemiparesis following cerebral infarction affecting right dominant side: Secondary | ICD-10-CM | POA: Diagnosis not present

## 2019-09-24 MED ORDER — PANTOPRAZOLE SODIUM 40 MG PO TBEC: 40.0000 mg | DELAYED_RELEASE_TABLET | Freq: Every day | ORAL | 0 refills | Status: DC

## 2019-09-24 MED ORDER — LEVOTHYROXINE SODIUM 137 MCG PO TABS: 137.0000 ug | ORAL_TABLET | Freq: Every day | ORAL | 1 refills | Status: DC

## 2019-09-24 NOTE — Telephone Encounter (Signed)
Rx sent to pharmacy   

## 2019-09-24 NOTE — Therapy (Signed)
[address] [address] [place], [place], [address] Phone: [phone]   Fax:  [phone]  Physical Therapy Treatment  Patient Details  Name: Megan Martinez MRN: [id number] Date of Birth: April 22, 1951 Referring Provider (PT): Princella Ion   Encounter Date: 09/24/2019  PT End of Session - 09/24/19 0941    Visit Number  3    Date for PT Re-Evaluation  11/17/19    PT Start Time  0845    PT Stop Time  0930    PT Time Calculation (min)  45 min    Activity Tolerance  Patient tolerated treatment well    Behavior During Therapy  [facility] for tasks assessed/performed       Past Medical History:  Diagnosis Date  . Allergy   . Arthritis   . GERD (gastroesophageal reflux disease)   . Hypertension   . Hypothyroidism   . Pneumonia   . Stroke  [facility])    right arm is "not working", speech a little slurred  . Thyroid disease     Past Surgical History:  Procedure Laterality Date  . ABDOMINAL HYSTERECTOMY    . APPENDECTOMY    . BREAST SURGERY Right    lumpectomy  . CATARACT EXTRACTION    . CHOLECYSTECTOMY    . IR ANGIO INTRA EXTRACRAN SEL COM CAROTID INNOMINATE BILAT MOD SED  07/24/2019  . IR ANGIO VERTEBRAL SEL SUBCLAVIAN INNOMINATE UNI L MOD SED  07/24/2019  . IR INTRA CRAN STENT  07/24/2019  . RADIOLOGY WITH ANESTHESIA N/A 07/09/2019   Procedure: RADIOLOGY WITH ANESTHESIA STENT PLACEMENT;  Surgeon: Luanne Bras, MD;  Location: [place];  Service: Radiology;  Laterality: N/A;  . RADIOLOGY WITH ANESTHESIA N/A 07/23/2019   Procedure: RADIOLOGY WITH ANESTHESIA STENT PLACEMENT;  Surgeon: Luanne Bras, MD;  Location: [place];  Service: Radiology;  Laterality: N/A;  . RADIOLOGY WITH ANESTHESIA N/A 07/24/2019   Procedure: RADIOLOGY WITH ANESTHESIA STENT PLACEMENT;  Surgeon: Luanne Bras, MD;  Location: [place];  Service: Radiology;  Laterality: N/A;  . TUBAL LIGATION      There were no vitals filed for this visit.  Subjective  Assessment - 09/24/19 0844    Subjective  Pt reports she went to a car show this weekend and was able to walk around the whole time without her cane and no LOB.    Currently in Pain?  No/denies    Pain Score  0-No pain    Pain Location  Shoulder    Pain Orientation  Right                       [facility] Adult PT Treatment/Exercise - 09/24/19 0001      Ambulation/Gait   Ambulation/Gait  Yes    Ambulation/Gait Assistance  6: Modified independent (Device/Increase time)    Ambulation Distance (Feet)  500 Feet    Assistive device  Straight cane    Gait Pattern  Step-through pattern;Decreased arm swing - right;Decreased step length - left;Decreased stance time - right;Decreased hip/knee flexion - right;Decreased dorsiflexion - right;Decreased weight shift to right;Poor foot clearance - right    Ambulation Surface  Level    Gait velocity  gait x2 down hall with SPC; x2 with no AD; x1 with no AFO on RLE      Exercises   Exercises  Shoulder      Knee/Hip Exercises: Aerobic   Nustep  level 4 x 6 minutes  Shoulder Exercises: Stretch   Other Shoulder Stretches  Pec, biceps stretch supine      Manual Therapy   Manual Therapy  Joint mobilization;Soft tissue mobilization;Passive ROM    Joint Mobilization  inferior mob grade II-III to R shoulder for abduction and pain    Soft tissue mobilization  STM to R shoulder, bicep, tricep, and UT    Passive ROM  shoulder flexion, abduction, IR, ER (pt very limited in all directions)             PT Education - 09/24/19 0941    Education Details  Pt educated on use of SPC and AFO    Person(s) Educated  Patient    Methods  Explanation    Comprehension  Verbalized understanding       PT Short Term Goals - 09/17/19 1012      PT SHORT TERM GOAL #1   Title  Independent with initial HEP    Time  4    Period  Weeks    Status  New        PT Long Term Goals - 09/17/19 1012      PT LONG TERM GOAL #1   Title  decrease TUG to  13 seconds    Time  12    Period  Weeks    Status  New      PT LONG TERM GOAL #2   Title  walk without device on uneven surfaces safely    Time  12    Period  Weeks    Status  New      PT LONG TERM GOAL #3   Title  go up and down stairs step over step    Time  12    Period  Weeks    Status  New      PT LONG TERM GOAL #4   Title  increase right ankle DF to 5 degrees    Time  12    Period  Weeks    Status  New            Plan - 09/24/19 0955    Clinical Impression Statement  Pt was supervision-CGA for gait activities today with and without AD. Recommended pt to obtain [facility]; quad cane is slowing patient down and she often carries cane rather than using it. Pt demonstrated gait with no LOB with SPC. Trialed gait without AFO; pt demonstrates signifigant foot drop with increasing levels of fatigue. Extensive mobs, stretching, and ROM to R shoulder today.    PT Treatment/Interventions  ADLs/Self Care Home Management;Gait training;Neuromuscular re-education;Balance training;Therapeutic exercise;Therapeutic activities;Functional mobility training;Stair training;Patient/family education;Manual techniques    PT Next Visit Plan  work on ROM. strength and function; incorporate R UE strengthening and stretching    Consulted and Agree with Plan of Care  Patient       Patient will benefit from skilled therapeutic intervention in order to improve the following deficits and impairments:  Abnormal gait, Decreased coordination, Decreased range of motion, Difficulty walking, Decreased endurance, Cardiopulmonary status limiting activity, Decreased activity tolerance, Decreased balance, Impaired flexibility, Improper body mechanics, Decreased mobility, Decreased strength, Postural dysfunction  Visit Diagnosis: Difficulty in walking, not elsewhere classified  Muscle weakness (generalized)  Ataxic gait  Hemiplegia and hemiparesis following cerebral infarction affecting right dominant side  (HCC)  Chronic right shoulder pain     Problem List Patient Active Problem List   Diagnosis Date Noted  . Cerebrovascular accident (CVA) due to stenosis of basilar artery (  [place]) 08/16/2019  . Elevated LDL cholesterol level 08/07/2019  . Type 2 diabetes mellitus with hyperglycemia, without long-term current use of insulin ([place]) 08/07/2019  . H/O cerebral artery stenosis 07/24/2019  . 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 Abnormality of gait 07/19/2019  . Right wrist drop   . Right foot drop   . Hypokalemia   . Stenosis of artery ([place])   . Benign essential HTN   . Stage 2 chronic kidney disease   . Prediabetes   . Transaminitis   . Hypoalbuminemia due to protein-calorie malnutrition ([place])   . Labile blood pressure   . Left pontine cerebrovascular accident ([place]) 06/20/2019  . Acute ischemic stroke ([place])   . Right hemiparesis ([place])   . Dysphagia, post-stroke   . Basilar artery stenosis with infarction [facility]) s/p tPA 06/19/2019  . Stroke (cerebrum) ([place]) L pontine d/t BA stenosis 06/14/2019  . Subcutaneous nodule of left foot 01/03/2019  . Atopic dermatitis 10/18/2018  . Seasonal allergies 08/09/2018  . 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 Osteoarthritis of carpometacarpal (CMC) joint of thumb 11/07/2017  . Acquired hypothyroidism 08/04/2015  . Essential hypertension 08/04/2015  . GERD without esophagitis 08/04/2015  . Mixed hyperlipidemia 08/04/2015  . Osteopenia 08/04/2015  . Primary insomnia 08/04/2015  . Pure hypertriglyceridemia 08/04/2015   Amador Cunas, PT, DPT Donald Prose Wandy Bossler 09/24/2019, 9:58 AM  [address] [place], [place], [address] Phone: [phone]   Fax:  [phone]  Name: Megan Martinez MRN: [id number] Date of Birth: 1951-05-18

## 2019-09-24 NOTE — Telephone Encounter (Signed)
Patient is calling and requesting a refill for pantoprazole and levocetirizine sent to [facility] on [address] in [place]. CB is [phone]

## 2019-09-25 ENCOUNTER — Other Ambulatory Visit: Payer: Self-pay

## 2019-09-25 NOTE — Patient Outreach (Signed)
Telephone outreach to patient to obtain mRS was successfully completed. MRS=3  [facility] Management Assistant [phone]

## 2019-09-26 ENCOUNTER — Ambulatory Visit: Payer: PPO | Admitting: Physical Therapy

## 2019-10-01 ENCOUNTER — Other Ambulatory Visit: Payer: Self-pay

## 2019-10-01 ENCOUNTER — Encounter: Payer: Self-pay | Admitting: Physical Therapy

## 2019-10-01 ENCOUNTER — Ambulatory Visit: Payer: PPO | Admitting: Physical Therapy

## 2019-10-01 DIAGNOSIS — R262 Difficulty in walking, not elsewhere classified: Secondary | ICD-10-CM

## 2019-10-01 DIAGNOSIS — R260 Ataxic gait: Secondary | ICD-10-CM

## 2019-10-01 DIAGNOSIS — G8929 Other chronic pain: Secondary | ICD-10-CM

## 2019-10-01 DIAGNOSIS — I69351 Hemiplegia and hemiparesis following cerebral infarction affecting right dominant side: Secondary | ICD-10-CM

## 2019-10-01 DIAGNOSIS — M6281 Muscle weakness (generalized): Secondary | ICD-10-CM

## 2019-10-01 NOTE — Therapy (Signed)
[place] [address] [place], [place], [address] Phone: [phone]   Fax:  [phone]  Physical Therapy Treatment  Patient Details  Name: Megan Martinez MRN: [id number] Date of Birth: July 15, 1950 Referring Provider (PT): Princella Ion   Encounter Date: 10/01/2019  PT End of Session - 10/01/19 1053    Visit Number  4    Date for PT Re-Evaluation  11/17/19    PT Start Time  1011    PT Stop Time  1055    PT Time Calculation (min)  44 min    Activity Tolerance  Patient tolerated treatment well    Behavior During Therapy  [facility] for tasks assessed/performed       Past Medical History:  Diagnosis Date  . Allergy   . Arthritis   . GERD (gastroesophageal reflux disease)   . Hypertension   . Hypothyroidism   . Pneumonia   . Stroke [facility])    right arm is "not working", speech a little slurred  . Thyroid disease     Past Surgical History:  Procedure Laterality Date  . ABDOMINAL HYSTERECTOMY    . APPENDECTOMY    . BREAST SURGERY Right    lumpectomy  . CATARACT EXTRACTION    . CHOLECYSTECTOMY    . IR ANGIO INTRA EXTRACRAN SEL COM CAROTID INNOMINATE BILAT MOD SED  07/24/2019  . IR ANGIO VERTEBRAL SEL SUBCLAVIAN INNOMINATE UNI L MOD SED  07/24/2019  . IR INTRA CRAN STENT  07/24/2019  . RADIOLOGY WITH ANESTHESIA N/A 07/09/2019   Procedure: RADIOLOGY WITH ANESTHESIA STENT PLACEMENT;  Surgeon: Luanne Bras, MD;  Location: [place];  Service: Radiology;  Laterality: N/A;  . RADIOLOGY WITH ANESTHESIA N/A 07/23/2019   Procedure: RADIOLOGY WITH ANESTHESIA STENT PLACEMENT;  Surgeon: Luanne Bras, MD;  Location: [place];  Service: Radiology;  Laterality: N/A;  . RADIOLOGY WITH ANESTHESIA N/A 07/24/2019   Procedure: RADIOLOGY WITH ANESTHESIA STENT PLACEMENT;  Surgeon: Luanne Bras, MD;  Location: [place];  Service: Radiology;  Laterality: N/A;  . TUBAL LIGATION      There were no vitals filed for this visit.  Subjective  Assessment - 10/01/19 1016    Subjective  reports that she is trying to make the right hand move more    Currently in Pain?  No/denies                       [facility] Adult PT Treatment/Exercise - 10/01/19 0001      Knee/Hip Exercises: Aerobic   Recumbent Bike  5 minutes    Nustep  level 4 x 4 minutes    Other Aerobic  UBE level 3 x 4 minutes change directions every minute      Knee/Hip Exercises: Machines for Strengthening   Cybex Knee Extension  5# 3x5    Cybex Knee Flexion  15# x 10, 10# 2x10    Cybex Leg Press  20# right only 4x 5      Shoulder Exercises: Prone   Other Prone Exercises  quadraped wegiht shift onto shoulders and hands      Shoulder Exercises: Standing   Other Standing Exercises  cone stacking with the right hand, velcro board, marble pick ups               PT Short Term Goals - 09/17/19 1012      PT SHORT TERM GOAL #1   Title  Independent with initial HEP  Time  4    Period  Weeks    Status  New        PT Long Term Goals - 10/01/19 1055      PT LONG TERM GOAL #2   Title  walk without device on uneven surfaces safely    Status  On-going      PT LONG TERM GOAL #3   Title  go up and down stairs step over step    Status  On-going            Plan - 10/01/19 1054    Clinical Impression Statement  Added more exercises in the overload principle to see if we can "wake up" the mms and nerves, she did well but was fatigues, tried some quadraped, had some c/o pain in the shoulder, still struggles with dexterity of the right hand    PT Next Visit Plan  work on ROM. strength and function; incorporate R UE strengthening and stretching    Consulted and Agree with Plan of Care  Patient       Patient will benefit from skilled therapeutic intervention in order to improve the following deficits and impairments:  Abnormal gait, Decreased coordination, Decreased range of motion, Difficulty walking, Decreased endurance, Cardiopulmonary  status limiting activity, Decreased activity tolerance, Decreased balance, Impaired flexibility, Improper body mechanics, Decreased mobility, Decreased strength, Postural dysfunction  Visit Diagnosis: Difficulty in walking, not elsewhere classified  Muscle weakness (generalized)  Ataxic gait  Hemiplegia and hemiparesis following cerebral infarction affecting right dominant side (HCC)  Chronic right shoulder pain     Problem List Patient Active Problem List   Diagnosis Date Noted  . Cerebrovascular accident (CVA) due to stenosis of basilar artery ([place]) 08/16/2019  . Elevated LDL cholesterol level 08/07/2019  . Type 2 diabetes mellitus with hyperglycemia, without long-term current use of insulin ([place]) 08/07/2019  . H/O cerebral artery stenosis 07/24/2019  . 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 Abnormality of gait 07/19/2019  . Right wrist drop   . Right foot drop   . Hypokalemia   . Stenosis of artery ([place])   . Benign essential HTN   . Stage 2 chronic kidney disease   . Prediabetes   . Transaminitis   . Hypoalbuminemia due to protein-calorie malnutrition ([place])   . Labile blood pressure   . Left pontine cerebrovascular accident ([place]) 06/20/2019  . Acute ischemic stroke ([place])   . Right hemiparesis ([place])   . Dysphagia, post-stroke   . Basilar artery stenosis with infarction [facility]) s/p tPA 06/19/2019  . Stroke (cerebrum) ([place]) L pontine d/t BA stenosis 06/14/2019  . Subcutaneous nodule of left foot 01/03/2019  . Atopic dermatitis 10/18/2018  . Seasonal allergies 08/09/2018  . 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 Osteoarthritis of carpometacarpal (CMC) joint of thumb 11/07/2017  . Acquired hypothyroidism 08/04/2015  . Essential hypertension 08/04/2015  . GERD without esophagitis 08/04/2015  . Mixed hyperlipidemia 08/04/2015  . Osteopenia 08/04/2015  . Primary insomnia 08/04/2015  .  Pure hypertriglyceridemia 08/04/2015    Sumner Boast., PT 10/01/2019, 10:56 AM  [place] [address] [place], [place], [address] Phone: [phone]   Fax:  [phone]  Name: Megan Martinez MRN: [id number] Date of Birth: [birth year]

## 2019-10-03 ENCOUNTER — Encounter: Payer: Self-pay | Admitting: Physical Therapy

## 2019-10-03 ENCOUNTER — Other Ambulatory Visit: Payer: Self-pay

## 2019-10-03 ENCOUNTER — Ambulatory Visit: Payer: PPO | Admitting: Physical Therapy

## 2019-10-03 DIAGNOSIS — R262 Difficulty in walking, not elsewhere classified: Secondary | ICD-10-CM | POA: Diagnosis not present

## 2019-10-03 DIAGNOSIS — I69351 Hemiplegia and hemiparesis following cerebral infarction affecting right dominant side: Secondary | ICD-10-CM

## 2019-10-03 DIAGNOSIS — R260 Ataxic gait: Secondary | ICD-10-CM

## 2019-10-03 DIAGNOSIS — G8929 Other chronic pain: Secondary | ICD-10-CM

## 2019-10-03 DIAGNOSIS — M6281 Muscle weakness (generalized): Secondary | ICD-10-CM

## 2019-10-03 NOTE — Therapy (Signed)
Megan [facility], [place], [address] Phone: [phone]   Fax:  [phone]  Physical Therapy Treatment  Patient Details  Name: Megan Martinez MRN: [id number] Date of Birth: [birth year] Referring Provider (PT): [place]   Encounter Date: 10/03/2019  PT End of Session - 10/03/19 1020    Visit Number  5    Date for PT Re-Evaluation  11/17/19    PT Start Time  0845    PT Stop Time  0930    PT Time Calculation (min)  45 min    Equipment Utilized During Treatment  Gait belt    Activity Tolerance  Patient tolerated treatment well    Behavior During Therapy  Megan [facility] for tasks assessed/performed       Past Medical History:  Diagnosis Date  . Allergy   . Arthritis   . GERD (gastroesophageal reflux disease)   . Hypertension   . Hypothyroidism   . Pneumonia   . Stroke [facility])    right arm is "not working", speech a little slurred  . Thyroid disease     Past Surgical History:  Procedure Laterality Date  . ABDOMINAL HYSTERECTOMY    . APPENDECTOMY    . BREAST SURGERY Right    lumpectomy  . CATARACT EXTRACTION    . CHOLECYSTECTOMY    . IR ANGIO INTRA EXTRACRAN SEL COM CAROTID INNOMINATE BILAT MOD SED  07/24/2019  . IR ANGIO VERTEBRAL SEL SUBCLAVIAN INNOMINATE UNI L MOD SED  07/24/2019  . IR INTRA CRAN STENT  07/24/2019  . RADIOLOGY WITH ANESTHESIA N/A 07/09/2019   Procedure: RADIOLOGY WITH ANESTHESIA STENT PLACEMENT;  Surgeon: [place] Bras, MD;  Location: [facility];  Service: Radiology;  Laterality: N/A;  . RADIOLOGY WITH ANESTHESIA N/A 07/23/2019   Procedure: RADIOLOGY WITH ANESTHESIA STENT PLACEMENT;  Surgeon: Megan Bras, MD;  Location: [facility];  Service: Radiology;  Laterality: N/A;  . RADIOLOGY WITH ANESTHESIA N/A 07/24/2019   Procedure: RADIOLOGY WITH ANESTHESIA STENT PLACEMENT;  Surgeon: Megan Bras, MD;  Location: [facility];  Service: Radiology;  Laterality: N/A;  . TUBAL LIGATION      There  were no vitals filed for this visit.  Subjective Assessment - 10/03/19 0840    Subjective  Reports that she was a little sore following last rx but feels better now    Currently in Pain?  No/denies    Pain Score  0-No pain                       [facility] Adult PT Treatment/Exercise - 10/03/19 0001      Ambulation/Gait   Ambulation/Gait  Yes    Ambulation/Gait Assistance  6: Modified independent (Device/Increase time)    Ambulation Distance (Feet)  1000 Feet    Assistive device  None    Gait Pattern  Step-through pattern;Decreased arm swing - right;Decreased step length - left;Decreased stance time - right;Decreased hip/knee flexion - right;Decreased dorsiflexion - right;Decreased weight shift to right;Poor foot clearance - right    Ambulation Surface  Level    Gait velocity  gait x3 down hall w/out AD; supervision assist      Knee/Hip Exercises: Aerobic   Nustep  level 5 x 8 min      Knee/Hip Exercises: Machines for Strengthening   Cybex Knee Extension  5# 2x10    Cybex Knee Flexion  25# 2x10    Cybex Leg Press  20# BLE 2x10, no weight RLE  2x10               PT Short Term Goals - 09/17/19 1012      PT SHORT TERM GOAL #1   Title  Independent with initial HEP    Time  4    Period  Weeks    Status  New        PT Long Term Goals - 10/01/19 1055      PT LONG TERM GOAL #2   Title  walk without device on uneven surfaces safely    Status  On-going      PT LONG TERM GOAL #3   Title  go up and down stairs step over step    Status  On-going            Plan - 10/03/19 1021    Clinical Impression Statement  Pt demonstrated gait without AD and supervision assist 1081ft today. Pt ambulated without AFO; demonstrates foot drop with increasing levels of fatigue. Begins to walk with veering to side with increasing levels of fatigue. Pt tolerated increasing level of exercise well.    PT Treatment/Interventions  ADLs/Self Care Home Management;Gait  training;Neuromuscular re-education;Balance training;Therapeutic exercise;Therapeutic activities;Functional mobility training;Stair training;Patient/family education;Manual techniques    PT Next Visit Plan  work on ROM. strength and function; incorporate R UE strengthening and stretching    Consulted and Agree with Plan of Care  Patient       Patient will benefit from skilled therapeutic intervention in order to improve the following deficits and impairments:  Abnormal gait, Decreased coordination, Decreased range of motion, Difficulty walking, Decreased endurance, Cardiopulmonary status limiting activity, Decreased activity tolerance, Decreased balance, Impaired flexibility, Improper body mechanics, Decreased mobility, Decreased strength, Postural dysfunction  Visit Diagnosis: Difficulty in walking, not elsewhere classified  Muscle weakness (generalized)  Ataxic gait  Hemiplegia and hemiparesis following cerebral infarction affecting right dominant side (HCC)  Chronic right shoulder pain     Problem List Patient Active Problem List   Diagnosis Date Noted  . 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 Elevated LDL cholesterol level 08/07/2019  . Type 2 diabetes mellitus with hyperglycemia, without long-term current use of insulin ([facility]egan [facility]) 08/07/2019  . H/O cerebral artery stenosis 07/24/2019  . 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 Abnormality of gait 07/19/2019  . Right wrist drop   . Right foot drop   . Hypokalemia   . Stenosis of artery ([facility])   . Benign essential HTN   . Stage 2 chronic kidney disease   . Prediabetes   . Transaminitis   . Hypoalbuminemia due to protein-calorie malnutrition ([facility] Martinez)   . Labile blood pressure   . Left pontine cerebrovascular accident ([facility]) 06/20/2019  . Acute ischemic stroke ([facility])   . Right hemiparesis ([facility])   . Dysphagia, post-stroke   . Basilar artery  stenosis with infarction [facility]) s/p tPA 06/19/2019  . Stroke (cerebrum) ([facility]) L pontine d/t BA stenosis 06/14/2019  . Subcutaneous nodule of left foot 01/03/2019  . Atopic dermatitis 10/18/2018  . Seasonal allergies 08/09/2018  . 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 Osteoarthritis of carpometacarpal (CMC) joint of thumb 11/07/2017  . Acquired hypothyroidism 08/04/2015  . Essential hypertension 08/04/2015  . GERD without esophagitis 08/04/2015  . Mixed hyperlipidemia 08/04/2015  . Osteopenia 08/04/2015  . Primary insomnia 08/04/2015  . Pure hypertriglyceridemia 08/04/2015   Amador Cunas, PT, DPT Donald Prose Maty Zeisler 10/03/2019,  10:24 AM  [place], [place], [address] Phone: [phone]   Fax:  [phone]  Name: Megan [facility] MRN: [id number] Date of Birth: 11-Apr-1951

## 2019-10-08 ENCOUNTER — Ambulatory Visit: Payer: PPO | Admitting: Physical Therapy

## 2019-10-08 ENCOUNTER — Other Ambulatory Visit: Payer: Self-pay

## 2019-10-08 ENCOUNTER — Encounter: Payer: Self-pay | Admitting: Physical Therapy

## 2019-10-08 DIAGNOSIS — R262 Difficulty in walking, not elsewhere classified: Secondary | ICD-10-CM

## 2019-10-08 DIAGNOSIS — I69351 Hemiplegia and hemiparesis following cerebral infarction affecting right dominant side: Secondary | ICD-10-CM | POA: Diagnosis not present

## 2019-10-08 DIAGNOSIS — M6281 Muscle weakness (generalized): Secondary | ICD-10-CM

## 2019-10-08 DIAGNOSIS — R260 Ataxic gait: Secondary | ICD-10-CM

## 2019-10-08 DIAGNOSIS — G8929 Other chronic pain: Secondary | ICD-10-CM

## 2019-10-08 DIAGNOSIS — M25511 Pain in right shoulder: Secondary | ICD-10-CM

## 2019-10-08 NOTE — Therapy (Signed)
[address] [place], [place], [address] Phone: [phone]   Fax:  [phone]  Physical Therapy Treatment  Patient Details  Name: Megan [facility] MRN: [id number] Date of Birth: 1950-11-03 Referring Provider (PT): Princella Ion   Encounter Date: 10/08/2019  PT End of Session - 10/08/19 1057    Visit Number  6    Date for PT Re-Evaluation  11/17/19    PT Start Time  0923    PT Stop Time  1006    PT Time Calculation (min)  43 min       Past Medical History:  Diagnosis Date  . Allergy   . Arthritis   . GERD (gastroesophageal reflux disease)   . Hypertension   . Hypothyroidism   . Pneumonia   . Stroke [facility])    right arm is "not working", speech a little slurred  . Thyroid disease     Past Surgical History:  Procedure Laterality Date  . ABDOMINAL HYSTERECTOMY    . APPENDECTOMY    . BREAST SURGERY Right    lumpectomy  . CATARACT EXTRACTION    . CHOLECYSTECTOMY    . IR ANGIO INTRA EXTRACRAN SEL COM CAROTID INNOMINATE BILAT MOD SED  07/24/2019  . IR ANGIO VERTEBRAL SEL SUBCLAVIAN INNOMINATE UNI L MOD SED  07/24/2019  . IR INTRA CRAN STENT  07/24/2019  . RADIOLOGY WITH ANESTHESIA N/A 07/09/2019   Procedure: RADIOLOGY WITH ANESTHESIA STENT PLACEMENT;  Surgeon: Luanne Bras, MD;  Location: [place];  Service: Radiology;  Laterality: N/A;  . RADIOLOGY WITH ANESTHESIA N/A 07/23/2019   Procedure: RADIOLOGY WITH ANESTHESIA STENT PLACEMENT;  Surgeon: Luanne Bras, MD;  Location: [place];  Service: Radiology;  Laterality: N/A;  . RADIOLOGY WITH ANESTHESIA N/A 07/24/2019   Procedure: RADIOLOGY WITH ANESTHESIA STENT PLACEMENT;  Surgeon: Luanne Bras, MD;  Location: [place];  Service: Radiology;  Laterality: N/A;  . TUBAL LIGATION      There were no vitals filed for this visit.  Subjective Assessment - 10/08/19 0925    Subjective  Patient reports that she is worn out, reports that she tried to clean up her  car port moved things around and used a blower, "I can't beleive that little bit of work made me so tired"    Currently in Pain?  No/denies                        [facility] Adult PT Treatment/Exercise - 10/08/19 0001      Ambulation/Gait   Gait Comments  gait iwth SBQC outside negotiating curbs and uneven terrain      Knee/Hip Exercises: Aerobic   Nustep  level 5 x 8 min    Other Aerobic  UBE level 3 x 4 minutes change directions every minute      Knee/Hip Exercises: Machines for Strengthening   Cybex Knee Extension  5# x10 then 2 swts of 5, did it this way secondary to significant crepitus after 5 reps    Cybex Knee Flexion  15# right only 2x10    Cybex Leg Press  20# BLE 2x10, no weight RLE 2x10, right leg 20# small ROM working on control      Knee/Hip Exercises: Seated   Sit to [employer]  10 reps;without UE support      Shoulder Exercises: Prone   Other Prone Exercises  quadraped wegiht shift onto shoulders and hands, also did firehydrants, needing some assist with the  arm      Shoulder Exercises: Standing   Flexion  AAROM;20 reps    Flexion Limitations  used wand    Extension  Right;10 reps;Weights    Extension Weight (lbs)  5    Row  Right;20 reps;Weights    Row Weight (lbs)  5    Other Standing Exercises  15# triceps 2x10    Other Standing Exercises  cone stacking with the right hand, velcro board, marble pick ups               PT Short Term Goals - 09/17/19 1012      PT SHORT TERM GOAL #1   Title  Independent with initial HEP    Time  4    Period  Weeks    Status  New        PT Long Term Goals - 10/01/19 1055      PT LONG TERM GOAL #2   Title  walk without device on uneven surfaces safely    Status  On-going      PT LONG TERM GOAL #3   Title  go up and down stairs step over step    Status  On-going            Plan - 10/08/19 1058    Clinical Impression Statement  Patient really struggles with the use of the right arm, really  working on sending signals through the arm.  Has difficulty with the weight bearing.  I have added more and more exercises for the arm, had to modify the leg exercises some due to crepitus and pain in the knees    PT Next Visit Plan  work on ROM. strength and function; incorporate R UE strengthening and stretching    Consulted and Agree with Plan of Care  Patient       Patient will benefit from skilled therapeutic intervention in order to improve the following deficits and impairments:  Abnormal gait, Decreased coordination, Decreased range of motion, Difficulty walking, Decreased endurance, Cardiopulmonary status limiting activity, Decreased activity tolerance, Decreased balance, Impaired flexibility, Improper body mechanics, Decreased mobility, Decreased strength, Postural dysfunction  Visit Diagnosis: Difficulty in walking, not elsewhere classified  Muscle weakness (generalized)  Ataxic gait  Hemiplegia and hemiparesis following cerebral infarction affecting right dominant side (HCC)  Chronic right shoulder pain     Problem List Patient Active Problem List   Diagnosis Date Noted  . 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 Elevated LDL cholesterol level 08/07/2019  . Type 2 diabetes mellitus with hyperglycemia, without long-term current use of insulin ([place]) 08/07/2019  . H/O cerebral artery stenosis 07/24/2019  . 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 Abnormality of gait 07/19/2019  . Right wrist drop   . Right foot drop   . Hypokalemia   . Stenosis of artery ([place])   . Benign essential HTN   . Stage 2 chronic kidney disease   . Prediabetes   . Transaminitis   . Hypoalbuminemia due to protein-calorie malnutrition ([place])   . Labile blood pressure   . Left pontine cerebrovascular accident ([place]) 06/20/2019  . Acute ischemic stroke ([place])   . Right hemiparesis ([place])   . Dysphagia,  post-stroke   . Basilar artery stenosis with infarction [facility]) s/p tPA 06/19/2019  . Stroke (cerebrum) ([place]) L pontine d/t BA stenosis 06/14/2019  . Subcutaneous nodule of left foot 01/03/2019  . Atopic dermatitis 10/18/2018  .  Seasonal allergies 08/09/2018  . 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 Osteoarthritis of carpometacarpal (CMC) joint of thumb 11/07/2017  . Acquired hypothyroidism 08/04/2015  . Essential hypertension 08/04/2015  . GERD without esophagitis 08/04/2015  . Mixed hyperlipidemia 08/04/2015  . Osteopenia 08/04/2015  . Primary insomnia 08/04/2015  . Pure hypertriglyceridemia 08/04/2015    Sumner Boast., PT 10/08/2019, 11:03 AM  [address] [place], [place], [address] Phone: [phone]   Fax:  [phone]  Name: [facility] MRN: [id number] Date of Birth: March 24, 1951

## 2019-10-10 ENCOUNTER — Ambulatory Visit: Payer: PPO | Admitting: Physical Therapy

## 2019-10-10 ENCOUNTER — Telehealth: Payer: Self-pay | Admitting: Family Medicine

## 2019-10-10 ENCOUNTER — Other Ambulatory Visit: Payer: Self-pay

## 2019-10-10 ENCOUNTER — Encounter: Payer: Self-pay | Admitting: Physical Therapy

## 2019-10-10 DIAGNOSIS — M6281 Muscle weakness (generalized): Secondary | ICD-10-CM

## 2019-10-10 DIAGNOSIS — R260 Ataxic gait: Secondary | ICD-10-CM

## 2019-10-10 DIAGNOSIS — R262 Difficulty in walking, not elsewhere classified: Secondary | ICD-10-CM | POA: Diagnosis not present

## 2019-10-10 DIAGNOSIS — I69351 Hemiplegia and hemiparesis following cerebral infarction affecting right dominant side: Secondary | ICD-10-CM

## 2019-10-10 NOTE — Therapy (Signed)
[address] [place], [place], [address] Phone: [phone]   Fax:  [phone]  Physical Therapy Treatment  Patient Details  Name: Megan [facility] MRN: [id number] Date of Birth: 1950-08-29 Referring Provider (PT): Princella Ion   Encounter Date: 10/10/2019  PT End of Session - 10/10/19 0933    Visit Number  7    Date for PT Re-Evaluation  11/17/19    PT Start Time  0845    PT Stop Time  0930    PT Time Calculation (min)  45 min    Equipment Utilized During Treatment  Gait belt    Activity Tolerance  Patient tolerated treatment well    Behavior During Therapy  [facility] for tasks assessed/performed       Past Medical History:  Diagnosis Date  . Allergy   . Arthritis   . GERD (gastroesophageal reflux disease)   . Hypertension   . Hypothyroidism   . Pneumonia   . Stroke [facility])    right arm is "not working", speech a little slurred  . Thyroid disease     Past Surgical History:  Procedure Laterality Date  . ABDOMINAL HYSTERECTOMY    . APPENDECTOMY    . BREAST SURGERY Right    lumpectomy  . CATARACT EXTRACTION    . CHOLECYSTECTOMY    . IR ANGIO INTRA EXTRACRAN SEL COM CAROTID INNOMINATE BILAT MOD SED  07/24/2019  . IR ANGIO VERTEBRAL SEL SUBCLAVIAN INNOMINATE UNI L MOD SED  07/24/2019  . IR INTRA CRAN STENT  07/24/2019  . RADIOLOGY WITH ANESTHESIA N/A 07/09/2019   Procedure: RADIOLOGY WITH ANESTHESIA STENT PLACEMENT;  Surgeon: Luanne Bras, MD;  Location: [facility];  Service: Radiology;  Laterality: N/A;  . RADIOLOGY WITH ANESTHESIA N/A 07/23/2019   Procedure: RADIOLOGY WITH ANESTHESIA STENT PLACEMENT;  Surgeon: Luanne Bras, MD;  Location: [place];  Service: Radiology;  Laterality: N/A;  . RADIOLOGY WITH ANESTHESIA N/A 07/24/2019   Procedure: RADIOLOGY WITH ANESTHESIA STENT PLACEMENT;  Surgeon: Luanne Bras, MD;  Location: [place];  Service: Radiology;  Laterality: N/A;  . TUBAL LIGATION      There  were no vitals filed for this visit.  Subjective Assessment - 10/10/19 0843    Subjective  Pt reports that she is very tired lately; says she thinks she is trying to "do too much."    Pain Score  0-No pain                        [facility] Adult PT Treatment/Exercise - 10/10/19 0001      Ambulation/Gait   Ambulation/Gait  Yes    Ambulation/Gait Assistance  6: Modified independent (Device/Increase time)    Ambulation Distance (Feet)  1500 Feet    Assistive device  None    Gait Pattern  Step-through pattern;Decreased arm swing - right;Decreased step length - left;Decreased stance time - right;Decreased hip/knee flexion - right;Decreased dorsiflexion - right;Decreased weight shift to right;Poor foot clearance - right    Ambulation Surface  Unlevel;Outdoor;Paved;Grass    Gait Comments  gait with no AD over level/unlevel surfaces, grass, curbs with no LOB and no assist from PT      Neuro Re-ed    Neuro Re-ed Details   weightbearing through RUE with mini table pushups 2x10      Knee/Hip Exercises: Aerobic   Nustep  level 5 x 8 min      Knee/Hip Exercises: Machines for Strengthening  Cybex Knee Extension  5# 2x10 RLE    Cybex Knee Flexion  15# 2x10 RLE    Cybex Leg Press  40# BLE 2x10, 20# 1x10      Knee/Hip Exercises: Standing   Forward Step Up  Both;2 sets;10 reps;Step Height: 4";Hand Hold: 1    Step Down  Both;2 sets;10 reps;Hand Hold: 1;Step Height: 2";Step Height: 4"               PT Short Term Goals - 09/17/19 1012      PT SHORT TERM GOAL #1   Title  Independent with initial HEP    Time  4    Period  Weeks    Status  New        PT Long Term Goals - 10/01/19 1055      PT LONG TERM GOAL #2   Title  walk without device on uneven surfaces safely    Status  On-going      PT LONG TERM GOAL #3   Title  go up and down stairs step over step    Status  On-going            Plan - 10/10/19 0933    Clinical Impression Statement  Pt did well with  gait over level/unlevel surfaces, grass, navigating curbs, and inclines. Supervision-CGA from PT with no LOB; struggles some with RLE foot clearance on curbs. Weightbearing through RUE to facilitate movement.    PT Treatment/Interventions  ADLs/Self Care Home Management;Gait training;Neuromuscular re-education;Balance training;Therapeutic exercise;Therapeutic activities;Functional mobility training;Stair training;Patient/family education;Manual techniques    PT Next Visit Plan  work on ROM. strength and function; incorporate R UE strengthening and stretching    PT Home Exercise Plan  added sit to stand without UE support and gastroc stretch standing    Consulted and Agree with Plan of Care  Patient       Patient will benefit from skilled therapeutic intervention in order to improve the following deficits and impairments:  Abnormal gait, Decreased coordination, Decreased range of motion, Difficulty walking, Decreased endurance, Cardiopulmonary status limiting activity, Decreased activity tolerance, Decreased balance, Impaired flexibility, Improper body mechanics, Decreased mobility, Decreased strength, Postural dysfunction  Visit Diagnosis: Difficulty in walking, not elsewhere classified  Muscle weakness (generalized)  Ataxic gait  Hemiplegia and hemiparesis following cerebral infarction affecting right dominant side [facility])     Problem List Patient Active Problem List   Diagnosis Date Noted  . 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 Elevated LDL cholesterol level 08/07/2019  . Type 2 diabetes mellitus with hyperglycemia, without long-term current use of insulin ([place]) 08/07/2019  . H/O cerebral artery stenosis 07/24/2019  . 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 Abnormality of gait 07/19/2019  . Right wrist drop   . Right foot drop   . Hypokalemia   . Stenosis of artery ([place])   . Benign  essential HTN   . Stage 2 chronic kidney disease   . Prediabetes   . Transaminitis   . Hypoalbuminemia due to protein-calorie malnutrition ([place])   . Labile blood pressure   . Left pontine cerebrovascular accident ([place]) 06/20/2019  . Acute ischemic stroke ([place])   . Right hemiparesis ([place])   . Dysphagia, post-stroke   . Basilar artery stenosis with infarction [facility]) s/p tPA 06/19/2019  . Stroke (cerebrum) ([place]) L pontine d/t BA stenosis 06/14/2019  . Subcutaneous nodule of left foot 01/03/2019  . Atopic dermatitis 10/18/2018  .  Seasonal allergies 08/09/2018  . 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 Osteoarthritis of carpometacarpal (CMC) joint of thumb 11/07/2017  . Acquired hypothyroidism 08/04/2015  . Essential hypertension 08/04/2015  . GERD without esophagitis 08/04/2015  . Mixed hyperlipidemia 08/04/2015  . Osteopenia 08/04/2015  . Primary insomnia 08/04/2015  . Pure hypertriglyceridemia 08/04/2015   Amador Cunas, PT, DPT Donald Prose Xianna Siverling 10/10/2019, 9:37 AM  [place] [address] [place], [place], [address] Phone: [phone]   Fax:  [phone]  Name: Megan [facility] MRN: [id number] Date of Birth: 1950-06-17

## 2019-10-10 NOTE — Telephone Encounter (Signed)
Left message for patient to schedule Annual Wellness Visit. AWVI  Please schedule with Nurse Health Advisor Naaman Plummer, RN at [employer]

## 2019-10-16 ENCOUNTER — Encounter: Payer: Self-pay | Admitting: Physical Therapy

## 2019-10-16 ENCOUNTER — Other Ambulatory Visit: Payer: Self-pay

## 2019-10-16 ENCOUNTER — Ambulatory Visit: Payer: PPO | Admitting: Physical Therapy

## 2019-10-16 DIAGNOSIS — M6281 Muscle weakness (generalized): Secondary | ICD-10-CM

## 2019-10-16 DIAGNOSIS — R262 Difficulty in walking, not elsewhere classified: Secondary | ICD-10-CM

## 2019-10-16 DIAGNOSIS — R260 Ataxic gait: Secondary | ICD-10-CM

## 2019-10-16 NOTE — Therapy (Signed)
[address] [place], [place], [address] Phone: [phone]   Fax:  [phone]  Physical Therapy Treatment  Patient Details  Name: Megan Martinez MRN: [id number] Date of Birth: [birth year] Referring Provider (PT): Princella Ion   Encounter Date: 10/16/2019  PT End of Session - 10/16/19 1346    Visit Number  8    Date for PT Re-Evaluation  11/17/19    PT Start Time  1300    PT Stop Time  1345    PT Time Calculation (min)  45 min    Activity Tolerance  Patient tolerated treatment well    Behavior During Therapy  [facility] for tasks assessed/performed       Past Medical History:  Diagnosis Date  . Allergy   . Arthritis   . GERD (gastroesophageal reflux disease)   . Hypertension   . Hypothyroidism   . Pneumonia   . Stroke [facility])    right arm is "not working", speech a little slurred  . Thyroid disease     Past Surgical History:  Procedure Laterality Date  . ABDOMINAL HYSTERECTOMY    . APPENDECTOMY    . BREAST SURGERY Right    lumpectomy  . CATARACT EXTRACTION    . CHOLECYSTECTOMY    . IR ANGIO INTRA EXTRACRAN SEL COM CAROTID INNOMINATE BILAT MOD SED  07/24/2019  . IR ANGIO VERTEBRAL SEL SUBCLAVIAN INNOMINATE UNI L MOD SED  07/24/2019  . IR INTRA CRAN STENT  07/24/2019  . RADIOLOGY WITH ANESTHESIA N/A 07/09/2019   Procedure: RADIOLOGY WITH ANESTHESIA STENT PLACEMENT;  Surgeon: Luanne Bras, MD;  Location: [place];  Service: Radiology;  Laterality: N/A;  . RADIOLOGY WITH ANESTHESIA N/A 07/23/2019   Procedure: RADIOLOGY WITH ANESTHESIA STENT PLACEMENT;  Surgeon: Luanne Bras, MD;  Location: [place];  Service: Radiology;  Laterality: N/A;  . RADIOLOGY WITH ANESTHESIA N/A 07/24/2019   Procedure: RADIOLOGY WITH ANESTHESIA STENT PLACEMENT;  Surgeon: Luanne Bras, MD;  Location: [place];  Service: Radiology;  Laterality: N/A;  . TUBAL LIGATION      There were no vitals filed for this visit.  Subjective  Assessment - 10/16/19 1256    Subjective  Pt reports that she fell Saturday going up a step and holding a cat. She stated that she fell on her R side on her hip and shoulder. Reports some hip busing    Currently in Pain?  Yes    Pain Score  2     Pain Location  Hip    Pain Orientation  Right                        [facility] Adult PT Treatment/Exercise - 10/16/19 0001      Ambulation/Gait   Ambulation/Gait  Yes    Ambulation/Gait Assistance  6: Modified independent (Device/Increase time)    Ambulation Distance (Feet)  1500 Feet    Assistive device  None    Gait Pattern  Step-through pattern;Decreased arm swing - right;Decreased step length - left;Decreased stance time - right;Decreased hip/knee flexion - right;Decreased dorsiflexion - right;Decreased weight shift to right;Poor foot clearance - right    Ambulation Surface  Unlevel;Indoor;Outdoor;Paved;Gravel;Level    Stairs  Yes    Stairs Assistance  5: Supervision;4: Min guard    Stair Management Technique  One rail Left;Alternating pattern;Step to pattern    Number of Stairs  24    Height of Stairs  6  Gait Comments  gait with SBQC outside negotiating curbs and uneven terrain      Knee/Hip Exercises: Aerobic   Nustep  level 5 x 7 min    Other Aerobic  UBE level 3 x 4 minutes change directions every minute      Knee/Hip Exercises: Machines for Strengthening   Cybex Knee Extension  5# x10, then [employer] x10  RLE    Cybex Knee Flexion  15# 2x10 RLE    Cybex Leg Press  40# BLE 2x10, 20# 1x10      Knee/Hip Exercises: Standing   Lateral Step Up  Both;1 set;5 reps;Hand Hold: 0;Step Height: 4"               PT Short Term Goals - 09/17/19 1012      PT SHORT TERM GOAL #1   Title  Independent with initial HEP    Time  4    Period  Weeks    Status  New        PT Long Term Goals - 10/01/19 1055      PT LONG TERM GOAL #2   Title  walk without device on uneven surfaces safely    Status  On-going      PT  LONG TERM GOAL #3   Title  go up and down stairs step over step    Status  On-going            Plan - 10/16/19 1347    Clinical Impression Statement  Good carryover from last session ambulating on uneven and non compliant surface. Prefers a step to pattern descending stairs leading with her LLE controlling decent with R. weak R hip flexor noted ascending stairs.    Examination-Activity Limitations  Bathing;Self Feeding;Carry;Squat;Stairs;Dressing;Stand;Toileting;Hygiene/Grooming;Lift;Transfers;Locomotion Level    Examination-Participation Restrictions  Cleaning;Meal Prep;Driving;Shop;Laundry;Yard Work    Stability/Clinical Decision Making  Evolving/Moderate complexity    Rehab Potential  Good    PT Duration  12 weeks    PT Treatment/Interventions  ADLs/Self Care Home Management;Gait training;Neuromuscular re-education;Balance training;Therapeutic exercise;Therapeutic activities;Functional mobility training;Stair training;Patient/family education;Manual techniques    PT Next Visit Plan  work on ROM. strength and function; incorporate R UE strengthening and stretching       Patient will benefit from skilled therapeutic intervention in order to improve the following deficits and impairments:  Abnormal gait, Decreased coordination, Decreased range of motion, Difficulty walking, Decreased endurance, Cardiopulmonary status limiting activity, Decreased activity tolerance, Decreased balance, Impaired flexibility, Improper body mechanics, Decreased mobility, Decreased strength, Postural dysfunction  Visit Diagnosis: Difficulty in walking, not elsewhere classified  Ataxic gait  Muscle weakness (generalized)     Problem List Patient Active Problem List   Diagnosis Date Noted  . 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 Elevated LDL cholesterol level 08/07/2019  . Type 2 diabetes mellitus with hyperglycemia, without long-term current use of  insulin ([place]) 08/07/2019  . H/O cerebral artery stenosis 07/24/2019  . 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 Abnormality of gait 07/19/2019  . Right wrist drop   . Right foot drop   . Hypokalemia   . Stenosis of artery ([place])   . Benign essential HTN   . Stage 2 chronic kidney disease   . Prediabetes   . Transaminitis   . Hypoalbuminemia due to protein-calorie malnutrition ([place])   . Labile blood pressure   . Left pontine cerebrovascular accident ([place]) 06/20/2019  . Acute ischemic stroke ([place])   .  Right hemiparesis ([place])   . Dysphagia, post-stroke   . Basilar artery stenosis with infarction [facility]) s/p tPA 06/19/2019  . Stroke (cerebrum) ([place]) L pontine d/t BA stenosis 06/14/2019  . Subcutaneous nodule of left foot 01/03/2019  . Atopic dermatitis 10/18/2018  . Seasonal allergies 08/09/2018  . 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 Osteoarthritis of carpometacarpal (CMC) joint of thumb 11/07/2017  . Acquired hypothyroidism 08/04/2015  . Essential hypertension 08/04/2015  . GERD without esophagitis 08/04/2015  . Mixed hyperlipidemia 08/04/2015  . Osteopenia 08/04/2015  . Primary insomnia 08/04/2015  . Pure hypertriglyceridemia 08/04/2015    Scot Jun, PTA 10/16/2019, 1:49 PM  [address] [place], [place], [address] Phone: [phone]   Fax:  [phone]  Name: ELLYSE Martinez MRN: [id number] Date of Birth: 03-21-1951

## 2019-10-18 ENCOUNTER — Other Ambulatory Visit: Payer: Self-pay

## 2019-10-18 ENCOUNTER — Encounter: Payer: Self-pay | Admitting: Physical Therapy

## 2019-10-18 ENCOUNTER — Ambulatory Visit: Payer: PPO | Admitting: Physical Therapy

## 2019-10-18 DIAGNOSIS — R262 Difficulty in walking, not elsewhere classified: Secondary | ICD-10-CM

## 2019-10-18 DIAGNOSIS — R260 Ataxic gait: Secondary | ICD-10-CM

## 2019-10-18 DIAGNOSIS — I69351 Hemiplegia and hemiparesis following cerebral infarction affecting right dominant side: Secondary | ICD-10-CM

## 2019-10-18 DIAGNOSIS — M6281 Muscle weakness (generalized): Secondary | ICD-10-CM

## 2019-10-18 NOTE — Therapy (Signed)
[address] [place], [place], [address] Phone: [phone]   Fax:  [phone]  Physical Therapy Treatment  Patient Details  Name: Megan Martinez MRN: [id number] Date of Birth: Mar 11, 1951 Referring Provider (PT): Princella Ion   Encounter Date: 10/18/2019  PT End of Session - 10/18/19 1141    Visit Number  9    Date for PT Re-Evaluation  11/17/19    PT Start Time  1100    PT Stop Time  1144    PT Time Calculation (min)  44 min    Activity Tolerance  Patient tolerated treatment well    Behavior During Therapy  [facility] for tasks assessed/performed       Past Medical History:  Diagnosis Date  . Allergy   . Arthritis   . GERD (gastroesophageal reflux disease)   . Hypertension   . Hypothyroidism   . Pneumonia   . Stroke [facility])    right arm is "not working", speech a little slurred  . Thyroid disease     Past Surgical History:  Procedure Laterality Date  . ABDOMINAL HYSTERECTOMY    . APPENDECTOMY    . BREAST SURGERY Right    lumpectomy  . CATARACT EXTRACTION    . CHOLECYSTECTOMY    . IR ANGIO INTRA EXTRACRAN SEL COM CAROTID INNOMINATE BILAT MOD SED  07/24/2019  . IR ANGIO VERTEBRAL SEL SUBCLAVIAN INNOMINATE UNI L MOD SED  07/24/2019  . IR INTRA CRAN STENT  07/24/2019  . RADIOLOGY WITH ANESTHESIA N/A 07/09/2019   Procedure: RADIOLOGY WITH ANESTHESIA STENT PLACEMENT;  Surgeon: Luanne Bras, MD;  Location: [place];  Service: Radiology;  Laterality: N/A;  . RADIOLOGY WITH ANESTHESIA N/A 07/23/2019   Procedure: RADIOLOGY WITH ANESTHESIA STENT PLACEMENT;  Surgeon: Luanne Bras, MD;  Location: [place];  Service: Radiology;  Laterality: N/A;  . RADIOLOGY WITH ANESTHESIA N/A 07/24/2019   Procedure: RADIOLOGY WITH ANESTHESIA STENT PLACEMENT;  Surgeon: Luanne Bras, MD;  Location: [place];  Service: Radiology;  Laterality: N/A;  . TUBAL LIGATION      There were no vitals filed for this visit.  Subjective  Assessment - 10/18/19 1100    Subjective  "Im just tired" Pt reports that she may be over doing it at home with her exercises    Currently in Pain?  Yes    Pain Score  2     Pain Location  Shoulder    Pain Orientation  Right                        [facility] Adult PT Treatment/Exercise - 10/18/19 0001      High Level Balance   High Level Balance Comments  On airex eyes closed 3 x 10 sec      Knee/Hip Exercises: Aerobic   Nustep  level 5 x 7 min    Other Aerobic  UBE level 1 x 4 minutes change directions every minute      Knee/Hip Exercises: Machines for Strengthening   Cybex Knee Extension  10lb 2x5, 5lb x 10 all bilat     Cybex Knee Flexion  25lb 2x10, RLE 15lb x10        Knee/Hip Exercises: Standing   Forward Step Up  Both;2 sets;Hand Hold: 1;5 reps;Step Height: 6"    Walking with Sports Cord  10lb forward and side to side 5eaxh      Knee/Hip Exercises: Seated   Sit to [employer]  10 reps;without UE support;2 sets   blue chair      Shoulder Exercises: Standing   Extension  Right;10 reps;Weights    Extension Weight (lbs)  5               PT Short Term Goals - 09/17/19 1012      PT SHORT TERM GOAL #1   Title  Independent with initial HEP    Time  4    Period  Weeks    Status  New        PT Long Term Goals - 10/01/19 1055      PT LONG TERM GOAL #2   Title  walk without device on uneven surfaces safely    Status  On-going      PT LONG TERM GOAL #3   Title  go up and down stairs step over step    Status  On-going            Plan - 10/18/19 1145    Clinical Impression Statement  Pt did well overall today. Just a little instability with resisted side step. No issues with eyes closed on airex. both quads did fatigue quick with extensions. RUE difficulty with extension.    Personal Factors and Comorbidities  Comorbidity 3+    Comorbidities  CVA, HTN, GERD    Examination-Activity Limitations  Bathing;Self  Feeding;Carry;Squat;Stairs;Dressing;Stand;Toileting;Hygiene/Grooming;Lift;Transfers;Locomotion Level    Stability/Clinical Decision Making  Evolving/Moderate complexity    Rehab Potential  Good    PT Frequency  2x / week    PT Duration  12 weeks    PT Next Visit Plan  work on ROM. strength and function; incorporate R UE strengthening and stretching       Patient will benefit from skilled therapeutic intervention in order to improve the following deficits and impairments:  Abnormal gait, Decreased coordination, Decreased range of motion, Difficulty walking, Decreased endurance, Cardiopulmonary status limiting activity, Decreased activity tolerance, Decreased balance, Impaired flexibility, Improper body mechanics, Decreased mobility, Decreased strength, Postural dysfunction  Visit Diagnosis: Ataxic gait  Muscle weakness (generalized)  Difficulty in walking, not elsewhere classified  Hemiplegia and hemiparesis following cerebral infarction affecting right dominant side [facility])     Problem List Patient Active Problem List   Diagnosis Date Noted  . 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 Elevated LDL cholesterol level 08/07/2019  . Type 2 diabetes mellitus with hyperglycemia, without long-term current use of insulin ([place]) 08/07/2019  . H/O cerebral artery stenosis 07/24/2019  . 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 Abnormality of gait 07/19/2019  . Right wrist drop   . Right foot drop   . Hypokalemia   . Stenosis of artery ([place])   . Benign essential HTN   . Stage 2 chronic kidney disease   . Prediabetes   . Transaminitis   . Hypoalbuminemia due to protein-calorie malnutrition ([place])   . Labile blood pressure   . Left pontine cerebrovascular accident ([place]) 06/20/2019  . Acute ischemic stroke ([place])   . Right hemiparesis ([place])   . Dysphagia, post-stroke   . Basilar artery stenosis with  infarction [facility]) s/p tPA 06/19/2019  . Stroke (cerebrum) ([place]) L pontine d/t BA stenosis 06/14/2019  . Subcutaneous nodule of left foot 01/03/2019  . Atopic dermatitis 10/18/2018  . Seasonal allergies 08/09/2018  . 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 Osteoarthritis of carpometacarpal (CMC) joint of thumb 11/07/2017  .  Acquired hypothyroidism 08/04/2015  . Essential hypertension 08/04/2015  . GERD without esophagitis 08/04/2015  . Mixed hyperlipidemia 08/04/2015  . Osteopenia 08/04/2015  . Primary insomnia 08/04/2015  . Pure hypertriglyceridemia 08/04/2015    Scot Jun, PTA 10/18/2019, 11:47 AM  [address] [place], [place], [address] Phone: [phone]   Fax:  [phone]  Name: BOBBI CLAES MRN: [id number] Date of Birth: 07-Aug-1950

## 2019-10-24 ENCOUNTER — Encounter: Payer: PPO | Admitting: Physical Therapy

## 2019-10-25 ENCOUNTER — Ambulatory Visit: Payer: PPO | Attending: Adult Health | Admitting: Physical Therapy

## 2019-10-25 ENCOUNTER — Encounter: Payer: Self-pay | Admitting: Physical Therapy

## 2019-10-25 ENCOUNTER — Other Ambulatory Visit: Payer: Self-pay

## 2019-10-25 DIAGNOSIS — R260 Ataxic gait: Secondary | ICD-10-CM | POA: Insufficient documentation

## 2019-10-25 DIAGNOSIS — I69351 Hemiplegia and hemiparesis following cerebral infarction affecting right dominant side: Secondary | ICD-10-CM | POA: Insufficient documentation

## 2019-10-25 DIAGNOSIS — M6281 Muscle weakness (generalized): Secondary | ICD-10-CM | POA: Insufficient documentation

## 2019-10-25 DIAGNOSIS — R262 Difficulty in walking, not elsewhere classified: Secondary | ICD-10-CM | POA: Insufficient documentation

## 2019-10-25 NOTE — Therapy (Signed)
[address] [place], [place], [address] Phone: [phone]   Fax:  [phone] Progress Note Reporting Period 09/17/19 to 10/25/19 for the first 10 visits  See note below for Objective Data and Assessment of Progress/Goals.      Physical Therapy Treatment  Patient Details  Name: Megan Martinez MRN: [id number] Date of Birth: 02-23-1951 Referring Provider (PT): Princella Ion   Encounter Date: 10/25/2019  PT End of Session - 10/25/19 0925    Visit Number  10    Date for PT Re-Evaluation  11/17/19    PT Start Time  0845    PT Stop Time  0928    PT Time Calculation (min)  43 min    Activity Tolerance  Patient tolerated treatment well    Behavior During Therapy  [facility] for tasks assessed/performed       Past Medical History:  Diagnosis Date  . Allergy   . Arthritis   . GERD (gastroesophageal reflux disease)   . Hypertension   . Hypothyroidism   . Pneumonia   . Stroke [facility])    right arm is "not working", speech a little slurred  . Thyroid disease     Past Surgical History:  Procedure Laterality Date  . ABDOMINAL HYSTERECTOMY    . APPENDECTOMY    . BREAST SURGERY Right    lumpectomy  . CATARACT EXTRACTION    . CHOLECYSTECTOMY    . IR ANGIO INTRA EXTRACRAN SEL COM CAROTID INNOMINATE BILAT MOD SED  07/24/2019  . IR ANGIO VERTEBRAL SEL SUBCLAVIAN INNOMINATE UNI L MOD SED  07/24/2019  . IR INTRA CRAN STENT  07/24/2019  . RADIOLOGY WITH ANESTHESIA N/A 07/09/2019   Procedure: RADIOLOGY WITH ANESTHESIA STENT PLACEMENT;  Surgeon: Luanne Bras, MD;  Location: [place];  Service: Radiology;  Laterality: N/A;  . RADIOLOGY WITH ANESTHESIA N/A 07/23/2019   Procedure: RADIOLOGY WITH ANESTHESIA STENT PLACEMENT;  Surgeon: Luanne Bras, MD;  Location: [place];  Service: Radiology;  Laterality: N/A;  . RADIOLOGY WITH ANESTHESIA N/A 07/24/2019   Procedure: RADIOLOGY WITH ANESTHESIA STENT PLACEMENT;  Surgeon: Luanne Bras, MD;  Location: [place];  Service: Radiology;  Laterality: N/A;  . TUBAL LIGATION      There were no vitals filed for this visit.  Subjective Assessment - 10/25/19 0848    Subjective  "Tired" pt reports that she has been sweeping, cleaning the car port, washing dishes    Currently in Pain?  Yes    Pain Score  3     Pain Location  Shoulder    Pain Orientation  Right         [facility] PT Assessment - 10/25/19 0001      AROM   Right Ankle Dorsiflexion  0      Timed Up and Go Test   TUG  Normal TUG    Normal TUG (seconds)  12.01                    [facility] Adult PT Treatment/Exercise - 10/25/19 0001      Ambulation/Gait   Stairs  Yes    Stairs Assistance  6: Modified independent (Device/Increase time);5: Supervision    Stair Management Technique  One rail Left;Alternating pattern;Step to pattern    Number of Stairs  36    Height of Stairs  6      Knee/Hip Exercises: Aerobic   Nustep  level 5 x 5 min  Other Aerobic  UBE level 1 x 4 minutes change directions every minute      Knee/Hip Exercises: Machines for Strengthening   Cybex Knee Extension  10lb x10, 5lb x10, RLE 5lb x5     Cybex Knee Flexion  25lb 2x10, RLE 10lb x10      Cybex Leg Press  40# BLE 2x15,  RLE 20# 1x10      Knee/Hip Exercises: Seated   Long Arc Quad  Right;3 sets;10 reps;Weights    Long Arc Quad Weight  2 lbs.               PT Short Term Goals - 10/25/19 0928      PT SHORT TERM GOAL #1   Title  Independent with initial HEP    Status  Achieved        PT Long Term Goals - 10/25/19 0904      PT LONG TERM GOAL #3   Title  go up and down stairs step over step    Status  Achieved            Plan - 10/25/19 0925    Clinical Impression Statement  Pt is doing well and has progressed towards goals. She has decrease her TUG time and well as negotiated stairs with alternating pattern. Difficulty with machine level RLE extension but able to complete with cuff weights. LE  fatigue noted with leg press today.    Personal Factors and Comorbidities  Comorbidity 3+    Comorbidities  CVA, HTN, GERD    Examination-Activity Limitations  Bathing;Self Feeding;Carry;Squat;Stairs;Dressing;Stand;Toileting;Hygiene/Grooming;Lift;Transfers;Locomotion Level    Stability/Clinical Decision Making  Evolving/Moderate complexity    Rehab Potential  Good    PT Frequency  2x / week    PT Duration  12 weeks    PT Treatment/Interventions  ADLs/Self Care Home Management;Gait training;Neuromuscular re-education;Balance training;Therapeutic exercise;Therapeutic activities;Functional mobility training;Stair training;Patient/family education;Manual techniques    PT Next Visit Plan  work on ROM. strength and function; incorporate R UE strengthening and stretching       Patient will benefit from skilled therapeutic intervention in order to improve the following deficits and impairments:  Abnormal gait, Decreased coordination, Decreased range of motion, Difficulty walking, Decreased endurance, Cardiopulmonary status limiting activity, Decreased activity tolerance, Decreased balance, Impaired flexibility, Improper body mechanics, Decreased mobility, Decreased strength, Postural dysfunction  Visit Diagnosis: Difficulty in walking, not elsewhere classified  Muscle weakness (generalized)  Ataxic gait     Problem List Patient Active Problem List   Diagnosis Date Noted  . 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 Elevated LDL cholesterol level 08/07/2019  . Type 2 diabetes mellitus with hyperglycemia, without long-term current use of insulin ([place]) 08/07/2019  . H/O cerebral artery stenosis 07/24/2019  . 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 Abnormality of gait 07/19/2019  . Right wrist drop   . Right foot drop   . Hypokalemia   . Stenosis of artery ([place])   . Benign essential HTN   . Stage 2  chronic kidney disease   . Prediabetes   . Transaminitis   . Hypoalbuminemia due to protein-calorie malnutrition ([place])   . Labile blood pressure   . Left pontine cerebrovascular accident ([place]) 06/20/2019  . Acute ischemic stroke ([place])   . Right hemiparesis ([place])   . Dysphagia, post-stroke   . Basilar artery stenosis with infarction [facility]) s/p tPA 06/19/2019  . Stroke (cerebrum) ([facility]) L pontine d/t BA  stenosis 06/14/2019  . Subcutaneous nodule of left foot 01/03/2019  . Atopic dermatitis 10/18/2018  . Seasonal allergies 08/09/2018  . 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 Osteoarthritis of carpometacarpal (CMC) joint of thumb 11/07/2017  . Acquired hypothyroidism 08/04/2015  . Essential hypertension 08/04/2015  . GERD without esophagitis 08/04/2015  . Mixed hyperlipidemia 08/04/2015  . Osteopenia 08/04/2015  . Primary insomnia 08/04/2015  . Pure hypertriglyceridemia 08/04/2015    Scot Jun, PTA 10/25/2019, 9:28 AM  [address] [place], [place], [address] Phone: [phone]   Fax:  [phone]  Name: Megan Martinez MRN: [id number] Date of Birth: 1951/02/11

## 2019-10-30 ENCOUNTER — Ambulatory Visit: Payer: PPO | Admitting: Physical Therapy

## 2019-10-30 ENCOUNTER — Other Ambulatory Visit: Payer: Self-pay

## 2019-10-30 DIAGNOSIS — R260 Ataxic gait: Secondary | ICD-10-CM

## 2019-10-30 DIAGNOSIS — M6281 Muscle weakness (generalized): Secondary | ICD-10-CM

## 2019-10-30 DIAGNOSIS — R262 Difficulty in walking, not elsewhere classified: Secondary | ICD-10-CM | POA: Diagnosis not present

## 2019-10-30 DIAGNOSIS — I69351 Hemiplegia and hemiparesis following cerebral infarction affecting right dominant side: Secondary | ICD-10-CM

## 2019-10-30 NOTE — Therapy (Signed)
[address], [place], [address] Phone: [phone]   Fax:  [phone]  Physical Therapy Treatment  Patient Details  Name: Megan Martinez MRN: [id number] Date of Birth: [birth year] Referring Provider (PT): Princella Ion   Encounter Date: 10/30/2019  PT End of Session - 10/30/19 1047    Visit Number  11    Date for PT Re-Evaluation  11/17/19    PT Start Time  1011    PT Stop Time  1055    PT Time Calculation (min)  44 min       Past Medical History:  Diagnosis Date  . Allergy   . Arthritis   . GERD (gastroesophageal reflux disease)   . Hypertension   . Hypothyroidism   . Pneumonia   . Stroke [facility])    right arm is "not working", speech a little slurred  . Thyroid disease     Past Surgical History:  Procedure Laterality Date  . ABDOMINAL HYSTERECTOMY    . APPENDECTOMY    . BREAST SURGERY Right    lumpectomy  . CATARACT EXTRACTION    . CHOLECYSTECTOMY    . IR ANGIO INTRA EXTRACRAN SEL COM CAROTID INNOMINATE BILAT MOD SED  07/24/2019  . IR ANGIO VERTEBRAL SEL SUBCLAVIAN INNOMINATE UNI L MOD SED  07/24/2019  . IR INTRA CRAN STENT  07/24/2019  . RADIOLOGY WITH ANESTHESIA N/A 07/09/2019   Procedure: RADIOLOGY WITH ANESTHESIA STENT PLACEMENT;  Surgeon: Luanne Bras, MD;  Location: [place];  Service: Radiology;  Laterality: N/A;  . RADIOLOGY WITH ANESTHESIA N/A 07/23/2019   Procedure: RADIOLOGY WITH ANESTHESIA STENT PLACEMENT;  Surgeon: Luanne Bras, MD;  Location: [place];  Service: Radiology;  Laterality: N/A;  . RADIOLOGY WITH ANESTHESIA N/A 07/24/2019   Procedure: RADIOLOGY WITH ANESTHESIA STENT PLACEMENT;  Surgeon: Luanne Bras, MD;  Location: [place];  Service: Radiology;  Laterality: N/A;  . TUBAL LIGATION      There were no vitals filed for this visit.  Subjective Assessment - 10/30/19 1008    Subjective  doing okay, arm always causes me pain    Currently in Pain?  Yes    Pain  Score  3     Pain Location  Shoulder    Pain Orientation  Right                        [facility] Adult PT Treatment/Exercise - 10/30/19 0001      Knee/Hip Exercises: Aerobic   Nustep  level 5 x 6 min    Other Aerobic  UBE level 1 x 4 minutes change directions every minute      Knee/Hip Exercises: Machines for Strengthening   Cybex Knee Extension  5# 2 sets 10    Cybex Knee Flexion  25lb 2x10- cued to work legs =    Cybex Leg Press  40# BLE 2x15,  RLE 20# 1x10      Knee/Hip Exercises: Standing   Forward Step Up  Hand Hold: 1;Step Height: 6";Right;1 set;10 reps   CGA   Walking with Sports Cord  15# 5x  forward/back and 3 x each side    min A   Other Standing Knee Exercises  3# picking RT foot up and over foam roll fwd/back and SW 10 x each   SBA -difficult for pt     Knee/Hip Exercises: Seated   Long Arc Quad  Right;3 sets;10 reps;Weights    Long  Arc Quad Weight  3 lbs.    Hamstring Curl  Strengthening;Right;3 sets;10 reps   green tband   Sit to Sand  3 sets;5 reps;without UE support   from elevated UBE on airex with wt ball              PT Short Term Goals - 10/25/19 5400      PT SHORT TERM GOAL #1   Title  Independent with initial HEP    Status  Achieved        PT Long Term Goals - 10/25/19 0904      PT LONG TERM GOAL #3   Title  go up and down stairs step over step    Status  Achieved            Plan - 10/30/19 1048    Clinical Impression Statement  pt tolerated ther ex well. changed to SL HS and LAQ to increase muscle isolation and ROM. pt with fatgue with Step ups and required CGA for saftey. difficulty picking RT foot up to clear foam roll. pt motivated an dowrks hard.    PT Treatment/Interventions  ADLs/Self Care Home Management;Gait training;Neuromuscular re-education;Balance training;Therapeutic exercise;Therapeutic activities;Functional mobility training;Stair training;Patient/family education;Manual techniques    PT Next Visit  Plan  progress gait,balance and func strength on RT UE/LE       Patient will benefit from skilled therapeutic intervention in order to improve the following deficits and impairments:  Abnormal gait, Decreased coordination, Decreased range of motion, Difficulty walking, Decreased endurance, Cardiopulmonary status limiting activity, Decreased activity tolerance, Decreased balance, Impaired flexibility, Improper body mechanics, Decreased mobility, Decreased strength, Postural dysfunction  Visit Diagnosis: Muscle weakness (generalized)  Difficulty in walking, not elsewhere classified  Ataxic gait  Hemiplegia and hemiparesis following cerebral infarction affecting right dominant side [facility])     Problem List Patient Active Problem List   Diagnosis Date Noted  . 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 Elevated LDL cholesterol level 08/07/2019  . Type 2 diabetes mellitus with hyperglycemia, without long-term current use of insulin ([place]) 08/07/2019  . H/O cerebral artery stenosis 07/24/2019  . 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 Abnormality of gait 07/19/2019  . Right wrist drop   . Right foot drop   . Hypokalemia   . Stenosis of artery ([place])   . Benign essential HTN   . Stage 2 chronic kidney disease   . Prediabetes   . Transaminitis   . Hypoalbuminemia due to protein-calorie malnutrition ([place])   . Labile blood pressure   . Left pontine cerebrovascular accident ([place]) 06/20/2019  . Acute ischemic stroke ([place])   . Right hemiparesis ([place])   . Dysphagia, post-stroke   . 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 Stroke (cerebrum) ([place]) L pontine d/t BA stenosis 06/14/2019  . Subcutaneous nodule of left foot 01/03/2019  . Atopic dermatitis 10/18/2018  . Seasonal allergies 08/09/2018  . 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  Osteoarthritis of carpometacarpal (CMC) joint of thumb 11/07/2017  . Acquired hypothyroidism 08/04/2015  . Essential hypertension 08/04/2015  . GERD without esophagitis 08/04/2015  . Mixed hyperlipidemia 08/04/2015  . Osteopenia 08/04/2015  . Primary insomnia 08/04/2015  . Pure hypertriglyceridemia 08/04/2015    Tameika Heckmann,ANGIE PTA 10/30/2019, 10:51 AM  [place] [place] [place], [place], [address] Phone: [phone]   Fax:  [phone]  Name: Megan Martinez MRN: [id number] Date of Birth: 01/25/1951

## 2019-11-06 ENCOUNTER — Ambulatory Visit: Payer: PPO | Admitting: Physical Therapy

## 2019-11-06 ENCOUNTER — Other Ambulatory Visit: Payer: Self-pay

## 2019-11-06 DIAGNOSIS — M6281 Muscle weakness (generalized): Secondary | ICD-10-CM

## 2019-11-06 DIAGNOSIS — R262 Difficulty in walking, not elsewhere classified: Secondary | ICD-10-CM | POA: Diagnosis not present

## 2019-11-06 NOTE — Therapy (Signed)
[address], [place], [address] Phone: [phone]   Fax:  [phone]  Physical Therapy Treatment  Patient Details  Name: Megan Martinez MRN: [id number] Date of Birth: 12-19-1950 Referring Provider (PT): Princella Ion   Encounter Date: 11/06/2019   PT End of Session - 11/06/19 1007    Visit Number 12    Date for PT Re-Evaluation 11/17/19    PT Start Time 0928    PT Stop Time 1011    PT Time Calculation (min) 43 min           Past Medical History:  Diagnosis Date  . Allergy   . Arthritis   . GERD (gastroesophageal reflux disease)   . Hypertension   . Hypothyroidism   . Pneumonia   . Stroke [facility])    right arm is "not working", speech a little slurred  . Thyroid disease     Past Surgical History:  Procedure Laterality Date  . ABDOMINAL HYSTERECTOMY    . APPENDECTOMY    . BREAST SURGERY Right    lumpectomy  . CATARACT EXTRACTION    . CHOLECYSTECTOMY    . IR ANGIO INTRA EXTRACRAN SEL COM CAROTID INNOMINATE BILAT MOD SED  07/24/2019  . IR ANGIO VERTEBRAL SEL SUBCLAVIAN INNOMINATE UNI L MOD SED  07/24/2019  . IR INTRA CRAN STENT  07/24/2019  . RADIOLOGY WITH ANESTHESIA N/A 07/09/2019   Procedure: RADIOLOGY WITH ANESTHESIA STENT PLACEMENT;  Surgeon: Luanne Bras, MD;  Location: [place];  Service: Radiology;  Laterality: N/A;  . RADIOLOGY WITH ANESTHESIA N/A 07/23/2019   Procedure: RADIOLOGY WITH ANESTHESIA STENT PLACEMENT;  Surgeon: Luanne Bras, MD;  Location: [place];  Service: Radiology;  Laterality: N/A;  . RADIOLOGY WITH ANESTHESIA N/A 07/24/2019   Procedure: RADIOLOGY WITH ANESTHESIA STENT PLACEMENT;  Surgeon: Luanne Bras, MD;  Location: [place];  Service: Radiology;  Laterality: N/A;  . TUBAL LIGATION      There were no vitals filed for this visit.   Subjective Assessment - 11/06/19 0927    Subjective "got my 1st Covid shot saturday - I am so tired". pt denies  falls/stumbles when asked- pt states trying to amb some without brace on foot. got a stair lift for basement    Currently in Pain? No/denies                             [facility] Adult PT Treatment/Exercise - 11/06/19 0001      Knee/Hip Exercises: Aerobic   Nustep level 5 x 7 min    Other Aerobic UBE level 2 x 4 minutes change directions every minute      Knee/Hip Exercises: Machines for Strengthening   Cybex Knee Extension 5# 2 sets 10    Cybex Knee Flexion 25lb 2x10- cued to work legs =    Cybex Leg Press 40# BLE 2x15,  RLE 20# 1x10      Knee/Hip Exercises: Standing   Heel Raises Both;15 reps   black bar, toe raises 15x   Lateral Step Up Right;10 reps;Hand Hold: 1;Step Height: 6"   CGA   Forward Step Up Right;10 reps;Hand Hold: 1;Step Height: 6"   CGA     Knee/Hip Exercises: Seated   Sit to Sand 2 sets;10 reps;without UE support   from mat, feet on airex     Shoulder Exercises: ROM/Strengthening   Lat Pull 10 reps   15# assistance on  RT side   Cybex Press 10 reps   5# with assistance   Cybex Row 10 reps   153 with cuing   Other ROM/Strengthening Exercises 2# bicep curl 2 sets 10    Other ROM/Strengthening Exercises tricep ext 20# 2 sets 10                    PT Short Term Goals - 10/25/19 4680      PT SHORT TERM GOAL #1   Title Independent with initial HEP    Status Achieved             PT Long Term Goals - 11/06/19 3212      PT LONG TERM GOAL #2   Title walk without device on uneven surfaces safely    Status Partially Met      PT LONG TERM GOAL #4   Title increase right ankle DF to 5 degrees    Baseline RT ankle active DF 8    Status Achieved                 Plan - 11/06/19 0936    Clinical Impression Statement all LTGs met except walking on uneven terrain without AD is inconsistant and pt is trying to go some without ankle brace. pt also expressed concern with RT UE- verb ex at home, added UE ex at home. step ups are getting  easier for pt but still relies on UE    PT Treatment/Interventions ADLs/Self Care Home Management;Gait training;Neuromuscular re-education;Balance training;Therapeutic exercise;Therapeutic activities;Functional mobility training;Stair training;Patient/family education;Manual techniques    PT Next Visit Plan progress gait,balance and func strength on RT UE/LE-gait uneven terrain           Patient will benefit from skilled therapeutic intervention in order to improve the following deficits and impairments:  Abnormal gait, Decreased coordination, Decreased range of motion, Difficulty walking, Decreased endurance, Cardiopulmonary status limiting activity, Decreased activity tolerance, Decreased balance, Impaired flexibility, Improper body mechanics, Decreased mobility, Decreased strength, Postural dysfunction  Visit Diagnosis: Muscle weakness (generalized)  Difficulty in walking, not elsewhere classified     Problem List Patient Active Problem List   Diagnosis Date Noted  . 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 Elevated LDL cholesterol level 08/07/2019  . Type 2 diabetes mellitus with hyperglycemia, without long-term current use of insulin ([place]) 08/07/2019  . H/O cerebral artery stenosis 07/24/2019  . 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 Abnormality of gait 07/19/2019  . Right wrist drop   . Right foot drop   . Hypokalemia   . Stenosis of artery ([place])   . Benign essential HTN   . Stage 2 chronic kidney disease   . Prediabetes   . Transaminitis   . Hypoalbuminemia due to protein-calorie malnutrition ([place])   . Labile blood pressure   . Left pontine cerebrovascular accident ([place]) 06/20/2019  . Acute ischemic stroke ([place])   . Right hemiparesis ([place])   . Dysphagia, post-stroke   . Basilar artery stenosis with infarction [facility]) s/p tPA 06/19/2019  . Stroke (cerebrum) ([place]) L pontine d/t BA  stenosis 06/14/2019  . Subcutaneous nodule of left foot 01/03/2019  . Atopic dermatitis 10/18/2018  . Seasonal allergies 08/09/2018  . 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 Osteoarthritis of carpometacarpal (CMC) joint of thumb 11/07/2017  . Acquired hypothyroidism 08/04/2015  . Essential hypertension 08/04/2015  . GERD without esophagitis 08/04/2015  .  Mixed hyperlipidemia 08/04/2015  . Osteopenia 08/04/2015  . Primary insomnia 08/04/2015  . Pure hypertriglyceridemia 08/04/2015    Dalonda Simoni,ANGIE PTA 11/06/2019, 10:08 AM  [address] [address] [place], [place], [address] Phone: [phone]   Fax:  [phone]  Name: Megan Martinez MRN: [id number] Date of Birth: 10/07/1950

## 2019-11-08 DIAGNOSIS — I69351 Hemiplegia and hemiparesis following cerebral infarction affecting right dominant side: Secondary | ICD-10-CM | POA: Diagnosis not present

## 2019-11-15 ENCOUNTER — Ambulatory Visit: Payer: PPO | Admitting: Physical Therapy

## 2019-11-15 ENCOUNTER — Telehealth: Payer: Self-pay | Admitting: Student

## 2019-11-15 ENCOUNTER — Other Ambulatory Visit: Payer: Self-pay

## 2019-11-15 ENCOUNTER — Encounter: Payer: Self-pay | Admitting: Physical Therapy

## 2019-11-15 DIAGNOSIS — R260 Ataxic gait: Secondary | ICD-10-CM

## 2019-11-15 DIAGNOSIS — M6281 Muscle weakness (generalized): Secondary | ICD-10-CM

## 2019-11-15 DIAGNOSIS — I69351 Hemiplegia and hemiparesis following cerebral infarction affecting right dominant side: Secondary | ICD-10-CM

## 2019-11-15 DIAGNOSIS — R262 Difficulty in walking, not elsewhere classified: Secondary | ICD-10-CM | POA: Diagnosis not present

## 2019-11-15 NOTE — Telephone Encounter (Signed)
Refill request for Plavix 75 mg PO daily faxed to [facility] with 3 refills.   Brynda Greathouse, MS RD PA-C 4:17 PM

## 2019-11-15 NOTE — Therapy (Signed)
[address] [place] [address] [place], [place], [address] Phone: [phone]   Fax:  [phone]  Physical Therapy Treatment  Patient Details  Name: Megan Martinez MRN: [id number] Date of Birth: 24-May-1951 Referring Provider (PT): Princella Ion   Encounter Date: 11/15/2019   PT End of Session - 11/15/19 0838    Visit Number 13    Date for PT Re-Evaluation 11/17/19    PT Start Time 0800    PT Stop Time 0843    PT Time Calculation (min) 43 min    Activity Tolerance Patient tolerated treatment well    Behavior During Therapy [facility] for tasks assessed/performed           Past Medical History:  Diagnosis Date  . Allergy   . Arthritis   . GERD (gastroesophageal reflux disease)   . Hypertension   . Hypothyroidism   . Pneumonia   . Stroke [facility])    right arm is "not working", speech a little slurred  . Thyroid disease     Past Surgical History:  Procedure Laterality Date  . ABDOMINAL HYSTERECTOMY    . APPENDECTOMY    . BREAST SURGERY Right    lumpectomy  . CATARACT EXTRACTION    . CHOLECYSTECTOMY    . IR ANGIO INTRA EXTRACRAN SEL COM CAROTID INNOMINATE BILAT MOD SED  07/24/2019  . IR ANGIO VERTEBRAL SEL SUBCLAVIAN INNOMINATE UNI L MOD SED  07/24/2019  . IR INTRA CRAN STENT  07/24/2019  . RADIOLOGY WITH ANESTHESIA N/A 07/09/2019   Procedure: RADIOLOGY WITH ANESTHESIA STENT PLACEMENT;  Surgeon: Luanne Bras, MD;  Location: [place];  Service: Radiology;  Laterality: N/A;  . RADIOLOGY WITH ANESTHESIA N/A 07/23/2019   Procedure: RADIOLOGY WITH ANESTHESIA STENT PLACEMENT;  Surgeon: Luanne Bras, MD;  Location: [place];  Service: Radiology;  Laterality: N/A;  . RADIOLOGY WITH ANESTHESIA N/A 07/24/2019   Procedure: RADIOLOGY WITH ANESTHESIA STENT PLACEMENT;  Surgeon: Luanne Bras, MD;  Location: [place];  Service: Radiology;  Laterality: N/A;  . TUBAL LIGATION      There were no vitals filed for this visit.    Subjective Assessment - 11/15/19 0802    Subjective "I am feeling all right"    Currently in Pain? Yes    Pain Score 2     Pain Location Shoulder    Pain Orientation Right                             [facility] Adult PT Treatment/Exercise - 11/15/19 0001      Knee/Hip Exercises: Aerobic   Nustep level 4 x 7 min    Other Aerobic UBE level 2 x 4 minutes change directions every minute      Knee/Hip Exercises: Machines for Strengthening   Cybex Knee Extension RLE 5# 2 sets 10    Cybex Knee Flexion 25lb 2x10, RLE 15lb  2x10     Cybex Leg Press 40# BLE 2x15,  RLE 20# 1x10      Knee/Hip Exercises: Standing   Heel Raises Both;15 reps;2 sets    Lateral Step Up 10 reps;Hand Hold: 1;Step Height: 6";Both    Forward Step Up 10 reps;Hand Hold: 1;Step Height: 6";Both;1 set   CGA     Shoulder Exercises: ROM/Strengthening   Lat Pull 1 plate;10 reps    Cybex Row 1.5 plate;20 reps  PT Short Term Goals - 10/25/19 0102      PT SHORT TERM GOAL #1   Title Independent with initial HEP    Status Achieved             PT Long Term Goals - 11/06/19 7253      PT LONG TERM GOAL #2   Title walk without device on uneven surfaces safely    Status Partially Met      PT LONG TERM GOAL #4   Title increase right ankle DF to 5 degrees    Baseline RT ankle active DF 8    Status Achieved                 Plan - 11/15/19 0839    Clinical Impression Statement Most LTG's met, PT reports her only issue is her cats get under her feet at home. She has a upcoming neurologist apt that she thinks her MD will prescribe occupational therapy. From a PT stand point pt reports that she is pleased with her current functional status. No issues with today's interventions    Personal Factors and Comorbidities Comorbidity 3+    Comorbidities CVA, HTN, GERD    Examination-Activity Limitations Bathing;Self  Feeding;Carry;Squat;Stairs;Dressing;Stand;Toileting;Hygiene/Grooming;Lift;Transfers;Locomotion Level    Examination-Participation Restrictions Cleaning;Meal Prep;Driving;Shop;Laundry;Yard Work    [employer] Evolving/Moderate complexity    Rehab Potential Good    PT Frequency 2x / week    PT Duration 12 weeks    PT Treatment/Interventions ADLs/Self Care Home Management;Gait training;Neuromuscular re-education;Balance training;Therapeutic exercise;Therapeutic activities;Functional mobility training;Stair training;Patient/family education;Manual techniques    PT Next Visit Plan D/C PT           Patient will benefit from skilled therapeutic intervention in order to improve the following deficits and impairments:  Abnormal gait, Decreased coordination, Decreased range of motion, Difficulty walking, Decreased endurance, Cardiopulmonary status limiting activity, Decreased activity tolerance, Decreased balance, Impaired flexibility, Improper body mechanics, Decreased mobility, Decreased strength, Postural dysfunction  Visit Diagnosis: Difficulty in walking, not elsewhere classified  Hemiplegia and hemiparesis following cerebral infarction affecting right dominant side (HCC)  Ataxic gait  Muscle weakness (generalized)     Problem List Patient Active Problem List   Diagnosis Date Noted  . 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 Elevated LDL cholesterol level 08/07/2019  . Type 2 diabetes mellitus with hyperglycemia, without long-term current use of insulin ([place]) 08/07/2019  . H/O cerebral artery stenosis 07/24/2019  . 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 Abnormality of gait 07/19/2019  . Right wrist drop   . Right foot drop   . Hypokalemia   . Stenosis of artery ([employer])   . Benign essential HTN   . Stage 2 chronic kidney disease   . Prediabetes   . Transaminitis   .  Hypoalbuminemia due to protein-calorie malnutrition ([place])   . Labile blood pressure   . Left pontine cerebrovascular accident ([place]) 06/20/2019  . Acute ischemic stroke ([place])   . Right hemiparesis ([place])   . Dysphagia, post-stroke   . Basilar artery stenosis with infarction [facility]) s/p tPA 06/19/2019  . Stroke (cerebrum) ([place]) L pontine d/t BA stenosis 06/14/2019  . Subcutaneous nodule of left foot 01/03/2019  . Atopic dermatitis 10/18/2018  . Seasonal allergies 08/09/2018  . 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 Osteoarthritis of carpometacarpal (CMC) joint of thumb 11/07/2017  . Acquired hypothyroidism 08/04/2015  . Essential hypertension 08/04/2015  .  GERD without esophagitis 08/04/2015  . Mixed hyperlipidemia 08/04/2015  . Osteopenia 08/04/2015  . Primary insomnia 08/04/2015  . Pure hypertriglyceridemia 08/04/2015   PHYSICAL THERAPY DISCHARGE SUMMARY  Visits from Start of Care: 13   Plan: Patient agrees to discharge.  Patient goals were partially met. Patient is being discharged due to being pleased with the current functional level.  ?????     Scot Jun, PTA 11/15/2019, 8:43 AM  [address] [place], [place], [address] Phone: [phone]   Fax:  [phone]  Name: Megan Martinez MRN: [id number] Date of Birth: 12/09/1950

## 2019-11-19 ENCOUNTER — Encounter: Payer: Self-pay | Admitting: Physical Medicine & Rehabilitation

## 2019-11-19 ENCOUNTER — Encounter: Payer: PPO | Attending: Physical Medicine & Rehabilitation | Admitting: Physical Medicine & Rehabilitation

## 2019-11-19 ENCOUNTER — Other Ambulatory Visit: Payer: Self-pay

## 2019-11-19 VITALS — BP 150/78 | HR 66 | Temp 98.2°F | Ht 64.0 in | Wt 147.8 lb

## 2019-11-19 DIAGNOSIS — I771 Stricture of artery: Secondary | ICD-10-CM | POA: Diagnosis not present

## 2019-11-19 DIAGNOSIS — R269 Unspecified abnormalities of gait and mobility: Secondary | ICD-10-CM | POA: Diagnosis not present

## 2019-11-19 DIAGNOSIS — I639 Cerebral infarction, unspecified: Secondary | ICD-10-CM

## 2019-11-19 DIAGNOSIS — I6350 Cerebral infarction due to unspecified occlusion or stenosis of unspecified cerebral artery: Secondary | ICD-10-CM | POA: Diagnosis not present

## 2019-11-19 DIAGNOSIS — I10 Essential (primary) hypertension: Secondary | ICD-10-CM | POA: Insufficient documentation

## 2019-11-19 NOTE — Progress Notes (Signed)
Subjective:    Patient ID: Megan Martinez, female    DOB: 10/05/1950, 69 y.o.   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   Last clinic visit on 08/16/2019. Since that time, pt states she is doing better.  She was discharged from therapies recently. She is starting to have movement in her right hand. She followed up with Neurology, notes reviewed.  BP is slightly elevated, but usually WNL per pt and daughter. She had a fall trying to pick her cat up and put her in the house.  No longer using assistive device.   Pain Inventory Average Pain 2 Pain Right Now 0 My pain is intermittent and aching  In the last 24 hours, has pain interfered with the following? General activity 0 Relation with others 0 Enjoyment of life 0 What TIME of day is your pain at its worst? night Sleep (in general) Fair  Pain is worse with: bending and . Pain improves with: rest Relief from Meds: 0  Mobility use a walker ability to climb steps?  no do you drive?  no  Function retired I need assistance with the following:  household duties and shopping  Neuro/Psych No problems in this area  Prior Studies Any changes since last visit?  no  Physicians involved in your care Any changes since last visit?  no   No family history on file. Social History   Socioeconomic History  . Marital status: Married    Spouse name: Not on file  . Number of children: Not on file  . Years of education: Not on file  . Highest education level: Not on file  Occupational History  . Not on file  Tobacco Use  . Smoking status: Former Smoker    Quit date: 1970    Years since quitting: 51.5  . Smokeless tobacco: Never Used  Vaping Use  . Vaping Use: Never used  Substance and Sexual Activity  . Alcohol use: Yes    Comment: occasional  . Drug use: Never  . Sexual  activity: Not on file  Other Topics Concern  . Not on file  Social History Narrative  . Not on file   Social Determinants of Health   Financial Resource Strain:   . Difficulty of Paying Living Expenses:   Food Insecurity:   . Worried About Charity fundraiser in the Last Year:   . Arboriculturist in the Last Year:   Transportation Needs:   . Film/video editor (Medical):   Marland Kitchen Lack of Transportation (Non-Medical):   Physical Activity:   . Days of Exercise per Week:   . Minutes of Exercise per Session:   Stress:   . Feeling of Stress :   Social Connections:   . Frequency of Communication with Friends and Family:   . Frequency of Social Gatherings with Friends and Family:   . Attends Religious Services:   . Active Member of Clubs or Organizations:   . Attends Archivist Meetings:   Marland Kitchen Marital Status:    Past Surgical History:  Procedure Laterality Date  . ABDOMINAL HYSTERECTOMY    . APPENDECTOMY    . BREAST SURGERY Right    lumpectomy  . CATARACT EXTRACTION    . CHOLECYSTECTOMY    . IR ANGIO INTRA EXTRACRAN SEL COM CAROTID INNOMINATE BILAT MOD SED  07/24/2019  . IR  ANGIO VERTEBRAL SEL SUBCLAVIAN INNOMINATE UNI L MOD SED  07/24/2019  . IR INTRA CRAN STENT  07/24/2019  . RADIOLOGY WITH ANESTHESIA N/A 07/09/2019   Procedure: RADIOLOGY WITH ANESTHESIA STENT PLACEMENT;  Surgeon: Luanne Bras, MD;  Location: [place];  Service: Radiology;  Laterality: N/A;  . RADIOLOGY WITH ANESTHESIA N/A 07/23/2019   Procedure: RADIOLOGY WITH ANESTHESIA STENT PLACEMENT;  Surgeon: Luanne Bras, MD;  Location: [place];  Service: Radiology;  Laterality: N/A;  . RADIOLOGY WITH ANESTHESIA N/A 07/24/2019   Procedure: RADIOLOGY WITH ANESTHESIA STENT PLACEMENT;  Surgeon: Luanne Bras, MD;  Location: [place];  Service: Radiology;  Laterality: N/A;  . TUBAL LIGATION     Past Medical History:  Diagnosis Date  . Allergy   . Arthritis   . GERD (gastroesophageal reflux disease)   .  Hypertension   . Hypothyroidism   . Pneumonia   . Stroke [facility])    right arm is "not working", speech a little slurred  . Thyroid disease    BP (!) 150/78   Pulse 66   Temp 98.2 F (36.8 C)   Ht 5\' 4"  (1.626 m)   Wt 147 lb 12.8 oz (67 kg)   SpO2 97%   BMI 25.37 kg/m   Opioid Risk Score:   Fall Risk Score:  `1  Depression screen PHQ 2/9  Depression screen PHQ 2/9 11/19/2019  Decreased Interest 0  Down, Depressed, Hopeless 0  PHQ - 2 Score 0     Review of Systems  Constitutional: Negative.   HENT: Negative.   Eyes: Negative.   Respiratory: Negative.   Cardiovascular: Negative.   Gastrointestinal: Negative.   Endocrine: Negative.   Genitourinary: Negative.   Musculoskeletal: Positive for gait problem.  Skin: Negative.   Allergic/Immunologic: Negative.   Neurological: Positive for numbness.  Hematological: Negative.   Psychiatric/Behavioral: Negative.        Objective:   Physical Exam Constitutional: No distress.   Respiratory: Normal effort.   Psych: Normal mood.  Normal behavior. 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  1. 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  Cont HEP, completed therapies             Continue follow up Neurology  2. Essential hypertension:              Elevated today, however, WNL per patient             Continue meds  3. Gait abnormality  Cont HEP  No longer using assistive device  Education on  environmental safety

## 2019-11-21 ENCOUNTER — Other Ambulatory Visit (HOSPITAL_COMMUNITY): Payer: Self-pay | Admitting: Interventional Radiology

## 2019-11-21 ENCOUNTER — Telehealth: Payer: Self-pay

## 2019-11-21 ENCOUNTER — Ambulatory Visit (INDEPENDENT_AMBULATORY_CARE_PROVIDER_SITE_OTHER): Payer: PPO | Admitting: Family Medicine

## 2019-11-21 ENCOUNTER — Other Ambulatory Visit: Payer: Self-pay

## 2019-11-21 ENCOUNTER — Telehealth (HOSPITAL_COMMUNITY): Payer: Self-pay

## 2019-11-21 ENCOUNTER — Encounter: Payer: Self-pay | Admitting: Family Medicine

## 2019-11-21 VITALS — BP 140/70 | HR 74 | Temp 97.6°F | Ht 64.0 in | Wt 148.2 lb

## 2019-11-21 DIAGNOSIS — I10 Essential (primary) hypertension: Secondary | ICD-10-CM

## 2019-11-21 DIAGNOSIS — E039 Hypothyroidism, unspecified: Secondary | ICD-10-CM | POA: Diagnosis not present

## 2019-11-21 DIAGNOSIS — E7800 Pure hypercholesterolemia, unspecified: Secondary | ICD-10-CM

## 2019-11-21 DIAGNOSIS — E876 Hypokalemia: Secondary | ICD-10-CM

## 2019-11-21 DIAGNOSIS — I69391 Dysphagia following cerebral infarction: Secondary | ICD-10-CM

## 2019-11-21 DIAGNOSIS — I639 Cerebral infarction, unspecified: Secondary | ICD-10-CM

## 2019-11-21 DIAGNOSIS — I6350 Cerebral infarction due to unspecified occlusion or stenosis of unspecified cerebral artery: Secondary | ICD-10-CM | POA: Diagnosis not present

## 2019-11-21 DIAGNOSIS — E1165 Type 2 diabetes mellitus with hyperglycemia: Secondary | ICD-10-CM | POA: Diagnosis not present

## 2019-11-21 DIAGNOSIS — G8191 Hemiplegia, unspecified affecting right dominant side: Secondary | ICD-10-CM | POA: Diagnosis not present

## 2019-11-21 DIAGNOSIS — N182 Chronic kidney disease, stage 2 (mild): Secondary | ICD-10-CM

## 2019-11-21 DIAGNOSIS — I6322 Cerebral infarction due to unspecified occlusion or stenosis of basilar arteries: Secondary | ICD-10-CM

## 2019-11-21 LAB — URINALYSIS, ROUTINE W REFLEX MICROSCOPIC
Bilirubin Urine: NEGATIVE
Hgb urine dipstick: NEGATIVE
Ketones, ur: NEGATIVE
Nitrite: NEGATIVE
RBC / HPF: NONE SEEN (ref 0–?)
Specific Gravity, Urine: 1.015 (ref 1.000–1.030)
Total Protein, Urine: NEGATIVE
Urine Glucose: NEGATIVE
Urobilinogen, UA: 0.2 (ref 0.0–1.0)
pH: 7.5 (ref 5.0–8.0)

## 2019-11-21 LAB — COMPREHENSIVE METABOLIC PANEL
ALT: 13 U/L (ref 0–35)
AST: 13 U/L (ref 0–37)
Albumin: 4.5 g/dL (ref 3.5–5.2)
Alkaline Phosphatase: 46 U/L (ref 39–117)
BUN: 10 mg/dL (ref 6–23)
CO2: 33 mEq/L — ABNORMAL HIGH (ref 19–32)
Calcium: 10 mg/dL (ref 8.4–10.5)
Chloride: 94 mEq/L — ABNORMAL LOW (ref 96–112)
Creatinine, Ser: 0.66 mg/dL (ref 0.40–1.20)
GFR: 88.72 mL/min (ref >60.00)
Glucose, Bld: 119 mg/dL — ABNORMAL HIGH (ref 70–99)
Potassium: 2.8 mEq/L — CL (ref 3.5–5.1)
Sodium: 139 mEq/L (ref 135–145)
Total Bilirubin: 0.7 mg/dL (ref 0.2–1.2)
Total Protein: 6.9 g/dL (ref 6.0–8.3)

## 2019-11-21 LAB — LIPID PANEL
Cholesterol: 143 mg/dL (ref 0–200)
HDL: 44.3 mg/dL (ref >39.00)
LDL Cholesterol: 71 mg/dL (ref 0–99)
NonHDL: 98.38
Total CHOL/HDL Ratio: 3
Triglycerides: 136 mg/dL (ref 0.0–149.0)
VLDL: 27.2 mg/dL (ref 0.0–40.0)

## 2019-11-21 LAB — MICROALBUMIN / CREATININE URINE RATIO
Creatinine,U: 73.1 mg/dL
Microalb Creat Ratio: 1 mg/g (ref 0.0–30.0)
Microalb, Ur: <0.7 mg/dL (ref 0.0–1.9)

## 2019-11-21 LAB — CBC
HCT: 39.3 % (ref 36.0–46.0)
Hemoglobin: 13.8 g/dL (ref 12.0–15.0)
MCHC: 35.1 g/dL (ref 30.0–36.0)
MCV: 84.1 fl (ref 78.0–100.0)
Platelets: 330 10*3/uL (ref 150.0–400.0)
RBC: 4.68 Mil/uL (ref 3.87–5.11)
RDW: 13.5 % (ref 11.5–15.5)
WBC: 6.8 10*3/uL (ref 4.0–10.5)

## 2019-11-21 LAB — TSH: TSH: 1.43 u[IU]/mL (ref 0.35–4.50)

## 2019-11-21 LAB — LDL CHOLESTEROL, DIRECT: Direct LDL: 70 mg/dL

## 2019-11-21 LAB — HEMOGLOBIN A1C: Hgb A1c MFr Bld: 5.8 % (ref 4.6–6.5)

## 2019-11-21 MED ORDER — CLONIDINE HCL 0.1 MG PO TABS: 0.1000 mg | ORAL_TABLET | Freq: Two times a day (BID) | ORAL | 3 refills | Status: DC

## 2019-11-21 MED ORDER — POTASSIUM CHLORIDE CRYS ER 20 MEQ PO TBCR: 40.0000 meq | EXTENDED_RELEASE_TABLET | Freq: Every day | ORAL | 0 refills | Status: DC

## 2019-11-21 MED ORDER — POTASSIUM CHLORIDE CRYS ER 20 MEQ PO TBCR: 20.0000 meq | EXTENDED_RELEASE_TABLET | Freq: Every day | ORAL | 1 refills | Status: DC

## 2019-11-21 NOTE — Telephone Encounter (Signed)
Patient aware and will pick up supplements today

## 2019-11-21 NOTE — Telephone Encounter (Signed)
Called to schedule cta head/neck, no answer, left vm. AW 

## 2019-11-21 NOTE — Addendum Note (Signed)
Addended by: Jon Billings on: 11/21/2019 05:57 PM   Modules accepted: Orders

## 2019-11-21 NOTE — Telephone Encounter (Signed)
Potassium supplement sent x 5days

## 2019-11-21 NOTE — Telephone Encounter (Signed)
Patient of Dr. Bebe Shaggy seen in the office today for follow up visit labs ordered. Critical call came in from lab with critical Potassium level of 2.8. Please advise

## 2019-11-21 NOTE — Progress Notes (Addendum)
Established Patient Office Visit  Subjective:  Patient ID: Megan Martinez, female    DOB: 1950/08/22  Age: 69 y.o. MRN: [id number]  CC:  Chief Complaint  Patient presents with  . Follow-up    2 month follow up, patient would like note to receive unemployment.      HPI Megan Martinez presents for follow-up of her blood pressure, diabetes, elevated cholesterol and right-sided hemiparesis status post left pontine angle cerebellar CVA.  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  Past Medical History:  Diagnosis Date  . Allergy   . Arthritis   . GERD (gastroesophageal reflux disease)   . Hypertension   . Hypothyroidism   . Pneumonia   . Stroke [facility])    right arm is "not working", speech a little slurred  . Thyroid disease     Past Surgical History:  Procedure Laterality Date  . ABDOMINAL HYSTERECTOMY    . APPENDECTOMY    . BREAST SURGERY Right    lumpectomy  . CATARACT EXTRACTION    . CHOLECYSTECTOMY    . IR ANGIO INTRA EXTRACRAN SEL COM CAROTID INNOMINATE BILAT MOD SED  07/24/2019  . IR ANGIO VERTEBRAL SEL SUBCLAVIAN INNOMINATE UNI L MOD SED  07/24/2019  . IR INTRA CRAN STENT  07/24/2019  . RADIOLOGY WITH ANESTHESIA N/A 07/09/2019   Procedure: RADIOLOGY WITH ANESTHESIA STENT PLACEMENT;  Surgeon: Luanne Bras, MD;  Location: [place];  Service: Radiology;  Laterality: N/A;  . RADIOLOGY WITH ANESTHESIA N/A 07/23/2019   Procedure: RADIOLOGY WITH ANESTHESIA STENT PLACEMENT;  Surgeon: Luanne Bras, MD;  Location[place];  Service: Radiology;  Laterality: N/A;  . RADIOLOGY WITH ANESTHESIA N/A 07/24/2019   Procedure: RADIOLOGY WITH ANESTHESIA STENT PLACEMENT;  Surgeon: Luanne Bras, MD;  Location: [place];  Service: Radiology;  Laterality: N/A;  . TUBAL LIGATION      History reviewed. No pertinent family history.  Social History   Socioeconomic History  . Marital status: Married    Spouse name: Not on file  . Number of children: Not on file  . Years of  education: Not on file  . Highest education level: Not on file  Occupational History  . Not on file  Tobacco Use  . Smoking status: Former Smoker    Quit date: 1970    Years since quitting: 51.5  . Smokeless tobacco: Never Used  Vaping Use  . Vaping Use: Never used  Substance and Sexual Activity  . Alcohol use: Yes    Comment: occasional  . Drug use: Never  . Sexual activity: Not on file  Other Topics Concern  . Not on file  Social History Narrative  . Not on file   Social Determinants of Health   Financial Resource Strain:   . Difficulty of Paying Living Expenses:   Food Insecurity:   . Worried About Charity fundraiser in the Last Year:   . Arboriculturist in the Last Year:   Transportation Needs:   . Film/video editor (Medical):   Marland Kitchen Lack of Transportation (Non-Medical):   Physical Activity:   . Days of Exercise per Week:   . Minutes of Exercise per Session:   Stress:   . Feeling of Stress :   Social Connections:   . Frequency of Communication with Friends and Family:   . Frequency of Social Gatherings with Friends and Family:   . Attends Religious Services:   . Active Member of Clubs  or Organizations:   . Attends Archivist Meetings:   Marland Kitchen Marital Status:   Intimate Partner Violence:   . Fear of Current or Ex-Partner:   . Emotionally Abused:   Marland Kitchen Physically Abused:   . Sexually Abused:     Outpatient Medications Prior to Visit  Medication Sig Dispense Refill  . acetaminophen (TYLENOL) 325 MG tablet Take 2 tablets (650 mg total) by mouth every 4 (four) hours as needed for mild pain (or temp > 37.5 C (99.5 F)).    Marland Kitchen amLODipine (NORVASC) 10 MG tablet Take 1 tablet (10 mg total) by mouth daily. 90 tablet 1  . aspirin EC 325 MG EC tablet Take 1 tablet (325 mg total) by mouth daily. 30 tablet 0  . atorvastatin (LIPITOR) 80 MG tablet Take 1 tablet (80 mg total) by mouth daily at 6 PM. 90 tablet 1  . chlorthalidone (HYGROTON) 25 MG tablet Take 1 tablet  (25 mg total) by mouth daily. 90 tablet 1  . clopidogrel (PLAVIX) 75 MG tablet Take 1 tablet (75 mg total) by mouth daily. 30 tablet 1  . levothyroxine (SYNTHROID) 137 MCG tablet TAKE 1 TABLET(137 MCG) BY MOUTH DAILY AT 6 AM 90 tablet 0  . fluticasone (FLONASE) 50 MCG/ACT nasal spray Place 2 sprays into both nostrils daily. (Patient not taking: Reported on 11/19/2019) 16 g 6  . levocetirizine (XYZAL) 5 MG tablet TAKE 1 TABLET(5 MG) BY MOUTH EVERY EVENING (Patient not taking: Reported on 11/19/2019) 90 tablet 3  . pantoprazole (PROTONIX) 40 MG tablet TAKE 1 TABLET(40 MG) BY MOUTH AT BEDTIME (Patient not taking: Reported on 11/19/2019) 90 tablet 0   Facility-Administered Medications Prior to Visit  Medication Dose Route Frequency Provider Last Rate Last Admin  . midazolam (VERSED) 5 MG/5ML injection   Intravenous Anesthesia Intra-op Mariea Clonts, CRNA   1 mg at 07/09/19 0845    Allergies  Allergen Reactions  . Latex Itching and Rash  . Statins Other (See Comments)    Myalgias, "sick"   . Amoxicillin Rash    Did it involve swelling of the face/tongue/throat, SOB, or low BP? No Did it involve sudden or severe rash/hives, skin peeling, or any reaction on the inside of your mouth or nose? Yes Did you need to seek medical attention at a hospital or doctor's office? N/A When did it last happen? Child If all above answers are "NO", may proceed with cephalosporin use.  . Codeine Nausea Only  . Penicillin G Rash    Did it involve swelling of the face/tongue/throat, SOB, or low BP? No Did it involve sudden or severe rash/hives, skin peeling, or any reaction on the inside of your mouth or nose? Yes Did you need to seek medical attention at a hospital or doctor's office? N/A When did it last happen?Child If all above answers are "NO", may proceed with cephalosporin use.    ROS Review of Systems  Constitutional: Negative.   Respiratory: Negative.   Cardiovascular: Negative.     Gastrointestinal: Negative.   Endocrine: Negative for polyphagia and polyuria.  Musculoskeletal: Negative for joint swelling and myalgias.  Neurological: Positive for weakness and numbness. Negative for headaches.  Psychiatric/Behavioral: Negative.       Objective:    Physical Exam Vitals and nursing note reviewed.  Constitutional:      General: She is not in acute distress.    Appearance: Normal appearance. She is not ill-appearing, toxic-appearing or diaphoretic.  HENT:     Head: Normocephalic and  atraumatic.     Right Ear: External ear normal.     Left Ear: External ear normal.     Mouth/Throat:     Mouth: Mucous membranes are moist.     Pharynx: Oropharynx is clear. No oropharyngeal exudate or posterior oropharyngeal erythema.  Eyes:     General: No scleral icterus.       Right eye: No discharge.        Left eye: No discharge.     Extraocular Movements: Extraocular movements intact.     Conjunctiva/sclera: Conjunctivae normal.  Cardiovascular:     Rate and Rhythm: Normal rate and regular rhythm.  Pulmonary:     Effort: Pulmonary effort is normal.     Breath sounds: Normal breath sounds.  Skin:    General: Skin is warm and dry.  Neurological:     Mental Status: She is alert and oriented to person, place, and time.  Psychiatric:        Mood and Affect: Mood normal.        Behavior: Behavior normal.     BP 140/70   Pulse 74   Temp 97.6 F (36.4 C) (Tympanic)   Ht 5\' 4"  (1.626 m)   Wt 148 lb 3.2 oz (67.2 kg)   SpO2 97%   BMI 25.44 kg/m  Wt Readings from Last 3 Encounters:  11/21/19 148 lb 3.2 oz (67.2 kg)  11/19/19 147 lb 12.8 oz (67 kg)  09/21/19 150 lb 9.6 oz (68.3 kg)     Health Maintenance Due  Topic Date Due  . Hepatitis C Screening  Never done  . FOOT EXAM  Never done  . OPHTHALMOLOGY EXAM  Never done  . COVID-19 Vaccine (1) Never done  . TETANUS/TDAP  Never done  . MAMMOGRAM  Never done  . COLONOSCOPY  Never done  . DEXA SCAN  Never done     There are no preventive care reminders to display for this patient.  Lab Results  Component Value Date   TSH 1.43 11/21/2019   Lab Results  Component Value Date   WBC 6.8 11/21/2019   HGB 13.8 11/21/2019   HCT 39.3 11/21/2019   MCV 84.1 11/21/2019   PLT 330.0 11/21/2019   Lab Results  Component Value Date   NA 139 11/21/2019   K 2.8 (LL) 11/21/2019   CO2 33 (H) 11/21/2019   GLUCOSE 119 (H) 11/21/2019   BUN 10 11/21/2019   CREATININE 0.66 11/21/2019   BILITOT 0.7 11/21/2019   ALKPHOS 46 11/21/2019   AST 13 11/21/2019   ALT 13 11/21/2019   PROT 6.9 11/21/2019   ALBUMIN 4.5 11/21/2019   CALCIUM 10.0 11/21/2019   ANIONGAP 9 07/25/2019   GFR 88.72 11/21/2019   Lab Results  Component Value Date   CHOL 143 11/21/2019   Lab Results  Component Value Date   HDL 44.30 11/21/2019   Lab Results  Component Value Date   LDLCALC 71 11/21/2019   Lab Results  Component Value Date   TRIG 136.0 11/21/2019   Lab Results  Component Value Date   CHOLHDL 3 11/21/2019   Lab Results  Component Value Date   HGBA1C 5.8 11/21/2019      Assessment & Plan:   Problem List Items Addressed This Visit      Cardiovascular and Mediastinum   Essential hypertension - Primary   Relevant Medications   cloNIDine (CATAPRES) 0.1 MG tablet   Other Relevant Orders   CBC (Completed)   Comprehensive metabolic panel (  Completed)   Urinalysis, Routine w reflex microscopic (Completed)   Microalbumin / creatinine urine ratio (Completed)   Left pontine cerebrovascular accident ([place])   Relevant Medications   cloNIDine (CATAPRES) 0.1 MG tablet     Digestive   Dysphagia, post-stroke     Endocrine   Acquired hypothyroidism   Relevant Orders   TSH (Completed)   Type 2 diabetes mellitus with hyperglycemia, without long-term current use of insulin (HCC)   Relevant Orders   Comprehensive metabolic panel (Completed)   Hemoglobin A1c (Completed)   Microalbumin / creatinine urine ratio  (Completed)     Nervous and Auditory   Right hemiparesis (HCC)     Genitourinary   Stage 2 chronic kidney disease   Relevant Orders   Comprehensive metabolic panel (Completed)     Other   Hypokalemia   Relevant Medications   potassium chloride SA (KLOR-CON) 20 MEQ tablet   Other Relevant Orders   Comprehensive metabolic panel (Completed)   Potassium   Elevated LDL cholesterol level   Relevant Orders   LDL cholesterol, direct (Completed)   Lipid panel (Completed)      Meds ordered this encounter  Medications  . cloNIDine (CATAPRES) 0.1 MG tablet    Sig: Take 1 tablet (0.1 mg total) by mouth 2 (two) times daily.    Dispense:  60 tablet    Refill:  3  . potassium chloride SA (KLOR-CON) 20 MEQ tablet    Sig: Take 1 tablet (20 mEq total) by mouth daily. Start after completion of prescription for 40 mEq daily    Dispense:  30 tablet    Refill:  1    Follow-up: Return in about 5 weeks (around 12/26/2019).   Have added clonidine 0.1 twice daily.  Brief note given to patient about being unable to work with right-sided hemiparesis.  She will need to return to her rehabilitation specialist if more detail is required.  She understands that. Libby Maw, MD

## 2019-11-26 ENCOUNTER — Other Ambulatory Visit: Payer: Self-pay | Admitting: Family Medicine

## 2019-11-26 DIAGNOSIS — E876 Hypokalemia: Secondary | ICD-10-CM

## 2019-11-27 ENCOUNTER — Ambulatory Visit: Payer: PPO | Admitting: Adult Health

## 2019-12-05 ENCOUNTER — Ambulatory Visit (HOSPITAL_COMMUNITY): Admission: RE | Admit: 2019-12-05 | Payer: PPO | Source: Ambulatory Visit

## 2019-12-05 ENCOUNTER — Encounter (HOSPITAL_COMMUNITY): Payer: Self-pay

## 2019-12-08 DIAGNOSIS — I69351 Hemiplegia and hemiparesis following cerebral infarction affecting right dominant side: Secondary | ICD-10-CM | POA: Diagnosis not present

## 2019-12-10 ENCOUNTER — Ambulatory Visit: Payer: PPO | Admitting: Adult Health

## 2019-12-10 ENCOUNTER — Telehealth: Payer: Self-pay | Admitting: Adult Health

## 2019-12-10 NOTE — Progress Notes (Deleted)
[facility] [address] [place]. [place] [address] [phone]       STROKE FOLLOW UP NOTE  [name] Date of Birth:  10-23-1950 Medical Record Number:  [id number]   Reason for Referral: stroke follow up    CHIEF COMPLAINT:  No chief complaint on file.   HPI:  Today, 12/10/2019, [name] returns for stroke follow-up.  Residual deficits of right hemiparesis and dysarthria with weakness improving and since completed PT.  Dysarthria ***.  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  Remains on aspirin 325 mg daily and clopidogrel without bleeding or bruising. Has not had repeat imaging for BA stenosis s/p stent as recommended by IR.  Remains on atorvastatin without myalgias.  Recent lipid panel 11/21/2019 showed LDL 71.  Blood pressure today ***.  No further concerns at this time.     History provided for reference purposes only Initial visit 08/28/2019 JM: [name] is being seen today for hospital follow-up accompanied by her daughter.  She has been doing well since discharge with residual right hemiparesis, right facial droop and dysarthria.  She recently completed therapy and is interested in pursuing outpatient therapy.  Ambulatory with rolling walker but no AD in her home.  She does endorse right shoulder pain greater in deltoid and tricep but is hopeful ongoing improvement once initiating outpatient therapy.  She did undergo revascularization for BA stenosis using stent assisted angioplasty on 07/24/2019 by Dr. Estanislado Pandy without complication.  Plans on repeating CTA head/neck 4 months post procedure.  Continues on aspirin 325 mg daily and clopidogrel 75 mg daily without bleeding or bruising.  Continues on atorvastatin without myalgias.  Blood pressure today 142/86.  No further concerns at this time.  Stroke admission 06/14/2019: Ms.Megan A Lawsonis a 69 y.o.femalewith history of HTN and thyroid disease who presented on  06/14/2019 with R sided weakness and dysarthria.  Evaluated by stroke team with stroke work-up revealing left pontine infarct s/p tPA (per Wake-up trial guidelines) in setting of BA stenosis, infarct secondary to large vessel disease.  Evaluated by IR Dr. Estanislado Pandy with plans on intervention on BA stent post rehab stay.  Recommended DAPT for 3 months then Plavix alone as previously on aspirin and due to intracranial stenosis.  HTN stable and recommended long-term BP goal 130-160 prior to BA intervention and avoidance of low blood pressure.  LDL 137 and initiated atorvastatin 80 mg daily.  No history of DM with A1c 5.8.  Other stroke risk factors include former tobacco use, and EtOH use but no prior history of stroke.  Residual deficits of mild dysarthria, mild facial droop, and right hemiparesis and discharged to CIR on 06/20/2019 for ongoing therapy needs.  She was discharged home on 69/03/2020.  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 ASPECTS 10.    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 Moderate B proximal PCA stenoses. L VA 50% stenosis w/ narrowing distal  V4  CT perfusion no infarct   MRI  L paramedian pontine infarct. High-grade mid BA stenosis.   CT at 24h low density L ponts. No hemorrhage   2D Echo EF 60-65%. No source of embolus    LDL 137  HgbA1c 5.8  lovenox for VTE prophylaxis  aspirin 81 mg daily prior to admission, now on aspirin  325 mg daily, clopidogrel 75 mg daily following aspirin and Plavix load. Continue DAPT x 3 months then plavix alone for intracranial stenosis.  Therapy recommendations: CIR  Disposition:  CIR         ROS:   14 system review of systems performed and negative with exception of weakness, gait impairment, pain and dysarthria  PMH:  Past Medical History:  Diagnosis Date  . Allergy   . Arthritis    . GERD (gastroesophageal reflux disease)   . Hypertension   . Hypothyroidism   . Pneumonia   . Stroke [facility])    right arm is "not working", speech a little slurred  . Thyroid disease     PSH:  Past Surgical History:  Procedure Laterality Date  . ABDOMINAL HYSTERECTOMY    . APPENDECTOMY    . BREAST SURGERY Right    lumpectomy  . CATARACT EXTRACTION    . CHOLECYSTECTOMY    . IR ANGIO INTRA EXTRACRAN SEL COM CAROTID INNOMINATE BILAT MOD SED  07/24/2019  . IR ANGIO VERTEBRAL SEL SUBCLAVIAN INNOMINATE UNI L MOD SED  07/24/2019  . IR INTRA CRAN STENT  07/24/2019  . RADIOLOGY WITH ANESTHESIA N/A 07/09/2019   Procedure: RADIOLOGY WITH ANESTHESIA STENT PLACEMENT;  Surgeon: Luanne Bras, MD;  Location: [place];  Service: Radiology;  Laterality: N/A;  . RADIOLOGY WITH ANESTHESIA N/A 07/23/2019   Procedure: RADIOLOGY WITH ANESTHESIA STENT PLACEMENT;  Surgeon: Luanne Bras, MD;  Location: [place];  Service: Radiology;  Laterality: N/A;  . RADIOLOGY WITH ANESTHESIA N/A 07/24/2019   Procedure: RADIOLOGY WITH ANESTHESIA STENT PLACEMENT;  Surgeon: Luanne Bras, MD;  Location: [place];  Service: Radiology;  Laterality: N/A;  . TUBAL LIGATION      Social History:  Social History   Socioeconomic History  . Marital status: Married    Spouse name: Not on file  . Number of children: Not on file  . Years of education: Not on file  . Highest education level: Not on file  Occupational History  . Not on file  Tobacco Use  . Smoking status: Former Smoker    Quit date: 1970    Years since quitting: 51.5  . Smokeless tobacco: Never Used  Vaping Use  . Vaping Use: Never used  Substance and Sexual Activity  . Alcohol use: Yes    Comment: occasional  . Drug use: Never  . Sexual activity: Not on file  Other Topics Concern  . Not on file  Social History Narrative  . Not on file   Social Determinants of Health   Financial Resource Strain:   . Difficulty of Paying Living Expenses:   Food  Insecurity:   . Worried About Charity fundraiser in the Last Year:   . Arboriculturist in the Last Year:   Transportation Needs:   . Film/video editor (Medical):   Marland Kitchen Lack of Transportation (Non-Medical):   Physical Activity:   . Days of Exercise per Week:   . Minutes of Exercise per Session:   Stress:   . Feeling of Stress :   Social Connections:   . Frequency of Communication with Friends and Family:   . Frequency of Social Gatherings with Friends and Family:   . Attends Religious Services:   . Active Member of Clubs or Organizations:   . Attends Archivist Meetings:   Marland Kitchen Marital Status:   Intimate Partner Violence:   . Fear of Current or Ex-Partner:   . Emotionally Abused:   .  Physically Abused:   . Sexually Abused:     Family History: No family history on file.  Medications:   Current Outpatient Medications on File Prior to Visit  Medication Sig Dispense Refill  . acetaminophen (TYLENOL) 325 MG tablet Take 2 tablets (650 mg total) by mouth every 4 (four) hours as needed for mild pain (or temp > 37.5 C (99.5 F)).    Marland Kitchen amLODipine (NORVASC) 10 MG tablet Take 1 tablet (10 mg total) by mouth daily. 90 tablet 1  . aspirin EC 325 MG EC tablet Take 1 tablet (325 mg total) by mouth daily. 30 tablet 0  . atorvastatin (LIPITOR) 80 MG tablet Take 1 tablet (80 mg total) by mouth daily at 6 PM. 90 tablet 1  . chlorthalidone (HYGROTON) 25 MG tablet Take 1 tablet (25 mg total) by mouth daily. 90 tablet 1  . cloNIDine (CATAPRES) 0.1 MG tablet Take 1 tablet (0.1 mg total) by mouth 2 (two) times daily. 60 tablet 3  . clopidogrel (PLAVIX) 75 MG tablet Take 1 tablet (75 mg total) by mouth daily. 30 tablet 1  . fluticasone (FLONASE) 50 MCG/ACT nasal spray Place 2 sprays into both nostrils daily. (Patient not taking: Reported on 11/19/2019) 16 g 6  . levocetirizine (XYZAL) 5 MG tablet TAKE 1 TABLET(5 MG) BY MOUTH EVERY EVENING (Patient not taking: Reported on 11/19/2019) 90 tablet 3    . levothyroxine (SYNTHROID) 137 MCG tablet TAKE 1 TABLET(137 MCG) BY MOUTH DAILY AT 6 AM 90 tablet 0  . pantoprazole (PROTONIX) 40 MG tablet TAKE 1 TABLET(40 MG) BY MOUTH AT BEDTIME (Patient not taking: Reported on 11/19/2019) 90 tablet 0  . potassium chloride SA (KLOR-CON) 20 MEQ tablet Take 2 tablets (40 mEq total) by mouth daily. 10 tablet 0  . potassium chloride SA (KLOR-CON) 20 MEQ tablet TAKE 1 TABLET(20 MEQ) BY MOUTH DAILY. START AFTER COMPLETION OF PRESCRIPTION FOR 40 MEQ DAILY 90 tablet 1   Current Facility-Administered Medications on File Prior to Visit  Medication Dose Route Frequency Provider Last Rate Last Admin  . midazolam (VERSED) 5 MG/5ML injection   Intravenous Anesthesia Intra-op Mariea Clonts, CRNA   1 mg at 07/09/19 0845    Allergies:   Allergies  Allergen Reactions  . Latex Itching and Rash  . Statins Other (See Comments)    Myalgias, "sick"   . Amoxicillin Rash    Did it involve swelling of the face/tongue/throat, SOB, or low BP? No Did it involve sudden or severe rash/hives, skin peeling, or any reaction on the inside of your mouth or nose? Yes Did you need to seek medical attention at a hospital or doctor's office? N/A When did it last happen? Child If all above answers are "NO", may proceed with cephalosporin use.  . Codeine Nausea Only  . Penicillin G Rash    Did it involve swelling of the face/tongue/throat, SOB, or low BP? No Did it involve sudden or severe rash/hives, skin peeling, or any reaction on the inside of your mouth or nose? Yes Did you need to seek medical attention at a hospital or doctor's office? N/A When did it last happen?Child If all above answers are "NO", may proceed with cephalosporin use.     Physical Exam  There were no vitals filed for this visit. There is no height or weight on file to calculate BMI. No exam data present  General: well developed, well nourished, pleasant middle aged Caucasian female, seated, in no  evident distress Head: head normocephalic  and atraumatic.   Neck: supple with no carotid or supraclavicular bruits Cardiovascular: regular rate and rhythm, no murmurs Musculoskeletal: no deformity Skin:  no rash/petichiae Vascular:  Normal pulses all extremities   Neurologic Exam Mental Status: Awake and fully alert.   Mild dysarthria.  Oriented to place and time. Recent and remote memory intact. Attention span, concentration and fund of knowledge appropriate. Mood and affect appropriate.  Cranial Nerves: Pupils equal, briskly reactive to light. Extraocular movements full without nystagmus. Visual fields full to confrontation. Hearing intact. Facial sensation intact.  Right lower facial weakness.  Tongue, and palate moves normally and symmetrically.  Motor: Full strength left upper and lower extremity RUE: 4/5 deltoid with increased tone (full ROM with passive movement is slightly increased pain/tightness), 3/5 elbow extension and flexion, weak grip strength with increased tone RLE: 4/5 hip flexor, knee extension and flexion; foot drop currently in brace Sensory.: intact to touch , pinprick , position and vibratory sensation.  Coordination: Rapid alternating movements normal in all extremities except diminished right hand. Finger-to-nose and heel-to-shin performed accurately on left side Gait and Station: Arises from chair without difficulty. Stance is normal.  Mild hemiplegic right-sided gait with use of rolling walker; no balance impairment identified Reflexes: 1+ and symmetric. Toes downgoing.        ASSESSMENT: Megan Martinez is a 69 y.o. 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  On 07/24/2019, s/p revascularization of basilar artery stenosis using stent assisted angioplasty by Dr. Estanislado Pandy patient.  Vascular risk factors include  HTN, HLD, BA stenosis, former tobacco use and EtOH use.      PLAN:  1. Left pontine stroke:  -Residual deficits: Right hemiparesis, right facial droop and dysarthria.  -Continue aspirin 325 mg daily and clopidogrel 75 mg daily  and atorvastatin 80 mg daily for secondary stroke prevention.  Advised ongoing DAPT not indicated from a stroke standpoint recommended to continue from IR. -Close PCP follow-up for aggressive stroke risk factor management 2. BA stenosis s/p stent placement:  -Advised to contact Dr. Arlean Hopping office to schedule CTA head/neck for follow-up imaging.  -Continuation of DAPT and statin with ongoing DAPT duration determined by IR (see #1) 3. HTN: BP goal <130/90.  Stable.  Ongoing monitoring management by PCP 4. HLD: LDL goal <70.  Recent 71.  Continuation of atorvastatin 80 mg daily and ongoing prescribing, monitoring management by PCP     Follow up in 3 months or call earlier if needed   I spent 50 minutes of face-to-face and non-face-to-face time with patient and daughter.  This included previsit chart review, lab review, study review, order entry, electronic health record documentation, patient education regarding recent stroke, residual deficits, BA stenosis s/p stent placement, importance of ongoing management of secondary stroke risk factors and answered all questions to patient and daughter satisfaction    Frann Rider, AGNP-BC  [facility] [address] [place], [place] [address]  Phone [phone] Fax [phone] Note: This document was prepared with digital dictation and possible smart phrase technology. Any transcriptional errors that result from this process are unintentional.

## 2019-12-10 NOTE — Telephone Encounter (Signed)
Noted. appt cancelled

## 2019-12-10 NOTE — Telephone Encounter (Signed)
Pt called stating that she had her Covid vaccination and is now having a fever.

## 2019-12-12 ENCOUNTER — Telehealth: Payer: Self-pay | Admitting: Family Medicine

## 2019-12-12 NOTE — Telephone Encounter (Signed)
Left message for patient to schedule Annual Wellness Visit.  Please schedule with Nurse Health Advisor Martha Stanley, RN at [facility]  °

## 2019-12-17 ENCOUNTER — Telehealth: Payer: Self-pay | Admitting: Family Medicine

## 2019-12-17 NOTE — Telephone Encounter (Signed)
Left message for patient to schedule Annual Wellness Visit.  Please schedule with Nurse Health Advisor Martha Stanley, RN at [facility]  °

## 2019-12-20 ENCOUNTER — Other Ambulatory Visit: Payer: Self-pay

## 2019-12-21 ENCOUNTER — Ambulatory Visit (INDEPENDENT_AMBULATORY_CARE_PROVIDER_SITE_OTHER): Payer: PPO | Admitting: Nurse Practitioner

## 2019-12-21 ENCOUNTER — Encounter: Payer: Self-pay | Admitting: Nurse Practitioner

## 2019-12-21 VITALS — BP 136/74 | HR 57 | Temp 97.4°F | Ht 64.0 in | Wt 150.4 lb

## 2019-12-21 DIAGNOSIS — M5441 Lumbago with sciatica, right side: Secondary | ICD-10-CM

## 2019-12-21 LAB — POTASSIUM: Potassium: 3.2 mEq/L — ABNORMAL LOW (ref 3.5–5.1)

## 2019-12-21 MED ORDER — METHYLPREDNISOLONE ACETATE 40 MG/ML IJ SUSP: 40.0000 mg | Freq: Once | INTRAMUSCULAR | Status: DC

## 2019-12-21 MED ORDER — TRAMADOL-ACETAMINOPHEN 37.5-325 MG PO TABS: 0.5000 | ORAL_TABLET | Freq: Three times a day (TID) | ORAL | 0 refills | Status: AC | PRN

## 2019-12-21 MED ORDER — METHYLPREDNISOLONE ACETATE 40 MG/ML IJ SUSP
40.0000 mg | Freq: Once | INTRAMUSCULAR | Status: AC
Start: 2019-12-21 — End: 2019-12-21
  Administered 2019-12-21: 40 mg via INTRAMUSCULAR

## 2019-12-21 NOTE — Progress Notes (Signed)
Subjective:  Patient ID: Megan Martinez, female    DOB: [birth year]  Age: 69 y.o. MRN: [id number]  CC: Pain (Left hip pain x 3 weeks nothing seems to help. )  Hip Pain  The incident occurred more than 1 week ago. The incident occurred at home. There was no injury mechanism. The pain is present in the left hip. The quality of the pain is described as aching. The pain is moderate. The pain has been intermittent since onset. Pertinent negatives include no inability to bear weight, loss of motion, loss of sensation, muscle weakness, numbness or tingling. She reports no foreign bodies present. The symptoms are aggravated by movement and palpation (sitting). She has tried acetaminophen for the symptoms. The treatment provided mild relief.  use of plavix due to CVA Denies any recent fall  Reviewed past Medical, Social and Family history today.  Outpatient Medications Prior to Visit  Medication Sig Dispense Refill  . acetaminophen (TYLENOL) 325 MG tablet Take 2 tablets (650 mg total) by mouth every 4 (four) hours as needed for mild pain (or temp > 37.5 C (99.5 F)).    Marland Kitchen amLODipine (NORVASC) 10 MG tablet Take 1 tablet (10 mg total) by mouth daily. 90 tablet 1  . aspirin EC 325 MG EC tablet Take 1 tablet (325 mg total) by mouth daily. 30 tablet 0  . atorvastatin (LIPITOR) 80 MG tablet Take 1 tablet (80 mg total) by mouth daily at 6 PM. 90 tablet 1  . chlorthalidone (HYGROTON) 25 MG tablet Take 1 tablet (25 mg total) by mouth daily. 90 tablet 1  . cloNIDine (CATAPRES) 0.1 MG tablet Take 1 tablet (0.1 mg total) by mouth 2 (two) times daily. 60 tablet 3  . clopidogrel (PLAVIX) 75 MG tablet Take 1 tablet (75 mg total) by mouth daily. 30 tablet 1  . levothyroxine (SYNTHROID) 137 MCG tablet TAKE 1 TABLET(137 MCG) BY MOUTH DAILY AT 6 AM 90 tablet 0  . potassium chloride SA (KLOR-CON) 20 MEQ tablet Take 2 tablets (40 mEq total) by mouth daily. 10 tablet 0  . fluticasone (FLONASE) 50 MCG/ACT nasal spray Place  2 sprays into both nostrils daily. (Patient not taking: Reported on 11/19/2019) 16 g 6  . levocetirizine (XYZAL) 5 MG tablet TAKE 1 TABLET(5 MG) BY MOUTH EVERY EVENING (Patient not taking: Reported on 11/19/2019) 90 tablet 3  . pantoprazole (PROTONIX) 40 MG tablet TAKE 1 TABLET(40 MG) BY MOUTH AT BEDTIME (Patient not taking: Reported on 11/19/2019) 90 tablet 0  . potassium chloride SA (KLOR-CON) 20 MEQ tablet TAKE 1 TABLET(20 MEQ) BY MOUTH DAILY. START AFTER COMPLETION OF PRESCRIPTION FOR 40 MEQ DAILY 90 tablet 1   Facility-Administered Medications Prior to Visit  Medication Dose Route Frequency Provider Last Rate Last Admin  . midazolam (VERSED) 5 MG/5ML injection   Intravenous Anesthesia Intra-op Mariea Clonts, CRNA   1 mg at 07/09/19 0845    ROS See HPI  Objective:  BP (!) 136/74   Pulse 57   Temp (!) 97.4 F (36.3 C) (Tympanic)   Ht 5\' 4"  (1.626 m)   Wt 150 lb 6.4 oz (68.2 kg)   SpO2 97%   BMI 25.82 kg/m   Physical Exam Cardiovascular:     Rate and Rhythm: Normal rate.     Pulses: Normal pulses.  Pulmonary:     Effort: Pulmonary effort is normal.  Musculoskeletal:        General: Tenderness present.     Lumbar back: Tenderness present. Normal  range of motion.     Left hip: Tenderness present. No bony tenderness or crepitus. Normal range of motion. Normal strength.     Right lower leg: No edema.     Left lower leg: Normal. No edema.       Legs:  Skin:    Findings: No erythema or rash.  Neurological:     Mental Status: She is alert and oriented to person, place, and time.     Assessment & Plan:  This visit occurred during the SARS-CoV-2 public health emergency.  Safety protocols were in place, including screening questions prior to the visit, additional usage of staff PPE, and extensive cleaning of exam room while observing appropriate contact time as indicated for disinfecting solutions.   Danyelle was seen today for pain.  Diagnoses and all orders for this  visit:  Acute left-sided low back pain with right-sided sciatica -     Discontinue: methylPREDNISolone acetate (DEPO-MEDROL) injection 40 mg -     traMADol-acetaminophen (ULTRACET) 37.5-325 MG tablet; Take 0.5-1 tablets by mouth every 8 (eight) hours as needed for up to 3 days. -     methylPREDNISolone acetate (DEPO-MEDROL) injection 40 mg  Alternate between warm and cold compress as needed Use tylenol or ultracet for hip and back pain. Call office if not improvement bu Monday.  Problem List Items Addressed This Visit    None    Visit Diagnoses    Acute left-sided low back pain with right-sided sciatica    -  Primary   Relevant Medications   traMADol-acetaminophen (ULTRACET) 37.5-325 MG tablet   methylPREDNISolone acetate (DEPO-MEDROL) injection 40 mg (Completed)      Follow-up: Return if symptoms worsen or fail to improve.  Wilfred Lacy, NP

## 2019-12-21 NOTE — Patient Instructions (Signed)
Alternate between warm and cold compress as needed  Use tylenol or ultracet for hip and back pain.  Call office if not improvement bu Monday.

## 2019-12-22 ENCOUNTER — Other Ambulatory Visit: Payer: Self-pay | Admitting: Family Medicine

## 2019-12-26 ENCOUNTER — Telehealth: Payer: Self-pay | Admitting: Family Medicine

## 2019-12-26 ENCOUNTER — Other Ambulatory Visit: Payer: Self-pay | Admitting: Family Medicine

## 2019-12-26 DIAGNOSIS — E876 Hypokalemia: Secondary | ICD-10-CM

## 2019-12-26 NOTE — Telephone Encounter (Signed)
Should have a rx at pharmacy.Marland Kitchen

## 2019-12-26 NOTE — Telephone Encounter (Signed)
Patient called and stated that she seen [facility] on 7/30 and was told to call back if she still was experiencing symptoms. Patient stated that she is still experiencing pain in her leg and buttocks, please advise. CB is [phone]

## 2019-12-31 ENCOUNTER — Encounter: Payer: Self-pay | Admitting: Family Medicine

## 2019-12-31 ENCOUNTER — Ambulatory Visit (INDEPENDENT_AMBULATORY_CARE_PROVIDER_SITE_OTHER): Payer: PPO

## 2019-12-31 ENCOUNTER — Other Ambulatory Visit: Payer: Self-pay

## 2019-12-31 ENCOUNTER — Ambulatory Visit (INDEPENDENT_AMBULATORY_CARE_PROVIDER_SITE_OTHER): Payer: PPO | Admitting: Family Medicine

## 2019-12-31 VITALS — BP 120/70 | HR 54 | Temp 97.6°F | Ht 64.0 in | Wt 146.4 lb

## 2019-12-31 DIAGNOSIS — E039 Hypothyroidism, unspecified: Secondary | ICD-10-CM | POA: Diagnosis not present

## 2019-12-31 DIAGNOSIS — I10 Essential (primary) hypertension: Secondary | ICD-10-CM

## 2019-12-31 DIAGNOSIS — I6322 Cerebral infarction due to unspecified occlusion or stenosis of basilar arteries: Secondary | ICD-10-CM | POA: Diagnosis not present

## 2019-12-31 DIAGNOSIS — E876 Hypokalemia: Secondary | ICD-10-CM

## 2019-12-31 DIAGNOSIS — R05 Cough: Secondary | ICD-10-CM

## 2019-12-31 DIAGNOSIS — E7800 Pure hypercholesterolemia, unspecified: Secondary | ICD-10-CM

## 2019-12-31 DIAGNOSIS — R059 Cough, unspecified: Secondary | ICD-10-CM

## 2019-12-31 MED ORDER — ATORVASTATIN CALCIUM 80 MG PO TABS: 80.0000 mg | ORAL_TABLET | Freq: Every day | ORAL | 1 refills | Status: DC

## 2019-12-31 MED ORDER — CLONIDINE HCL 0.1 MG PO TABS: 0.1000 mg | ORAL_TABLET | Freq: Two times a day (BID) | ORAL | 3 refills | Status: DC

## 2019-12-31 MED ORDER — LEVOTHYROXINE SODIUM 137 MCG PO TABS: ORAL_TABLET | ORAL | 0 refills | Status: DC

## 2019-12-31 MED ORDER — POTASSIUM CHLORIDE CRYS ER 20 MEQ PO TBCR: EXTENDED_RELEASE_TABLET | ORAL | 1 refills | Status: DC

## 2019-12-31 MED ORDER — AMLODIPINE BESYLATE 10 MG PO TABS: 10.0000 mg | ORAL_TABLET | Freq: Every day | ORAL | 1 refills | Status: DC

## 2019-12-31 MED ORDER — CHLORTHALIDONE 25 MG PO TABS: 25.0000 mg | ORAL_TABLET | Freq: Every day | ORAL | 1 refills | Status: DC

## 2019-12-31 NOTE — Telephone Encounter (Signed)
Left pt a voicemail to call the office is she is still experiencing symptoms.

## 2019-12-31 NOTE — Progress Notes (Addendum)
Established Patient Office Visit  Subjective:  Patient ID: Megan Martinez, female    DOB: 10-05-1950  Age: 69 y.o. MRN: [id number]  CC:  Chief Complaint  Patient presents with  . Follow-up    5 week follow up, concerns about BP being changed to something different do to coughing spells that she has with current medication.     HPI Megan Martinez presents for follow-up of her hypertension, hypokalemia, cough and left hip pain.  Cough is been going on for an extended period of time.  There is no fever or sputum production.  Denies reflux.  Quit smoking 50 years ago.  Denies wheezing or shortness of breath.  Continues potassium.  Left hip pain has been improved by padding her chair.  Past Medical History:  Diagnosis Date  . Allergy   . Arthritis   . GERD (gastroesophageal reflux disease)   . Hypertension   . Hypothyroidism   . Pneumonia   . Stroke [facility])    right arm is "not working", speech a little slurred  . Thyroid disease     Past Surgical History:  Procedure Laterality Date  . ABDOMINAL HYSTERECTOMY    . APPENDECTOMY    . BREAST SURGERY Right    lumpectomy  . CATARACT EXTRACTION    . CHOLECYSTECTOMY    . IR ANGIO INTRA EXTRACRAN SEL COM CAROTID INNOMINATE BILAT MOD SED  07/24/2019  . IR ANGIO VERTEBRAL SEL SUBCLAVIAN INNOMINATE UNI L MOD SED  07/24/2019  . IR INTRA CRAN STENT  07/24/2019  . RADIOLOGY WITH ANESTHESIA N/A 07/09/2019   Procedure: RADIOLOGY WITH ANESTHESIA STENT PLACEMENT;  Surgeon: Luanne Bras, MD;  Location: [place];  Service: Radiology;  Laterality: N/A;  . RADIOLOGY WITH ANESTHESIA N/A 07/23/2019   Procedure: RADIOLOGY WITH ANESTHESIA STENT PLACEMENT;  Surgeon: Luanne Bras, MD;  Location: [place];  Service: Radiology;  Laterality: N/A;  . RADIOLOGY WITH ANESTHESIA N/A 07/24/2019   Procedure: RADIOLOGY WITH ANESTHESIA STENT PLACEMENT;  Surgeon: Luanne Bras, MD;  Location: [place];  Service: Radiology;  Laterality: N/A;  . TUBAL LIGATION       History reviewed. No pertinent family history.  Social History   Socioeconomic History  . Marital status: Married    Spouse name: Not on file  . Number of children: Not on file  . Years of education: Not on file  . Highest education level: Not on file  Occupational History  . Not on file  Tobacco Use  . Smoking status: Former Smoker    Quit date: 1970    Years since quitting: 51.6  . Smokeless tobacco: Never Used  Vaping Use  . Vaping Use: Never used  Substance and Sexual Activity  . Alcohol use: Yes    Comment: occasional  . Drug use: Never  . Sexual activity: Not on file  Other Topics Concern  . Not on file  Social History Narrative  . Not on file   Social Determinants of Health   Financial Resource Strain:   . Difficulty of Paying Living Expenses:   Food Insecurity:   . Worried About Charity fundraiser in the Last Year:   . Arboriculturist in the Last Year:   Transportation Needs:   . Film/video editor (Medical):   Marland Kitchen Lack of Transportation (Non-Medical):   Physical Activity:   . Days of Exercise per Week:   . Minutes of Exercise per Session:   Stress:   . Feeling of Stress :  Social Connections:   . Frequency of Communication with Friends and Family:   . Frequency of Social Gatherings with Friends and Family:   . Attends Religious Services:   . Active Member of Clubs or Organizations:   . Attends Archivist Meetings:   Marland Kitchen Marital Status:   Intimate Partner Violence:   . Fear of Current or Ex-Partner:   . Emotionally Abused:   Marland Kitchen Physically Abused:   . Sexually Abused:     Outpatient Medications Prior to Visit  Medication Sig Dispense Refill  . acetaminophen (TYLENOL) 325 MG tablet Take 2 tablets (650 mg total) by mouth every 4 (four) hours as needed for mild pain (or temp > 37.5 C (99.5 F)).    Marland Kitchen aspirin EC 325 MG EC tablet Take 1 tablet (325 mg total) by mouth daily. 30 tablet 0  . clopidogrel (PLAVIX) 75 MG tablet Take 1 tablet  (75 mg total) by mouth daily. 30 tablet 1  . levocetirizine (XYZAL) 5 MG tablet TAKE 1 TABLET(5 MG) BY MOUTH EVERY EVENING 90 tablet 3  . pantoprazole (PROTONIX) 40 MG tablet TAKE 1 TABLET(40 MG) BY MOUTH AT BEDTIME 90 tablet 0  . potassium chloride SA (KLOR-CON) 20 MEQ tablet Take 2 tablets (40 mEq total) by mouth daily. 10 tablet 0  . amLODipine (NORVASC) 10 MG tablet Take 1 tablet (10 mg total) by mouth daily. 90 tablet 1  . atorvastatin (LIPITOR) 80 MG tablet Take 1 tablet (80 mg total) by mouth daily at 6 PM. 90 tablet 1  . chlorthalidone (HYGROTON) 25 MG tablet Take 1 tablet (25 mg total) by mouth daily. 90 tablet 1  . cloNIDine (CATAPRES) 0.1 MG tablet Take 1 tablet (0.1 mg total) by mouth 2 (two) times daily. 60 tablet 3  . levothyroxine (SYNTHROID) 137 MCG tablet TAKE 1 TABLET(137 MCG) BY MOUTH DAILY AT 6 AM 90 tablet 0  . fluticasone (FLONASE) 50 MCG/ACT nasal spray Place 2 sprays into both nostrils daily. (Patient not taking: Reported on 11/19/2019) 16 g 6  . potassium chloride SA (KLOR-CON) 20 MEQ tablet TAKE 1 TABLET(20 MEQ) BY MOUTH DAILY. START AFTER COMPLETION OF PRESCRIPTION FOR 40 MEQ DAILY (Patient not taking: Reported on 12/31/2019) 90 tablet 1   Facility-Administered Medications Prior to Visit  Medication Dose Route Frequency Provider Last Rate Last Admin  . midazolam (VERSED) 5 MG/5ML injection   Intravenous Anesthesia Intra-op Mariea Clonts, CRNA   1 mg at 07/09/19 0845    Allergies  Allergen Reactions  . Latex Itching and Rash  . Statins Other (See Comments)    Myalgias, "sick"   . Amoxicillin Rash    Did it involve swelling of the face/tongue/throat, SOB, or low BP? No Did it involve sudden or severe rash/hives, skin peeling, or any reaction on the inside of your mouth or nose? Yes Did you need to seek medical attention at a hospital or doctor's office? N/A When did it last happen? Child If all above answers are "NO", may proceed with cephalosporin use.  .  Codeine Nausea Only  . Penicillin G Rash    Did it involve swelling of the face/tongue/throat, SOB, or low BP? No Did it involve sudden or severe rash/hives, skin peeling, or any reaction on the inside of your mouth or nose? Yes Did you need to seek medical attention at a hospital or doctor's office? N/A When did it last happen?Child If all above answers are "NO", may proceed with cephalosporin use.    ROS  Review of Systems  Constitutional: Negative.   HENT: Negative.   Respiratory: Positive for cough. Negative for chest tightness, shortness of breath and wheezing.   Cardiovascular: Negative.  Negative for chest pain.  Gastrointestinal: Negative.   Genitourinary: Negative.   Musculoskeletal: Positive for arthralgias.  Skin: Negative.   Hematological: Does not bruise/bleed easily.  Psychiatric/Behavioral: Negative.       Objective:    Physical Exam Vitals and nursing note reviewed.  Constitutional:      General: She is not in acute distress.    Appearance: Normal appearance. She is not ill-appearing, toxic-appearing or diaphoretic.  HENT:     Head: Normocephalic and atraumatic.     Right Ear: External ear normal.     Left Ear: External ear normal.  Eyes:     General: No scleral icterus.       Right eye: No discharge.        Left eye: No discharge.     Conjunctiva/sclera: Conjunctivae normal.  Pulmonary:     Effort: Pulmonary effort is normal.  Musculoskeletal:     Lumbar back: No tenderness or bony tenderness. Negative right straight leg raise test and negative left straight leg raise test.       Back:  Skin:    General: Skin is warm and dry.  Neurological:     Mental Status: She is alert and oriented to person, place, and time.     Comments: Negative dural tension signs.   Psychiatric:        Mood and Affect: Mood normal.        Behavior: Behavior normal.     BP 120/70   Pulse (!) 54   Temp 97.6 F (36.4 C) (Tympanic)   Ht 5\' 4"  (1.626 m)   Wt 146 lb 6.4  oz (66.4 kg)   SpO2 96%   BMI 25.13 kg/m  Wt Readings from Last 3 Encounters:  12/31/19 146 lb 6.4 oz (66.4 kg)  12/21/19 150 lb 6.4 oz (68.2 kg)  11/21/19 148 lb 3.2 oz (67.2 kg)     Health Maintenance Due  Topic Date Due  . Hepatitis C Screening  Never done  . FOOT EXAM  Never done  . OPHTHALMOLOGY EXAM  Never done  . TETANUS/TDAP  Never done  . MAMMOGRAM  Never done  . COLONOSCOPY  Never done  . DEXA SCAN  Never done  . INFLUENZA VACCINE  12/23/2019    There are no preventive care reminders to display for this patient.  Lab Results  Component Value Date   TSH 1.43 11/21/2019   Lab Results  Component Value Date   WBC 6.8 11/21/2019   HGB 13.8 11/21/2019   HCT 39.3 11/21/2019   MCV 84.1 11/21/2019   PLT 330.0 11/21/2019   Lab Results  Component Value Date   NA 139 11/21/2019   K 3.2 (L) 12/21/2019   CO2 33 (H) 11/21/2019   GLUCOSE 119 (H) 11/21/2019   BUN 10 11/21/2019   CREATININE 0.66 11/21/2019   BILITOT 0.7 11/21/2019   ALKPHOS 46 11/21/2019   AST 13 11/21/2019   ALT 13 11/21/2019   PROT 6.9 11/21/2019   ALBUMIN 4.5 11/21/2019   CALCIUM 10.0 11/21/2019   ANIONGAP 9 07/25/2019   GFR 88.72 11/21/2019   Lab Results  Component Value Date   CHOL 143 11/21/2019   Lab Results  Component Value Date   HDL 44.30 11/21/2019   Lab Results  Component Value Date   LDLCALC 71  11/21/2019   Lab Results  Component Value Date   TRIG 136.0 11/21/2019   Lab Results  Component Value Date   CHOLHDL 3 11/21/2019   Lab Results  Component Value Date   HGBA1C 5.8 11/21/2019      Assessment & Plan:   Problem List Items Addressed This Visit      Cardiovascular and Mediastinum   Essential hypertension   Relevant Medications   chlorthalidone (HYGROTON) 25 MG tablet   cloNIDine (CATAPRES) 0.1 MG tablet   amLODipine (NORVASC) 10 MG tablet   atorvastatin (LIPITOR) 80 MG tablet   Other Relevant Orders   Basic metabolic panel   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[place]) - Primary   Relevant Medications   chlorthalidone (HYGROTON) 25 MG tablet   cloNIDine (CATAPRES) 0.1 MG tablet   amLODipine (NORVASC) 10 MG tablet   atorvastatin (LIPITOR) 80 MG tablet   Other Relevant Orders   CT ANGIO NECK W OR WO CONTRAST     Endocrine   Acquired hypothyroidism   Relevant Medications   levothyroxine (SYNTHROID) 137 MCG tablet     Other   Hypokalemia   Relevant Medications   potassium chloride SA (KLOR-CON) 20 MEQ tablet   Other Relevant Orders   Basic metabolic panel   Elevated LDL cholesterol level   Relevant Medications   atorvastatin (LIPITOR) 80 MG tablet   Cough   Relevant Orders   DG Chest 2 View (Completed)      Meds ordered this encounter  Medications  . chlorthalidone (HYGROTON) 25 MG tablet    Sig: Take 1 tablet (25 mg total) by mouth daily.    Dispense:  90 tablet    Refill:  1  . cloNIDine (CATAPRES) 0.1 MG tablet    Sig: Take 1 tablet (0.1 mg total) by mouth 2 (two) times daily.    Dispense:  60 tablet    Refill:  3  . potassium chloride SA (KLOR-CON) 20 MEQ tablet    Sig: TAKE 1 TABLET(20 MEQ) BY MOUTH DAILY.    Dispense:  90 tablet    Refill:  1    **Patient requests 90 days supply**  . amLODipine (NORVASC) 10 MG tablet    Sig: Take 1 tablet (10 mg total) by mouth daily.    Dispense:  90 tablet    Refill:  1  . atorvastatin (LIPITOR) 80 MG tablet    Sig: Take 1 tablet (80 mg total) by mouth daily at 6 PM.    Dispense:  90 tablet    Refill:  1  . levothyroxine (SYNTHROID) 137 MCG tablet    Sig: TAKE 1 TABLET(137 MCG) BY MOUTH DAILY AT 6 AM    Dispense:  90 tablet    Refill:  0    Follow-up: Return in about 1 month (around 01/31/2020).   Continue all meds.  Continue exercising.  Follow-up in 1 month for recheck of potassium.  Checking chest x-ray due to persisting cough. Libby Maw, MD

## 2020-01-02 NOTE — Addendum Note (Signed)
Addended by: Jon Billings on: 01/02/2020 09:14 AM   Modules accepted: Orders

## 2020-01-08 DIAGNOSIS — I69351 Hemiplegia and hemiparesis following cerebral infarction affecting right dominant side: Secondary | ICD-10-CM | POA: Diagnosis not present

## 2020-01-09 ENCOUNTER — Telehealth: Payer: Self-pay | Admitting: Family Medicine

## 2020-01-09 NOTE — Telephone Encounter (Signed)
Patient is calling, requesting a letter from her provider saying that she is capable of taking care of her 69 y/o great grandson. She needs this letter because of her previous Stroke in January. If approved, she will need this before 9am tomorrow to take to court (patient aware that provider is out of the office this afternoon). Please call her back at [phone] if you have any questions.

## 2020-01-09 NOTE — Telephone Encounter (Signed)
Please advise message below, okay for note?

## 2020-01-09 NOTE — Telephone Encounter (Signed)
Patient has some questions regarding her CT Scan next week and the location. Please give her a call back at [phone].  Thank you

## 2020-01-10 ENCOUNTER — Encounter: Payer: Self-pay | Admitting: Family Medicine

## 2020-01-10 NOTE — Telephone Encounter (Signed)
Patient well, capable and of soundness of mind to care for her grandchild.

## 2020-01-10 NOTE — Telephone Encounter (Signed)
Patient returned call and I let her know the letter is ready to be picked up.

## 2020-01-10 NOTE — Telephone Encounter (Signed)
Patient called to check status of letter. I let her know we will call her when letter is ready to be picked up.

## 2020-01-10 NOTE — Telephone Encounter (Signed)
LM for pt to call back about CT scan location and questions.

## 2020-01-10 NOTE — Telephone Encounter (Signed)
Tried reaching patient to inform her of the note that's ready for pick up but did not get an answer.

## 2020-01-14 ENCOUNTER — Other Ambulatory Visit: Payer: Self-pay | Admitting: Family Medicine

## 2020-01-14 DIAGNOSIS — I10 Essential (primary) hypertension: Secondary | ICD-10-CM

## 2020-01-15 ENCOUNTER — Telehealth: Payer: Self-pay | Admitting: Family Medicine

## 2020-01-15 NOTE — Telephone Encounter (Signed)
Caryl Pina calling for verbal order to do both Dr. Ethelene Hal and Dr. Arlean Hopping order for CT head and neck done at the same time. Okay given for both to be preformed.

## 2020-01-15 NOTE — Telephone Encounter (Signed)
Megan Martinez is calling and requesting a call back regarding an order that was put in, please advise. CB is [phone]

## 2020-01-16 ENCOUNTER — Ambulatory Visit (HOSPITAL_COMMUNITY): Payer: PPO

## 2020-01-23 ENCOUNTER — Other Ambulatory Visit (INDEPENDENT_AMBULATORY_CARE_PROVIDER_SITE_OTHER): Payer: PPO

## 2020-01-23 ENCOUNTER — Telehealth: Payer: Self-pay | Admitting: Family Medicine

## 2020-01-23 ENCOUNTER — Other Ambulatory Visit: Payer: Self-pay

## 2020-01-23 DIAGNOSIS — E876 Hypokalemia: Secondary | ICD-10-CM

## 2020-01-23 DIAGNOSIS — I10 Essential (primary) hypertension: Secondary | ICD-10-CM

## 2020-01-23 LAB — BASIC METABOLIC PANEL
BUN: 7 mg/dL (ref 6–23)
CO2: 36 mEq/L — ABNORMAL HIGH (ref 19–32)
Calcium: 10.4 mg/dL (ref 8.4–10.5)
Chloride: 93 mEq/L — ABNORMAL LOW (ref 96–112)
Creatinine, Ser: 0.74 mg/dL (ref 0.40–1.20)
GFR: 77.7 mL/min (ref >60.00)
Glucose, Bld: 121 mg/dL — ABNORMAL HIGH (ref 70–99)
Potassium: 2.8 mEq/L — CL (ref 3.5–5.1)
Sodium: 137 mEq/L (ref 135–145)

## 2020-01-23 NOTE — Telephone Encounter (Signed)
Spoke with patient informed of critical potassium levels. Patient verbally understood she should increase potassium medication to 40mg  (2 tabs) daily for 5 days then go back down to 1 tab.

## 2020-01-23 NOTE — Telephone Encounter (Signed)
Called with critical potassium level of 2.8. On review of pts chart, this has been an issue in the past and she is currently on KCl 37meq daily. Recommend increasing to 40mg  (2 53meq tabs) daily x 5 days then return to 64meq 1 tab daily. Tequila, please call pt to make her aware. I will cc PCP on this encounter

## 2020-01-23 NOTE — Addendum Note (Signed)
Addended by: Lynnea Ferrier on: 01/23/2020 08:12 AM   Modules accepted: Orders

## 2020-01-24 ENCOUNTER — Other Ambulatory Visit: Payer: PPO

## 2020-01-24 NOTE — Telephone Encounter (Signed)
Agreed with above direction. Please make sure that she sees me as scheduled.

## 2020-01-31 ENCOUNTER — Ambulatory Visit (HOSPITAL_COMMUNITY): Payer: PPO

## 2020-02-01 ENCOUNTER — Ambulatory Visit: Payer: PPO | Admitting: Family Medicine

## 2020-02-06 ENCOUNTER — Other Ambulatory Visit: Payer: Self-pay

## 2020-02-07 ENCOUNTER — Ambulatory Visit (INDEPENDENT_AMBULATORY_CARE_PROVIDER_SITE_OTHER): Payer: PPO

## 2020-02-07 ENCOUNTER — Ambulatory Visit (INDEPENDENT_AMBULATORY_CARE_PROVIDER_SITE_OTHER): Payer: PPO | Admitting: Nurse Practitioner

## 2020-02-07 ENCOUNTER — Encounter: Payer: Self-pay | Admitting: Nurse Practitioner

## 2020-02-07 VITALS — BP 136/76 | HR 80 | Temp 97.1°F | Ht 64.0 in | Wt 144.0 lb

## 2020-02-07 DIAGNOSIS — M47817 Spondylosis without myelopathy or radiculopathy, lumbosacral region: Secondary | ICD-10-CM | POA: Diagnosis not present

## 2020-02-07 DIAGNOSIS — M25552 Pain in left hip: Secondary | ICD-10-CM

## 2020-02-07 DIAGNOSIS — M47816 Spondylosis without myelopathy or radiculopathy, lumbar region: Secondary | ICD-10-CM | POA: Diagnosis not present

## 2020-02-07 DIAGNOSIS — J209 Acute bronchitis, unspecified: Secondary | ICD-10-CM

## 2020-02-07 MED ORDER — BENZONATATE 100 MG PO CAPS: 100.0000 mg | ORAL_CAPSULE | Freq: Three times a day (TID) | ORAL | 0 refills | Status: DC | PRN

## 2020-02-07 MED ORDER — METHYLPREDNISOLONE 4 MG PO TBPK: ORAL_TABLET | ORAL | 0 refills | Status: DC

## 2020-02-07 NOTE — Progress Notes (Signed)
Subjective:  Patient ID: Megan Martinez, female    DOB: 1951/05/07  Age: 69 y.o. MRN: [id number]  CC: Acute Visit (Pt c/o left hip and leg pain x2 months. Pt has took otc medication (tylenol) and tried some exercise but nothing has gave her relief. Pt also states she has a cough that has been bothering her x2 weeks but she has been tested last week for COVID and it was negative and she is fully vaccinated. )   Cough This is a new problem. The current episode started more than 1 month ago. The problem has been unchanged. The problem occurs constantly. The cough is non-productive. Associated symptoms include wheezing. Pertinent negatives include no chest pain, chills, fever, headaches, heartburn, myalgias, nasal congestion, postnasal drip, rhinorrhea, sore throat or shortness of breath. The symptoms are aggravated by lying down. She has tried nothing for the symptoms. Her past medical history is significant for bronchitis. There is no history of asthma or environmental allergies.  Hip Pain  There was no injury mechanism. The pain is present in the left hip. The quality of the pain is described as aching and cramping. The pain has been constant since onset. Pertinent negatives include no inability to bear weight, loss of motion, muscle weakness, numbness or tingling. She reports no foreign bodies present. The symptoms are aggravated by weight bearing and movement. She has tried acetaminophen and rest for the symptoms. The treatment provided no relief.  negative COVID test this Monday CXR completed 2month ago indicated bronchial thickening  Reviewed past Medical, Social and Family history today.  Outpatient Medications Prior to Visit  Medication Sig Dispense Refill  . acetaminophen (TYLENOL) 325 MG tablet Take 2 tablets (650 mg total) by mouth every 4 (four) hours as needed for mild pain (or temp > 37.5 C (99.5 F)).    Marland Kitchen amLODipine (NORVASC) 10 MG tablet Take 1 tablet (10 mg total) by mouth daily.  90 tablet 1  . aspirin EC 325 MG EC tablet Take 1 tablet (325 mg total) by mouth daily. 30 tablet 0  . atorvastatin (LIPITOR) 80 MG tablet Take 1 tablet (80 mg total) by mouth daily at 6 PM. 90 tablet 1  . chlorthalidone (HYGROTON) 25 MG tablet TAKE 1 TABLET(25 MG) BY MOUTH DAILY 90 tablet 1  . cloNIDine (CATAPRES) 0.1 MG tablet Take 1 tablet (0.1 mg total) by mouth 2 (two) times daily. 60 tablet 3  . clopidogrel (PLAVIX) 75 MG tablet Take 1 tablet (75 mg total) by mouth daily. 30 tablet 1  . fluticasone (FLONASE) 50 MCG/ACT nasal spray Place 2 sprays into both nostrils daily. 16 g 6  . levocetirizine (XYZAL) 5 MG tablet TAKE 1 TABLET(5 MG) BY MOUTH EVERY EVENING 90 tablet 3  . levothyroxine (SYNTHROID) 137 MCG tablet TAKE 1 TABLET(137 MCG) BY MOUTH DAILY AT 6 AM 90 tablet 0  . pantoprazole (PROTONIX) 40 MG tablet TAKE 1 TABLET(40 MG) BY MOUTH AT BEDTIME 90 tablet 0  . potassium chloride SA (KLOR-CON) 20 MEQ tablet Take 2 tablets (40 mEq total) by mouth daily. 10 tablet 0  . potassium chloride SA (KLOR-CON) 20 MEQ tablet TAKE 1 TABLET(20 MEQ) BY MOUTH DAILY. 90 tablet 1   Facility-Administered Medications Prior to Visit  Medication Dose Route Frequency Provider Last Rate Last Admin  . midazolam (VERSED) 5 MG/5ML injection   Intravenous Anesthesia Intra-op Mariea Clonts, CRNA   1 mg at 07/09/19 0845    ROS See HPI  Objective:  BP 136/76 (  BP Location: Left Arm, Patient Position: Sitting, Cuff Size: Normal)   Pulse 80   Temp (!) 97.1 F (36.2 C) (Temporal)   Ht 5\' 4"  (1.626 m)   Wt 144 lb (65.3 kg)   SpO2 97%   BMI 24.72 kg/m   Physical Exam Cardiovascular:     Rate and Rhythm: Normal rate and regular rhythm.     Pulses: Normal pulses.     Heart sounds: Normal heart sounds.  Pulmonary:     Effort: Pulmonary effort is normal.     Breath sounds: Normal breath sounds.  Musculoskeletal:        General: No tenderness.     Lumbar back: No tenderness. Normal range of motion.      Left hip: Normal.     Right lower leg: No edema.     Left lower leg: Normal. No edema.  Skin:    Findings: No erythema or rash.  Neurological:     Mental Status: She is alert and oriented to person, place, and time.    Assessment & Plan:  This visit occurred during the SARS-CoV-2 public health emergency.  Safety protocols were in place, including screening questions prior to the visit, additional usage of staff PPE, and extensive cleaning of exam room while observing appropriate contact time as indicated for disinfecting solutions.   Megan Martinez was seen today for acute visit.  Diagnoses and all orders for this visit:  Left hip pain -     Cancel: DG HIP UNILAT WITH PELVIS MIN 4 VIEWS LEFT -     DG HIP UNILAT W OR W/O PELVIS 2-3 VIEWS LEFT  Acute bronchitis, unspecified organism -     benzonatate (TESSALON) 100 MG capsule; Take 1 capsule (100 mg total) by mouth 3 (three) times daily as needed for cough. -     methylPREDNISolone (MEDROL DOSEPAK) 4 MG TBPK tablet; Take as directed on package    Problem List Items Addressed This Visit    None    Visit Diagnoses    Left hip pain    -  Primary   Relevant Orders   DG HIP UNILAT W OR W/O PELVIS 2-3 VIEWS LEFT   Acute bronchitis, unspecified organism       Relevant Medications   benzonatate (TESSALON) 100 MG capsule   methylPREDNISolone (MEDROL DOSEPAK) 4 MG TBPK tablet      Follow-up: No follow-ups on file.  Wilfred Lacy, NP

## 2020-02-07 NOTE — Patient Instructions (Signed)
Go to lab for hip x-ray Start oral prednisone and benzonatate for acte bronchitis

## 2020-02-08 ENCOUNTER — Other Ambulatory Visit: Payer: Self-pay | Admitting: Nurse Practitioner

## 2020-02-08 DIAGNOSIS — I69351 Hemiplegia and hemiparesis following cerebral infarction affecting right dominant side: Secondary | ICD-10-CM | POA: Diagnosis not present

## 2020-02-08 DIAGNOSIS — M5137 Other intervertebral disc degeneration, lumbosacral region: Secondary | ICD-10-CM

## 2020-02-08 DIAGNOSIS — M25552 Pain in left hip: Secondary | ICD-10-CM

## 2020-02-11 ENCOUNTER — Telehealth (HOSPITAL_COMMUNITY): Payer: Self-pay

## 2020-02-11 NOTE — Telephone Encounter (Signed)
Called to reschedule cta head/neck, no answer, left vm. AW

## 2020-02-13 ENCOUNTER — Encounter: Payer: Self-pay | Admitting: Family Medicine

## 2020-02-13 ENCOUNTER — Ambulatory Visit (INDEPENDENT_AMBULATORY_CARE_PROVIDER_SITE_OTHER): Payer: PPO | Admitting: Family Medicine

## 2020-02-13 ENCOUNTER — Other Ambulatory Visit: Payer: Self-pay

## 2020-02-13 VITALS — BP 118/64 | HR 65 | Temp 97.2°F | Ht 64.0 in | Wt 144.6 lb

## 2020-02-13 DIAGNOSIS — M5432 Sciatica, left side: Secondary | ICD-10-CM | POA: Diagnosis not present

## 2020-02-13 DIAGNOSIS — I10 Essential (primary) hypertension: Secondary | ICD-10-CM | POA: Diagnosis not present

## 2020-02-13 DIAGNOSIS — E876 Hypokalemia: Secondary | ICD-10-CM | POA: Diagnosis not present

## 2020-02-13 LAB — BASIC METABOLIC PANEL
BUN: 10 mg/dL (ref 6–23)
CO2: 37 mEq/L — ABNORMAL HIGH (ref 19–32)
Calcium: 10.2 mg/dL (ref 8.4–10.5)
Chloride: 90 mEq/L — ABNORMAL LOW (ref 96–112)
Creatinine, Ser: 0.71 mg/dL (ref 0.40–1.20)
GFR: 81.49 mL/min (ref >60.00)
Glucose, Bld: 168 mg/dL — ABNORMAL HIGH (ref 70–99)
Potassium: 3.9 mEq/L (ref 3.5–5.1)
Sodium: 137 mEq/L (ref 135–145)

## 2020-02-13 MED ORDER — CLONIDINE HCL 0.2 MG PO TABS: 0.2000 mg | ORAL_TABLET | Freq: Two times a day (BID) | ORAL | 2 refills | Status: DC

## 2020-02-13 MED ORDER — GABAPENTIN 100 MG PO CAPS: ORAL_CAPSULE | ORAL | 1 refills | Status: DC

## 2020-02-13 NOTE — Progress Notes (Signed)
Established Patient Office Visit  Subjective:  Patient ID: Megan Martinez, female    DOB: 01-21-1951  Age: 69 y.o. MRN: [id number]  CC:  Chief Complaint  Patient presents with  . Follow-up    1 month follow up states that hip still hurting.     HPI REJOICE HEATWOLE presents for follow-up of hypertension, hypokalemia and pain in her left hip.  She has also caring for a young child and social services has been involved she tells me.  She has been taking her potassium pills as directed.  Hypokalemia seems to be a problem and the likely causes her chlorthalidone.  Continues to have pain moving from her left buttock down the back of her left leg.  Denies saddle paresthesias or changes in her bowel or bladder function.  Recent x-ray of her left hip was normal.  Past Medical History:  Diagnosis Date  . Allergy   . Arthritis   . GERD (gastroesophageal reflux disease)   . Hypertension   . Hypothyroidism   . Pneumonia   . Stroke [facility])    right arm is "not working", speech a little slurred  . Thyroid disease     Past Surgical History:  Procedure Laterality Date  . ABDOMINAL HYSTERECTOMY    . APPENDECTOMY    . BREAST SURGERY Right    lumpectomy  . CATARACT EXTRACTION    . CHOLECYSTECTOMY    . IR ANGIO INTRA EXTRACRAN SEL COM CAROTID INNOMINATE BILAT MOD SED  07/24/2019  . IR ANGIO VERTEBRAL SEL SUBCLAVIAN INNOMINATE UNI L MOD SED  07/24/2019  . IR INTRA CRAN STENT  07/24/2019  . RADIOLOGY WITH ANESTHESIA N/A 07/09/2019   Procedure: RADIOLOGY WITH ANESTHESIA STENT PLACEMENT;  Surgeon: Luanne Bras, MD;  Location: [place];  Service: Radiology;  Laterality: N/A;  . RADIOLOGY WITH ANESTHESIA N/A 07/23/2019   Procedure: RADIOLOGY WITH ANESTHESIA STENT PLACEMENT;  Surgeon: Luanne Bras, MD;  Location: [place];  Service: Radiology;  Laterality: N/A;  . RADIOLOGY WITH ANESTHESIA N/A 07/24/2019   Procedure: RADIOLOGY WITH ANESTHESIA STENT PLACEMENT;  Surgeon: Luanne Bras, MD;  Location:  [place];  Service: Radiology;  Laterality: N/A;  . TUBAL LIGATION      History reviewed. No pertinent family history.  Social History   Socioeconomic History  . Marital status: Married    Spouse name: Not on file  . Number of children: Not on file  . Years of education: Not on file  . Highest education level: Not on file  Occupational History  . Not on file  Tobacco Use  . Smoking status: Former Smoker    Quit date: 1970    Years since quitting: 51.7  . Smokeless tobacco: Never Used  Vaping Use  . Vaping Use: Never used  Substance and Sexual Activity  . Alcohol use: Yes    Comment: occasional  . Drug use: Never  . Sexual activity: Not on file  Other Topics Concern  . Not on file  Social History Narrative  . Not on file   Social Determinants of Health   Financial Resource Strain:   . Difficulty of Paying Living Expenses: Not on file  Food Insecurity:   . Worried About Charity fundraiser in the Last Year: Not on file  . Ran Out of Food in the Last Year: Not on file  Transportation Needs:   . Lack of Transportation (Medical): Not on file  . Lack of Transportation (Non-Medical): Not on file  Physical Activity:   .  Days of Exercise per Week: Not on file  . Minutes of Exercise per Session: Not on file  Stress:   . Feeling of Stress : Not on file  Social Connections:   . Frequency of Communication with Friends and Family: Not on file  . Frequency of Social Gatherings with Friends and Family: Not on file  . Attends Religious Services: Not on file  . Active Member of Clubs or Organizations: Not on file  . Attends Archivist Meetings: Not on file  . Marital Status: Not on file  Intimate Partner Violence:   . Fear of Current or Ex-Partner: Not on file  . Emotionally Abused: Not on file  . Physically Abused: Not on file  . Sexually Abused: Not on file    Outpatient Medications Prior to Visit  Medication Sig Dispense Refill  . acetaminophen (TYLENOL) 325 MG  tablet Take 2 tablets (650 mg total) by mouth every 4 (four) hours as needed for mild pain (or temp > 37.5 C (99.5 F)).    Marland Kitchen amLODipine (NORVASC) 10 MG tablet Take 1 tablet (10 mg total) by mouth daily. 90 tablet 1  . aspirin EC 325 MG EC tablet Take 1 tablet (325 mg total) by mouth daily. 30 tablet 0  . atorvastatin (LIPITOR) 80 MG tablet Take 1 tablet (80 mg total) by mouth daily at 6 PM. 90 tablet 1  . benzonatate (TESSALON) 100 MG capsule Take 1 capsule (100 mg total) by mouth 3 (three) times daily as needed for cough. 20 capsule 0  . clopidogrel (PLAVIX) 75 MG tablet Take 1 tablet (75 mg total) by mouth daily. 30 tablet 1  . fluticasone (FLONASE) 50 MCG/ACT nasal spray Place 2 sprays into both nostrils daily. 16 g 6  . levocetirizine (XYZAL) 5 MG tablet TAKE 1 TABLET(5 MG) BY MOUTH EVERY EVENING 90 tablet 3  . levothyroxine (SYNTHROID) 137 MCG tablet TAKE 1 TABLET(137 MCG) BY MOUTH DAILY AT 6 AM 90 tablet 0  . pantoprazole (PROTONIX) 40 MG tablet TAKE 1 TABLET(40 MG) BY MOUTH AT BEDTIME 90 tablet 0  . potassium chloride SA (KLOR-CON) 20 MEQ tablet Take 2 tablets (40 mEq total) by mouth daily. 10 tablet 0  . chlorthalidone (HYGROTON) 25 MG tablet TAKE 1 TABLET(25 MG) BY MOUTH DAILY 90 tablet 1  . cloNIDine (CATAPRES) 0.1 MG tablet Take 1 tablet (0.1 mg total) by mouth 2 (two) times daily. 60 tablet 3  . methylPREDNISolone (MEDROL DOSEPAK) 4 MG TBPK tablet Take as directed on package (Patient not taking: Reported on 02/13/2020) 21 tablet 0  . potassium chloride SA (KLOR-CON) 20 MEQ tablet TAKE 1 TABLET(20 MEQ) BY MOUTH DAILY. (Patient not taking: Reported on 02/13/2020) 90 tablet 1   Facility-Administered Medications Prior to Visit  Medication Dose Route Frequency Provider Last Rate Last Admin  . midazolam (VERSED) 5 MG/5ML injection   Intravenous Anesthesia Intra-op Mariea Clonts, CRNA   1 mg at 07/09/19 0845    Allergies  Allergen Reactions  . Latex Itching and Rash  . Statins Other  (See Comments)    Myalgias, "sick"   . Amoxicillin Rash    Did it involve swelling of the face/tongue/throat, SOB, or low BP? No Did it involve sudden or severe rash/hives, skin peeling, or any reaction on the inside of your mouth or nose? Yes Did you need to seek medical attention at a hospital or doctor's office? N/A When did it last happen? Child If all above answers are "NO", may proceed  with cephalosporin use.  . Codeine Nausea Only  . Penicillin G Rash    Did it involve swelling of the face/tongue/throat, SOB, or low BP? No Did it involve sudden or severe rash/hives, skin peeling, or any reaction on the inside of your mouth or nose? Yes Did you need to seek medical attention at a hospital or doctor's office? N/A When did it last happen?Child If all above answers are "NO", may proceed with cephalosporin use.    ROS Review of Systems  Constitutional: Negative.   HENT: Negative.   Eyes: Negative for photophobia and visual disturbance.  Respiratory: Negative.   Cardiovascular: Negative.   Gastrointestinal: Negative.   Endocrine: Negative for polyphagia and polyuria.  Genitourinary: Negative.   Musculoskeletal: Negative for back pain.  Neurological: Negative for weakness and numbness.  Hematological: Does not bruise/bleed easily.  Psychiatric/Behavioral: Negative.       Objective:    Physical Exam Vitals and nursing note reviewed.  Constitutional:      Appearance: Normal appearance.  Cardiovascular:     Rate and Rhythm: Normal rate and regular rhythm.  Pulmonary:     Effort: Pulmonary effort is normal.     Breath sounds: Normal breath sounds.  Musculoskeletal:     Cervical back: No rigidity or tenderness.     Lumbar back: No bony tenderness. Normal range of motion. Negative right straight leg raise test and negative left straight leg raise test.       Back:  Lymphadenopathy:     Cervical: No cervical adenopathy.  Skin:    General: Skin is warm and dry.    Neurological:     Mental Status: She is oriented to person, place, and time.  Psychiatric:        Mood and Affect: Mood normal.        Behavior: Behavior normal.     BP 118/64   Pulse 65   Temp (!) 97.2 F (36.2 C) (Tympanic)   Ht 5\' 4"  (1.626 m)   Wt 144 lb 9.6 oz (65.6 kg)   SpO2 95%   BMI 24.82 kg/m  Wt Readings from Last 3 Encounters:  02/13/20 144 lb 9.6 oz (65.6 kg)  02/07/20 144 lb (65.3 kg)  12/31/19 146 lb 6.4 oz (66.4 kg)     Health Maintenance Due  Topic Date Due  . Hepatitis C Screening  Never done  . FOOT EXAM  Never done  . OPHTHALMOLOGY EXAM  Never done  . TETANUS/TDAP  Never done  . MAMMOGRAM  Never done  . COLONOSCOPY  Never done  . DEXA SCAN  Never done  . INFLUENZA VACCINE  12/23/2019    There are no preventive care reminders to display for this patient.  Lab Results  Component Value Date   TSH 1.43 11/21/2019   Lab Results  Component Value Date   WBC 6.8 11/21/2019   HGB 13.8 11/21/2019   HCT 39.3 11/21/2019   MCV 84.1 11/21/2019   PLT 330.0 11/21/2019   Lab Results  Component Value Date   NA 137 01/23/2020   K 2.8 (LL) 01/23/2020   CO2 36 (H) 01/23/2020   GLUCOSE 121 (H) 01/23/2020   BUN 7 01/23/2020   CREATININE 0.74 01/23/2020   BILITOT 0.7 11/21/2019   ALKPHOS 46 11/21/2019   AST 13 11/21/2019   ALT 13 11/21/2019   PROT 6.9 11/21/2019   ALBUMIN 4.5 11/21/2019   CALCIUM 10.4 01/23/2020   ANIONGAP 9 07/25/2019   GFR 77.70 01/23/2020  Lab Results  Component Value Date   CHOL 143 11/21/2019   Lab Results  Component Value Date   HDL 44.30 11/21/2019   Lab Results  Component Value Date   LDLCALC 71 11/21/2019   Lab Results  Component Value Date   TRIG 136.0 11/21/2019   Lab Results  Component Value Date   CHOLHDL 3 11/21/2019   Lab Results  Component Value Date   HGBA1C 5.8 11/21/2019      Assessment & Plan:   Problem List Items Addressed This Visit      Cardiovascular and Mediastinum   Essential  hypertension   Relevant Medications   cloNIDine (CATAPRES) 0.2 MG tablet   Other Relevant Orders   Basic metabolic panel     Other   Hypokalemia - Primary   Relevant Orders   Basic metabolic panel    Other Visit Diagnoses    Sciatica of left side       Relevant Medications   gabapentin (NEURONTIN) 100 MG capsule      Meds ordered this encounter  Medications  . cloNIDine (CATAPRES) 0.2 MG tablet    Sig: Take 1 tablet (0.2 mg total) by mouth 2 (two) times daily.    Dispense:  60 tablet    Refill:  2  . gabapentin (NEURONTIN) 100 MG capsule    Sig: Take one at night for a week and then increase to one twice daily.    Dispense:  60 capsule    Refill:  1    Follow-up: Return in about 1 month (around 03/14/2020), or Stop chlorthalidone.  Increase clonidine to 0.2 mg twice daily..  Have discontinued chlorthalidone secondary to chronic hypokalemia.  Have increased clonidine 0.2 twice daily.  Hopefully will be able to discontinue potassium supplementation pending lab results.  Have started patient on Neurontin at low dose and will gradually increase.  Libby Maw, MD

## 2020-02-14 ENCOUNTER — Telehealth: Payer: Self-pay | Admitting: Student

## 2020-02-14 NOTE — Telephone Encounter (Signed)
Refill of Plavix 75mg  #30 with 2 refills called into [facility].   Brynda Greathouse, MS RD PA-C

## 2020-02-14 NOTE — Telephone Encounter (Signed)
She no longer needs to take Potassium.

## 2020-02-19 ENCOUNTER — Encounter: Payer: PPO | Admitting: Physical Medicine & Rehabilitation

## 2020-02-25 ENCOUNTER — Other Ambulatory Visit: Payer: Self-pay | Admitting: Family Medicine

## 2020-02-25 DIAGNOSIS — E7800 Pure hypercholesterolemia, unspecified: Secondary | ICD-10-CM

## 2020-03-09 DIAGNOSIS — I69351 Hemiplegia and hemiparesis following cerebral infarction affecting right dominant side: Secondary | ICD-10-CM | POA: Diagnosis not present

## 2020-03-12 ENCOUNTER — Encounter: Payer: Self-pay | Admitting: Physical Therapy

## 2020-03-12 ENCOUNTER — Other Ambulatory Visit: Payer: Self-pay

## 2020-03-12 ENCOUNTER — Ambulatory Visit: Payer: PPO | Attending: Nurse Practitioner | Admitting: Physical Therapy

## 2020-03-12 DIAGNOSIS — R262 Difficulty in walking, not elsewhere classified: Secondary | ICD-10-CM | POA: Insufficient documentation

## 2020-03-12 DIAGNOSIS — M5442 Lumbago with sciatica, left side: Secondary | ICD-10-CM | POA: Diagnosis not present

## 2020-03-12 DIAGNOSIS — R252 Cramp and spasm: Secondary | ICD-10-CM | POA: Insufficient documentation

## 2020-03-12 NOTE — Therapy (Signed)
[place] [place], [place], [address] Phone: [phone]   Fax:  [phone]  Physical Therapy Evaluation  Patient Details  Name: Megan Martinez MRN: [id number] Date of Birth: 1950/08/05 Referring Provider (PT): Nche   Encounter Date: 03/12/2020   PT End of Session - 03/12/20 1058    Visit Number 1    Date for PT Re-Evaluation 05/12/20    PT Start Time 1016    PT Stop Time 1053    PT Time Calculation (min) 37 min    Activity Tolerance Patient tolerated treatment well    Behavior During Therapy [facility] for tasks assessed/performed           Past Medical History:  Diagnosis Date  . Allergy   . Arthritis   . GERD (gastroesophageal reflux disease)   . Hypertension   . Hypothyroidism   . Pneumonia   . Stroke [facility])    right arm is "not working", speech a little slurred  . Thyroid disease     Past Surgical History:  Procedure Laterality Date  . ABDOMINAL HYSTERECTOMY    . APPENDECTOMY    . BREAST SURGERY Right    lumpectomy  . CATARACT EXTRACTION    . CHOLECYSTECTOMY    . IR ANGIO INTRA EXTRACRAN SEL COM CAROTID INNOMINATE BILAT MOD SED  07/24/2019  . IR ANGIO VERTEBRAL SEL SUBCLAVIAN INNOMINATE UNI L MOD SED  07/24/2019  . IR INTRA CRAN STENT  07/24/2019  . RADIOLOGY WITH ANESTHESIA N/A 07/09/2019   Procedure: RADIOLOGY WITH ANESTHESIA STENT PLACEMENT;  Surgeon: Luanne Bras, MD;  Location: [place];  Service: Radiology;  Laterality: N/A;  . RADIOLOGY WITH ANESTHESIA N/A 07/23/2019   Procedure: RADIOLOGY WITH ANESTHESIA STENT PLACEMENT;  Surgeon: Luanne Bras, MD;  Location: [place];  Service: Radiology;  Laterality: N/A;  . RADIOLOGY WITH ANESTHESIA N/A 07/24/2019   Procedure: RADIOLOGY WITH ANESTHESIA STENT PLACEMENT;  Surgeon: Luanne Bras, MD;  Location: [place];  Service: Radiology;  Laterality: N/A;  . TUBAL LIGATION      There were no vitals filed for this visit.    Subjective Assessment -  03/12/20 1015    Subjective Pt reports that she has had nerve pain running down the back of her leg for the past 2-3 months. Pt states that pain starts in buttocks area and runs all the day to her L foot. Pt has trouble sitting with weight on L side d/t pain. Pt recently started taking care of 69 year old and is having trouble keeping up with them because she can't sleep during the night. Pt had xray of hip which was negative for fx/dislocation/arthritis.    Pertinent History stroke affecting R side    Limitations Sitting;Standing;Walking    How long can you walk comfortably? limits walking    Diagnostic tests xray L hip    Patient Stated Goals reduce pain    Currently in Pain? Yes    Pain Score 8     Pain Location Buttocks    Pain Orientation Left    Pain Descriptors / Indicators Radiating;Aching;Sharp    Pain Type Acute pain    Pain Radiating Towards L foot    Pain Onset More than a month ago    Pain Frequency Constant    Aggravating Factors  sitting on L side, prolonged standing/walking, sleeping    Pain Relieving Factors tylenol              [facility] PT Assessment -  03/12/20 0001      Assessment   Medical Diagnosis L LBP with radiating pain    Referring Provider (PT) Nche    Hand Dominance Right    Next MD Visit --   mid november   Prior Therapy PT for RUE      Precautions   Precautions None      Restrictions   Weight Bearing Restrictions No      Balance Screen   Has the patient fallen in the past 6 months No    Has the patient had a decrease in activity level because of a fear of falling?  No    Is the patient reluctant to leave their home because of a fear of falling?  No      Home Environment   Additional Comments laundry is in the basement, but just had  stair lift installed.  lives alone, did housework and Haematologist, likes to garden and take care of flowers      Prior Function   Level of Independence Independent      Functional Tests   Functional tests Sit to  Stand      Sit to Stand   Comments [facility]      Posture/Postural Control   Posture Comments leans to R in sitting      ROM / Strength   AROM / PROM / Strength AROM;Strength      AROM   Overall AROM Comments lumbar AROM with pain end range L lat flexion and lumbar flexion      Strength   Overall Strength Comments RLE strength 4+/5, LLE strength 5/5      Flexibility   Soft Tissue Assessment /Muscle Length yes    Hamstrings tight and painful on L    Quadriceps tight B L>R    Piriformis tight B L>R      Palpation   Palpation comment very tender to palpation L piriformis/glutemed      Special Tests   Other special tests + SLR on L 70 deg      Transfers   Five time sit to stand comments  WFL                      Objective measurements completed on examination: See above findings.       [facility] Adult PT Treatment/Exercise - 03/12/20 0001      Exercises   Exercises Lumbar      Lumbar Exercises: Stretches   Active Hamstring Stretch Right;Left;1 rep;30 seconds    Single Knee to Chest Stretch 5 reps;10 seconds    Lower Trunk Rotation 5 reps;10 seconds    Piriformis Stretch Right;1 rep;Left;30 seconds                  PT Education - 03/12/20 1058    Education Details Pt educated on POC and HEP    Person(s) Educated Patient    Methods Explanation;Demonstration;Handout    Comprehension Verbalized understanding;Returned demonstration            PT Short Term Goals - 03/12/20 1119      PT SHORT TERM GOAL #1   Title Independent with initial HEP    Time 2    Period Weeks    Status New    Target Date 03/26/20             PT Long Term Goals - 03/12/20 1119      PT LONG TERM GOAL #1   Title  Pt will report resolution of radiating pain in LLE    Time 6    Period Weeks    Status New    Target Date 04/23/20      PT LONG TERM GOAL #2   Title Pt will report 50% reduction in pain    Time 6    Period Weeks    Status New    Target Date  04/23/20      PT LONG TERM GOAL #3   Title Pt will demo L hamstring flexibility to 90 deg with no increase in radiating pain    Baseline 70    Time 6    Period Weeks    Status New    Target Date 04/23/20                  Plan - 03/12/20 1113    Clinical Impression Statement Pt presents to clinic with reports of L buttocks pain with L radiating pain to foot for ~2-3 months, no known MOI. Pt demos flexibility deficits on LLE, tenderness L buttocks/piriformis, and reports difficulty with prologned standing/walking d/t pain. Pt would benefit from skilled PT to address the above impariments.    Personal Factors and Comorbidities Comorbidity 1    Comorbidities hx of stroke affecting R side    Examination-Activity Limitations Caring for Others;Lift;Sit;Sleep;Stand    Examination-Participation Restrictions Community Activity;Interpersonal Relationship    Stability/Clinical Decision Making Evolving/Moderate complexity    Clinical Decision Making Low    Rehab Potential Good    PT Frequency 1x / week   pt requests 1x/week d/t scheduling conflicts   PT Duration 6 weeks    PT Treatment/Interventions ADLs/Self Care Home Management;Cryotherapy;Electrical Stimulation;Iontophoresis 4mg /ml Dexamethasone;Moist Heat;Therapeutic exercise;Therapeutic activities;Patient/family education;Manual techniques;Passive range of motion;Dry needling    PT Next Visit Plan review/assess HEP, LE flexibility, manual/modalities as indicated    PT Home Exercise Plan hamstring stretch, piriformis stretch, SKTC, LTR    Consulted and Agree with Plan of Care Patient           Patient will benefit from skilled therapeutic intervention in order to improve the following deficits and impairments:  Abnormal gait, Decreased range of motion, Increased muscle spasms, Pain, Impaired flexibility, Decreased mobility, Postural dysfunction  Visit Diagnosis: Acute left-sided low back pain with left-sided sciatica  Cramp and  spasm  Difficulty in walking, not elsewhere classified     Problem List Patient Active Problem List   Diagnosis Date Noted  . Cough 12/31/2019  . 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 Elevated LDL cholesterol level 08/07/2019  . Type 2 diabetes mellitus with hyperglycemia, without long-term current use of insulin ([place]) 08/07/2019  . H/O cerebral artery stenosis 07/24/2019  . 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 Abnormality of gait 07/19/2019  . Right wrist drop   . Right foot drop   . Hypokalemia   . Stenosis of artery ([place])   . Benign essential HTN   . Stage 2 chronic kidney disease   . Prediabetes   . Transaminitis   . Hypoalbuminemia due to protein-calorie malnutrition ([place])   . Labile blood pressure   . Left pontine cerebrovascular accident ([place]) 06/20/2019  . Acute ischemic stroke ([place])   . Right hemiparesis ([place])   . Dysphagia, post-stroke   . 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 Stroke (cerebrum) ([place]) L pontine d/t BA stenosis 06/14/2019  . Subcutaneous nodule of  left foot 01/03/2019  . Atopic dermatitis 10/18/2018  . Seasonal allergies 08/09/2018  . 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 Osteoarthritis of carpometacarpal (CMC) joint of thumb 11/07/2017  . Acquired hypothyroidism 08/04/2015  . Essential hypertension 08/04/2015  . GERD without esophagitis 08/04/2015  . Mixed hyperlipidemia 08/04/2015  . Osteopenia 08/04/2015  . Primary insomnia 08/04/2015  . Pure hypertriglyceridemia 08/04/2015   Amador Cunas, PT, DPT [place] Prose Seana Underwood 03/12/2020, 11:27 AM  [address] [place], [place], [address] Phone: [phone]   Fax:  [phone]  Name: DELYLA SANDEEN MRN: [id number] Date of Birth: 31-Aug-1950

## 2020-03-12 NOTE — Patient Instructions (Signed)
Access Code: [email or web address] URL: [name] Date: 03/12/2020 Prepared by: Amador Cunas  Exercises Supine Lower Trunk Rotation - 1 x daily - 7 x weekly - 3 sets - 10 reps - 5-10 sec hold Supine Single Knee to Chest Stretch - 1 x daily - 7 x weekly - 3 sets - 5 reps - 5-10 sec hold Seated Table Hamstring Stretch - 1 x daily - 7 x weekly - 3 sets - 2 reps - 20-30 sec hold Seated Piriformis Stretch - 1 x daily - 7 x weekly - 3 sets - 2 reps - 20-30 sec hold Seated Piriformis Stretch - 1 x daily - 7 x weekly - 3 sets - 2 reps - 20-30 sec hold

## 2020-03-14 ENCOUNTER — Ambulatory Visit (INDEPENDENT_AMBULATORY_CARE_PROVIDER_SITE_OTHER): Payer: PPO | Admitting: Family Medicine

## 2020-03-14 ENCOUNTER — Other Ambulatory Visit: Payer: Self-pay

## 2020-03-14 ENCOUNTER — Encounter: Payer: Self-pay | Admitting: Family Medicine

## 2020-03-14 VITALS — BP 136/70 | HR 55 | Temp 97.3°F | Ht 64.0 in | Wt 145.8 lb

## 2020-03-14 DIAGNOSIS — M5432 Sciatica, left side: Secondary | ICD-10-CM

## 2020-03-14 DIAGNOSIS — I10 Essential (primary) hypertension: Secondary | ICD-10-CM

## 2020-03-14 MED ORDER — AMLODIPINE BESYLATE 10 MG PO TABS: 10.0000 mg | ORAL_TABLET | Freq: Every day | ORAL | 1 refills | Status: DC

## 2020-03-14 MED ORDER — GABAPENTIN 100 MG PO CAPS: ORAL_CAPSULE | ORAL | 1 refills | Status: DC

## 2020-03-14 NOTE — Progress Notes (Signed)
Established Patient Office Visit  Subjective:  Patient ID: Megan Martinez, female    DOB: 1950-12-01  Age: 69 y.o. MRN: [id number]  CC:  Chief Complaint  Patient presents with  . Follow-up    1 month follow up, no concerns.     HPI Megan Martinez presents for follow-up of hypertension and sciatica.  Blood pressure is controlled on 0.2 of clonidine twice daily and amlodipine.  Megan Martinez is no longer taking chlorthalidone.  Megan Martinez had continued potassium and will stop that today.  Rehab physical therapist is working with Megan Martinez but sciatica.  Megan Martinez is doing stretching exercises and it is slowly improving.  Still bothers Megan Martinez some at night.  And it is somewhat relieved when Megan Martinez takes 1 Neurontin and Tylenol.  Past Medical History:  Diagnosis Date  . Allergy   . Arthritis   . GERD (gastroesophageal reflux disease)   . Hypertension   . Hypothyroidism   . Pneumonia   . Stroke [facility])    right arm is "not working", speech a little slurred  . Thyroid disease     Past Surgical History:  Procedure Laterality Date  . ABDOMINAL HYSTERECTOMY    . APPENDECTOMY    . BREAST SURGERY Right    lumpectomy  . CATARACT EXTRACTION    . CHOLECYSTECTOMY    . IR ANGIO INTRA EXTRACRAN SEL COM CAROTID INNOMINATE BILAT MOD SED  07/24/2019  . IR ANGIO VERTEBRAL SEL SUBCLAVIAN INNOMINATE UNI L MOD SED  07/24/2019  . IR INTRA CRAN STENT  07/24/2019  . RADIOLOGY WITH ANESTHESIA N/A 07/09/2019   Procedure: RADIOLOGY WITH ANESTHESIA STENT PLACEMENT;  Surgeon: Luanne Bras, MD;  Location: [place];  Service: Radiology;  Laterality: N/A;  . RADIOLOGY WITH ANESTHESIA N/A 07/23/2019   Procedure: RADIOLOGY WITH ANESTHESIA STENT PLACEMENT;  Surgeon: Luanne Bras, MD;  Location: [place];  Service: Radiology;  Laterality: N/A;  . RADIOLOGY WITH ANESTHESIA N/A 07/24/2019   Procedure: RADIOLOGY WITH ANESTHESIA STENT PLACEMENT;  Surgeon: Luanne Bras, MD;  Location: [place];  Service: Radiology;  Laterality: N/A;  . TUBAL  LIGATION      History reviewed. No pertinent family history.  Social History   Socioeconomic History  . Marital status: Married    Spouse name: Not on file  . Number of children: Not on file  . Years of education: Not on file  . Highest education level: Not on file  Occupational History  . Not on file  Tobacco Use  . Smoking status: Former Smoker    Quit date: 1970    Years since quitting: 51.8  . Smokeless tobacco: Never Used  Vaping Use  . Vaping Use: Never used  Substance and Sexual Activity  . Alcohol use: Yes    Comment: occasional  . Drug use: Never  . Sexual activity: Not on file  Other Topics Concern  . Not on file  Social History Narrative  . Not on file   Social Determinants of Health   Financial Resource Strain:   . Difficulty of Paying Living Expenses: Not on file  Food Insecurity:   . Worried About Charity fundraiser in the Last Year: Not on file  . Ran Out of Food in the Last Year: Not on file  Transportation Needs:   . Lack of Transportation (Medical): Not on file  . Lack of Transportation (Non-Medical): Not on file  Physical Activity:   . Days of Exercise per Week: Not on file  . Minutes of Exercise  per Session: Not on file  Stress:   . Feeling of Stress : Not on file  Social Connections:   . Frequency of Communication with Friends and Family: Not on file  . Frequency of Social Gatherings with Friends and Family: Not on file  . Attends Religious Services: Not on file  . Active Member of Clubs or Organizations: Not on file  . Attends Archivist Meetings: Not on file  . Marital Status: Not on file  Intimate Partner Violence:   . Fear of Current or Ex-Partner: Not on file  . Emotionally Abused: Not on file  . Physically Abused: Not on file  . Sexually Abused: Not on file    Outpatient Medications Prior to Visit  Medication Sig Dispense Refill  . acetaminophen (TYLENOL) 325 MG tablet Take 2 tablets (650 mg total) by mouth every 4  (four) hours as needed for mild pain (or temp > 37.5 C (99.5 F)).    Marland Kitchen aspirin EC 325 MG EC tablet Take 1 tablet (325 mg total) by mouth daily. 30 tablet 0  . atorvastatin (LIPITOR) 80 MG tablet TAKE 1 TABLET(80 MG) BY MOUTH DAILY AT 6 PM 90 tablet 1  . benzonatate (TESSALON) 100 MG capsule Take 1 capsule (100 mg total) by mouth 3 (three) times daily as needed for cough. 20 capsule 0  . cloNIDine (CATAPRES) 0.2 MG tablet Take 1 tablet (0.2 mg total) by mouth 2 (two) times daily. 60 tablet 2  . clopidogrel (PLAVIX) 75 MG tablet Take 1 tablet (75 mg total) by mouth daily. 30 tablet 1  . levocetirizine (XYZAL) 5 MG tablet TAKE 1 TABLET(5 MG) BY MOUTH EVERY EVENING 90 tablet 3  . levothyroxine (SYNTHROID) 137 MCG tablet TAKE 1 TABLET(137 MCG) BY MOUTH DAILY AT 6 AM 90 tablet 0  . pantoprazole (PROTONIX) 40 MG tablet TAKE 1 TABLET(40 MG) BY MOUTH AT BEDTIME 90 tablet 0  . potassium chloride SA (KLOR-CON) 20 MEQ tablet Take 2 tablets (40 mEq total) by mouth daily. 10 tablet 0  . fluticasone (FLONASE) 50 MCG/ACT nasal spray Place 2 sprays into both nostrils daily. (Patient not taking: Reported on 03/14/2020) 16 g 6  . amLODipine (NORVASC) 10 MG tablet Take 1 tablet (10 mg total) by mouth daily. (Patient not taking: Reported on 03/14/2020) 90 tablet 1  . gabapentin (NEURONTIN) 100 MG capsule Take one at night for a week and then increase to one twice daily. 60 capsule 1   Facility-Administered Medications Prior to Visit  Medication Dose Route Frequency Provider Last Rate Last Admin  . midazolam (VERSED) 5 MG/5ML injection   Intravenous Anesthesia Intra-op Mariea Clonts, CRNA   1 mg at 07/09/19 0845    Allergies  Allergen Reactions  . Latex Itching and Rash  . Statins Other (See Comments)    Myalgias, "sick"   . Amoxicillin Rash    Did it involve swelling of the face/tongue/throat, SOB, or low BP? No Did it involve sudden or severe rash/hives, skin peeling, or any reaction on the inside of  your mouth or nose? Yes Did you need to seek medical attention at a hospital or doctor's office? N/A When did it last happen? Child If all above answers are "NO", may proceed with cephalosporin use.  . Codeine Nausea Only  . Penicillin G Rash    Did it involve swelling of the face/tongue/throat, SOB, or low BP? No Did it involve sudden or severe rash/hives, skin peeling, or any reaction on the inside of  your mouth or nose? Yes Did you need to seek medical attention at a hospital or doctor's office? N/A When did it last happen?Child If all above answers are "NO", may proceed with cephalosporin use.    ROS Review of Systems  Constitutional: Negative.   Respiratory: Negative.   Cardiovascular: Negative.   Gastrointestinal: Negative.   Skin: Negative for pallor and rash.  Neurological: Negative for headaches.  Hematological: Does not bruise/bleed easily.  Psychiatric/Behavioral: Negative.       Objective:    Physical Exam Vitals and nursing note reviewed.  Constitutional:      General: Megan Martinez is not in acute distress.    Appearance: Normal appearance. Megan Martinez is not ill-appearing or toxic-appearing.  HENT:     Head: Normocephalic and atraumatic.     Right Ear: External ear normal.     Left Ear: External ear normal.  Eyes:     General: No scleral icterus.       Right eye: No discharge.        Left eye: No discharge.     Conjunctiva/sclera: Conjunctivae normal.  Cardiovascular:     Rate and Rhythm: Normal rate and regular rhythm.  Pulmonary:     Effort: Pulmonary effort is normal.     Breath sounds: Normal breath sounds.  Musculoskeletal:     Cervical back: No rigidity or tenderness.       Legs:  Lymphadenopathy:     Cervical: No cervical adenopathy.  Skin:    General: Skin is warm and dry.  Neurological:     Mental Status: Megan Martinez is alert and oriented to person, place, and time.  Psychiatric:        Mood and Affect: Mood normal.        Behavior: Behavior normal.      BP 136/70   Pulse (!) 55   Temp (!) 97.3 F (36.3 C) (Tympanic)   Ht 5\' 4"  (1.626 m)   Wt 145 lb 12.8 oz (66.1 kg)   SpO2 96%   BMI 25.03 kg/m  Wt Readings from Last 3 Encounters:  03/14/20 145 lb 12.8 oz (66.1 kg)  02/13/20 144 lb 9.6 oz (65.6 kg)  02/07/20 144 lb (65.3 kg)     Health Maintenance Due  Topic Date Due  . Hepatitis C Screening  Never done  . FOOT EXAM  Never done  . OPHTHALMOLOGY EXAM  Never done  . TETANUS/TDAP  Never done  . MAMMOGRAM  Never done  . COLONOSCOPY  Never done  . DEXA SCAN  Never done  . INFLUENZA VACCINE  12/23/2019    There are no preventive care reminders to display for this patient.  Lab Results  Component Value Date   TSH 1.43 11/21/2019   Lab Results  Component Value Date   WBC 6.8 11/21/2019   HGB 13.8 11/21/2019   HCT 39.3 11/21/2019   MCV 84.1 11/21/2019   PLT 330.0 11/21/2019   Lab Results  Component Value Date   NA 137 02/13/2020   K 3.9 02/13/2020   CO2 37 (H) 02/13/2020   GLUCOSE 168 (H) 02/13/2020   BUN 10 02/13/2020   CREATININE 0.71 02/13/2020   BILITOT 0.7 11/21/2019   ALKPHOS 46 11/21/2019   AST 13 11/21/2019   ALT 13 11/21/2019   PROT 6.9 11/21/2019   ALBUMIN 4.5 11/21/2019   CALCIUM 10.2 02/13/2020   ANIONGAP 9 07/25/2019   GFR 81.49 02/13/2020   Lab Results  Component Value Date   CHOL 143 11/21/2019  Lab Results  Component Value Date   HDL 44.30 11/21/2019   Lab Results  Component Value Date   LDLCALC 71 11/21/2019   Lab Results  Component Value Date   TRIG 136.0 11/21/2019   Lab Results  Component Value Date   CHOLHDL 3 11/21/2019   Lab Results  Component Value Date   HGBA1C 5.8 11/21/2019      Assessment & Plan:   Problem List Items Addressed This Visit      Cardiovascular and Mediastinum   Essential hypertension   Relevant Medications   amLODipine (NORVASC) 10 MG tablet     Nervous and Auditory   Sciatica of left side - Primary   Relevant Medications    gabapentin (NEURONTIN) 100 MG capsule      Meds ordered this encounter  Medications  . gabapentin (NEURONTIN) 100 MG capsule    Sig: Take 2 at night.    Dispense:  60 capsule    Refill:  1  . amLODipine (NORVASC) 10 MG tablet    Sig: Take 1 tablet (10 mg total) by mouth daily.    Dispense:  90 tablet    Refill:  1    Follow-up: Return in about 3 months (around 06/14/2020), or Continue clonidine and amlodipine for blood pressure.  Stop potassium..   Continue clonidine twice daily and amlodipine for blood pressure.  Stop potassium.  Have increased Neurontin to 200 mg nightly.  Continue Tylenol as well.  Follow-up in 3 months. Libby Maw, MD

## 2020-03-20 ENCOUNTER — Encounter: Payer: PPO | Admitting: Physical Medicine & Rehabilitation

## 2020-03-21 ENCOUNTER — Encounter: Payer: Self-pay | Admitting: Physical Therapy

## 2020-03-21 ENCOUNTER — Other Ambulatory Visit: Payer: Self-pay

## 2020-03-21 ENCOUNTER — Ambulatory Visit: Payer: PPO | Admitting: Physical Therapy

## 2020-03-21 DIAGNOSIS — M5442 Lumbago with sciatica, left side: Secondary | ICD-10-CM | POA: Diagnosis not present

## 2020-03-21 DIAGNOSIS — R262 Difficulty in walking, not elsewhere classified: Secondary | ICD-10-CM

## 2020-03-21 NOTE — Therapy (Signed)
[place]. [place], [place], [address] Phone: [phone]   Fax:  [phone]  Physical Therapy Treatment  Patient Details  Name: Megan Martinez MRN: [id number] Date of Birth: 10-31-1950 Referring Provider (PT): Nche   Encounter Date: 03/21/2020   PT End of Session - 03/21/20 1042    Visit Number 2    Date for PT Re-Evaluation 05/12/20    PT Start Time 2353    PT Stop Time 1052    PT Time Calculation (min) 50 min    Activity Tolerance Patient tolerated treatment well    Behavior During Therapy [facility] for tasks assessed/performed           Past Medical History:  Diagnosis Date  . Allergy   . Arthritis   . GERD (gastroesophageal reflux disease)   . Hypertension   . Hypothyroidism   . Pneumonia   . Stroke [facility]    right arm is "not working", speech a little slurred  . Thyroid disease     Past Surgical History:  Procedure Laterality Date  . ABDOMINAL HYSTERECTOMY    . APPENDECTOMY    . BREAST SURGERY Right    lumpectomy  . CATARACT EXTRACTION    . CHOLECYSTECTOMY    . IR ANGIO INTRA EXTRACRAN SEL COM CAROTID INNOMINATE BILAT MOD SED  07/24/2019  . IR ANGIO VERTEBRAL SEL SUBCLAVIAN INNOMINATE UNI L MOD SED  07/24/2019  . IR INTRA CRAN STENT  07/24/2019  . RADIOLOGY WITH ANESTHESIA N/A 07/09/2019   Procedure: RADIOLOGY WITH ANESTHESIA STENT PLACEMENT;  Surgeon: Luanne Bras, MD;  Location: [place];  Service: Radiology;  Laterality: N/A;  . RADIOLOGY WITH ANESTHESIA N/A 07/23/2019   Procedure: RADIOLOGY WITH ANESTHESIA STENT PLACEMENT;  Surgeon: [place]anne Bras, MD;  Location: [place];  Service: Radiology;  Laterality: N/A;  . RADIOLOGY WITH ANESTHESIA N/A 07/24/2019   Procedure: RADIOLOGY WITH ANESTHESIA STENT PLACEMENT;  Surgeon: [place]anne Bras, MD;  Location: [place];  Service: Radiology;  Laterality: N/A;  . TUBAL LIGATION      There were no vitals filed for this visit.   Subjective Assessment -  03/21/20 1001    Subjective Patient reports to PT today with 7/10 in her L leg. Commented that the medication the MD gave her only works half way through the night and the remainder of the night she remains awake because of the shooting pain down her L leg.    Currently in Pain? Yes    Pain Score 7                              [facility] Adult PT Treatment/Exercise - 03/21/20 0001      Lumbar Exercises: Stretches   Active Hamstring Stretch 3 reps;20 seconds    Single Knee to Chest Stretch 3 reps;20 seconds    Lower Trunk Rotation 5 reps;10 seconds    Piriformis Stretch Left;3 reps;20 seconds   push and pull    Gastroc Stretch 3 reps;20 seconds    Other Lumbar Stretch Exercise Hamstring floss 2x30sec       Lumbar Exercises: Aerobic   Nustep L4 52mins      Lumbar Exercises: Supine   Clam 20 reps;2 seconds    Bridge with [employer] 20 reps      Modalities   Modalities Moist Heat   Low back      Moist Heat Therapy   Number Minutes Moist  Heat 10 Minutes    Moist Heat Location Lumbar Spine                    PT Short Term Goals - 03/12/20 1119      PT SHORT TERM GOAL #1   Title Independent with initial HEP    Time 2    Period Weeks    Status New    Target Date 03/26/20             PT Long Term Goals - 03/12/20 1119      PT LONG TERM GOAL #1   Title Pt will report resolution of radiating pain in LLE    Time 6    Period Weeks    Status New    Target Date 04/23/20      PT LONG TERM GOAL #2   Title Pt will report 50% reduction in pain    Time 6    Period Weeks    Status New    Target Date 04/23/20      PT LONG TERM GOAL #3   Title Pt will demo L hamstring flexibility to 90 deg with no increase in radiating pain    Baseline 70    Time 6    Period Weeks    Status New    Target Date 04/23/20                 Plan - 03/21/20 1043    Clinical Impression Statement Presents to clinic today with radiating pain that starts in her  buttocks and travels down into her LE. She ambulated in with a waddeling antalgic gait due to an eventful morning at home and sciatica pain. She did well with all exercises and would benefit from continued PT in order to continue to address deficits and to decease pain in L leg during ADL's    PT Treatment/Interventions ADLs/Self Care Home Management;Cryotherapy;Electrical Stimulation;Iontophoresis 4mg /ml Dexamethasone;Moist Heat;Therapeutic exercise;Therapeutic activities;Patient/family education;Manual techniques;Passive range of motion;Dry needling    PT Next Visit Plan Heat relieves pain for patient in L buttock.           Patient will benefit from skilled therapeutic intervention in order to improve the following deficits and impairments:  Abnormal gait, Decreased range of motion, Increased muscle spasms, Pain, Impaired flexibility, Decreased mobility, Postural dysfunction  Visit Diagnosis: Acute left-sided low back pain with left-sided sciatica  Difficulty in walking, not elsewhere classified     Problem List Patient Active Problem List   Diagnosis Date Noted  . Sciatica of left side 03/14/2020  . Cough 12/31/2019  . 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 Elevated LDL cholesterol level 08/07/2019  . Type 2 diabetes mellitus with hyperglycemia, without long-term current use of insulin ([place]) 08/07/2019  . H/O cerebral artery stenosis 07/24/2019  . 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 Abnormality of gait 07/19/2019  . Right wrist drop   . Right foot drop   . Hypokalemia   . Stenosis of artery ([place])   . Benign essential HTN   . Stage 2 chronic kidney disease   . Prediabetes   . Transaminitis   . Hypoalbuminemia due to protein-calorie malnutrition ([place])   . Labile blood pressure   . Left pontine cerebrovascular accident ([place]) 06/20/2019  . Acute ischemic stroke ([place])   . Right  hemiparesis ([place])   . Dysphagia, post-stroke   . Basilar artery stenosis  with infarction [facility]) s/p tPA 06/19/2019  . Stroke (cerebrum) ([place]) L pontine d/t BA stenosis 06/14/2019  . Subcutaneous nodule of left foot 01/03/2019  . Atopic dermatitis 10/18/2018  . Seasonal allergies 08/09/2018  . 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 Osteoarthritis of carpometacarpal (CMC) joint of thumb 11/07/2017  . Acquired hypothyroidism 08/04/2015  . Essential hypertension 08/04/2015  . GERD without esophagitis 08/04/2015  . Mixed hyperlipidemia 08/04/2015  . Osteopenia 08/04/2015  . Primary insomnia 08/04/2015  . Pure hypertriglyceridemia 08/04/2015    Lavenia Atlas, SPTA 03/21/2020, 10:48 AM  [place]. [place], [place], [address] Phone: [phone]   Fax:  [phone]  Name: Megan Martinez MRN: [id number] Date of Birth: 17-Oct-1950

## 2020-03-22 ENCOUNTER — Other Ambulatory Visit: Payer: Self-pay | Admitting: Family Medicine

## 2020-03-24 ENCOUNTER — Other Ambulatory Visit: Payer: Self-pay | Admitting: Family Medicine

## 2020-03-24 DIAGNOSIS — E039 Hypothyroidism, unspecified: Secondary | ICD-10-CM

## 2020-03-24 NOTE — Telephone Encounter (Signed)
Last OV 03/14/20 Last fill 12/31/19  #90/0

## 2020-03-28 ENCOUNTER — Ambulatory Visit: Payer: PPO | Attending: Nurse Practitioner | Admitting: Physical Therapy

## 2020-03-28 ENCOUNTER — Other Ambulatory Visit: Payer: Self-pay

## 2020-03-28 DIAGNOSIS — R262 Difficulty in walking, not elsewhere classified: Secondary | ICD-10-CM

## 2020-03-28 DIAGNOSIS — R252 Cramp and spasm: Secondary | ICD-10-CM | POA: Insufficient documentation

## 2020-03-28 DIAGNOSIS — M5442 Lumbago with sciatica, left side: Secondary | ICD-10-CM | POA: Diagnosis not present

## 2020-03-28 NOTE — Therapy (Signed)
[place]. [place], [place], [address] Phone: [phone]   Fax:  [phone]  Physical Therapy Treatment  Patient Details  Name: Megan Martinez MRN: [id number] Date of Birth: 13-Sep-1950 Referring Provider (PT): Nche   Encounter Date: 03/28/2020   PT End of Session - 03/28/20 1036    Visit Number 3    Date for PT Re-Evaluation 05/12/20    PT Start Time 1010    PT Stop Time 1040    PT Time Calculation (min) 30 min           Past Medical History:  Diagnosis Date  . Allergy   . Arthritis   . GERD (gastroesophageal reflux disease)   . Hypertension   . Hypothyroidism   . Pneumonia   . Stroke [facility])    right arm is "not working", speech a little slurred  . Thyroid disease     Past Surgical History:  Procedure Laterality Date  . ABDOMINAL HYSTERECTOMY    . APPENDECTOMY    . BREAST SURGERY Right    lumpectomy  . CATARACT EXTRACTION    . CHOLECYSTECTOMY    . IR ANGIO INTRA EXTRACRAN SEL COM CAROTID INNOMINATE BILAT MOD SED  07/24/2019  . IR ANGIO VERTEBRAL SEL SUBCLAVIAN INNOMINATE UNI L MOD SED  07/24/2019  . IR INTRA CRAN STENT  07/24/2019  . RADIOLOGY WITH ANESTHESIA N/A 07/09/2019   Procedure: RADIOLOGY WITH ANESTHESIA STENT PLACEMENT;  Surgeon: [place]anne Bras, MD;  Location: [place];  Service: Radiology;  Laterality: N/A;  . RADIOLOGY WITH ANESTHESIA N/A 07/23/2019   Procedure: RADIOLOGY WITH ANESTHESIA STENT PLACEMENT;  Surgeon: [place]anne Bras, MD;  Location: [place];  Service: Radiology;  Laterality: N/A;  . RADIOLOGY WITH ANESTHESIA N/A 07/24/2019   Procedure: RADIOLOGY WITH ANESTHESIA STENT PLACEMENT;  Surgeon: [place]anne Bras, MD;  Location: [place];  Service: Radiology;  Laterality: N/A;  . TUBAL LIGATION      There were no vitals filed for this visit.   Subjective Assessment - 03/28/20 1007    Subjective some better a few hours after session, nights are the worse. also have a 69 y.o I am having  trouble keeping up with    Currently in Pain? Yes    Pain Score 7     Pain Location Buttocks    Pain Orientation Left                             [facility] Adult PT Treatment/Exercise - 03/28/20 0001      Modalities   Modalities Traction      Traction   Type of Traction Lumbar    Min (lbs) 40    Max (lbs) 50    Hold Time 60    Rest Time 20    Time 15      Manual Therapy   Manual Therapy Passive ROM    Passive ROM LE and trunk                    PT Short Term Goals - 03/28/20 1036      PT SHORT TERM GOAL #1   Title Independent with initial HEP    Baseline for stretching    Status Achieved             PT Long Term Goals - 03/12/20 1119      PT LONG TERM GOAL #1   Title Pt will  report resolution of radiating pain in LLE    Time 6    Period Weeks    Status New    Target Date 04/23/20      PT LONG TERM GOAL #2   Title Pt will report 50% reduction in pain    Time 6    Period Weeks    Status New    Target Date 04/23/20      PT LONG TERM GOAL #3   Title Pt will demo L hamstring flexibility to 90 deg with no increase in radiating pain    Baseline 70    Time 6    Period Weeks    Status New    Target Date 04/23/20                 Plan - 03/28/20 1036    Clinical Impression Statement STG met, pt able to verb adn dem stretching-cued to keep knee straight with HS stretching. pt verb no changes with PT and radiating pain worse at night so trial of mech traction with PROM/stretching today. when pt left no pain. Left LE tighter than RT    PT Treatment/Interventions ADLs/Self Care Home Management;Cryotherapy;Electrical Stimulation;Iontophoresis 103m/ml Dexamethasone;Moist Heat;Therapeutic exercise;Therapeutic activities;Patient/family education;Manual techniques;Passive range of motion;Dry needling    PT Next Visit Plan assess if traction helped.add core stab HEP           Patient will benefit from skilled therapeutic intervention  in order to improve the following deficits and impairments:  Abnormal gait, Decreased range of motion, Increased muscle spasms, Pain, Impaired flexibility, Decreased mobility, Postural dysfunction  Visit Diagnosis: Acute left-sided low back pain with left-sided sciatica  Difficulty in walking, not elsewhere classified     Problem List Patient Active Problem List   Diagnosis Date Noted  . Sciatica of left side 03/14/2020  . Cough 12/31/2019  . 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 Elevated LDL cholesterol level 08/07/2019  . Type 2 diabetes mellitus with hyperglycemia, without long-term current use of insulin ([place] 08/07/2019  . H/O cerebral artery stenosis 07/24/2019  . 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 Abnormality of gait 07/19/2019  . Right wrist drop   . Right foot drop   . Hypokalemia   . Stenosis of artery ([place]   . Benign essential HTN   . Stage 2 chronic kidney disease   . Prediabetes   . Transaminitis   . Hypoalbuminemia due to protein-calorie malnutrition ([place]   . Labile blood pressure   . Left pontine cerebrovascular accident ([place] 06/20/2019  . Acute ischemic stroke ([place]   . Right hemiparesis ([place]   . Dysphagia, post-stroke   . Basilar artery stenosis with infarction ([facility] s/p tPA 06/19/2019  . Stroke (cerebrum) ([place] L pontine d/t BA stenosis 06/14/2019  . Subcutaneous nodule of left foot 01/03/2019  . Atopic dermatitis 10/18/2018  . Seasonal allergies 08/09/2018  . 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 Osteoarthritis of carpometacarpal (CMC) joint of thumb 11/07/2017  . Acquired hypothyroidism 08/04/2015  . Essential hypertension 08/04/2015  . GERD without esophagitis 08/04/2015  . Mixed hyperlipidemia 08/04/2015  . Osteopenia 08/04/2015  . Primary insomnia 08/04/2015  . Pure hypertriglyceridemia 08/04/2015     Delron Comer,ANGIE PTA 03/28/2020, 10:39 AM  [place] [address]Phone: [phone]  Fax:  [phone] Name: Megan NICKOLSONMRN: [id number]  Date of Birth: 1950-10-18

## 2020-04-04 ENCOUNTER — Encounter: Payer: PPO | Admitting: Physical Therapy

## 2020-04-09 DIAGNOSIS — I69351 Hemiplegia and hemiparesis following cerebral infarction affecting right dominant side: Secondary | ICD-10-CM | POA: Diagnosis not present

## 2020-04-10 ENCOUNTER — Encounter: Payer: Self-pay | Admitting: Physical Therapy

## 2020-04-10 ENCOUNTER — Ambulatory Visit: Payer: PPO | Admitting: Physical Therapy

## 2020-04-10 ENCOUNTER — Other Ambulatory Visit: Payer: Self-pay

## 2020-04-10 DIAGNOSIS — R252 Cramp and spasm: Secondary | ICD-10-CM

## 2020-04-10 DIAGNOSIS — R262 Difficulty in walking, not elsewhere classified: Secondary | ICD-10-CM

## 2020-04-10 DIAGNOSIS — M5442 Lumbago with sciatica, left side: Secondary | ICD-10-CM | POA: Diagnosis not present

## 2020-04-10 NOTE — Therapy (Signed)
[place]. [place], [place], [address] Phone: [phone]   Fax:  [phone]  Physical Therapy Treatment  Patient Details  Name: Megan Martinez MRN: [id number] Date of Birth: 07-03-1950 Referring Provider (PT): Nche   Encounter Date: 04/10/2020   PT End of Session - 04/10/20 1057    Visit Number 4    Date for PT Re-Evaluation 05/12/20    PT Start Time 0814    PT Stop Time 1100    PT Time Calculation (min) 45 min    Activity Tolerance Patient tolerated treatment well    Behavior During Therapy [facility] for tasks assessed/performed           Past Medical History:  Diagnosis Date  . Allergy   . Arthritis   . GERD (gastroesophageal reflux disease)   . Hypertension   . Hypothyroidism   . Pneumonia   . Stroke [facility])    right arm is "not working", speech a little slurred  . Thyroid disease     Past Surgical History:  Procedure Laterality Date  . ABDOMINAL HYSTERECTOMY    . APPENDECTOMY    . BREAST SURGERY Right    lumpectomy  . CATARACT EXTRACTION    . CHOLECYSTECTOMY    . IR ANGIO INTRA EXTRACRAN SEL COM CAROTID INNOMINATE BILAT MOD SED  07/24/2019  . IR ANGIO VERTEBRAL SEL SUBCLAVIAN INNOMINATE UNI L MOD SED  07/24/2019  . IR INTRA CRAN STENT  07/24/2019  . RADIOLOGY WITH ANESTHESIA N/A 07/09/2019   Procedure: RADIOLOGY WITH ANESTHESIA STENT PLACEMENT;  Surgeon: Luanne Bras, MD;  Location: [place];  Service: Radiology;  Laterality: N/A;  . RADIOLOGY WITH ANESTHESIA N/A 07/23/2019   Procedure: RADIOLOGY WITH ANESTHESIA STENT PLACEMENT;  Surgeon: Luanne Bras, MD;  Location: [place];  Service: Radiology;  Laterality: N/A;  . RADIOLOGY WITH ANESTHESIA N/A 07/24/2019   Procedure: RADIOLOGY WITH ANESTHESIA STENT PLACEMENT;  Surgeon: Luanne Bras, MD;  Location: [place];  Service: Radiology;  Laterality: N/A;  . TUBAL LIGATION      There were no vitals filed for this visit.   Subjective Assessment -  04/10/20 1014    Subjective Pt reports that she is still experiencing significant radiating pain; states that it was much better for a few days after traction last rx.    Currently in Pain? Yes    Pain Score 7     Pain Location Buttocks    Pain Orientation Left                             [facility] Adult PT Treatment/Exercise - 04/10/20 0001      Lumbar Exercises: Stretches   Passive Hamstring Stretch Left;4 reps;20 seconds    Single Knee to Chest Stretch Left;4 reps;30 seconds    Lower Trunk Rotation 5 reps;10 seconds    Piriformis Stretch Left;4 reps;20 seconds      Lumbar Exercises: Aerobic   Nustep L4 18mins      Lumbar Exercises: Supine   Clam 10 reps    Clam Limitations green TB    Bridge with [employer] 20 reps;Compliant;3 seconds      Traction   Type of Traction Lumbar    Min (lbs) 40    Max (lbs) 50    Hold Time 60    Rest Time 20    Time 15      Manual Therapy   Manual Therapy Passive  ROM    Passive ROM PROM to L hip all directions                    PT Short Term Goals - 03/28/20 1036      PT SHORT TERM GOAL #1   Title Independent with initial HEP    Baseline for stretching    Status Achieved             PT Long Term Goals - 04/10/20 1106      PT LONG TERM GOAL #1   Title Pt will report resolution of radiating pain in LLE    Time 6    Period Weeks    Status On-going      PT LONG TERM GOAL #2   Title Pt will report 50% reduction in pain    Time 6    Period Weeks    Status On-going      PT LONG TERM GOAL #3   Title Pt will demo L hamstring flexibility to 90 deg with no increase in radiating pain    Baseline 70    Time 6    Period Weeks    Status On-going                 Plan - 04/10/20 1058    Clinical Impression Statement Pt did well with stretching and supine core/LE strength/flexibility ex's today. Moderate pain reported with L hamstring stretching. Cues for form with supine bridges and active  stretching. Assess response to traction next rx.    PT Treatment/Interventions ADLs/Self Care Home Management;Cryotherapy;Electrical Stimulation;Iontophoresis 4mg /ml Dexamethasone;Moist Heat;Therapeutic exercise;Therapeutic activities;Patient/family education;Manual techniques;Passive range of motion;Dry needling    PT Next Visit Plan traction if indicated; add core stab HEP    Consulted and Agree with Plan of Care Patient           Patient will benefit from skilled therapeutic intervention in order to improve the following deficits and impairments:  Abnormal gait, Decreased range of motion, Increased muscle spasms, Pain, Impaired flexibility, Decreased mobility, Postural dysfunction  Visit Diagnosis: Acute left-sided low back pain with left-sided sciatica  Difficulty in walking, not elsewhere classified  Cramp and spasm     Problem List Patient Active Problem List   Diagnosis Date Noted  . Sciatica of left side 03/14/2020  . Cough 12/31/2019  . 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 Elevated LDL cholesterol level 08/07/2019  . Type 2 diabetes mellitus with hyperglycemia, without long-term current use of insulin ([place]) 08/07/2019  . H/O cerebral artery stenosis 07/24/2019  . 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 Abnormality of gait 07/19/2019  . Right wrist drop   . Right foot drop   . Hypokalemia   . Stenosis of artery ([place])   . Benign essential HTN   . Stage 2 chronic kidney disease   . Prediabetes   . Transaminitis   . Hypoalbuminemia due to protein-calorie malnutrition ([place])   . Labile blood pressure   . Left pontine cerebrovascular accident ([place]) 06/20/2019  . Acute ischemic stroke ([place])   . Right hemiparesis ([place])   . Dysphagia, post-stroke   . Basilar artery stenosis with infarction [facility]) s/p tPA 06/19/2019  . Stroke (cerebrum) ([place]) L pontine d/t BA stenosis 06/14/2019   . Subcutaneous nodule of left foot 01/03/2019  . Atopic dermatitis 10/18/2018  . Seasonal allergies 08/09/2018  . 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 Intraductal papilloma  01/12/2018  . Osteoarthritis of carpometacarpal (CMC) joint of thumb 11/07/2017  . Acquired hypothyroidism 08/04/2015  . Essential hypertension 08/04/2015  . GERD without esophagitis 08/04/2015  . Mixed hyperlipidemia 08/04/2015  . Osteopenia 08/04/2015  . Primary insomnia 08/04/2015  . Pure hypertriglyceridemia 08/04/2015   Amador Cunas, PT, DPT Donald Prose Sultan Pargas 04/10/2020, 11:07 AM  [place]. [place], [place], [address] Phone: [phone]   Fax:  [phone]  Name: HELAINE YACKEL MRN: [id number] Date of Birth: [birth year]

## 2020-04-14 ENCOUNTER — Other Ambulatory Visit: Payer: Self-pay | Admitting: Family Medicine

## 2020-04-14 DIAGNOSIS — M5432 Sciatica, left side: Secondary | ICD-10-CM

## 2020-04-21 ENCOUNTER — Telehealth: Payer: Self-pay | Admitting: Family Medicine

## 2020-04-21 NOTE — Telephone Encounter (Signed)
Spoke to patient and she states that Gabapentin is not helping even though she is taking 2 tabs at bedtime (only getting about 2-3 hours sleep). Advised to schedule an appt but unable to come to an early appt and grandson is sick so couldn't come till later in the week.  No appt's available, offered one for next week and she said she would just go to an urgent care if she needs to or call us back.  Dm/cma

## 2020-04-21 NOTE — Telephone Encounter (Signed)
lft VM to rtn call. Dm/cma  

## 2020-04-21 NOTE — Telephone Encounter (Signed)
lft VM to rtn call.  Going straight to VM. Dm/cma

## 2020-04-21 NOTE — Telephone Encounter (Signed)
Patient states that she was prescribed something for her sciatic nerve, but it's not working. She states that she has not been able to sleep and wants to know if she can be prescribed something else. Please give her a call back at [phone].

## 2020-04-22 ENCOUNTER — Ambulatory Visit: Payer: PPO | Admitting: Physical Therapy

## 2020-04-22 ENCOUNTER — Telehealth (HOSPITAL_COMMUNITY): Payer: Self-pay | Admitting: Radiology

## 2020-04-22 NOTE — Telephone Encounter (Signed)
Called pt to see if she wanted to reschedule her f/u CTA head/neck for a third time. She states that she has her 69 year old great- grandchild in her custody at this time and cannot come to appointments. She would like for [place] to call her back. Child is supposed to return to the home in Feb. 2022. JM

## 2020-04-29 ENCOUNTER — Other Ambulatory Visit: Payer: Self-pay

## 2020-04-29 ENCOUNTER — Encounter: Payer: Self-pay | Admitting: Physical Therapy

## 2020-04-29 ENCOUNTER — Ambulatory Visit: Payer: PPO | Attending: Nurse Practitioner | Admitting: Physical Therapy

## 2020-04-29 DIAGNOSIS — M5442 Lumbago with sciatica, left side: Secondary | ICD-10-CM | POA: Diagnosis not present

## 2020-04-29 DIAGNOSIS — R252 Cramp and spasm: Secondary | ICD-10-CM | POA: Insufficient documentation

## 2020-04-29 DIAGNOSIS — R262 Difficulty in walking, not elsewhere classified: Secondary | ICD-10-CM | POA: Diagnosis not present

## 2020-04-29 NOTE — Therapy (Signed)
[place]. [place], [place], [address] Phone: [phone]   Fax:  [phone]  Physical Therapy Treatment  Patient Details  Name: Megan Martinez MRN: [id number] Date of Birth: [birth year] Referring Provider (PT): Nche   Encounter Date: 04/29/2020   PT End of Session - 04/29/20 1059    Visit Number 5    Date for PT Re-Evaluation 05/12/20    PT Start Time 5462    PT Stop Time 1057    PT Time Calculation (min) 42 min    Activity Tolerance Patient tolerated treatment well    Behavior During Therapy [facility] for tasks assessed/performed           Past Medical History:  Diagnosis Date  . Allergy   . Arthritis   . GERD (gastroesophageal reflux disease)   . Hypertension   . Hypothyroidism   . Pneumonia   . Stroke [facility])    right arm is "not working", speech a little slurred  . Thyroid disease     Past Surgical History:  Procedure Laterality Date  . ABDOMINAL HYSTERECTOMY    . APPENDECTOMY    . BREAST SURGERY Right    lumpectomy  . CATARACT EXTRACTION    . CHOLECYSTECTOMY    . IR ANGIO INTRA EXTRACRAN SEL COM CAROTID INNOMINATE BILAT MOD SED  07/24/2019  . IR ANGIO VERTEBRAL SEL SUBCLAVIAN INNOMINATE UNI L MOD SED  07/24/2019  . IR INTRA CRAN STENT  07/24/2019  . RADIOLOGY WITH ANESTHESIA N/A 07/09/2019   Procedure: RADIOLOGY WITH ANESTHESIA STENT PLACEMENT;  Surgeon: Luanne Bras, MD;  Location: [place];  Service: Radiology;  Laterality: N/A;  . RADIOLOGY WITH ANESTHESIA N/A 07/23/2019   Procedure: RADIOLOGY WITH ANESTHESIA STENT PLACEMENT;  Surgeon: Luanne Bras, MD;  Location: [place];  Service: Radiology;  Laterality: N/A;  . RADIOLOGY WITH ANESTHESIA N/A 07/24/2019   Procedure: RADIOLOGY WITH ANESTHESIA STENT PLACEMENT;  Surgeon: Luanne Bras, MD;  Location: [place];  Service: Radiology;  Laterality: N/A;  . TUBAL LIGATION      There were no vitals filed for this visit.   Subjective Assessment -  04/29/20 1015    Subjective "I feel all right, but the sciatic nerve is about to aggravate me to death"    Currently in Pain? Yes    Pain Score 7     Pain Location Buttocks    Pain Orientation Left                             [facility] Adult PT Treatment/Exercise - 04/29/20 0001      Lumbar Exercises: Stretches   Passive Hamstring Stretch Left;4 reps;20 seconds    Single Knee to Chest Stretch Left;4 reps;30 seconds    Lower Trunk Rotation 5 reps;10 seconds    Piriformis Stretch Left;4 reps;20 seconds      Lumbar Exercises: Aerobic   Recumbent Bike L1 x 5 min     Nustep L4 3 mins      Lumbar Exercises: Machines for Strengthening   Cybex Knee Extension 5lb 2x10     Cybex Knee Flexion 20lb 2x10       Lumbar Exercises: Standing   Row Strengthening;Both;20 reps;Theraband    Theraband Level (Row) Level 3 (Green)    Shoulder Extension Strengthening;Both;20 reps;Theraband    Theraband Level (Shoulder Extension) Level 2 (Red)      Lumbar Exercises: Supine   Bridge 1 second;10 reps;Compliant  Other Supine Lumbar Exercises LE on pball bridges, K2C, Oblq                     PT Short Term Goals - 03/28/20 1036      PT SHORT TERM GOAL #1   Title Independent with initial HEP    Baseline for stretching    Status Achieved             PT Long Term Goals - 04/29/20 1100      PT LONG TERM GOAL #1   Title Pt will report resolution of radiating pain in LLE    Status On-going      PT LONG TERM GOAL #2   Title Pt will report 50% reduction in pain    Status On-going                 Plan - 04/29/20 1100    Clinical Impression Statement Added some postural and LE strength to today's session. No reports of pain with the exercises. Cues needed for core engagement with supine interventions.Her LLE is very tight noted with passive stretching.    Personal Factors and Comorbidities Comorbidity 1    Comorbidities hx of stroke affecting R side     Examination-Activity Limitations Caring for Others;Lift;Sit;Sleep;Stand    Examination-Participation Restrictions Community Activity;Interpersonal Relationship    Stability/Clinical Decision Making Evolving/Moderate complexity    Rehab Potential Good    PT Frequency 1x / week    PT Duration 6 weeks    PT Treatment/Interventions ADLs/Self Care Home Management;Cryotherapy;Electrical Stimulation;Iontophoresis 4mg /ml Dexamethasone;Moist Heat;Therapeutic exercise;Therapeutic activities;Patient/family education;Manual techniques;Passive range of motion;Dry needling    PT Next Visit Plan traction if indicated, postural strenght           Patient will benefit from skilled therapeutic intervention in order to improve the following deficits and impairments:  Abnormal gait, Decreased range of motion, Increased muscle spasms, Pain, Impaired flexibility, Decreased mobility, Postural dysfunction  Visit Diagnosis: Acute left-sided low back pain with left-sided sciatica  Difficulty in walking, not elsewhere classified  Cramp and spasm     Problem List Patient Active Problem List   Diagnosis Date Noted  . Sciatica of left side 03/14/2020  . Cough 12/31/2019  . 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 Elevated LDL cholesterol level 08/07/2019  . Type 2 diabetes mellitus with hyperglycemia, without long-term current use of insulin ([place]) 08/07/2019  . H/O cerebral artery stenosis 07/24/2019  . 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 Abnormality of gait 07/19/2019  . Right wrist drop   . Right foot drop   . Hypokalemia   . Stenosis of artery ([place])   . Benign essential HTN   . Stage 2 chronic kidney disease   . Prediabetes   . Transaminitis   . Hypoalbuminemia due to protein-calorie malnutrition ([place])   . Labile blood pressure   . Left pontine cerebrovascular accident ([place]) 06/20/2019  . Acute ischemic  stroke ([place])   . Right hemiparesis ([place])   . Dysphagia, post-stroke   . Basilar artery stenosis with infarction [facility]) s/p tPA 06/19/2019  . Stroke (cerebrum) ([place]) L pontine d/t BA stenosis 06/14/2019  . Subcutaneous nodule of left foot 01/03/2019  . Atopic dermatitis 10/18/2018  . Seasonal allergies 08/09/2018  . 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 Osteoarthritis of carpometacarpal (CMC) joint of thumb 11/07/2017  . Acquired hypothyroidism 08/04/2015  . Essential  hypertension 08/04/2015  . GERD without esophagitis 08/04/2015  . Mixed hyperlipidemia 08/04/2015  . Osteopenia 08/04/2015  . Primary insomnia 08/04/2015  . Pure hypertriglyceridemia 08/04/2015    Scot Jun, PTA 04/29/2020, 11:03 AM  [place]. [place], [place], [address] Phone: [phone]   Fax:  [phone]  Name: Megan Martinez MRN: [id number] Date of Birth: May 15, 1951

## 2020-05-07 ENCOUNTER — Ambulatory Visit: Payer: PPO | Admitting: Physical Therapy

## 2020-05-07 ENCOUNTER — Encounter: Payer: Self-pay | Admitting: Physical Therapy

## 2020-05-07 ENCOUNTER — Other Ambulatory Visit: Payer: Self-pay

## 2020-05-07 DIAGNOSIS — R262 Difficulty in walking, not elsewhere classified: Secondary | ICD-10-CM

## 2020-05-07 DIAGNOSIS — M5442 Lumbago with sciatica, left side: Secondary | ICD-10-CM

## 2020-05-07 DIAGNOSIS — R252 Cramp and spasm: Secondary | ICD-10-CM

## 2020-05-07 NOTE — Therapy (Addendum)
[place]. [place], [place], [address] Phone: [phone]   Fax:  [phone]  Physical Therapy Treatment  Patient Details  Name: Megan Martinez MRN: [id number] Date of Birth: [birth year] Referring Provider (PT): Nche   Encounter Date: 05/07/2020   PT End of Session - 05/07/20 1000    Visit Number 6    Date for PT Re-Evaluation 05/12/20    PT Start Time 0930    PT Stop Time 5974    PT Time Calculation (min) 45 min    Activity Tolerance Patient tolerated treatment well    Behavior During Therapy [facility] for tasks assessed/performed           Past Medical History:  Diagnosis Date  . Allergy   . Arthritis   . GERD (gastroesophageal reflux disease)   . Hypertension   . Hypothyroidism   . Pneumonia   . Stroke [facility])    right arm is "not working", speech a little slurred  . Thyroid disease     Past Surgical History:  Procedure Laterality Date  . ABDOMINAL HYSTERECTOMY    . APPENDECTOMY    . BREAST SURGERY Right    lumpectomy  . CATARACT EXTRACTION    . CHOLECYSTECTOMY    . IR ANGIO INTRA EXTRACRAN SEL COM CAROTID INNOMINATE BILAT MOD SED  07/24/2019  . IR ANGIO VERTEBRAL SEL SUBCLAVIAN INNOMINATE UNI L MOD SED  07/24/2019  . IR INTRA CRAN STENT  07/24/2019  . RADIOLOGY WITH ANESTHESIA N/A 07/09/2019   Procedure: RADIOLOGY WITH ANESTHESIA STENT PLACEMENT;  Surgeon: Luanne Bras, MD;  Location: [place];  Service: Radiology;  Laterality: N/A;  . RADIOLOGY WITH ANESTHESIA N/A 07/23/2019   Procedure: RADIOLOGY WITH ANESTHESIA STENT PLACEMENT;  Surgeon: Luanne Bras, MD;  Location: [place];  Service: Radiology;  Laterality: N/A;  . RADIOLOGY WITH ANESTHESIA N/A 07/24/2019   Procedure: RADIOLOGY WITH ANESTHESIA STENT PLACEMENT;  Surgeon: Luanne Bras, MD;  Location: [place];  Service: Radiology;  Laterality: N/A;  . TUBAL LIGATION      There were no vitals filed for this visit.   Subjective Assessment -  05/07/20 0929    Subjective "That sciatic nerve has been giving me a fit all week"    Currently in Pain? Yes    Pain Score 7     Pain Location Buttocks    Pain Orientation Left                             [facility] Adult PT Treatment/Exercise - 05/07/20 0001      Lumbar Exercises: Aerobic   Nustep L4 6 mins      Lumbar Exercises: Machines for Strengthening   Other Lumbar Machine Exercise Rows & Lats 15lb 2x10      Lumbar Exercises: Standing   Shoulder Extension Strengthening;Both;20 reps    Shoulder Extension Limitations 5      Lumbar Exercises: Supine   Bridge 1 second;10 reps;Compliant    Other Supine Lumbar Exercises LE on pball bridges, K2C, Oblq       Traction   Type of Traction Lumbar    Min (lbs) 50    Max (lbs) 60    Hold Time 60    Rest Time 20    Time 12                    PT Short Term Goals - 05/07/20 1001  PT SHORT TERM GOAL #1   Title Independent with initial HEP    Status Achieved             PT Long Term Goals - 05/07/20 1000      PT LONG TERM GOAL #1   Title Pt will report resolution of radiating pain in LLE    Status On-going      PT LONG TERM GOAL #2   Title Pt will report 50% reduction in pain    Status On-going      PT LONG TERM GOAL #3   Title Pt will demo L hamstring flexibility to 90 deg with no increase in radiating pain    Status Partially Met                 Plan - 05/07/20 1001    Clinical Impression Statement Continues with the postural strengthening and added traction to treatment. Tactile and verbal cues given to prevent postural sway with seated rows. Core weakness present with supine interventions while LE on ball. No issues with the increase pull on traction.    Personal Factors and Comorbidities Comorbidity 1    Comorbidities hx of stroke affecting R side    Examination-Activity Limitations Caring for Others;Lift;Sit;Sleep;Stand    Examination-Participation Restrictions Community  Activity;Interpersonal Relationship    Stability/Clinical Decision Making Evolving/Moderate complexity    Clinical Decision Making Low    Rehab Potential Good    PT Frequency 1x / week    PT Duration 6 weeks    PT Treatment/Interventions ADLs/Self Care Home Management;Cryotherapy;Electrical Stimulation;Iontophoresis 80m/ml Dexamethasone;Moist Heat;Therapeutic exercise;Therapeutic activities;Patient/family education;Manual techniques;Passive range of motion;Dry needling    PT Next Visit Plan traction if indicated, postural strength           Patient will benefit from skilled therapeutic intervention in order to improve the following deficits and impairments:  Abnormal gait,Decreased range of motion,Increased muscle spasms,Pain,Impaired flexibility,Decreased mobility,Postural dysfunction  Visit Diagnosis: Cramp and spasm  Difficulty in walking, not elsewhere classified  Acute left-sided low back pain with left-sided sciatica     Problem List Patient Active Problem List   Diagnosis Date Noted  . Sciatica of left side 03/14/2020  . Cough 12/31/2019  . 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 Elevated LDL cholesterol level 08/07/2019  . Type 2 diabetes mellitus with hyperglycemia, without long-term current use of insulin ([place] 08/07/2019  . H/O cerebral artery stenosis 07/24/2019  . 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 Abnormality of gait 07/19/2019  . Right wrist drop   . Right foot drop   . Hypokalemia   . Stenosis of artery ([place]   . Benign essential HTN   . Stage 2 chronic kidney disease   . Prediabetes   . Transaminitis   . Hypoalbuminemia due to protein-calorie malnutrition ([place]   . Labile blood pressure   . Left pontine cerebrovascular accident ([place] 06/20/2019  . Acute ischemic stroke ([place]   . Right hemiparesis ([place]   . Dysphagia, post-stroke   . Basilar artery stenosis  with infarction ([facility] s/p tPA 06/19/2019  . Stroke (cerebrum) ([place] L pontine d/t BA stenosis 06/14/2019  . Subcutaneous nodule of left foot 01/03/2019  . Atopic dermatitis 10/18/2018  . Seasonal allergies 08/09/2018  . 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 Osteoarthritis of carpometacarpal (CMC) joint of thumb 11/07/2017  . Acquired hypothyroidism 08/04/2015  . Essential hypertension 08/04/2015  . GERD  without esophagitis 08/04/2015  . Mixed hyperlipidemia 08/04/2015  . Osteopenia 08/04/2015  . Primary insomnia 08/04/2015  . Pure hypertriglyceridemia 08/04/2015  PHYSICAL THERAPY DISCHARGE SUMMARY   Plan: Patient agrees to discharge.  Patient goals were not met. Patient is being discharged due to not returning since the last visit.  ?????       Scot Jun 05/07/2020, 10:04 AM  [place]. [place], [place], [address] Phone: [phone]   Fax:  [phone]  Name: Megan Martinez MRN: [id number] Date of Birth: 02-Sep-1950

## 2020-05-09 DIAGNOSIS — I69351 Hemiplegia and hemiparesis following cerebral infarction affecting right dominant side: Secondary | ICD-10-CM | POA: Diagnosis not present

## 2020-05-13 ENCOUNTER — Ambulatory Visit: Payer: PPO | Admitting: Physical Therapy

## 2020-05-13 ENCOUNTER — Other Ambulatory Visit: Payer: Self-pay | Admitting: Family Medicine

## 2020-05-13 DIAGNOSIS — I10 Essential (primary) hypertension: Secondary | ICD-10-CM

## 2020-05-19 ENCOUNTER — Other Ambulatory Visit: Payer: Self-pay

## 2020-05-19 ENCOUNTER — Ambulatory Visit (INDEPENDENT_AMBULATORY_CARE_PROVIDER_SITE_OTHER): Payer: PPO | Admitting: Family

## 2020-05-19 ENCOUNTER — Encounter: Payer: Self-pay | Admitting: Family

## 2020-05-19 VITALS — BP 118/66 | HR 57 | Temp 96.8°F | Ht 64.0 in | Wt 150.6 lb

## 2020-05-19 DIAGNOSIS — M5432 Sciatica, left side: Secondary | ICD-10-CM | POA: Diagnosis not present

## 2020-05-19 DIAGNOSIS — M5137 Other intervertebral disc degeneration, lumbosacral region: Secondary | ICD-10-CM

## 2020-05-19 MED ORDER — PREDNISONE 10 MG (21) PO TBPK: ORAL_TABLET | ORAL | 0 refills | Status: DC

## 2020-05-19 MED ORDER — GABAPENTIN 300 MG PO CAPS
300.0000 mg | ORAL_CAPSULE | Freq: Every day | ORAL | 1 refills | Status: DC
Start: 2020-05-19 — End: 2020-07-21

## 2020-05-19 NOTE — Patient Instructions (Signed)
Radicular Pain Radicular pain is a type of pain that spreads from your back or neck along a spinal nerve. Spinal nerves are nerves that leave the spinal cord and go to the muscles. Radicular pain is sometimes called radiculopathy, radiculitis, or a pinched nerve. When you have this type of pain, you may also have weakness, numbness, or tingling in the area of your body that is supplied by the nerve. The pain may feel sharp and burning. Depending on which spinal nerve is affected, the pain may occur in the:  Neck area (cervical radicular pain). You may also feel pain, numbness, weakness, or tingling in the arms.  Mid-spine area (thoracic radicular pain). You would feel this pain in the back and chest. This type is rare.  Lower back area (lumbar radicular pain). You would feel this pain as low back pain. You may feel pain, numbness, weakness, or tingling in the buttocks or legs. Sciatica is a type of lumbar radicular pain that shoots down the back of the leg. Radicular pain occurs when one of the spinal nerves becomes irritated or squeezed (compressed). It is often caused by something pushing on a spinal nerve, such as one of the bones of the spine (vertebrae) or one of the round cushions between vertebrae (intervertebral disks). This can result from:  An injury.  Wear and tear or aging of a disk.  The growth of a bone spur that pushes on the nerve. Radicular pain often goes away when you follow instructions from your health care provider for relieving pain at home. Follow these instructions at home: Managing pain      If directed, put ice on the affected area: ? Put ice in a plastic bag. ? Place a towel between your skin and the bag. ? Leave the ice on for 20 minutes, 2-3 times a day.  If directed, apply heat to the affected area as often as told by your health care provider. Use the heat source that your health care provider recommends, such as a moist heat pack or a heating pad. ? Place  a towel between your skin and the heat source. ? Leave the heat on for 20-30 minutes. ? Remove the heat if your skin turns bright red. This is especially important if you are unable to feel pain, heat, or cold. You may have a greater risk of getting burned. Activity   Do not sit or rest in bed for long periods of time.  Try to stay as active as possible. Ask your health care provider what type of exercise or activity is best for you.  Avoid activities that make your pain worse, such as bending and lifting.  Do not lift anything that is heavier than 10 lb (4.5 kg), or the limit that you are told, until your health care provider says that it is safe.  Practice using proper technique when lifting items. Proper lifting technique involves bending your knees and rising up.  Do strength and range-of-motion exercises only as told by your health care provider or physical therapist. General instructions  Take over-the-counter and prescription medicines only as told by your health care provider.  Pay attention to any changes in your symptoms.  Keep all follow-up visits as told by your health care provider. This is important. ? Your health care provider may send you to a physical therapist to help with this pain. Contact a health care provider if:  Your pain and other symptoms get worse.  Your pain medicine is not   helping.  Your pain has not improved after a few weeks of home care.  You have a fever. Get help right away if:  You have severe pain, weakness, or numbness.  You have difficulty with bladder or bowel control. Summary  Radicular pain is a type of pain that spreads from your back or neck along a spinal nerve.  When you have radicular pain, you may also have weakness, numbness, or tingling in the area of your body that is supplied by the nerve.  The pain may feel sharp or burning.  Radicular pain may be treated with ice, heat, medicines, or physical therapy. This  information is not intended to replace advice given to you by your health care provider. Make sure you discuss any questions you have with your health care provider. Document Revised: 11/22/2017 Document Reviewed: 11/22/2017 Elsevier Patient Education  2020 [employer]  

## 2020-05-19 NOTE — Progress Notes (Signed)
Acute Office Visit  Subjective:    Patient ID: Megan Martinez, female    DOB: [birth year], 69 y.o.   MRN: [id number]  Chief Complaint  Patient presents with  . Hip Pain    Left hip pain will not go away medications not helping, patient states that she's not sleeping well due to the pain.     HPI Patient is in today with c/o left hip pain and numbness in her toes on the left side. The pain is worse at night when she tries to sleep. Currently taking Tylenol and Gabapentin 100-200 mg that do not help. Pain is 8/10. Pain also present she attempting to do house work and going up and down her steps.  Better at rest. Has been doing PT that she feels costs too much and not helping. She also has been doing home exercises, applying ice and heat with no relief.  Past Medical History:  Diagnosis Date  . Allergy   . Arthritis   . GERD (gastroesophageal reflux disease)   . Hypertension   . Hypothyroidism   . Pneumonia   . Stroke [facility])    right arm is "not working", speech a little slurred  . Thyroid disease     Past Surgical History:  Procedure Laterality Date  . ABDOMINAL HYSTERECTOMY    . APPENDECTOMY    . BREAST SURGERY Right    lumpectomy  . CATARACT EXTRACTION    . CHOLECYSTECTOMY    . IR ANGIO INTRA EXTRACRAN SEL COM CAROTID INNOMINATE BILAT MOD SED  07/24/2019  . IR ANGIO VERTEBRAL SEL SUBCLAVIAN INNOMINATE UNI L MOD SED  07/24/2019  . IR INTRA CRAN STENT  07/24/2019  . RADIOLOGY WITH ANESTHESIA N/A 07/09/2019   Procedure: RADIOLOGY WITH ANESTHESIA STENT PLACEMENT;  Surgeon: Luanne Bras, MD;  Location: [place];  Service: Radiology;  Laterality: N/A;  . RADIOLOGY WITH ANESTHESIA N/A 07/23/2019   Procedure: RADIOLOGY WITH ANESTHESIA STENT PLACEMENT;  Surgeon: Luanne Bras, MD;  Location: [place];  Service: Radiology;  Laterality: N/A;  . RADIOLOGY WITH ANESTHESIA N/A 07/24/2019   Procedure: RADIOLOGY WITH ANESTHESIA STENT PLACEMENT;  Surgeon: Luanne Bras, MD;  Location: [place];  Service: Radiology;  Laterality: N/A;  . TUBAL LIGATION      History reviewed. No pertinent family history.  Social History   Socioeconomic History  . Marital status: Married    Spouse name: Not on file  . Number of children: Not on file  . Years of education: Not on file  . Highest education level: Not on file  Occupational History  . Not on file  Tobacco Use  . Smoking status: Former Smoker    Quit date: 1970    Years since quitting: 52.0  . Smokeless tobacco: Never Used  Vaping Use  . Vaping Use: Never used  Substance and Sexual Activity  . Alcohol use: Yes    Comment: occasional  . Drug use: Never  . Sexual activity: Not on file  Other Topics Concern  . Not on file  Social History Narrative  . Not on file   Social Determinants of Health   Financial Resource Strain: Not on file  Food Insecurity: Not on file  Transportation Needs: Not on file  Physical Activity: Not on file  Stress: Not on file  Social Connections: Not on file  Intimate Partner Violence: Not on file    Outpatient Medications Prior to Visit  Medication Sig Dispense Refill  . acetaminophen (TYLENOL) 325 MG tablet Take  2 tablets (650 mg total) by mouth every 4 (four) hours as needed for mild pain (or temp > 37.5 C (99.5 F)).    Marland Kitchen amLODipine (NORVASC) 10 MG tablet Take 1 tablet (10 mg total) by mouth daily. 90 tablet 1  . atorvastatin (LIPITOR) 80 MG tablet TAKE 1 TABLET(80 MG) BY MOUTH DAILY AT 6 PM 90 tablet 1  . benzonatate (TESSALON) 100 MG capsule Take 1 capsule (100 mg total) by mouth 3 (three) times daily as needed for cough. 20 capsule 0  . clopidogrel (PLAVIX) 75 MG tablet Take 1 tablet (75 mg total) by mouth daily. 30 tablet 1  . levothyroxine (SYNTHROID) 137 MCG tablet TAKE 1 TABLET(137 MCG) BY MOUTH DAILY AT 6 AM 90 tablet 0  . potassium chloride (KLOR-CON) 8 MEQ tablet Take 20 mEq by mouth daily.    Marland Kitchen gabapentin (NEURONTIN) 100 MG capsule TAKE 1 CAPSULE BY MOUTH AT NIGHT FOR 1  WEEK. INCREASE TO 1 2 TIMES DAILY 60 capsule 1  . aspirin EC 325 MG EC tablet Take 1 tablet (325 mg total) by mouth daily. (Patient not taking: Reported on 05/19/2020) 30 tablet 0  . cloNIDine (CATAPRES) 0.2 MG tablet TAKE 1 TABLET(0.2 MG) BY MOUTH TWICE DAILY (Patient not taking: Reported on 05/19/2020) 60 tablet 2  . fluticasone (FLONASE) 50 MCG/ACT nasal spray Place 2 sprays into both nostrils daily. (Patient not taking: No sig reported) 16 g 6  . levocetirizine (XYZAL) 5 MG tablet TAKE 1 TABLET(5 MG) BY MOUTH EVERY EVENING (Patient not taking: Reported on 05/19/2020) 90 tablet 3  . pantoprazole (PROTONIX) 40 MG tablet TAKE 1 TABLET(40 MG) BY MOUTH AT BEDTIME (Patient not taking: Reported on 05/19/2020) 90 tablet 0   Facility-Administered Medications Prior to Visit  Medication Dose Route Frequency Provider Last Rate Last Admin  . midazolam (VERSED) 5 MG/5ML injection   Intravenous Anesthesia Intra-op Marena Chancy, CRNA   1 mg at 07/09/19 0845    Allergies  Allergen Reactions  . Latex Itching and Rash  . Statins Other (See Comments)    Myalgias, "sick"   . Amoxicillin Rash    Did it involve swelling of the face/tongue/throat, SOB, or low BP? No Did it involve sudden or severe rash/hives, skin peeling, or any reaction on the inside of your mouth or nose? Yes Did you need to seek medical attention at a hospital or doctor's office? N/A When did it last happen? Child If all above answers are "NO", may proceed with cephalosporin use.  . Codeine Nausea Only  . Penicillin G Rash    Did it involve swelling of the face/tongue/throat, SOB, or low BP? No Did it involve sudden or severe rash/hives, skin peeling, or any reaction on the inside of your mouth or nose? Yes Did you need to seek medical attention at a hospital or doctor's office? N/A When did it last happen?Child If all above answers are "NO", may proceed with cephalosporin use.    Review of Systems  Constitutional:  Negative.   Respiratory: Negative.   Cardiovascular: Negative.   Musculoskeletal: Positive for arthralgias and back pain.       Left hip pain, low back pain  Skin: Negative.   Neurological: Positive for weakness and numbness.       Left foot numbness and tingling  Hematological: Negative.   Psychiatric/Behavioral: Negative.        Objective:    Physical Exam Constitutional:      Appearance: Normal appearance.  HENT:  Nose: Nose normal.  Cardiovascular:     Rate and Rhythm: Normal rate and regular rhythm.  Pulmonary:     Effort: Pulmonary effort is normal.     Breath sounds: Normal breath sounds.  Abdominal:     General: Abdomen is flat. Bowel sounds are normal.     Palpations: Abdomen is soft.  Musculoskeletal:        General: No swelling, tenderness or signs of injury. Normal range of motion.     Cervical back: Normal range of motion and neck supple.     Right lower leg: No edema.     Left lower leg: No edema.  Skin:    General: Skin is warm and dry.  Neurological:     General: No focal deficit present.     Mental Status: She is alert. Mental status is at baseline. She is disoriented.     Coordination: Coordination normal.     Deep Tendon Reflexes: Reflexes normal.  Psychiatric:        Mood and Affect: Mood normal.        Behavior: Behavior normal.     BP 118/66   Pulse (!) 57   Temp (!) 96.8 F (36 C) (Temporal)   Ht 5\' 4"  (1.626 m)   Wt 150 lb 9.6 oz (68.3 kg)   SpO2 96%   BMI 25.85 kg/m  Wt Readings from Last 3 Encounters:  05/19/20 150 lb 9.6 oz (68.3 kg)  03/14/20 145 lb 12.8 oz (66.1 kg)  02/13/20 144 lb 9.6 oz (65.6 kg)    Health Maintenance Due  Topic Date Due  . Hepatitis C Screening  Never done  . FOOT EXAM  Never done  . OPHTHALMOLOGY EXAM  Never done  . TETANUS/TDAP  Never done  . COLONOSCOPY (Pts 45-43yrs Insurance coverage will need to be confirmed)  Never done  . MAMMOGRAM  Never done  . DEXA SCAN  Never done  . INFLUENZA  VACCINE  12/23/2019  . COVID-19 Vaccine (3 - Moderna risk 4-dose series) 12/27/2019    There are no preventive care reminders to display for this patient.   Lab Results  Component Value Date   TSH 1.43 11/21/2019   Lab Results  Component Value Date   WBC 6.8 11/21/2019   HGB 13.8 11/21/2019   HCT 39.3 11/21/2019   MCV 84.1 11/21/2019   PLT 330.0 11/21/2019   Lab Results  Component Value Date   NA 137 02/13/2020   K 3.9 02/13/2020   CO2 37 (H) 02/13/2020   GLUCOSE 168 (H) 02/13/2020   BUN 10 02/13/2020   CREATININE 0.71 02/13/2020   BILITOT 0.7 11/21/2019   ALKPHOS 46 11/21/2019   AST 13 11/21/2019   ALT 13 11/21/2019   PROT 6.9 11/21/2019   ALBUMIN 4.5 11/21/2019   CALCIUM 10.2 02/13/2020   ANIONGAP 9 07/25/2019   GFR 81.49 02/13/2020   Lab Results  Component Value Date   CHOL 143 11/21/2019   Lab Results  Component Value Date   HDL 44.30 11/21/2019   Lab Results  Component Value Date   LDLCALC 71 11/21/2019   Lab Results  Component Value Date   TRIG 136.0 11/21/2019   Lab Results  Component Value Date   CHOLHDL 3 11/21/2019   Lab Results  Component Value Date   HGBA1C 5.8 11/21/2019       Assessment & Plan:   Problem List Items Addressed This Visit    Sciatica of left side - Primary  Relevant Medications   gabapentin (NEURONTIN) 300 MG capsule    Other Visit Diagnoses    DDD (degenerative disc disease), lumbosacral       Relevant Medications   predniSONE (STERAPRED UNI-PAK 21 TAB) 10 MG (21) TBPK tablet       Meds ordered this encounter  Medications  . predniSONE (STERAPRED UNI-PAK 21 TAB) 10 MG (21) TBPK tablet    Sig: As directed    Dispense:  21 tablet    Refill:  0  . gabapentin (NEURONTIN) 300 MG capsule    Sig: Take 1-2 capsules (300-600 mg total) by mouth at bedtime.    Dispense:  90 capsule    Refill:  1    Will refer consider MRI l spine and refer to neuro-surg if no better. Kennyth Arnold, FNP

## 2020-05-20 ENCOUNTER — Ambulatory Visit: Payer: PPO | Admitting: Physical Therapy

## 2020-05-21 ENCOUNTER — Other Ambulatory Visit (HOSPITAL_COMMUNITY): Payer: Self-pay | Admitting: Physician Assistant

## 2020-05-21 MED ORDER — CLOPIDOGREL BISULFATE 75 MG PO TABS
75.0000 mg | ORAL_TABLET | Freq: Every day | ORAL | 3 refills | Status: DC
Start: 2020-05-21 — End: 2020-08-11

## 2020-05-27 ENCOUNTER — Ambulatory Visit: Payer: PPO | Admitting: Physical Therapy

## 2020-06-05 ENCOUNTER — Other Ambulatory Visit (HOSPITAL_COMMUNITY): Payer: Self-pay | Admitting: Interventional Radiology

## 2020-06-05 ENCOUNTER — Other Ambulatory Visit (HOSPITAL_COMMUNITY): Payer: Self-pay

## 2020-06-05 DIAGNOSIS — I771 Stricture of artery: Secondary | ICD-10-CM

## 2020-06-09 DIAGNOSIS — I69351 Hemiplegia and hemiparesis following cerebral infarction affecting right dominant side: Secondary | ICD-10-CM | POA: Diagnosis not present

## 2020-06-16 ENCOUNTER — Ambulatory Visit: Payer: PPO | Admitting: Family Medicine

## 2020-06-23 ENCOUNTER — Other Ambulatory Visit: Payer: Self-pay

## 2020-06-23 ENCOUNTER — Encounter (HOSPITAL_COMMUNITY): Payer: Self-pay

## 2020-06-23 ENCOUNTER — Ambulatory Visit (HOSPITAL_COMMUNITY)
Admission: RE | Admit: 2020-06-23 | Discharge: 2020-06-23 | Disposition: A | Discharge status: Home / Self Care | Payer: PPO | Source: Ambulatory Visit | Attending: Interventional Radiology | Admitting: Interventional Radiology

## 2020-06-23 DIAGNOSIS — I6503 Occlusion and stenosis of bilateral vertebral arteries: Secondary | ICD-10-CM | POA: Diagnosis not present

## 2020-06-23 DIAGNOSIS — I771 Stricture of artery: Secondary | ICD-10-CM | POA: Insufficient documentation

## 2020-06-23 DIAGNOSIS — J9859 Other diseases of mediastinum, not elsewhere classified: Secondary | ICD-10-CM | POA: Diagnosis not present

## 2020-06-23 DIAGNOSIS — I6523 Occlusion and stenosis of bilateral carotid arteries: Secondary | ICD-10-CM | POA: Diagnosis not present

## 2020-06-23 DIAGNOSIS — I6622 Occlusion and stenosis of left posterior cerebral artery: Secondary | ICD-10-CM | POA: Diagnosis not present

## 2020-06-23 LAB — POCT I-STAT CREATININE: Creatinine, Ser: 0.7 mg/dL (ref 0.44–1.00)

## 2020-06-23 MED ORDER — IOHEXOL 350 MG/ML SOLN
75.0000 mL | Freq: Once | INTRAVENOUS | Status: AC | PRN
  Administered 2020-06-23: 75 mL via INTRAVENOUS

## 2020-06-24 ENCOUNTER — Telehealth (HOSPITAL_COMMUNITY): Payer: Self-pay

## 2020-06-24 NOTE — Telephone Encounter (Signed)
Pt agreed to f/u in 6 months with diagnostic angiogram and to f/u with pcp regarding incidental finding on cta. AW

## 2020-06-27 ENCOUNTER — Other Ambulatory Visit: Payer: Self-pay | Admitting: Family Medicine

## 2020-06-27 DIAGNOSIS — E876 Hypokalemia: Secondary | ICD-10-CM

## 2020-06-27 DIAGNOSIS — E039 Hypothyroidism, unspecified: Secondary | ICD-10-CM

## 2020-06-27 DIAGNOSIS — I10 Essential (primary) hypertension: Secondary | ICD-10-CM

## 2020-07-10 DIAGNOSIS — I69351 Hemiplegia and hemiparesis following cerebral infarction affecting right dominant side: Secondary | ICD-10-CM | POA: Diagnosis not present

## 2020-07-21 ENCOUNTER — Other Ambulatory Visit: Payer: Self-pay | Admitting: Family

## 2020-07-21 ENCOUNTER — Telehealth: Payer: Self-pay

## 2020-07-21 MED ORDER — GABAPENTIN 300 MG PO CAPS: 300.0000 mg | ORAL_CAPSULE | Freq: Every day | ORAL | 1 refills | Status: DC

## 2020-07-21 NOTE — Telephone Encounter (Signed)
Pt calling to request a refill of medication Gabapentin.  Pt said that her pharmacy said that she would have to cal to get a refill.  Pt next OV 07/30/20 Last OV 12/27/21w/[facility] Last fill 05/19/20 #90/1

## 2020-07-23 ENCOUNTER — Telehealth: Payer: Self-pay | Admitting: Family Medicine

## 2020-07-23 NOTE — Telephone Encounter (Signed)
error 

## 2020-07-23 NOTE — Telephone Encounter (Signed)
Pt wanted to know if she can have some filled until her appt next wednesday

## 2020-07-23 NOTE — Telephone Encounter (Signed)
Called pharmacy to check on patient Rx per pharmacist patient can pick up Rx on or after 07/26/20 right now is to soon from last pick up. Patient aware and will wait until Saturday.

## 2020-07-30 ENCOUNTER — Other Ambulatory Visit: Payer: Self-pay

## 2020-07-30 ENCOUNTER — Ambulatory Visit (INDEPENDENT_AMBULATORY_CARE_PROVIDER_SITE_OTHER): Payer: PPO | Admitting: Family Medicine

## 2020-07-30 ENCOUNTER — Encounter: Payer: Self-pay | Admitting: Family Medicine

## 2020-07-30 VITALS — BP 115/64 | HR 58 | Temp 98.1°F | Ht 64.0 in | Wt 154.6 lb

## 2020-07-30 DIAGNOSIS — E039 Hypothyroidism, unspecified: Secondary | ICD-10-CM | POA: Diagnosis not present

## 2020-07-30 DIAGNOSIS — I10 Essential (primary) hypertension: Secondary | ICD-10-CM | POA: Diagnosis not present

## 2020-07-30 DIAGNOSIS — M5432 Sciatica, left side: Secondary | ICD-10-CM

## 2020-07-30 DIAGNOSIS — E1165 Type 2 diabetes mellitus with hyperglycemia: Secondary | ICD-10-CM

## 2020-07-30 DIAGNOSIS — G8191 Hemiplegia, unspecified affecting right dominant side: Secondary | ICD-10-CM | POA: Diagnosis not present

## 2020-07-30 DIAGNOSIS — I639 Cerebral infarction, unspecified: Secondary | ICD-10-CM | POA: Diagnosis not present

## 2020-07-30 DIAGNOSIS — I6322 Cerebral infarction due to unspecified occlusion or stenosis of basilar arteries: Secondary | ICD-10-CM

## 2020-07-30 DIAGNOSIS — E876 Hypokalemia: Secondary | ICD-10-CM | POA: Diagnosis not present

## 2020-07-30 LAB — CBC
HCT: 37.4 % (ref 36.0–46.0)
Hemoglobin: 12.8 g/dL (ref 12.0–15.0)
MCHC: 34.3 g/dL (ref 30.0–36.0)
MCV: 87.2 fl (ref 78.0–100.0)
Platelets: 393 10*3/uL (ref 150.0–400.0)
RBC: 4.29 Mil/uL (ref 3.87–5.11)
RDW: 13.5 % (ref 11.5–15.5)
WBC: 6.9 10*3/uL (ref 4.0–10.5)

## 2020-07-30 LAB — COMPREHENSIVE METABOLIC PANEL
ALT: 9 U/L (ref 0–35)
AST: 11 U/L (ref 0–37)
Albumin: 4.1 g/dL (ref 3.5–5.2)
Alkaline Phosphatase: 59 U/L (ref 39–117)
BUN: 7 mg/dL (ref 6–23)
CO2: 31 mEq/L (ref 19–32)
Calcium: 9.6 mg/dL (ref 8.4–10.5)
Chloride: 104 mEq/L (ref 96–112)
Creatinine, Ser: 0.79 mg/dL (ref 0.40–1.20)
GFR: 76.01 mL/min (ref >60.00)
Glucose, Bld: 108 mg/dL — ABNORMAL HIGH (ref 70–99)
Potassium: 4 mEq/L (ref 3.5–5.1)
Sodium: 143 mEq/L (ref 135–145)
Total Bilirubin: 0.4 mg/dL (ref 0.2–1.2)
Total Protein: 6.8 g/dL (ref 6.0–8.3)

## 2020-07-30 LAB — TSH: TSH: 1.38 u[IU]/mL (ref 0.35–4.50)

## 2020-07-30 LAB — LDL CHOLESTEROL, DIRECT: Direct LDL: 58 mg/dL

## 2020-07-30 LAB — HEMOGLOBIN A1C: Hgb A1c MFr Bld: 6.2 % (ref 4.6–6.5)

## 2020-07-30 NOTE — Progress Notes (Signed)
Established Patient Office Visit  Subjective:  Patient ID: Megan Martinez, female    DOB: Feb 01, 1951  Age: 70 y.o. MRN: [id number]  CC:  Chief Complaint  Patient presents with  . Follow-up    Follow up on sciatica on eft side, no concerns.     HPI Megan Martinez presents for follow-up of diabetes, elevated cholesterol, vascular disease, hypothyroidism hypertension and sciatica.  Sciatica fairly well controlled with Neurontin therapy.  Lipids controlled with high-dose atorvastatin.  She is nonfasting today.  Blood pressure well controlled with clonidine status post discontinuation of chlorthalidone secondary to hypokalemia.  She is no longer taking potassium as well.  Diabetes is diet controlled.  Continues to see Dr. Colon Flattery for follow-up of her stroke.  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  Dr. Colon Flattery is following this for her with a repeat scan on follow-up with her in 6 months.  Past Medical History:  Diagnosis Date  . Allergy   . Arthritis   . GERD (gastroesophageal reflux disease)   . Hypertension   . Hypothyroidism   . Pneumonia   . Stroke [facility])    right arm is "not working", speech a little slurred  . Thyroid disease     Past Surgical History:  Procedure Laterality Date  . ABDOMINAL HYSTERECTOMY    . APPENDECTOMY    . BREAST SURGERY Right    lumpectomy  . CATARACT EXTRACTION    . CHOLECYSTECTOMY    . IR ANGIO INTRA EXTRACRAN SEL COM CAROTID INNOMINATE BILAT MOD SED  07/24/2019  . IR ANGIO VERTEBRAL SEL SUBCLAVIAN INNOMINATE UNI L MOD SED  07/24/2019  . IR INTRA CRAN STENT  07/24/2019  . RADIOLOGY WITH ANESTHESIA N/A 07/09/2019   Procedure: RADIOLOGY WITH ANESTHESIA STENT PLACEMENT;  Surgeon: Luanne Bras, MD;  Location: [place];  Service: Radiology;  Laterality: N/A;  . RADIOLOGY WITH ANESTHESIA N/A 07/23/2019   Procedure: RADIOLOGY WITH ANESTHESIA STENT PLACEMENT;  Surgeon: Luanne Bras, MD;  Location: [place];  Service:  Radiology;  Laterality: N/A;  . RADIOLOGY WITH ANESTHESIA N/A 07/24/2019   Procedure: RADIOLOGY WITH ANESTHESIA STENT PLACEMENT;  Surgeon: Luanne Bras, MD;  Location: [place];  Service: Radiology;  Laterality: N/A;  . TUBAL LIGATION      History reviewed. No pertinent family history.  Social History   Socioeconomic History  . Marital status: Married    Spouse name: Not on file  . Number of children: Not on file  . Years of education: Not on file  . Highest education level: Not on file  Occupational History  . Not on file  Tobacco Use  . Smoking status: Former Smoker    Quit date: 1970    Years since quitting: 52.2  . Smokeless tobacco: Never Used  Vaping Use  . Vaping Use: Never used  Substance and Sexual Activity  . Alcohol use: Yes    Comment: occasional  . Drug use: Never  . Sexual activity: Not on file  Other Topics Concern  . Not on file  Social History Narrative  . Not on file   Social Determinants of Health   Financial Resource Strain: Not on file  Food Insecurity: Not on file  Transportation Needs: Not on file  Physical Activity: Not on file  Stress: Not on file  Social Connections: Not on file  Intimate Partner Violence: Not on file    Outpatient Medications Prior to Visit  Medication Sig Dispense Refill  .  acetaminophen (TYLENOL) 325 MG tablet Take 2 tablets (650 mg total) by mouth every 4 (four) hours as needed for mild pain (or temp > 37.5 C (99.5 F)).    Marland Kitchen amLODipine (NORVASC) 10 MG tablet Take 1 tablet (10 mg total) by mouth daily. 90 tablet 1  . atorvastatin (LIPITOR) 80 MG tablet TAKE 1 TABLET(80 MG) BY MOUTH DAILY AT 6 PM 90 tablet 1  . cloNIDine (CATAPRES) 0.2 MG tablet TAKE 1 TABLET(0.2 MG) BY MOUTH TWICE DAILY 60 tablet 2  . clopidogrel (PLAVIX) 75 MG tablet Take 1 tablet (75 mg total) by mouth daily. 30 tablet 3  . gabapentin (NEURONTIN) 300 MG capsule TAKE 1 TO 2 CAPSULES(300 TO 600 MG) BY MOUTH AT BEDTIME 90 capsule 1  . levothyroxine  (SYNTHROID) 137 MCG tablet TAKE 1 TABLET(137 MCG) BY MOUTH DAILY AT 6 AM 90 tablet 0  . pantoprazole (PROTONIX) 40 MG tablet TAKE 1 TABLET(40 MG) BY MOUTH AT BEDTIME 90 tablet 0  . potassium chloride (KLOR-CON) 8 MEQ tablet Take 20 mEq by mouth daily.    Marland Kitchen aspirin EC 325 MG EC tablet Take 1 tablet (325 mg total) by mouth daily. (Patient not taking: No sig reported) 30 tablet 0  . fluticasone (FLONASE) 50 MCG/ACT nasal spray Place 2 sprays into both nostrils daily. (Patient not taking: Reported on 07/30/2020) 16 g 6  . levocetirizine (XYZAL) 5 MG tablet TAKE 1 TABLET(5 MG) BY MOUTH EVERY EVENING (Patient not taking: No sig reported) 90 tablet 3  . predniSONE (STERAPRED UNI-PAK 21 TAB) 10 MG (21) TBPK tablet As directed (Patient not taking: Reported on 07/30/2020) 21 tablet 0  . benzonatate (TESSALON) 100 MG capsule Take 1 capsule (100 mg total) by mouth 3 (three) times daily as needed for cough. (Patient not taking: Reported on 07/30/2020) 20 capsule 0  . chlorthalidone (HYGROTON) 25 MG tablet TAKE 1 TABLET(25 MG) BY MOUTH DAILY 90 tablet 1  . potassium chloride SA (KLOR-CON) 20 MEQ tablet TAKE 1 TABLET(20 MEQ) BY MOUTH DAILY (Patient not taking: Reported on 07/30/2020) 90 tablet 1   Facility-Administered Medications Prior to Visit  Medication Dose Route Frequency Provider Last Rate Last Admin  . midazolam (VERSED) 5 MG/5ML injection   Intravenous Anesthesia Intra-op Mariea Clonts, CRNA   1 mg at 07/09/19 0845    Allergies  Allergen Reactions  . Latex Itching and Rash  . Statins Other (See Comments)    Myalgias, "sick"   . Amoxicillin Rash    Did it involve swelling of the face/tongue/throat, SOB, or low BP? No Did it involve sudden or severe rash/hives, skin peeling, or any reaction on the inside of your mouth or nose? Yes Did you need to seek medical attention at a hospital or doctor's office? N/A When did it last happen? Child If all above answers are "NO", may proceed with  cephalosporin use.  . Codeine Nausea Only  . Penicillin G Rash    Did it involve swelling of the face/tongue/throat, SOB, or low BP? No Did it involve sudden or severe rash/hives, skin peeling, or any reaction on the inside of your mouth or nose? Yes Did you need to seek medical attention at a hospital or doctor's office? N/A When did it last happen?Child If all above answers are "NO", may proceed with cephalosporin use.    ROS Review of Systems  Constitutional: Negative.   HENT: Negative.   Eyes: Negative for photophobia and visual disturbance.  Respiratory: Negative.   Cardiovascular: Negative.  Gastrointestinal: Negative.   Endocrine: Negative for polyphagia and polyuria.  Genitourinary: Negative.   Musculoskeletal: Positive for gait problem.  Skin: Negative.   Allergic/Immunologic: Negative for immunocompromised state.  Neurological: Positive for weakness. Negative for light-headedness.  Hematological: Does not bruise/bleed easily.  Psychiatric/Behavioral: Negative.       Objective:    Physical Exam Vitals and nursing note reviewed.  Constitutional:      General: She is not in acute distress.    Appearance: Normal appearance. She is not ill-appearing, toxic-appearing or diaphoretic.  HENT:     Head: Normocephalic and atraumatic.     Right Ear: Tympanic membrane, ear canal and external ear normal.     Left Ear: Tympanic membrane, ear canal and external ear normal.     Mouth/Throat:     Mouth: Mucous membranes are dry.  Eyes:     General: No scleral icterus.    Extraocular Movements: Extraocular movements intact.     Conjunctiva/sclera: Conjunctivae normal.     Pupils: Pupils are equal, round, and reactive to light.  Cardiovascular:     Rate and Rhythm: Normal rate and regular rhythm.  Pulmonary:     Effort: Pulmonary effort is normal.  Abdominal:     General: Bowel sounds are normal.  Musculoskeletal:     Cervical back: No rigidity or tenderness.     Right  lower leg: No edema.     Left lower leg: No edema.  Lymphadenopathy:     Cervical: No cervical adenopathy.  Skin:    General: Skin is warm and dry.  Neurological:     General: No focal deficit present.     Mental Status: She is alert and oriented to person, place, and time.  Psychiatric:        Mood and Affect: Mood normal.        Behavior: Behavior normal.     BP 115/64   Pulse (!) 58   Temp 98.1 F (36.7 C) (Temporal)   Ht 5\' 4"  (1.626 m)   Wt 154 lb 9.6 oz (70.1 kg)   SpO2 97%   BMI 26.54 kg/m  Wt Readings from Last 3 Encounters:  07/30/20 154 lb 9.6 oz (70.1 kg)  05/19/20 150 lb 9.6 oz (68.3 kg)  03/14/20 145 lb 12.8 oz (66.1 kg)     Health Maintenance Due  Topic Date Due  . Hepatitis C Screening  Never done  . FOOT EXAM  Never done  . OPHTHALMOLOGY EXAM  Never done  . TETANUS/TDAP  Never done  . COLONOSCOPY (Pts 45-88yrs Insurance coverage will need to be confirmed)  Never done  . MAMMOGRAM  Never done  . DEXA SCAN  Never done  . INFLUENZA VACCINE  12/23/2019  . COVID-19 Vaccine (3 - Moderna risk 4-dose series) 12/27/2019  . HEMOGLOBIN A1C  05/22/2020    There are no preventive care reminders to display for this patient.  Lab Results  Component Value Date   TSH 1.43 11/21/2019   Lab Results  Component Value Date   WBC 6.8 11/21/2019   HGB 13.8 11/21/2019   HCT 39.3 11/21/2019   MCV 84.1 11/21/2019   PLT 330.0 11/21/2019   Lab Results  Component Value Date   NA 137 02/13/2020   K 3.9 02/13/2020   CO2 37 (H) 02/13/2020   GLUCOSE 168 (H) 02/13/2020   BUN 10 02/13/2020   CREATININE 0.70 06/23/2020   BILITOT 0.7 11/21/2019   ALKPHOS 46 11/21/2019   AST 13 11/21/2019  ALT 13 11/21/2019   PROT 6.9 11/21/2019   ALBUMIN 4.5 11/21/2019   CALCIUM 10.2 02/13/2020   ANIONGAP 9 07/25/2019   GFR 81.49 02/13/2020   Lab Results  Component Value Date   CHOL 143 11/21/2019   Lab Results  Component Value Date   HDL 44.30 11/21/2019   Lab Results   Component Value Date   LDLCALC 71 11/21/2019   Lab Results  Component Value Date   TRIG 136.0 11/21/2019   Lab Results  Component Value Date   CHOLHDL 3 11/21/2019   Lab Results  Component Value Date   HGBA1C 5.8 11/21/2019      Assessment & Plan:   Problem List Items Addressed This Visit      Cardiovascular and Mediastinum   Essential hypertension   Relevant Orders   Comprehensive metabolic panel   CBC   Left pontine cerebrovascular accident [facility])   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   Follow-up: Return in about 6 months (around 01/30/2021).  Blood pressure well controlled with clonidine.  We will continue high-dose atorvastatin.  Continue Plavix.  Diabetes diet controlled.  Continue Neurontin for sciatica.  Libby Maw, MD

## 2020-08-09 ENCOUNTER — Telehealth: Payer: Self-pay | Admitting: Family Medicine

## 2020-08-09 DIAGNOSIS — I10 Essential (primary) hypertension: Secondary | ICD-10-CM

## 2020-08-11 MED ORDER — CLOPIDOGREL BISULFATE 75 MG PO TABS: 75.0000 mg | ORAL_TABLET | Freq: Every day | ORAL | 3 refills | Status: DC

## 2020-08-11 MED ORDER — CLONIDINE HCL 0.2 MG PO TABS: ORAL_TABLET | ORAL | 2 refills | Status: DC

## 2020-08-11 NOTE — Telephone Encounter (Signed)
Rx sent in

## 2020-08-11 NOTE — Addendum Note (Signed)
Addended by: Lynda Rainwater on: 08/11/2020 11:53 AM   Modules accepted: Orders

## 2020-08-11 NOTE — Telephone Encounter (Signed)
Patient is calling to get this medication (Clonidine) sent to [facility] in [place]. She no longer uses [facility]. Please call her at [phone] if you have any questions.

## 2020-08-11 NOTE — Addendum Note (Signed)
Addended by: Lynda Rainwater on: 08/11/2020 11:51 AM   Modules accepted: Orders

## 2020-08-25 ENCOUNTER — Other Ambulatory Visit: Payer: Self-pay | Admitting: Family Medicine

## 2020-08-25 DIAGNOSIS — E7800 Pure hypercholesterolemia, unspecified: Secondary | ICD-10-CM

## 2020-09-11 ENCOUNTER — Other Ambulatory Visit: Payer: Self-pay | Admitting: Family Medicine

## 2020-09-11 DIAGNOSIS — I10 Essential (primary) hypertension: Secondary | ICD-10-CM

## 2020-09-27 ENCOUNTER — Other Ambulatory Visit: Payer: Self-pay | Admitting: Family Medicine

## 2020-09-27 DIAGNOSIS — E039 Hypothyroidism, unspecified: Secondary | ICD-10-CM

## 2020-09-27 NOTE — Telephone Encounter (Signed)
Last OV 07/30/20 Last fill for both medications 06/27/20 #90/0

## 2020-10-13 ENCOUNTER — Telehealth: Payer: Self-pay | Admitting: Family Medicine

## 2020-10-13 NOTE — Telephone Encounter (Signed)
Pt called back wanting to finish speaking with a Triage nurse.  I was told to let her know she would get a cb from a possible different area code.

## 2020-10-13 NOTE — Telephone Encounter (Signed)
Pt called and said that her leg was swelling up really bad and requested to speak to triage nurse. Transferred over

## 2020-10-14 NOTE — Telephone Encounter (Signed)
Tried returning patients call no answer VM picked on fist ring. Will call back

## 2020-10-15 NOTE — Telephone Encounter (Signed)
Returned call no answer LMTCB 

## 2020-10-15 NOTE — Telephone Encounter (Signed)
Appointment scheduled for OV

## 2020-10-16 ENCOUNTER — Encounter: Payer: Self-pay | Admitting: Family Medicine

## 2020-10-16 ENCOUNTER — Other Ambulatory Visit: Payer: Self-pay

## 2020-10-16 ENCOUNTER — Ambulatory Visit (INDEPENDENT_AMBULATORY_CARE_PROVIDER_SITE_OTHER): Payer: PPO | Admitting: Family Medicine

## 2020-10-16 VITALS — BP 116/68 | HR 58 | Temp 97.2°F | Ht 64.0 in | Wt 160.8 lb

## 2020-10-16 DIAGNOSIS — R609 Edema, unspecified: Secondary | ICD-10-CM | POA: Diagnosis not present

## 2020-10-16 NOTE — Progress Notes (Signed)
Established Patient Office Visit  Subjective:  Patient ID: Megan Martinez, female    DOB: [birth year]  Age: 70 y.o. MRN: [id number]  CC:  Chief Complaint  Patient presents with  . Leg Swelling    Swelling in right leg x 1 month come and go.     HPI Megan Martinez presents for evaluation of swelling in her lower extremities.  She feels the right is a little worse than the left.  It goes down overnight but then reaccumulate's throughout the day.  She wears a brace on her right ankle when she is moving out and about for stability.  There has been no shortness of breath chest pain or cough.  She is gaining strength with her right arm and leg.  She is actually on the right side now.  Past Medical History:  Diagnosis Date  . Allergy   . Arthritis   . GERD (gastroesophageal reflux disease)   . Hypertension   . Hypothyroidism   . Pneumonia   . Stroke [facility])    right arm is "not working", speech a little slurred  . Thyroid disease     Past Surgical History:  Procedure Laterality Date  . ABDOMINAL HYSTERECTOMY    . APPENDECTOMY    . BREAST SURGERY Right    lumpectomy  . CATARACT EXTRACTION    . CHOLECYSTECTOMY    . IR ANGIO INTRA EXTRACRAN SEL COM CAROTID INNOMINATE BILAT MOD SED  07/24/2019  . IR ANGIO VERTEBRAL SEL SUBCLAVIAN INNOMINATE UNI L MOD SED  07/24/2019  . IR INTRA CRAN STENT  07/24/2019  . RADIOLOGY WITH ANESTHESIA N/A 07/09/2019   Procedure: RADIOLOGY WITH ANESTHESIA STENT PLACEMENT;  Surgeon: Luanne Bras, MD;  Location: [place];  Service: Radiology;  Laterality: N/A;  . RADIOLOGY WITH ANESTHESIA N/A 07/23/2019   Procedure: RADIOLOGY WITH ANESTHESIA STENT PLACEMENT;  Surgeon: Luanne Bras, MD;  Location: [place];  Service: Radiology;  Laterality: N/A;  . RADIOLOGY WITH ANESTHESIA N/A 07/24/2019   Procedure: RADIOLOGY WITH ANESTHESIA STENT PLACEMENT;  Surgeon: Luanne Bras, MD;  Location: [place];  Service: Radiology;  Laterality: N/A;  . TUBAL LIGATION      No  family history on file.  Social History   Socioeconomic History  . Marital status: Married    Spouse name: Not on file  . Number of children: Not on file  . Years of education: Not on file  . Highest education level: Not on file  Occupational History  . Not on file  Tobacco Use  . Smoking status: Former Smoker    Quit date: 1970    Years since quitting: 52.4  . Smokeless tobacco: Never Used  Vaping Use  . Vaping Use: Never used  Substance and Sexual Activity  . Alcohol use: Yes    Comment: occasional  . Drug use: Never  . Sexual activity: Not on file  Other Topics Concern  . Not on file  Social History Narrative  . Not on file   Social Determinants of Health   Financial Resource Strain: Not on file  Food Insecurity: Not on file  Transportation Needs: Not on file  Physical Activity: Not on file  Stress: Not on file  Social Connections: Not on file  Intimate Partner Violence: Not on file    Outpatient Medications Prior to Visit  Medication Sig Dispense Refill  . acetaminophen (TYLENOL) 325 MG tablet Take 2 tablets (650 mg total) by mouth every 4 (four) hours as needed for mild pain (  or temp > 37.5 C (99.5 F)).    Marland Kitchen amLODipine (NORVASC) 10 MG tablet TAKE 1 TABLET(10 MG) BY MOUTH DAILY 90 tablet 1  . aspirin EC 325 MG EC tablet Take 1 tablet (325 mg total) by mouth daily. 30 tablet 0  . atorvastatin (LIPITOR) 80 MG tablet TAKE 1 TABLET(80 MG) BY MOUTH DAILY AT 6 PM 90 tablet 1  . cloNIDine (CATAPRES) 0.2 MG tablet TAKE 1 TABLET(0.2 MG) BY MOUTH TWICE DAILY 60 tablet 2  . clopidogrel (PLAVIX) 75 MG tablet Take 1 tablet (75 mg total) by mouth daily. 30 tablet 3  . gabapentin (NEURONTIN) 300 MG capsule TAKE 1 TO 2 CAPSULES(300 TO 600 MG) BY MOUTH AT BEDTIME 90 capsule 1  . levothyroxine (SYNTHROID) 137 MCG tablet TAKE 1 TABLET(137 MCG) BY MOUTH DAILY AT 6 AM 90 tablet 0  . pantoprazole (PROTONIX) 40 MG tablet TAKE 1 TABLET(40 MG) BY MOUTH AT BEDTIME 90 tablet 0  .  fluticasone (FLONASE) 50 MCG/ACT nasal spray Place 2 sprays into both nostrils daily. (Patient not taking: No sig reported) 16 g 6  . levocetirizine (XYZAL) 5 MG tablet TAKE 1 TABLET(5 MG) BY MOUTH EVERY EVENING (Patient not taking: No sig reported) 90 tablet 3  . potassium chloride (KLOR-CON) 8 MEQ tablet Take 20 mEq by mouth daily. (Patient not taking: Reported on 10/16/2020)    . predniSONE (STERAPRED UNI-PAK 21 TAB) 10 MG (21) TBPK tablet As directed (Patient not taking: Reported on 07/30/2020) 21 tablet 0   Facility-Administered Medications Prior to Visit  Medication Dose Route Frequency Provider Last Rate Last Admin  . midazolam (VERSED) 5 MG/5ML injection   Intravenous Anesthesia Intra-op Mariea Clonts, CRNA   1 mg at 07/09/19 0845    Allergies  Allergen Reactions  . Latex Itching and Rash  . Statins Other (See Comments)    Myalgias, "sick"   . Amoxicillin Rash    Did it involve swelling of the face/tongue/throat, SOB, or low BP? No Did it involve sudden or severe rash/hives, skin peeling, or any reaction on the inside of your mouth or nose? Yes Did you need to seek medical attention at a hospital or doctor's office? N/A When did it last happen? Child If all above answers are "NO", may proceed with cephalosporin use.  . Codeine Nausea Only  . Penicillin G Rash    Did it involve swelling of the face/tongue/throat, SOB, or low BP? No Did it involve sudden or severe rash/hives, skin peeling, or any reaction on the inside of your mouth or nose? Yes Did you need to seek medical attention at a hospital or doctor's office? N/A When did it last happen?Child If all above answers are "NO", may proceed with cephalosporin use.    ROS Review of Systems  Constitutional: Negative.   HENT: Negative.   Eyes: Negative for photophobia and visual disturbance.  Respiratory: Negative.  Negative for chest tightness and shortness of breath.   Cardiovascular: Negative.  Negative for chest  pain and palpitations.  Gastrointestinal: Negative.   Endocrine: Negative for polyphagia and polyuria.  Genitourinary: Negative.   Musculoskeletal: Negative for myalgias.  Psychiatric/Behavioral: Negative.       Objective:    Physical Exam Vitals and nursing note reviewed.  Constitutional:      General: She is not in acute distress.    Appearance: Normal appearance. She is not ill-appearing, toxic-appearing or diaphoretic.  HENT:     Head: Normocephalic and atraumatic.     Right Ear: Tympanic  membrane, ear canal and external ear normal.     Left Ear: Tympanic membrane, ear canal and external ear normal.     Mouth/Throat:     Mouth: Mucous membranes are moist.     Pharynx: Oropharynx is clear. No oropharyngeal exudate or posterior oropharyngeal erythema.  Eyes:     General: No scleral icterus.       Right eye: No discharge.        Left eye: No discharge.     Extraocular Movements: Extraocular movements intact.     Conjunctiva/sclera: Conjunctivae normal.     Pupils: Pupils are equal, round, and reactive to light.  Cardiovascular:     Rate and Rhythm: Normal rate and regular rhythm.     Pulses:          Dorsalis pedis pulses are 2+ on the right side and 2+ on the left side.       Posterior tibial pulses are 1+ on the right side and 1+ on the left side.  Pulmonary:     Effort: Pulmonary effort is normal.     Breath sounds: Normal breath sounds.  Abdominal:     General: Bowel sounds are normal.  Musculoskeletal:     Cervical back: No rigidity or tenderness.     Right lower leg: Edema (1 plus) present.     Left lower leg: Edema (1 plus) present.       Legs:  Lymphadenopathy:     Cervical: No cervical adenopathy.  Neurological:     General: No focal deficit present.     Mental Status: She is alert and oriented to person, place, and time.  Psychiatric:        Mood and Affect: Mood normal.        Behavior: Behavior normal.     BP 116/68   Pulse (!) 58   Temp (!)  97.2 F (36.2 C) (Temporal)   Ht 5\' 4"  (1.626 m)   Wt 160 lb 12.8 oz (72.9 kg)   SpO2 96%   BMI 27.60 kg/m  Wt Readings from Last 3 Encounters:  10/16/20 160 lb 12.8 oz (72.9 kg)  07/30/20 154 lb 9.6 oz (70.1 kg)  05/19/20 150 lb 9.6 oz (68.3 kg)     Health Maintenance Due  Topic Date Due  . FOOT EXAM  Never done  . OPHTHALMOLOGY EXAM  Never done  . Hepatitis C Screening  Never done  . TETANUS/TDAP  Never done  . COLONOSCOPY (Pts 45-61yrs Insurance coverage will need to be confirmed)  Never done  . MAMMOGRAM  Never done  . Zoster Vaccines- Shingrix (1 of 2) Never done  . DEXA SCAN  Never done  . COVID-19 Vaccine (3 - Moderna risk 4-dose series) 12/27/2019    There are no preventive care reminders to display for this patient.  Lab Results  Component Value Date   TSH 1.38 07/30/2020   Lab Results  Component Value Date   WBC 6.9 07/30/2020   HGB 12.8 07/30/2020   HCT 37.4 07/30/2020   MCV 87.2 07/30/2020   PLT 393.0 07/30/2020   Lab Results  Component Value Date   NA 143 07/30/2020   K 4.0 07/30/2020   CO2 31 07/30/2020   GLUCOSE 108 (H) 07/30/2020   BUN 7 07/30/2020   CREATININE 0.79 07/30/2020   BILITOT 0.4 07/30/2020   ALKPHOS 59 07/30/2020   AST 11 07/30/2020   ALT 9 07/30/2020   PROT 6.8 07/30/2020   ALBUMIN 4.1 07/30/2020  CALCIUM 9.6 07/30/2020   ANIONGAP 9 07/25/2019   GFR 76.01 07/30/2020   Lab Results  Component Value Date   CHOL 143 11/21/2019   Lab Results  Component Value Date   HDL 44.30 11/21/2019   Lab Results  Component Value Date   LDLCALC 71 11/21/2019   Lab Results  Component Value Date   TRIG 136.0 11/21/2019   Lab Results  Component Value Date   CHOLHDL 3 11/21/2019   Lab Results  Component Value Date   HGBA1C 6.2 07/30/2020      Assessment & Plan:   Problem List Items Addressed This Visit      Other   Dependent edema - Primary      No orders of the defined types were placed in this  encounter.   Follow-up: Return if symptoms worsen or fail to improve, for otherwise will see you in September. Libby Maw, MD

## 2020-10-16 NOTE — Patient Instructions (Signed)
Edema  Edema is when you have too much fluid in your body or under your skin. Edema may make your legs, feet, and ankles swell up. Swelling is also common in looser tissues, like around your eyes. This is a common condition. It gets more common as you get older. There are many possible causes of edema. Eating too much salt (sodium) and being on your feet or sitting for a long time can cause edema in your legs, feet, and ankles. Hot weather may make edema worse. Edema is usually painless. Your skin may look swollen or shiny. Follow these instructions at home:  Keep the swollen body part raised (elevated) above the level of your heart when you are sitting or lying down.  Do not sit still or stand for a long time.  Do not wear tight clothes. Do not wear garters on your upper legs.  Exercise your legs. This can help the swelling go down.  Wear elastic bandages or support stockings as told by your doctor.  Eat a low-salt (low-sodium) diet to reduce fluid as told by your doctor.  Depending on the cause of your swelling, you may need to limit how much fluid you drink (fluid restriction).  Take over-the-counter and prescription medicines only as told by your doctor. Contact a doctor if:  Treatment is not working.  You have heart, liver, or kidney disease and have symptoms of edema.  You have sudden and unexplained weight gain. Get help right away if:  You have shortness of breath or chest pain.  You cannot breathe when you lie down.  You have pain, redness, or warmth in the swollen areas.  You have heart, liver, or kidney disease and get edema all of a sudden.  You have a fever and your symptoms get worse all of a sudden. Summary  Edema is when you have too much fluid in your body or under your skin.  Edema may make your legs, feet, and ankles swell up. Swelling is also common in looser tissues, like around your eyes.  Raise (elevate) the swollen body part above the level of your  heart when you are sitting or lying down.  Follow your doctor's instructions about diet and how much fluid you can drink (fluid restriction). This information is not intended to replace advice given to you by your health care provider. Make sure you discuss any questions you have with your health care provider. Document Revised: 03/05/2020 Document Reviewed: 03/05/2020 Elsevier Patient Education  2021 Elsevier Inc.  

## 2020-10-29 ENCOUNTER — Telehealth: Payer: PPO | Admitting: Family Medicine

## 2020-10-30 ENCOUNTER — Encounter: Payer: Self-pay | Admitting: Family Medicine

## 2020-10-30 ENCOUNTER — Telehealth (INDEPENDENT_AMBULATORY_CARE_PROVIDER_SITE_OTHER): Payer: PPO | Admitting: Family Medicine

## 2020-10-30 VITALS — Temp 98.0°F

## 2020-10-30 DIAGNOSIS — R0981 Nasal congestion: Secondary | ICD-10-CM

## 2020-10-30 DIAGNOSIS — R059 Cough, unspecified: Secondary | ICD-10-CM | POA: Diagnosis not present

## 2020-10-30 MED ORDER — BENZONATATE 100 MG PO CAPS: 100.0000 mg | ORAL_CAPSULE | Freq: Three times a day (TID) | ORAL | 0 refills | Status: DC | PRN

## 2020-10-30 NOTE — Progress Notes (Signed)
Virtual Visit via Video Note  I connected with Ann  on 10/30/20 at 10:00 AM EDT by a video enabled telemedicine application and verified that I am speaking with the correct person using two identifiers.  Location patient: home, [place] Location provider:work or home office Persons participating in the virtual visit: patient, provider  I discussed the limitations of evaluation and management by telemedicine and the availability of in person appointments. The patient expressed understanding and agreed to proceed.   HPI:  Acute telemedicine visit for : -Onset: 6 days ago -she did one covid test the day she got sick which was negative -grandson had a cold and she feels she got this from him -Symptoms include: nasal congestion, PND, I asked her about the "rattling" she mentioned to staff and she reports this is actually in her throat when coughing - not down in her chest, cough, some sinus discomfort at times -Denies: fevers, CP, SOB, NVD, body aches, inability to eat/drink/get out of bed -Has tried: nothing -Pertinent past medical history: she denies diabetes - see below -Pertinent medication allergies:  Allergies  Allergen Reactions   Latex Itching and Rash   Statins Other (See Comments)    Myalgias, "sick"    Amoxicillin Rash    Did it involve swelling of the face/tongue/throat, SOB, or low BP? No Did it involve sudden or severe rash/hives, skin peeling, or any reaction on the inside of your mouth or nose? Yes Did you need to seek medical attention at a hospital or doctor's office? N/A When did it last happen? Child     If all above answers are "NO", may proceed with cephalosporin use.   Codeine Nausea Only   Penicillin G Rash    Did it involve swelling of the face/tongue/throat, SOB, or low BP? No Did it involve sudden or severe rash/hives, skin peeling, or any reaction on the inside of your mouth or nose? Yes Did you need to seek medical attention at a hospital or doctor's office?  N/A When did it last happen? Child If all above answers are "NO", may proceed with cephalosporin use.  -COVID-19 vaccine status: 2 doses of covid vaccine; had flu shot  ROS: See pertinent positives and negatives per HPI.  Past Medical History:  Diagnosis Date   Allergy    Arthritis    GERD (gastroesophageal reflux disease)    Hypertension    Hypothyroidism    Pneumonia    Stroke ([place])    right arm is "not working", speech a little slurred   Thyroid disease     Past Surgical History:  Procedure Laterality Date   ABDOMINAL HYSTERECTOMY     APPENDECTOMY     BREAST SURGERY Right    lumpectomy   CATARACT EXTRACTION     CHOLECYSTECTOMY     IR ANGIO INTRA EXTRACRAN SEL COM CAROTID INNOMINATE BILAT MOD SED  07/24/2019   IR ANGIO VERTEBRAL SEL SUBCLAVIAN INNOMINATE UNI L MOD SED  07/24/2019   IR INTRA CRAN STENT  07/24/2019   RADIOLOGY WITH ANESTHESIA N/A 07/09/2019   Procedure: RADIOLOGY WITH ANESTHESIA STENT PLACEMENT;  Surgeon: Luanne Bras, MD;  Location: [facility];  Service: Radiology;  Laterality: N/A;   RADIOLOGY WITH ANESTHESIA N/A 07/23/2019   Procedure: RADIOLOGY WITH ANESTHESIA STENT PLACEMENT;  Surgeon: Luanne Bras, MD;  Location: [facility];  Service: Radiology;  Laterality: N/A;   RADIOLOGY WITH ANESTHESIA N/A 07/24/2019   Procedure: RADIOLOGY WITH ANESTHESIA STENT PLACEMENT;  Surgeon: Luanne Bras, MD;  Location: [facility];  Service: Radiology;  Laterality: N/A;   TUBAL LIGATION       Current Outpatient Medications:    acetaminophen (TYLENOL) 325 MG tablet, Take 2 tablets (650 mg total) by mouth every 4 (four) hours as needed for mild pain (or temp > 37.5 C (99.5 F))., Disp:  , Rfl:    amLODipine (NORVASC) 10 MG tablet, TAKE 1 TABLET(10 MG) BY MOUTH DAILY, Disp: 90 tablet, Rfl: 1   aspirin EC 81 MG tablet, Take 81 mg by mouth daily. Swallow whole., Disp: , Rfl:    atorvastatin (LIPITOR) 80 MG tablet, TAKE 1 TABLET(80 MG) BY MOUTH DAILY AT 6 PM, Disp: 90 tablet, Rfl: 1    benzonatate (TESSALON PERLES) 100 MG capsule, Take 1 capsule (100 mg total) by mouth 3 (three) times daily as needed., Disp: 20 capsule, Rfl: 0   chlorthalidone (HYGROTON) 25 MG tablet, Take 25 mg by mouth daily., Disp: , Rfl:    cloNIDine (CATAPRES) 0.2 MG tablet, TAKE 1 TABLET(0.2 MG) BY MOUTH TWICE DAILY, Disp: 60 tablet, Rfl: 2   clopidogrel (PLAVIX) 75 MG tablet, Take 1 tablet (75 mg total) by mouth daily., Disp: 30 tablet, Rfl: 3   fluticasone (FLONASE) 50 MCG/ACT nasal spray, Place 2 sprays into both nostrils daily., Disp: 16 g, Rfl: 6   levothyroxine (SYNTHROID) 137 MCG tablet, TAKE 1 TABLET(137 MCG) BY MOUTH DAILY AT 6 AM, Disp: 90 tablet, Rfl: 0 No current facility-administered medications for this visit.  Facility-Administered Medications Ordered in Other Visits:    midazolam (VERSED) 5 MG/5ML injection, , Intravenous, Anesthesia Intra-op, Mariea Clonts, CRNA, 1 mg at 07/09/19 0845  EXAM:  VITALS per patient if applicable:  GENERAL: alert, oriented, appears well and in no acute distress  HEENT: atraumatic, conjunttiva clear, no obvious abnormalities on inspection of external nose and ears  NECK: normal movements of the head and neck  LUNGS: on inspection no signs of respiratory distress, breathing rate appears normal, no obvious gross SOB, gasping or wheezing  CV: no obvious cyanosis  MS: moves all visible extremities without noticeable abnormality  PSYCH/NEURO: pleasant and cooperative, no obvious depression or anxiety, speech and thought processing grossly intact  ASSESSMENT AND PLAN:  Discussed the following assessment and plan:  Nasal congestion  Cough  -we discussed possible serious and likely etiologies, options for evaluation and workup, limitations of telemedicine visit vs in person visit, treatment, treatment risks and precautions. Pt prefers to treat via telemedicine empirically rather than in person at this moment. Query [facility], covid with false negt  early testing vs other. She opted for repeat home covid testing, nasal saline, Tessalon rx for cough with close follow up advised with PCP or urgent care if worsening, new symptoms arise, or if is not improving with treatment. Discussed options for inperson care if PCP office not available. Did let this patient know that I only do telemedicine on Tuesdays and Thursdays for [facility]. Advised to schedule follow up visit with PCP or [facility] if any further questions or concerns to avoid delays in care.   I discussed the assessment and treatment plan with the patient. The patient was provided an opportunity to ask questions and all were answered. The patient agreed with the plan and demonstrated an understanding of the instructions.     Lucretia Kern, DO

## 2020-10-30 NOTE — Patient Instructions (Signed)
  HOME CARE TIPS:  -[facility] testing information: [email or web address] OR [phone] Most pharmacies also offer testing and home test kits. If the Covid19 test is positive, please make a prompt follow up visit with your primary care office or with [facility] to discuss treatment options. Treatments for Covid19 are best given early in the course of the illness.   -I sent the medication(s) we discussed to your pharmacy: Meds ordered this encounter  Medications   benzonatate (TESSALON PERLES) 100 MG capsule    Sig: Take 1 capsule (100 mg total) by mouth 3 (three) times daily as needed.    Dispense:  20 capsule    Refill:  0     -I sent in the [facility] treatment or referral you requested per our discussion. Please see the information provided below and discuss further with the pharmacist/treatment team.   -can use tylenol or aleve if needed for fevers, aches and pains per instructions  -can use nasal saline a few times per day if you have nasal congestion; sometimes  a short course of Afrin nasal spray for 3 days can help with symptoms as well  -stay hydrated, drink plenty of fluids and eat small healthy meals - avoid dairy  -can take 1000 IU (64mcg) Vit D3 and 100-500 mg of Vit C daily per instructions  -If the Covid test is positive, check out the [facility] website for more information on home care, transmission and treatment for COVID19  -follow up with your doctor in 2-3 days unless improving and feeling better  -stay home while sick, except to seek medical care. If you have COVID19, ideally it would be best to stay home for a full 10 days since the onset of symptoms PLUS one day of no fever and feeling better. Wear a good mask that fits snugly (such as N95 or KN95) if around others to reduce the risk of transmission.  It was nice to meet you today, and I really hope you are feeling better soon. I help [facility] out with telemedicine visits  on Tuesdays and Thursdays and am available for visits on those days. If you have any concerns or questions following this visit please schedule a follow up visit with your Primary Care doctor or seek care at a local urgent care clinic to avoid delays in care.    Seek in person care or schedule a follow up video visit promptly if your symptoms worsen, new concerns arise or you are not improving with treatment. Call 911 and/or seek emergency care if your symptoms are severe or life threatening.

## 2020-11-13 ENCOUNTER — Other Ambulatory Visit: Payer: Self-pay | Admitting: Family Medicine

## 2020-11-13 DIAGNOSIS — I10 Essential (primary) hypertension: Secondary | ICD-10-CM

## 2020-11-13 NOTE — Telephone Encounter (Signed)
Refill request for: Clonidine 0.2 mg  LR 08/11/20 LOV 10/16/20 FOV 01/28/21  Please review and advise.  Thanks  Dm/cma

## 2020-11-16 ENCOUNTER — Other Ambulatory Visit: Payer: Self-pay | Admitting: Family Medicine

## 2020-11-16 DIAGNOSIS — I10 Essential (primary) hypertension: Secondary | ICD-10-CM

## 2020-12-12 ENCOUNTER — Other Ambulatory Visit: Payer: Self-pay | Admitting: Family Medicine

## 2020-12-23 ENCOUNTER — Telehealth: Payer: Self-pay | Admitting: Family Medicine

## 2020-12-23 DIAGNOSIS — E7800 Pure hypercholesterolemia, unspecified: Secondary | ICD-10-CM

## 2020-12-23 MED ORDER — ATORVASTATIN CALCIUM 80 MG PO TABS: ORAL_TABLET | ORAL | 1 refills | Status: DC

## 2020-12-23 NOTE — Telephone Encounter (Signed)
Ast Rx sent to old pharmacy [facility]. Sent refills to [facility] patient aware and will pick up today.

## 2020-12-23 NOTE — Telephone Encounter (Signed)
Pt has been told that she does not have a refill on her atorvastatin (LIPITOR) 80 MG tablet [id number]  It clearly says she should have a refill. They told her to give [place] a call to get this filled. [facility] at [address].

## 2020-12-30 ENCOUNTER — Telehealth (HOSPITAL_COMMUNITY): Payer: Self-pay

## 2020-12-30 NOTE — Telephone Encounter (Signed)
Pt has a lot going on with taking care of her grandchild. She will call back in the next couple weeks to schedule angiogram. AW

## 2021-01-07 ENCOUNTER — Other Ambulatory Visit: Payer: Self-pay | Admitting: Family Medicine

## 2021-01-07 DIAGNOSIS — E039 Hypothyroidism, unspecified: Secondary | ICD-10-CM

## 2021-01-27 ENCOUNTER — Other Ambulatory Visit: Payer: Self-pay

## 2021-01-28 ENCOUNTER — Encounter: Payer: Self-pay | Admitting: Family Medicine

## 2021-01-28 ENCOUNTER — Ambulatory Visit (INDEPENDENT_AMBULATORY_CARE_PROVIDER_SITE_OTHER): Payer: PPO | Admitting: Family Medicine

## 2021-01-28 VITALS — BP 128/68 | HR 57 | Temp 97.0°F | Ht 64.0 in | Wt 161.6 lb

## 2021-01-28 DIAGNOSIS — Z23 Encounter for immunization: Secondary | ICD-10-CM | POA: Diagnosis not present

## 2021-01-28 DIAGNOSIS — I639 Cerebral infarction, unspecified: Secondary | ICD-10-CM

## 2021-01-28 DIAGNOSIS — I10 Essential (primary) hypertension: Secondary | ICD-10-CM

## 2021-01-28 DIAGNOSIS — E1165 Type 2 diabetes mellitus with hyperglycemia: Secondary | ICD-10-CM

## 2021-01-28 DIAGNOSIS — Z0000 Encounter for general adult medical examination without abnormal findings: Secondary | ICD-10-CM

## 2021-01-28 DIAGNOSIS — N182 Chronic kidney disease, stage 2 (mild): Secondary | ICD-10-CM | POA: Diagnosis not present

## 2021-01-28 DIAGNOSIS — E039 Hypothyroidism, unspecified: Secondary | ICD-10-CM | POA: Diagnosis not present

## 2021-01-28 DIAGNOSIS — E7800 Pure hypercholesterolemia, unspecified: Secondary | ICD-10-CM

## 2021-01-28 LAB — LIPID PANEL
Cholesterol: 146 mg/dL (ref 0–200)
HDL: 41.1 mg/dL (ref >39.00)
LDL Cholesterol: 71 mg/dL (ref 0–99)
NonHDL: 105.32
Total CHOL/HDL Ratio: 4
Triglycerides: 170 mg/dL — ABNORMAL HIGH (ref 0.0–149.0)
VLDL: 34 mg/dL (ref 0.0–40.0)

## 2021-01-28 LAB — COMPREHENSIVE METABOLIC PANEL
ALT: 15 U/L (ref 0–35)
AST: 15 U/L (ref 0–37)
Albumin: 4.4 g/dL (ref 3.5–5.2)
Alkaline Phosphatase: 72 U/L (ref 39–117)
BUN: 12 mg/dL (ref 6–23)
CO2: 27 mEq/L (ref 19–32)
Calcium: 9.6 mg/dL (ref 8.4–10.5)
Chloride: 101 mEq/L (ref 96–112)
Creatinine, Ser: 0.8 mg/dL (ref 0.40–1.20)
GFR: 74.61 mL/min (ref >60.00)
Glucose, Bld: 115 mg/dL — ABNORMAL HIGH (ref 70–99)
Potassium: 3.8 mEq/L (ref 3.5–5.1)
Sodium: 140 mEq/L (ref 135–145)
Total Bilirubin: 0.6 mg/dL (ref 0.2–1.2)
Total Protein: 7 g/dL (ref 6.0–8.3)

## 2021-01-28 LAB — CBC
HCT: 40.6 % (ref 36.0–46.0)
Hemoglobin: 13.4 g/dL (ref 12.0–15.0)
MCHC: 33 g/dL (ref 30.0–36.0)
MCV: 85.2 fl (ref 78.0–100.0)
Platelets: 326 10*3/uL (ref 150.0–400.0)
RBC: 4.76 Mil/uL (ref 3.87–5.11)
RDW: 14.2 % (ref 11.5–15.5)
WBC: 7.4 10*3/uL (ref 4.0–10.5)

## 2021-01-28 LAB — HEMOGLOBIN A1C: Hgb A1c MFr Bld: 6.4 % (ref 4.6–6.5)

## 2021-01-28 LAB — TSH: TSH: 2.12 u[IU]/mL (ref 0.35–5.50)

## 2021-01-28 NOTE — Addendum Note (Signed)
Addended by: Lynnea Ferrier on: 01/28/2021 10:04 AM   Modules accepted: Orders

## 2021-01-28 NOTE — Progress Notes (Signed)
Established Patient Office Visit  Subjective:  Patient ID: Megan Martinez, female    DOB: 1950/07/20  Age: 70 y.o. MRN: [id number]  CC:  Chief Complaint  Patient presents with   Follow-up    6 month follow up no concerns.     HPI Megan Martinez presents for follow-up of hypertension, elevated cholesterol, diabetes, hypothyroidism and health maintenance.  She is the primary caregiver for her 37-year-old grandson while his mother sorts her life out.  She is taking care of her home in her property that he alludes mowing and acre lot.  Constantly on the go.  Things have been stressful for her.  Has occasional headaches that are nonprogressive and respond to Tylenol.  Blood pressures been well controlled.  She has been taking chlorthalidone for lower extremity swelling even though it had been discontinued secondary to chronic hypokalemia.  She is not taking potassium supplementation but it it is eating a banana daily.  Assures me that she has been consuming enough calories.  Her weight has been stable.  She reports tingling in her right second through fifth toes.  Chart review shows that she is way behind on health maintenance.  Reluctant to do a colonoscopy secondary to having problems with prep last time.  No recent eye exam, mammogram or bone density amatory.   Past Medical History:  Diagnosis Date   Allergy    Arthritis    GERD (gastroesophageal reflux disease)    Hypertension    Hypothyroidism    Pneumonia    Stroke ([place])    right arm is "not working", speech a little slurred   Thyroid disease     Past Surgical History:  Procedure Laterality Date   ABDOMINAL HYSTERECTOMY     APPENDECTOMY     BREAST SURGERY Right    lumpectomy   CATARACT EXTRACTION     CHOLECYSTECTOMY     IR ANGIO INTRA EXTRACRAN SEL COM CAROTID INNOMINATE BILAT MOD SED  07/24/2019   IR ANGIO VERTEBRAL SEL SUBCLAVIAN INNOMINATE UNI L MOD SED  07/24/2019   IR INTRA CRAN STENT  07/24/2019   RADIOLOGY WITH ANESTHESIA  N/A 07/09/2019   Procedure: RADIOLOGY WITH ANESTHESIA STENT PLACEMENT;  Surgeon: Luanne Bras, MD;  Location: [facility];  Service: Radiology;  Laterality: N/A;   RADIOLOGY WITH ANESTHESIA N/A 07/23/2019   Procedure: RADIOLOGY WITH ANESTHESIA STENT PLACEMENT;  Surgeon: Luanne Bras, MD;  Location: [facility];  Service: Radiology;  Laterality: N/A;   RADIOLOGY WITH ANESTHESIA N/A 07/24/2019   Procedure: RADIOLOGY WITH ANESTHESIA STENT PLACEMENT;  Surgeon: Luanne Bras, MD;  Location: [facility];  Service: Radiology;  Laterality: N/A;   TUBAL LIGATION      No family history on file.  Social History   Socioeconomic History   Marital status: Married    Spouse name: Not on file   Number of children: Not on file   Years of education: Not on file   Highest education level: Not on file  Occupational History   Not on file  Tobacco Use   Smoking status: Former    Types: Cigarettes    Quit date: 1970    Years since quitting: 52.7   Smokeless tobacco: Never  Vaping Use   Vaping Use: Never used  Substance and Sexual Activity   Alcohol use: Yes    Comment: occasional   Drug use: Never   Sexual activity: Not on file  Other Topics Concern   Not on file  Social History Narrative  Not on file   Social Determinants of Health   Financial Resource Strain: Not on file  Food Insecurity: Not on file  Transportation Needs: Not on file  Physical Activity: Not on file  Stress: Not on file  Social Connections: Not on file  Intimate Partner Violence: Not on file    Outpatient Medications Prior to Visit  Medication Sig Dispense Refill   amLODipine (NORVASC) 10 MG tablet TAKE 1 TABLET(10 MG) BY MOUTH DAILY 90 tablet 1   aspirin EC 81 MG tablet Take 81 mg by mouth daily. Swallow whole.     atorvastatin (LIPITOR) 80 MG tablet TAKE 1 TABLET(80 MG) BY MOUTH DAILY AT 6 PM 90 tablet 1   chlorthalidone (HYGROTON) 25 MG tablet Take 25 mg by mouth daily.     cloNIDine (CATAPRES) 0.2 MG tablet TAKE ONE  TABLET BY MOUTH TWICE A DAY 60 tablet 2   clopidogrel (PLAVIX) 75 MG tablet TAKE ONE TABLET BY MOUTH ONE TIME DAILY 30 tablet 3   fluticasone (FLONASE) 50 MCG/ACT nasal spray Place 2 sprays into both nostrils daily. 16 g 6   levothyroxine (SYNTHROID) 137 MCG tablet TAKE ONE TABLET BY MOUTH ONE TIME DAILY AT 6 IN THE MORNING 90 tablet 0   acetaminophen (TYLENOL) 325 MG tablet Take 2 tablets (650 mg total) by mouth every 4 (four) hours as needed for mild pain (or temp > 37.5 C (99.5 F)).     benzonatate (TESSALON PERLES) 100 MG capsule Take 1 capsule (100 mg total) by mouth 3 (three) times daily as needed. 20 capsule 0   Facility-Administered Medications Prior to Visit  Medication Dose Route Frequency Provider Last Rate Last Admin   midazolam (VERSED) 5 MG/5ML injection   Intravenous Anesthesia Intra-op Mariea Clonts, CRNA   1 mg at 07/09/19 0845    Allergies  Allergen Reactions   Latex Itching and Rash   Statins Other (See Comments)    Myalgias, "sick"    Amoxicillin Rash    Did it involve swelling of the face/tongue/throat, SOB, or low BP? No Did it involve sudden or severe rash/hives, skin peeling, or any reaction on the inside of your mouth or nose? Yes Did you need to seek medical attention at a hospital or doctor's office? N/A When did it last happen? Child     If all above answers are "NO", may proceed with cephalosporin use.   Codeine Nausea Only   Penicillin G Rash    Did it involve swelling of the face/tongue/throat, SOB, or low BP? No Did it involve sudden or severe rash/hives, skin peeling, or any reaction on the inside of your mouth or nose? Yes Did you need to seek medical attention at a hospital or doctor's office? N/A When did it last happen? Child If all above answers are "NO", may proceed with cephalosporin use.    ROS Review of Systems  Constitutional: Negative.   HENT: Negative.    Eyes:  Negative for photophobia and visual disturbance.  Respiratory:  Negative.    Cardiovascular: Negative.   Gastrointestinal: Negative.   Endocrine: Negative for polyphagia and polyuria.  Genitourinary: Negative.   Musculoskeletal:  Negative for gait problem and joint swelling.  Skin:  Negative for pallor and rash.  Neurological:  Positive for numbness and headaches. Negative for syncope and light-headedness.     Objective:    Physical Exam Vitals and nursing note reviewed.  Constitutional:      General: She is not in acute distress.  Appearance: Normal appearance. She is normal weight. She is not ill-appearing, toxic-appearing or diaphoretic.  HENT:     Head: Normocephalic and atraumatic.     Right Ear: Tympanic membrane, ear canal and external ear normal.     Left Ear: Tympanic membrane, ear canal and external ear normal.     Mouth/Throat:     Mouth: Mucous membranes are moist.     Pharynx: Oropharynx is clear. No oropharyngeal exudate or posterior oropharyngeal erythema.  Eyes:     Extraocular Movements: Extraocular movements intact.     Conjunctiva/sclera: Conjunctivae normal.     Pupils: Pupils are equal, round, and reactive to light.  Neck:     Vascular: No carotid bruit.  Cardiovascular:     Rate and Rhythm: Normal rate and regular rhythm.  Pulmonary:     Effort: Pulmonary effort is normal.     Breath sounds: Normal breath sounds.  Abdominal:     General: Bowel sounds are normal.  Musculoskeletal:     Cervical back: No rigidity or tenderness.     Right lower leg: No edema (wearing a compression stocking.).     Left lower leg: No edema.  Lymphadenopathy:     Cervical: No cervical adenopathy.  Skin:    General: Skin is warm and dry.  Neurological:     Mental Status: She is alert and oriented to person, place, and time.  Psychiatric:        Behavior: Behavior normal.   Depression screen [facility] 2/9 01/28/2021 10/16/2020 11/19/2019  Decreased Interest 0 0 0  Down, Depressed, Hopeless 0 0 0  PHQ - 2 Score 0 0 0   Diabetic Foot Exam  - Simple   Simple Foot Form Diabetic Foot exam was performed with the following findings: Yes 01/28/2021  9:27 AM  Visual Inspection No deformities, no ulcerations, no other skin breakdown bilaterally: Yes Sensation Testing See comments: Yes Pulse Check Posterior Tibialis and Dorsalis pulse intact bilaterally: Yes Comments Sensory is intact to light touch throughout.  She reports tingling in her right second through fifth toes.      BP 128/68 (BP Location: Left Arm, Patient Position: Sitting, Cuff Size: Normal)   Pulse (!) 57   Temp (!) 97 F (36.1 C) (Temporal)   Ht '5\' 4"'$  (1.626 m)   Wt 161 lb 9.6 oz (73.3 kg)   SpO2 97%   BMI 27.74 kg/m  Wt Readings from Last 3 Encounters:  01/28/21 161 lb 9.6 oz (73.3 kg)  10/16/20 160 lb 12.8 oz (72.9 kg)  07/30/20 154 lb 9.6 oz (70.1 kg)     Health Maintenance Due  Topic Date Due   OPHTHALMOLOGY EXAM  Never done   Hepatitis C Screening  Never done   TETANUS/TDAP  Never done   Zoster Vaccines- Shingrix (1 of 2) Never done   COLONOSCOPY (Pts 45-60yr Insurance coverage will need to be confirmed)  Never done   MAMMOGRAM  Never done   DEXA SCAN  Never done    There are no preventive care reminders to display for this patient.  Lab Results  Component Value Date   TSH 1.38 07/30/2020   Lab Results  Component Value Date   WBC 6.9 07/30/2020   HGB 12.8 07/30/2020   HCT 37.4 07/30/2020   MCV 87.2 07/30/2020   PLT 393.0 07/30/2020   Lab Results  Component Value Date   NA 143 07/30/2020   K 4.0 07/30/2020   CO2 31 07/30/2020   GLUCOSE 108 (H)  07/30/2020   BUN 7 07/30/2020   CREATININE 0.79 07/30/2020   BILITOT 0.4 07/30/2020   ALKPHOS 59 07/30/2020   AST 11 07/30/2020   ALT 9 07/30/2020   PROT 6.8 07/30/2020   ALBUMIN 4.1 07/30/2020   CALCIUM 9.6 07/30/2020   ANIONGAP 9 07/25/2019   GFR 76.01 07/30/2020   Lab Results  Component Value Date   CHOL 143 11/21/2019   Lab Results  Component Value Date   HDL 44.30  11/21/2019   Lab Results  Component Value Date   LDLCALC 71 11/21/2019   Lab Results  Component Value Date   TRIG 136.0 11/21/2019   Lab Results  Component Value Date   CHOLHDL 3 11/21/2019   Lab Results  Component Value Date   HGBA1C 6.2 07/30/2020      Assessment & Plan:   Problem List Items Addressed This Visit       Cardiovascular and Mediastinum   Essential hypertension   Relevant Orders   CBC   Comprehensive metabolic panel   Urinalysis, Routine w reflex microscopic   Left pontine cerebrovascular accident [facility])     Endocrine   Acquired hypothyroidism   Relevant Orders   TSH   Type 2 diabetes mellitus with hyperglycemia, without long-term current use of insulin (HCC)   Relevant Orders   Comprehensive metabolic panel   Hemoglobin A1c   Urinalysis, Routine w reflex microscopic   Microalbumin / creatinine urine ratio   Flu vaccine HIGH DOSE PF (Fluzone High dose) (Completed)   Ambulatory referral to Ophthalmology     Genitourinary   Stage 2 chronic kidney disease   Relevant Orders   Comprehensive metabolic panel     Other   Elevated LDL cholesterol level - Primary   Relevant Orders   Comprehensive metabolic panel   Lipid panel   Healthcare maintenance   Relevant Orders   Flu vaccine HIGH DOSE PF (Fluzone High dose) (Completed)   Ambulatory referral to Gastroenterology   DG Bone Density   MM Digital Screening    No orders of the defined types were placed in this encounter.   Follow-up: Return in about 3 months (around 04/29/2021), or Expect to see todays ordered health maintenance exams accomplished or started!.  Information was given on health maintenance medical screening and immunization schedule.  I acknowledged her complicated life at this point her health maintenance issues are important as well.  She can have much of this done while her grandson is in school.  Libby Maw, MD

## 2021-02-04 ENCOUNTER — Other Ambulatory Visit: Payer: Self-pay | Admitting: Family Medicine

## 2021-02-04 ENCOUNTER — Other Ambulatory Visit (HOSPITAL_COMMUNITY): Payer: Self-pay | Admitting: Interventional Radiology

## 2021-02-04 DIAGNOSIS — I771 Stricture of artery: Secondary | ICD-10-CM

## 2021-02-04 DIAGNOSIS — I10 Essential (primary) hypertension: Secondary | ICD-10-CM

## 2021-02-06 ENCOUNTER — Ambulatory Visit (HOSPITAL_COMMUNITY): Payer: PPO

## 2021-02-09 ENCOUNTER — Other Ambulatory Visit: Payer: Self-pay | Admitting: Family Medicine

## 2021-02-09 DIAGNOSIS — Z13820 Encounter for screening for osteoporosis: Secondary | ICD-10-CM

## 2021-02-12 ENCOUNTER — Other Ambulatory Visit: Payer: PPO

## 2021-02-26 ENCOUNTER — Ambulatory Visit
Admission: RE | Admit: 2021-02-26 | Discharge: 2021-02-26 | Disposition: A | Discharge status: Home / Self Care | Payer: PPO | Source: Ambulatory Visit | Attending: Family Medicine | Admitting: Family Medicine

## 2021-02-26 ENCOUNTER — Other Ambulatory Visit: Payer: Self-pay

## 2021-02-26 DIAGNOSIS — Z780 Asymptomatic menopausal state: Secondary | ICD-10-CM | POA: Diagnosis not present

## 2021-02-26 DIAGNOSIS — Z13820 Encounter for screening for osteoporosis: Secondary | ICD-10-CM

## 2021-02-26 DIAGNOSIS — Z0000 Encounter for general adult medical examination without abnormal findings: Secondary | ICD-10-CM

## 2021-02-26 DIAGNOSIS — Z1231 Encounter for screening mammogram for malignant neoplasm of breast: Secondary | ICD-10-CM | POA: Diagnosis not present

## 2021-03-06 ENCOUNTER — Ambulatory Visit (HOSPITAL_COMMUNITY): Admission: RE | Admit: 2021-03-06 | Payer: PPO | Source: Ambulatory Visit

## 2021-03-17 ENCOUNTER — Telehealth: Payer: Self-pay | Admitting: Family Medicine

## 2021-03-17 NOTE — Telephone Encounter (Signed)
Left message for patient to call back and schedule Medicare Annual Wellness Visit (AWV) in office.  ° °If not able to come in office, please offer to do virtually or by telephone.  Left office number and my jabber #[phone]. ° °Due for AWVI ° °Please schedule at anytime with Nurse Health Advisor. °  °

## 2021-03-18 ENCOUNTER — Other Ambulatory Visit: Payer: Self-pay | Admitting: Family Medicine

## 2021-03-18 ENCOUNTER — Other Ambulatory Visit (HOSPITAL_COMMUNITY): Payer: Self-pay | Admitting: Physician Assistant

## 2021-03-18 DIAGNOSIS — E039 Hypothyroidism, unspecified: Secondary | ICD-10-CM

## 2021-03-18 DIAGNOSIS — I10 Essential (primary) hypertension: Secondary | ICD-10-CM

## 2021-03-19 ENCOUNTER — Other Ambulatory Visit: Payer: Self-pay | Admitting: Radiology

## 2021-03-20 ENCOUNTER — Ambulatory Visit (HOSPITAL_COMMUNITY)
Admission: RE | Admit: 2021-03-20 | Discharge: 2021-03-20 | Disposition: A | Discharge status: Home / Self Care | Payer: PPO | Source: Ambulatory Visit | Attending: Interventional Radiology | Admitting: Interventional Radiology

## 2021-03-20 ENCOUNTER — Other Ambulatory Visit (HOSPITAL_COMMUNITY): Payer: Self-pay | Admitting: Interventional Radiology

## 2021-03-20 ENCOUNTER — Other Ambulatory Visit: Payer: Self-pay

## 2021-03-20 ENCOUNTER — Encounter (HOSPITAL_COMMUNITY): Payer: Self-pay

## 2021-03-20 DIAGNOSIS — I771 Stricture of artery: Secondary | ICD-10-CM

## 2021-03-20 DIAGNOSIS — I651 Occlusion and stenosis of basilar artery: Secondary | ICD-10-CM | POA: Insufficient documentation

## 2021-03-20 DIAGNOSIS — I6622 Occlusion and stenosis of left posterior cerebral artery: Secondary | ICD-10-CM | POA: Diagnosis not present

## 2021-03-20 HISTORY — PX: IR ANGIO VERTEBRAL SEL VERTEBRAL UNI L MOD SED: IMG5367

## 2021-03-20 HISTORY — PX: IR ANGIO INTRA EXTRACRAN SEL COM CAROTID INNOMINATE BILAT MOD SED: IMG5360

## 2021-03-20 LAB — BASIC METABOLIC PANEL
Anion gap: 8 (ref 5–15)
BUN: 10 mg/dL (ref 8–23)
CO2: 26 mmol/L (ref 22–32)
Calcium: 9.5 mg/dL (ref 8.9–10.3)
Chloride: 105 mmol/L (ref 98–111)
Creatinine, Ser: 0.81 mg/dL (ref 0.44–1.00)
GFR, Estimated: >60 mL/min (ref >60)
Glucose, Bld: 132 mg/dL — ABNORMAL HIGH (ref 70–99)
Potassium: 3.4 mmol/L — ABNORMAL LOW (ref 3.5–5.1)
Sodium: 139 mmol/L (ref 135–145)

## 2021-03-20 LAB — CBC WITH DIFFERENTIAL/PLATELET
Abs Immature Granulocytes: 0.02 10*3/uL (ref 0.00–0.07)
Basophils Absolute: 0.1 10*3/uL (ref 0.0–0.1)
Basophils Relative: 1 %
Eosinophils Absolute: 0.2 10*3/uL (ref 0.0–0.5)
Eosinophils Relative: 3 %
HCT: 40.3 % (ref 36.0–46.0)
Hemoglobin: 13.3 g/dL (ref 12.0–15.0)
Immature Granulocytes: 0 %
Lymphocytes Relative: 24 %
Lymphs Abs: 1.7 10*3/uL (ref 0.7–4.0)
MCH: 27.9 pg (ref 26.0–34.0)
MCHC: 33 g/dL (ref 30.0–36.0)
MCV: 84.5 fL (ref 80.0–100.0)
Monocytes Absolute: 0.6 10*3/uL (ref 0.1–1.0)
Monocytes Relative: 8 %
Neutro Abs: 4.6 10*3/uL (ref 1.7–7.7)
Neutrophils Relative %: 64 %
Platelets: 345 10*3/uL (ref 150–400)
RBC: 4.77 MIL/uL (ref 3.87–5.11)
RDW: 13.6 % (ref 11.5–15.5)
WBC: 7.1 10*3/uL (ref 4.0–10.5)
nRBC: 0 % (ref 0.0–0.2)

## 2021-03-20 LAB — PROTIME-INR
INR: 1 (ref 0.8–1.2)
Prothrombin Time: 13 seconds (ref 11.4–15.2)

## 2021-03-20 MED ORDER — IOHEXOL 300 MG/ML  SOLN
100.0000 mL | Freq: Once | INTRAMUSCULAR | Status: AC | PRN
  Administered 2021-03-20: 50 mL via INTRA_ARTERIAL

## 2021-03-20 MED ORDER — SODIUM CHLORIDE 0.9 % IV SOLN: INTRAVENOUS | Status: AC

## 2021-03-20 MED ORDER — FENTANYL CITRATE (PF) 100 MCG/2ML IJ SOLN
INTRAMUSCULAR | Status: AC | PRN
  Administered 2021-03-20 (×2): 25 ug via INTRAVENOUS

## 2021-03-20 MED ORDER — MIDAZOLAM HCL 2 MG/2ML IJ SOLN
INTRAMUSCULAR | Status: AC | PRN
  Administered 2021-03-20: 1 mg via INTRAVENOUS

## 2021-03-20 MED ORDER — VERAPAMIL HCL 2.5 MG/ML IV SOLN
INTRAVENOUS | Status: AC | PRN
  Administered 2021-03-20: 2.5 mg via INTRA_ARTERIAL

## 2021-03-20 MED ORDER — HEPARIN SODIUM (PORCINE) 1000 UNIT/ML IJ SOLN
INTRAMUSCULAR | Status: AC
  Filled 2021-03-20: qty 1

## 2021-03-20 MED ORDER — SODIUM CHLORIDE 0.9 % IV SOLN: INTRAVENOUS | Status: DC

## 2021-03-20 MED ORDER — MIDAZOLAM HCL 2 MG/2ML IJ SOLN
INTRAMUSCULAR | Status: AC
  Filled 2021-03-20: qty 2

## 2021-03-20 MED ORDER — LIDOCAINE HCL 1 % IJ SOLN
INTRAMUSCULAR | Status: AC
  Filled 2021-03-20: qty 20

## 2021-03-20 MED ORDER — LIDOCAINE HCL (PF) 1 % IJ SOLN
INTRAMUSCULAR | Status: AC | PRN
  Administered 2021-03-20: 2 mL

## 2021-03-20 MED ORDER — IOHEXOL 300 MG/ML  SOLN
100.0000 mL | Freq: Once | INTRAMUSCULAR | Status: AC | PRN
  Administered 2021-03-20: 25 mL via INTRA_ARTERIAL

## 2021-03-20 MED ORDER — HEPARIN SODIUM (PORCINE) 1000 UNIT/ML IJ SOLN
INTRAMUSCULAR | Status: AC | PRN
  Administered 2021-03-20: 2000 [IU] via INTRAVENOUS

## 2021-03-20 MED ORDER — FENTANYL CITRATE (PF) 100 MCG/2ML IJ SOLN
INTRAMUSCULAR | Status: AC
  Filled 2021-03-20: qty 2

## 2021-03-20 MED ORDER — VERAPAMIL HCL 2.5 MG/ML IV SOLN
INTRAVENOUS | Status: AC
  Filled 2021-03-20: qty 2

## 2021-03-20 MED ORDER — SODIUM CHLORIDE (PF) 0.9 % IJ SOLN
INTRAVENOUS | Status: AC | PRN
  Administered 2021-03-20 (×4): 200 ug via INTRA_ARTERIAL

## 2021-03-20 MED ORDER — NITROGLYCERIN 1 MG/10 ML FOR IR/CATH LAB
INTRA_ARTERIAL | Status: AC
  Filled 2021-03-20: qty 10

## 2021-03-20 NOTE — Sedation Documentation (Signed)
Pt transported to room 6 via stretcher accompanied by RN and transporter. Molly RN at bedside to receive pt. Handoff completed. Right wrist and right groin remain level 0. Bilateral lower distal pulses palpable. Pt awake alert and oriented. No s/s of distress at this time.

## 2021-03-20 NOTE — Procedures (Signed)
S/P 4 vessel cerebral artrriogram RT Rad and RT CFA approach. Rad approach abandoned  due to loop at the elbow. Findings. 1.Approx 50 % intrastent stenosis in the mid basilar seg on AP projection ,and minimal on the lat projection.. 2.tight stenosis of LT PCA distal P2 region,filling via the Lt PCOM. Distal pulses dopplerable. S.Kimbly Eanes MD

## 2021-03-20 NOTE — H&P (Signed)
Chief Complaint: Patient was seen in consultation today for diagnostic cerebral angiogram  Referring Physician(s): Roland Rack, MD  Supervising Physician: Luanne Bras  Patient Status: [facility] - Out-pt  History of Present Illness: STEPHENIE Martinez is a 70 y.o. 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 Ms. Wexler was first seen by [facility] in January of 2021 for basilar artery stenosis and underwent stent assisted angioplasty of the basilar artery 07/24/19. She has been followed with regular imaging since that time, her most CTA head/neck which showed:  1. Status post angioplasty and stenting of the basilar artery with patent implant. 2. Multifocal areas of stenosis in the bilateral posterior cerebral arteries, consistent with intracranial atherosclerotic disease with progression of severe stenosis in the distal left P2/PCA. 3. Moderate stenosis at the origin of the left vertebral artery, which is dominant. 4. 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 Differential diagnosis includes indolent lymphoma. Suggest cardiothoracic surgical consultation and further evaluation with PET/CT.  Due to these recent findings she presents today for a diagnostic cerebral angiogram to further evaluate these areas of stenosis.  Past Medical History:  Diagnosis Date   Allergy    Arthritis    GERD (gastroesophageal reflux disease)    Hypertension    Hypothyroidism    Pneumonia    Stroke ([place])    right arm is "not working", speech a little slurred   Thyroid disease     Past Surgical History:  Procedure Laterality Date   ABDOMINAL HYSTERECTOMY     APPENDECTOMY     BREAST SURGERY Right    lumpectomy   CATARACT EXTRACTION      CHOLECYSTECTOMY     IR ANGIO INTRA EXTRACRAN SEL COM CAROTID INNOMINATE BILAT MOD SED  07/24/2019   IR ANGIO VERTEBRAL SEL SUBCLAVIAN INNOMINATE UNI L MOD SED  07/24/2019   IR INTRA CRAN STENT  07/24/2019   RADIOLOGY WITH ANESTHESIA N/A 07/09/2019   Procedure: RADIOLOGY WITH ANESTHESIA STENT PLACEMENT;  Surgeon: Luanne Bras, MD;  Location: [place];  Service: Radiology;  Laterality: N/A;   RADIOLOGY WITH ANESTHESIA N/A 07/23/2019   Procedure: RADIOLOGY WITH ANESTHESIA STENT PLACEMENT;  Surgeon: Luanne Bras, MD;  Location: [place];  Service: Radiology;  Laterality: N/A;   RADIOLOGY WITH ANESTHESIA N/A 07/24/2019   Procedure: RADIOLOGY WITH ANESTHESIA STENT PLACEMENT;  Surgeon: Luanne Bras, MD;  Location: [place];  Service: Radiology;  Laterality: N/A;   TUBAL LIGATION      Allergies: Latex, Statins, Amoxicillin, Codeine, and Penicillin g  Medications: Prior to Admission medications   Medication Sig Start Date End Date Taking? Authorizing Provider  amLODipine (NORVASC) 10 MG tablet TAKE ONE TABLET BY MOUTH ONE TIME DAILY 03/18/21  Yes Libby Maw, MD  aspirin EC 81 MG tablet Take 81 mg by mouth daily. Swallow whole.   Yes [provider]  atorvastatin (LIPITOR) 80 MG tablet TAKE 1 TABLET(80 MG) BY MOUTH DAILY AT 6 PM 12/23/20  Yes Libby Maw, MD  cloNIDine (CATAPRES) 0.2 MG tablet TAKE ONE TABLET BY MOUTH TWICE A DAY 02/04/21  Yes Libby Maw, MD  clopidogrel (PLAVIX) 75 MG tablet TAKE ONE TABLET BY MOUTH ONE TIME DAILY 12/12/20  Yes Libby Maw, MD  fluticasone [facility]) 50 MCG/ACT nasal spray Place 2 sprays into both  nostrils daily. Patient taking differently: Place 2 sprays into both nostrils daily as needed for allergies. 08/09/18  Yes [name], DO  levothyroxine (SYNTHROID) 137 MCG tablet TAKE ONE TABLET BY MOUTH ONE TIME DAILY AT 6AM. 03/18/21  Yes Libby Maw, MD  potassium chloride SA (KLOR-CON) 20 MEQ tablet Take  20 mEq by mouth daily.   Yes [provider]  acetaminophen (TYLENOL) 500 MG tablet Take 1,000 mg by mouth every 6 (six) hours as needed for moderate pain.    [provider]     Family History  Problem Relation Age of Onset   Breast cancer Neg Hx     Social History   Socioeconomic History   Marital status: Divorced    Spouse name: Not on file   Number of children: Not on file   Years of education: Not on file   Highest education level: Not on file  Occupational History   Not on file  Tobacco Use   Smoking status: Former    Types: Cigarettes    Quit date: 1970    Years since quitting: 52.8   Smokeless tobacco: Never  Vaping Use   Vaping Use: Never used  Substance and Sexual Activity   Alcohol use: Yes    Comment: occasional   Drug use: Never   Sexual activity: Not on file  Other Topics Concern   Not on file  Social History Narrative   Not on file   Social Determinants of Health   Financial Resource Strain: Not on file  Food Insecurity: Not on file  Transportation Needs: Not on file  Physical Activity: Not on file  Stress: Not on file  Social Connections: Not on file     Review of Systems: A 12 point ROS discussed and pertinent positives are indicated in the HPI above.  All other systems are negative.  Review of Systems  Constitutional:  Negative for chills and fever.  Respiratory:  Negative for cough and shortness of breath.   Cardiovascular:  Negative for chest pain.  Gastrointestinal:  Negative for abdominal pain, nausea and vomiting.  Musculoskeletal:  Negative for back pain.  Neurological:  Positive for weakness (right upper extremity - chronic since previous CVA). Negative for facial asymmetry, light-headedness, numbness and headaches.   Vital Signs: BP 140/80   Pulse 72   Temp 97.8 F (36.6 C) (Oral)   Resp 16   Ht 5\' 4"  (1.626 m)   Wt 151 lb (68.5 kg)   SpO2 97%   BMI 25.92 kg/m   Physical Exam Vitals reviewed.   Constitutional:      General: She is not in acute distress. HENT:     Head: Normocephalic.     Mouth/Throat:     Mouth: Mucous membranes are moist.     Pharynx: Oropharynx is clear. No oropharyngeal exudate or posterior oropharyngeal erythema.  Cardiovascular:     Rate and Rhythm: Normal rate and regular rhythm.  Pulmonary:     Effort: Pulmonary effort is normal.     Breath sounds: Normal breath sounds.  Abdominal:     General: There is no distension.     Palpations: Abdomen is soft.     Tenderness: There is no abdominal tenderness.  Skin:    General: Skin is warm and dry.  Neurological:     Mental Status: She is alert and oriented to person, place, and time.  Psychiatric:        Mood and Affect: Mood normal.  Behavior: Behavior normal.        Thought Content: Thought content normal.        Judgment: Judgment normal.     MD Evaluation Airway: WNL Heart: WNL Abdomen: WNL Chest/ Lungs: WNL ASA  Classification: 2 Mallampati/Airway Score: Two   Imaging: MM 3D SCREEN BREAST BILATERAL  Result Date: 03/03/2021 CLINICAL DATA:  Screening. EXAM: DIGITAL SCREENING BILATERAL MAMMOGRAM WITH TOMOSYNTHESIS AND CAD TECHNIQUE: Bilateral screening digital craniocaudal and mediolateral oblique mammograms were obtained. Bilateral screening digital breast tomosynthesis was performed. The images were evaluated with computer-aided detection. COMPARISON:  Previous exam(s). ACR Breast Density Category b: There are scattered areas of fibroglandular density. FINDINGS: There are no findings suspicious for malignancy. IMPRESSION: No mammographic evidence of malignancy. A result letter of this screening mammogram will be mailed directly to the patient. RECOMMENDATION: Screening mammogram in one year. (Code:SM-B-01Y) BI-RADS CATEGORY  1: Negative. Electronically Signed   By: Claudie Revering M.D.   On: 03/03/2021 12:56   DG MOBILE BONE DENSITY  Result Date: 02/27/2021 CLINICAL DATA:   Postmenopausal. EXAM: DUAL X-RAY ABSORPTIOMETRY (DXA) FOR BONE MINERAL DENSITY TECHNIQUE: Bone mineral density measurements are performed of the spine, hip, and forearm, as appropriate, per International Society of Clinical Densitometry recommendations. The pertinent regions of interest are reported below. Non-contributory values are not reported. Images are obtained for bone mineral density measurement and are not obtained for diagnostic purposes. FINDINGS: AP LUMBAR SPINE L1 through L4 Bone Mineral Density (BMD):  0.990 g/cm2 Young Adult T-Score:  -0.5 Z-Score:  1.6 LEFT FEMUR TOTAL Bone Mineral Density (BMD):  0.899 g/cm2 Young Adult T-Score: -0.4 Z-Score:  1.2 Unit: This study was performed at [facility] on the [place] (S/N [id number]), software version 13.4.2. Scan quality: The scan quality is good. Exclusions: None. ASSESSMENT: Patient's diagnostic category is NORMAL by WHO Criteria. FRACTURE RISK: NOT INCREASED FRAX:  Not assessed, all T-scores at or above -1.0. COMPARISON: None. RECOMMENDATIONS 1. All patients should optimize calcium and vitamin D intake. 2. Consider FDA-approved medical therapies in postmenopausal women and men aged 17 years and older, based on the following: - A hip or vertebral (clinical or morphometric) fracture - T-score less than or equal to -2.5 at the femoral neck or spine after appropriate evaluation to exclude secondary causes - Low bone mass (T-score between -1.0 and -2.5 at the femoral neck or spine) and a 10-year probability of a hip fracture greater than or equal to 3% or a 10-year probability of a major osteoporosis-related fracture greater than or equal to 20% based on the [place]-adapted WHO algorithm - Clinician judgment and/or patient preferences may indicate treatment for people with 10-year fracture probabilities above or below these levels 3. Patients with diagnosis of osteoporosis or at high risk for fracture should have regular bone mineral density tests.  For patients eligible for Medicare, routine testing is allowed once every 2 years. The testing frequency can be increased to one year for patients who have rapidly progressing disease, those who are receiving or discontinuing medical therapy to restore bone mass, or have additional risk factors. Electronically Signed   By: Lajean Manes M.D.   On: 02/27/2021 10:36    Labs:  CBC: Recent Labs    07/30/20 1047 01/28/21 0917 03/20/21 0650  WBC 6.9 7.4 7.1  HGB 12.8 13.4 13.3  HCT 37.4 40.6 40.3  PLT 393.0 326.0 345    COAGS: Recent Labs    03/20/21 0650  INR 1.0    BMP: Recent Labs  06/23/20 1113 07/30/20 1047 01/28/21 0917 03/20/21 0650  NA  --  143 140 139  K  --  4.0 3.8 3.4*  CL  --  104 101 105  CO2  --  31 27 26   GLUCOSE  --  108* 115* 132*  BUN  --  7 12 10   CALCIUM  --  9.6 9.6 9.5  CREATININE 0.70 0.79 0.80 0.81  GFRNONAA  --   --   --  >60    LIVER FUNCTION TESTS: Recent Labs    07/30/20 1047 01/28/21 0917  BILITOT 0.4 0.6  AST 11 15  ALT 9 15  ALKPHOS 59 72  PROT 6.8 7.0  ALBUMIN 4.1 4.4    TUMOR MARKERS: No results for input(s): AFPTM, CEA, CA199, CHROMGRNA in the last 8760 hours.  Assessment and Plan:  70 y/o F with history of basilar artery stenosis s/p stenting 07/24/19 who presents today for a diagnostic cerebral angiogram with moderate sedation to evaluate recent CTA findings.  Risks and benefits of diagnostic cerebral angiogram were discussed with the patient including, but not limited to bleeding, infection, vascular injury, stroke, or contrast induced renal failure.  This interventional procedure involves the use of X-rays and because of the nature of the planned procedure, it is possible that we will have prolonged use of X-ray fluoroscopy.  Potential radiation risks to you include (but are not limited to) the following: - A slightly elevated risk for cancer  several years later in life. This risk is typically less than 0.5% percent.  This risk is low in comparison to the normal incidence of human cancer, which is 33% for women and 50% for men according to the [employer]. - Radiation induced injury can include skin redness, resembling a rash, tissue breakdown / ulcers and hair loss (which can be temporary or permanent).  The likelihood of either of these occurring depends on the difficulty of the procedure and whether you are sensitive to radiation due to previous procedures, disease, or genetic conditions.  IF your procedure requires a prolonged use of radiation, you will be notified and given written instructions for further action.  It is your responsibility to monitor the irradiated area for the 2 weeks following the procedure and to notify your physician if you are concerned that you have suffered a radiation induced injury.    All of the patient's questions were answered, patient is agreeable to proceed.  Consent signed and in chart.  Thank you for this interesting consult.  I greatly enjoyed meeting ZAYRA DEVITO and look forward to participating in their care.  A copy of this report was sent to the requesting provider on this date.  Electronically Signed: Joaquim Nam, PA-C 03/20/2021, 7:47 AM   I spent a total of 25 Minutes in face to face in clinical consultation, greater than 50% of which was counseling/coordinating care for diagnostic cerebral angiogram.

## 2021-04-01 ENCOUNTER — Other Ambulatory Visit: Payer: Self-pay

## 2021-04-01 ENCOUNTER — Ambulatory Visit (INDEPENDENT_AMBULATORY_CARE_PROVIDER_SITE_OTHER): Payer: PPO

## 2021-04-01 VITALS — BP 134/68 | HR 72 | Temp 98.0°F | Ht 64.0 in | Wt 161.0 lb

## 2021-04-01 DIAGNOSIS — Z0000 Encounter for general adult medical examination without abnormal findings: Secondary | ICD-10-CM

## 2021-04-01 DIAGNOSIS — E1165 Type 2 diabetes mellitus with hyperglycemia: Secondary | ICD-10-CM | POA: Diagnosis not present

## 2021-04-01 DIAGNOSIS — I10 Essential (primary) hypertension: Secondary | ICD-10-CM

## 2021-04-01 LAB — URINALYSIS, ROUTINE W REFLEX MICROSCOPIC
Bilirubin Urine: NEGATIVE
Hgb urine dipstick: NEGATIVE
Ketones, ur: NEGATIVE
Nitrite: NEGATIVE
RBC / HPF: NONE SEEN (ref 0–?)
Specific Gravity, Urine: 1.01 (ref 1.000–1.030)
Total Protein, Urine: NEGATIVE
Urine Glucose: NEGATIVE
Urobilinogen, UA: 0.2 (ref 0.0–1.0)
pH: 7 (ref 5.0–8.0)

## 2021-04-01 LAB — MICROALBUMIN / CREATININE URINE RATIO
Creatinine,U: 63.2 mg/dL
Microalb Creat Ratio: 1.1 mg/g (ref 0.0–30.0)
Microalb, Ur: <0.7 mg/dL (ref 0.0–1.9)

## 2021-04-01 NOTE — Patient Instructions (Signed)
Megan Martinez , Thank you for taking time to come for your Medicare Wellness Visit. I appreciate your ongoing commitment to your health goals. Please review the following plan we discussed and let me know if I can assist you in the future.   Screening recommendations/referrals: Colonoscopy: declines  Mammogram: 02/26/2021 Bone Density: 02/26/2021 Recommended yearly ophthalmology/optometry visit for glaucoma screening and checkup Recommended yearly dental visit for hygiene and checkup  Vaccinations: Influenza vaccine: completed  Pneumococcal vaccine: completed  Tdap vaccine: due with injury  Shingles vaccine: will consider     Advanced directives: will provide copies   Conditions/risks identified: none   Next appointment: none    Preventive Care 8 Years and Older, Female Preventive care refers to lifestyle choices and visits with your health care provider that can promote health and wellness. What does preventive care include? A yearly physical exam. This is also called an annual well check. Dental exams once or twice a year. Routine eye exams. Ask your health care provider how often you should have your eyes checked. Personal lifestyle choices, including: Daily care of your teeth and gums. Regular physical activity. Eating a healthy diet. Avoiding tobacco and drug use. Limiting alcohol use. Practicing safe sex. Taking low-dose aspirin every day. Taking vitamin and mineral supplements as recommended by your health care provider. What happens during an annual well check? The services and screenings done by your health care provider during your annual well check will depend on your age, overall health, lifestyle risk factors, and family history of disease. Counseling  Your health care provider may ask you questions about your: Alcohol use. Tobacco use. Drug use. Emotional well-being. Home and relationship well-being. Sexual activity. Eating habits. History of  falls. Memory and ability to understand (cognition). Work and work Statistician. Reproductive health. Screening  You may have the following tests or measurements: Height, weight, and BMI. Blood pressure. Lipid and cholesterol levels. These may be checked every 5 years, or more frequently if you are over 5 years old. Skin check. Lung cancer screening. You may have this screening every year starting at age 18 if you have a 30-pack-year history of smoking and currently smoke or have quit within the past 15 years. Fecal occult blood test (FOBT) of the stool. You may have this test every year starting at age 48. Flexible sigmoidoscopy or colonoscopy. You may have a sigmoidoscopy every 5 years or a colonoscopy every 10 years starting at age 27. Hepatitis C blood test. Hepatitis B blood test. Sexually transmitted disease (STD) testing. Diabetes screening. This is done by checking your blood sugar (glucose) after you have not eaten for a while (fasting). You may have this done every 1-3 years. Bone density scan. This is done to screen for osteoporosis. You may have this done starting at age 65. Mammogram. This may be done every 1-2 years. Talk to your health care provider about how often you should have regular mammograms. Talk with your health care provider about your test results, treatment options, and if necessary, the need for more tests. Vaccines  Your health care provider may recommend certain vaccines, such as: Influenza vaccine. This is recommended every year. Tetanus, diphtheria, and acellular pertussis (Tdap, Td) vaccine. You may need a Td booster every 10 years. Zoster vaccine. You may need this after age 57. Pneumococcal 13-valent conjugate (PCV13) vaccine. One dose is recommended after age 64. Pneumococcal polysaccharide (PPSV23) vaccine. One dose is recommended after age 41. Talk to your health care provider about which screenings and  vaccines you need and how often you need  them. This information is not intended to replace advice given to you by your health care provider. Make sure you discuss any questions you have with your health care provider. Document Released: 06/06/2015 Document Revised: 01/28/2016 Document Reviewed: 03/11/2015 Elsevier Interactive Patient Education  2017 [place] Prevention in the Home Falls can cause injuries. They can happen to people of all ages. There are many things you can do to make your home safe and to help prevent falls. What can I do on the outside of my home? Regularly fix the edges of walkways and driveways and fix any cracks. Remove anything that might make you trip as you walk through a door, such as a raised step or threshold. Trim any bushes or trees on the path to your home. Use bright outdoor lighting. Clear any walking paths of anything that might make someone trip, such as rocks or tools. Regularly check to see if handrails are loose or broken. Make sure that both sides of any steps have handrails. Any raised decks and porches should have guardrails on the edges. Have any leaves, snow, or ice cleared regularly. Use sand or salt on walking paths during winter. Clean up any spills in your garage right away. This includes oil or grease spills. What can I do in the bathroom? Use night lights. Install grab bars by the toilet and in the tub and shower. Do not use towel bars as grab bars. Use non-skid mats or decals in the tub or shower. If you need to sit down in the shower, use a plastic, non-slip stool. Keep the floor dry. Clean up any water that spills on the floor as soon as it happens. Remove soap buildup in the tub or shower regularly. Attach bath mats securely with double-sided non-slip rug tape. Do not have throw rugs and other things on the floor that can make you trip. What can I do in the bedroom? Use night lights. Make sure that you have a light by your bed that is easy to reach. Do not use  any sheets or blankets that are too big for your bed. They should not hang down onto the floor. Have a firm chair that has side arms. You can use this for support while you get dressed. Do not have throw rugs and other things on the floor that can make you trip. What can I do in the kitchen? Clean up any spills right away. Avoid walking on wet floors. Keep items that you use a lot in easy-to-reach places. If you need to reach something above you, use a strong step stool that has a grab bar. Keep electrical cords out of the way. Do not use floor polish or wax that makes floors slippery. If you must use wax, use non-skid floor wax. Do not have throw rugs and other things on the floor that can make you trip. What can I do with my stairs? Do not leave any items on the stairs. Make sure that there are handrails on both sides of the stairs and use them. Fix handrails that are broken or loose. Make sure that handrails are as long as the stairways. Check any carpeting to make sure that it is firmly attached to the stairs. Fix any carpet that is loose or worn. Avoid having throw rugs at the top or bottom of the stairs. If you do have throw rugs, attach them to the floor with carpet tape. Make  sure that you have a light switch at the top of the stairs and the bottom of the stairs. If you do not have them, ask someone to add them for you. What else can I do to help prevent falls? Wear shoes that: Do not have high heels. Have rubber bottoms. Are comfortable and fit you well. Are closed at the toe. Do not wear sandals. If you use a stepladder: Make sure that it is fully opened. Do not climb a closed stepladder. Make sure that both sides of the stepladder are locked into place. Ask someone to hold it for you, if possible. Clearly mark and make sure that you can see: Any grab bars or handrails. First and last steps. Where the edge of each step is. Use tools that help you move around (mobility aids)  if they are needed. These include: Canes. Walkers. Scooters. Crutches. Turn on the lights when you go into a dark area. Replace any light bulbs as soon as they burn out. Set up your furniture so you have a clear path. Avoid moving your furniture around. If any of your floors are uneven, fix them. If there are any pets around you, be aware of where they are. Review your medicines with your doctor. Some medicines can make you feel dizzy. This can increase your chance of falling. Ask your doctor what other things that you can do to help prevent falls. This information is not intended to replace advice given to you by your health care provider. Make sure you discuss any questions you have with your health care provider. Document Released: 03/06/2009 Document Revised: 10/16/2015 Document Reviewed: 06/14/2014 Elsevier Interactive Patient Education  2017 [name].

## 2021-04-01 NOTE — Addendum Note (Signed)
Addended by: Lynnea Ferrier on: 04/01/2021 02:38 PM   Modules accepted: Orders

## 2021-04-01 NOTE — Progress Notes (Signed)
Subjective:   Megan Martinez is a 70 y.o. female who presents for an Initial Medicare Annual Wellness Visit.  Review of Systems     Cardiac Risk Factors include: advanced age (>56men, >78 women);dyslipidemia;hypertension;diabetes mellitus     Objective:    Today's Vitals   04/01/21 0856  BP: 134/68  Pulse: 72  Temp: 98 F (36.7 C)  SpO2: 96%  Weight: 161 lb (73 kg)  Height: 5\' 4"  (1.626 m)   Body mass index is 27.64 kg/m.  Advanced Directives 04/01/2021 03/20/2021 09/17/2019 07/24/2019 07/23/2019 07/09/2019 06/20/2019  Does Patient Have a Medical Advance Directive? Yes Yes No Yes Yes Yes Yes  Type of Paramedic of Force;Living will [place];Living will - Living will Living will Living will Living will  Does patient want to make changes to medical advance directive? - No - Patient declined - No - Patient declined - No - Guardian declined -  Copy of [place] in Chart? No - copy requested No - copy requested - No - copy requested No - copy requested No - copy requested -  Would patient like information on creating a medical advance directive? - - No - Patient declined - - - -    Current Medications (verified) Outpatient Encounter Medications as of 04/01/2021  Medication Sig   acetaminophen (TYLENOL) 500 MG tablet Take 1,000 mg by mouth every 6 (six) hours as needed for moderate pain.   amLODipine (NORVASC) 10 MG tablet TAKE ONE TABLET BY MOUTH ONE TIME DAILY   aspirin EC 81 MG tablet Take 81 mg by mouth daily. Swallow whole.   atorvastatin (LIPITOR) 80 MG tablet TAKE 1 TABLET(80 MG) BY MOUTH DAILY AT 6 PM   cloNIDine (CATAPRES) 0.2 MG tablet TAKE ONE TABLET BY MOUTH TWICE A DAY   clopidogrel (PLAVIX) 75 MG tablet TAKE ONE TABLET BY MOUTH ONE TIME DAILY   fluticasone (FLONASE) 50 MCG/ACT nasal spray Place 2 sprays into both nostrils daily. (Patient taking differently: Place 2 sprays into both nostrils daily as needed  for allergies.)   levothyroxine (SYNTHROID) 137 MCG tablet TAKE ONE TABLET BY MOUTH ONE TIME DAILY AT 6AM.   potassium chloride SA (KLOR-CON) 20 MEQ tablet Take 20 mEq by mouth daily.   Facility-Administered Encounter Medications as of 04/01/2021  Medication   midazolam (VERSED) 5 MG/5ML injection    Allergies (verified) Latex, Statins, Amoxicillin, Codeine, and Penicillin g   History: Past Medical History:  Diagnosis Date   Allergy    Arthritis    GERD (gastroesophageal reflux disease)    Hypertension    Hypothyroidism    Pneumonia    Stroke ([place])    right arm is "not working", speech a little slurred   Thyroid disease    Past Surgical History:  Procedure Laterality Date   ABDOMINAL HYSTERECTOMY     APPENDECTOMY     BREAST SURGERY Right    lumpectomy   CATARACT EXTRACTION     CHOLECYSTECTOMY     IR ANGIO INTRA EXTRACRAN SEL COM CAROTID INNOMINATE BILAT MOD SED  07/24/2019   IR ANGIO INTRA EXTRACRAN SEL COM CAROTID INNOMINATE BILAT MOD SED  03/20/2021   IR ANGIO VERTEBRAL SEL SUBCLAVIAN INNOMINATE UNI L MOD SED  07/24/2019   IR ANGIO VERTEBRAL SEL VERTEBRAL UNI L MOD SED  03/20/2021   IR INTRA CRAN STENT  07/24/2019   RADIOLOGY WITH ANESTHESIA N/A 07/09/2019   Procedure: RADIOLOGY WITH ANESTHESIA STENT PLACEMENT;  Surgeon: Estanislado Pandy,  Willaim Rayas, MD;  Location: [facility];  Service: Radiology;  Laterality: N/A;   RADIOLOGY WITH ANESTHESIA N/A 07/23/2019   Procedure: RADIOLOGY WITH ANESTHESIA STENT PLACEMENT;  Surgeon: Luanne Bras, MD;  Location: [facility];  Service: Radiology;  Laterality: N/A;   RADIOLOGY WITH ANESTHESIA N/A 07/24/2019   Procedure: RADIOLOGY WITH ANESTHESIA STENT PLACEMENT;  Surgeon: Luanne Bras, MD;  Location: [facility];  Service: Radiology;  Laterality: N/A;   TUBAL LIGATION     Family History  Problem Relation Age of Onset   Breast cancer Neg Hx    Social History   Socioeconomic History   Marital status: Divorced    Spouse name: Not on file   Number of  children: Not on file   Years of education: Not on file   Highest education level: Not on file  Occupational History   Not on file  Tobacco Use   Smoking status: Former    Types: Cigarettes    Quit date: 1970    Years since quitting: 52.8   Smokeless tobacco: Never  Vaping Use   Vaping Use: Never used  Substance and Sexual Activity   Alcohol use: Yes    Comment: occasional   Drug use: Never   Sexual activity: Not on file  Other Topics Concern   Not on file  Social History Narrative   Not on file   Social Determinants of Health   Financial Resource Strain: Low Risk    Difficulty of Paying Living Expenses: Not hard at all  Food Insecurity: No Food Insecurity   Worried About Charity fundraiser in the Last Year: Never true   [place] in the Last Year: Never true  Transportation Needs: No Transportation Needs   Lack of Transportation (Medical): No   Lack of Transportation (Non-Medical): No  Physical Activity: Sufficiently Active   Days of Exercise per Week: 4 days   Minutes of Exercise per Session: 40 min  Stress: No Stress Concern Present   Feeling of Stress : Not at all  Social Connections: Socially Isolated   Frequency of Communication with Friends and Family: Twice a week   Frequency of Social Gatherings with Friends and Family: Twice a week   Attends Religious Services: Never   Printmaker: No   Attends Music therapist: Never   Marital Status: Divorced    Tobacco Counseling Counseling given: Not Answered   Clinical Intake:  Pre-visit preparation completed: Yes  Pain : No/denies pain     Nutritional Risks: None Diabetes: No  How often do you need to have someone help you when you read instructions, pamphlets, or other written materials from your doctor or pharmacy?: 1 - Never What is the last grade level you completed in school?: College   Information entered by :: L.Josian Lanese,LPN   Activities of Daily  Living In your present state of health, do you have any difficulty performing the following activities: 04/01/2021 04/01/2021  Hearing? N N  Vision? N N  Difficulty concentrating or making decisions? N N  Walking or climbing stairs? N N  Dressing or bathing? - N  Doing errands, shopping? N N  Preparing Food and eating ? N N  Using the Toilet? N N  In the past six months, have you accidently leaked urine? N N  Do you have problems with loss of bowel control? N N  Managing your Medications? N N  Managing your Finances? N N  Housekeeping or managing your  Housekeeping? N N  Some recent data might be hidden    Patient Care Team: Libby Maw, MD as PCP - General (Family Medicine)  Indicate any recent Medical Services you may have received from other than [facility] providers in the past year (date may be approximate).     Assessment:   This is a routine wellness examination for Sakoya.  Hearing/Vision screen Vision Screening - Comments:: Annual eye exams wears glasses   Dietary issues and exercise activities discussed: Current Exercise Habits: Home exercise routine, Type of exercise: walking, Time (Minutes): 40, Frequency (Times/Week): 4, Weekly Exercise (Minutes/Week): 160, Intensity: Mild, Exercise limited by: None identified   Goals Addressed             This Visit's Progress    Exercise 3x per week (30 min per time)         Depression Screen PHQ 2/9 Scores 04/01/2021 04/01/2021 01/28/2021 10/16/2020 11/19/2019  PHQ - 2 Score 0 0 0 0 0    Fall Risk Fall Risk  04/01/2021 01/28/2021 10/16/2020 11/19/2019 08/16/2019  Falls in the past year? 0 0 0 1 0  Number falls in past yr: 0 - - 0 -  Injury with Fall? 0 - - 0 -  Comment - - - bruised -  Risk for fall due to : - - - - -  Follow up Falls evaluation completed - - - -    FALL RISK PREVENTION PERTAINING TO THE HOME:  Any stairs in or around the home? No  If so, are there any without handrails? No  Home free of loose  throw rugs in walkways, pet beds, electrical cords, etc? Yes  Adequate lighting in your home to reduce risk of falls? Yes   ASSISTIVE DEVICES UTILIZED TO PREVENT FALLS:  Life alert? No  Use of a cane, walker or w/c? No  Grab bars in the bathroom? No  Shower chair or bench in shower? Yes  Elevated toilet seat or a handicapped toilet? No   TIMED UP AND GO:  Was the test performed? Yes .  Length of time to ambulate 10 feet: 8 sec.   Gait steady and fast without use of assistive device  Cognitive Function:  Normal cognitive status assessed by direct observation by this Nurse Health Advisor. No abnormalities found.        Immunizations Immunization History  Administered Date(s) Administered   Fluad Quad(high Dose 65+) 02/13/2019   Influenza, High Dose Seasonal PF 02/23/2018, 01/28/2021   Influenza-Unspecified 05/26/2015   Moderna Sars-Covid-2 Vaccination 11/01/2019, 11/29/2019   Pneumococcal Conjugate-13 03/03/2016   Pneumococcal Polysaccharide-23 03/17/2017    TDAP status: Due, Education has been provided regarding the importance of this vaccine. Advised may receive this vaccine at local pharmacy or Health Dept. Aware to provide a copy of the vaccination record if obtained from local pharmacy or Health Dept. Verbalized acceptance and understanding.  Flu Vaccine status: Up to date  Pneumococcal vaccine status: Up to date  Covid-19 vaccine status: Completed vaccines  Qualifies for Shingles Vaccine? Yes   Zostavax completed No   Shingrix Completed?: No.    Education has been provided regarding the importance of this vaccine. Patient has been advised to call insurance company to determine out of pocket expense if they have not yet received this vaccine. Advised may also receive vaccine at local pharmacy or Health Dept. Verbalized acceptance and understanding.  Screening Tests Health Maintenance  Topic Date Due   OPHTHALMOLOGY EXAM  Never done   Hepatitis  C Screening  Never  done   TETANUS/TDAP  Never done   Zoster Vaccines- Shingrix (1 of 2) Never done   COLONOSCOPY (Pts 45-91yrs Insurance coverage will need to be confirmed)  Never done   DEXA SCAN  Never done   HEMOGLOBIN A1C  07/28/2021   FOOT EXAM  01/28/2022   MAMMOGRAM  02/26/2022   Pneumonia Vaccine 9+ Years old  Completed   INFLUENZA VACCINE  Completed   HPV VACCINES  Aged Out   COVID-19 Vaccine  Discontinued    Health Maintenance  Health Maintenance Due  Topic Date Due   OPHTHALMOLOGY EXAM  Never done   Hepatitis C Screening  Never done   TETANUS/TDAP  Never done   Zoster Vaccines- Shingrix (1 of 2) Never done   COLONOSCOPY (Pts 45-71yrs Insurance coverage will need to be confirmed)  Never done   DEXA SCAN  Never done     Colorectal cancer screening: Referral to GI placed patient declines . Pt aware the office will call re: appt.  Mammogram status: Completed 02/26/2021. Repeat every year  Bone Density status: Completed 02/26/2021. Results reflect: Bone density results: NORMAL. Repeat every 5 years.  Lung Cancer Screening: (Low Dose CT Chest recommended if Age 21-80 years, 30 pack-year currently smoking OR have quit w/in 15years.) does not qualify.   Lung Cancer Screening Referral: n/a  Additional Screening:  Hepatitis C Screening: does qualify;   Vision Screening: Recommended annual ophthalmology exams for early detection of glaucoma and other disorders of the eye. Is the patient up to date with their annual eye exam?  Yes  Who is the provider or what is the name of the office in which the patient attends annual eye exams? Dr.Shipro  If pt is not established with a provider, would they like to be referred to a provider to establish care? No .   Dental Screening: Recommended annual dental exams for proper oral hygiene  Community Resource Referral / Chronic Care Management: CRR required this visit?  No   CCM required this visit?  No      Plan:     I have personally  reviewed and noted the following in the patient's chart:   Medical and social history Use of alcohol, tobacco or illicit drugs  Current medications and supplements including opioid prescriptions. Patient is not currently taking opioid prescriptions. Functional ability and status Nutritional status Physical activity Advanced directives List of other physicians Hospitalizations, surgeries, and ER visits in previous 12 months Vitals Screenings to include cognitive, depression, and falls Referrals and appointments  In addition, I have reviewed and discussed with patient certain preventive protocols, quality metrics, and best practice recommendations. A written personalized care plan for preventive services as well as general preventive health recommendations were provided to patient.     Randel Pigg, LPN   09/23/9765   Nurse Notes: none

## 2021-04-14 ENCOUNTER — Other Ambulatory Visit: Payer: Self-pay | Admitting: Family Medicine

## 2021-04-20 ENCOUNTER — Other Ambulatory Visit: Payer: Self-pay | Admitting: Family Medicine

## 2021-04-20 DIAGNOSIS — I10 Essential (primary) hypertension: Secondary | ICD-10-CM

## 2021-04-29 ENCOUNTER — Encounter: Payer: Self-pay | Admitting: Family Medicine

## 2021-04-29 ENCOUNTER — Ambulatory Visit (INDEPENDENT_AMBULATORY_CARE_PROVIDER_SITE_OTHER): Payer: PPO | Admitting: Family Medicine

## 2021-04-29 ENCOUNTER — Other Ambulatory Visit: Payer: Self-pay

## 2021-04-29 VITALS — BP 140/74 | HR 62 | Temp 97.5°F | Ht 64.0 in | Wt 163.2 lb

## 2021-04-29 DIAGNOSIS — E1165 Type 2 diabetes mellitus with hyperglycemia: Secondary | ICD-10-CM | POA: Diagnosis not present

## 2021-04-29 DIAGNOSIS — E876 Hypokalemia: Secondary | ICD-10-CM | POA: Diagnosis not present

## 2021-04-29 DIAGNOSIS — I6322 Cerebral infarction due to unspecified occlusion or stenosis of basilar arteries: Secondary | ICD-10-CM

## 2021-04-29 DIAGNOSIS — I10 Essential (primary) hypertension: Secondary | ICD-10-CM | POA: Diagnosis not present

## 2021-04-29 DIAGNOSIS — N182 Chronic kidney disease, stage 2 (mild): Secondary | ICD-10-CM | POA: Diagnosis not present

## 2021-04-29 DIAGNOSIS — E7800 Pure hypercholesterolemia, unspecified: Secondary | ICD-10-CM

## 2021-04-29 DIAGNOSIS — Z1159 Encounter for screening for other viral diseases: Secondary | ICD-10-CM | POA: Insufficient documentation

## 2021-04-29 LAB — BASIC METABOLIC PANEL
BUN: 13 mg/dL (ref 6–23)
CO2: 34 mEq/L — ABNORMAL HIGH (ref 19–32)
Calcium: 9.8 mg/dL (ref 8.4–10.5)
Chloride: 96 mEq/L (ref 96–112)
Creatinine, Ser: 0.74 mg/dL (ref 0.40–1.20)
GFR: 81.78 mL/min (ref >60.00)
Glucose, Bld: 124 mg/dL — ABNORMAL HIGH (ref 70–99)
Potassium: 3.4 mEq/L — ABNORMAL LOW (ref 3.5–5.1)
Sodium: 139 mEq/L (ref 135–145)

## 2021-04-29 LAB — LIPID PANEL
Cholesterol: 132 mg/dL (ref 0–200)
HDL: 45 mg/dL (ref >39.00)
LDL Cholesterol: 59 mg/dL (ref 0–99)
NonHDL: 87.38
Total CHOL/HDL Ratio: 3
Triglycerides: 142 mg/dL (ref 0.0–149.0)
VLDL: 28.4 mg/dL (ref 0.0–40.0)

## 2021-04-29 LAB — HEMOGLOBIN A1C: Hgb A1c MFr Bld: 6.8 % — ABNORMAL HIGH (ref 4.6–6.5)

## 2021-04-29 NOTE — Progress Notes (Signed)
Established Patient Office Visit  Subjective:  Patient ID: Megan Martinez, female    DOB: [birth year]  Age: 70 y.o. MRN: [id number]  CC:  Chief Complaint  Patient presents with   Follow-up    3 month follow up, no concerns. Patient fasting.     HPI CALEN POSCH presents for follow-up of hypertension, controlled type 2 diabetes, elevated cholesterol, hypokalemia and CKD.  Has been doing well.  Has caught up on a lot of her health maintenance with normal mammograms and bone density amatory testing.  Still dealing with the affected of her recent stroke with regarding the perceived weakness in her right hand.  She is right-hand dominant.  She sometimes drops things unintentionally.  Past Medical History:  Diagnosis Date   Allergy    Arthritis    GERD (gastroesophageal reflux disease)    Hypertension    Hypothyroidism    Pneumonia    Stroke ([place])    right arm is "not working", speech a little slurred   Thyroid disease     Past Surgical History:  Procedure Laterality Date   ABDOMINAL HYSTERECTOMY     APPENDECTOMY     BREAST SURGERY Right    lumpectomy   CATARACT EXTRACTION     CHOLECYSTECTOMY     IR ANGIO INTRA EXTRACRAN SEL COM CAROTID INNOMINATE BILAT MOD SED  07/24/2019   IR ANGIO INTRA EXTRACRAN SEL COM CAROTID INNOMINATE BILAT MOD SED  03/20/2021   IR ANGIO VERTEBRAL SEL SUBCLAVIAN INNOMINATE UNI L MOD SED  07/24/2019   IR ANGIO VERTEBRAL SEL VERTEBRAL UNI L MOD SED  03/20/2021   IR INTRA CRAN STENT  07/24/2019   RADIOLOGY WITH ANESTHESIA N/A 07/09/2019   Procedure: RADIOLOGY WITH ANESTHESIA STENT PLACEMENT;  Surgeon: Luanne Bras, MD;  Location: [facility] [facility];  Service: Radiology;  Laterality: N/A;   RADIOLOGY WITH ANESTHESIA N/A 07/23/2019   Procedure: RADIOLOGY WITH ANESTHESIA STENT PLACEMENT;  Surgeon: Luanne Bras, MD;  Location: [facility];  Service: Radiology;  Laterality: N/A;   RADIOLOGY WITH ANESTHESIA N/A 07/24/2019   Procedure: RADIOLOGY WITH ANESTHESIA STENT  PLACEMENT;  Surgeon: Luanne Bras, MD;  Location: [place] [facility];  Service: Radiology;  Laterality: N/A;   TUBAL LIGATION      Family History  Problem Relation Age of Onset   Breast cancer Neg Hx     Social History   Socioeconomic History   Marital status: Divorced    Spouse name: Not on file   Number of children: Not on file   Years of education: Not on file   Highest education level: Not on file  Occupational History   Not on file  Tobacco Use   Smoking status: Former    Types: Cigarettes    Quit date: 1970    Years since quitting: 52.9   Smokeless tobacco: Never  Vaping Use   Vaping Use: Never used  Substance and Sexual Activity   Alcohol use: Yes    Comment: occasional   Drug use: Never   Sexual activity: Not on file  Other Topics Concern   Not on file  Social History Narrative   Not on file   Social Determinants of Health   Financial Resource Strain: Low Risk    Difficulty of Paying Living Expenses: Not hard at all  Food Insecurity: No Food Insecurity   Worried About Charity fundraiser in the Last Year: Never true   [place] in the Last Year: Never true  Transportation Needs: No  Transportation Needs   Lack of Transportation (Medical): No   Lack of Transportation (Non-Medical): No  Physical Activity: Sufficiently Active   Days of Exercise per Week: 4 days   Minutes of Exercise per Session: 40 min  Stress: No Stress Concern Present   Feeling of Stress : Not at all  Social Connections: Socially Isolated   Frequency of Communication with Friends and Family: Twice a week   Frequency of Social Gatherings with Friends and Family: Twice a week   Attends Religious Services: Never   Marine scientist or Organizations: No   Attends Music therapist: Never   Marital Status: Divorced  Human resources officer Violence: Not At Risk   Fear of Current or Ex-Partner: No   Emotionally Abused: No   Physically Abused: No   Sexually Abused: No     Outpatient Medications Prior to Visit  Medication Sig Dispense Refill   acetaminophen (TYLENOL) 500 MG tablet Take 1,000 mg by mouth every 6 (six) hours as needed for moderate pain.     amLODipine (NORVASC) 10 MG tablet TAKE ONE TABLET BY MOUTH ONE TIME DAILY 90 tablet 1   aspirin EC 81 MG tablet Take 81 mg by mouth daily. Swallow whole.     atorvastatin (LIPITOR) 80 MG tablet TAKE 1 TABLET(80 MG) BY MOUTH DAILY AT 6 PM 90 tablet 1   cloNIDine (CATAPRES) 0.2 MG tablet TAKE ONE TABLET BY MOUTH TWICE A DAY 60 tablet 2   clopidogrel (PLAVIX) 75 MG tablet TAKE ONE TABLET BY MOUTH ONE TIME DAILY 30 tablet 3   fluticasone (FLONASE) 50 MCG/ACT nasal spray Place 2 sprays into both nostrils daily. (Patient taking differently: Place 2 sprays into both nostrils daily as needed for allergies.) 16 g 6   levothyroxine (SYNTHROID) 137 MCG tablet TAKE ONE TABLET BY MOUTH ONE TIME DAILY AT 6AM. 90 tablet 3   potassium chloride SA (KLOR-CON) 20 MEQ tablet Take 20 mEq by mouth daily.     Facility-Administered Medications Prior to Visit  Medication Dose Route Frequency Provider Last Rate Last Admin   midazolam (VERSED) 5 MG/5ML injection   Intravenous Anesthesia Intra-op Mariea Clonts, CRNA   1 mg at 07/09/19 0845    Allergies  Allergen Reactions   Latex Itching and Rash   Statins Other (See Comments)    Myalgias, "sick". Tolerates Atorvastatin     Amoxicillin Rash    Did it involve swelling of the face/tongue/throat, SOB, or low BP? No Did it involve sudden or severe rash/hives, skin peeling, or any reaction on the inside of your mouth or nose? Yes Did you need to seek medical attention at a hospital or doctor's office? N/A When did it last happen? Child     If all above answers are "NO", may proceed with cephalosporin use.   Codeine Nausea Only   Penicillin G Rash    Did it involve swelling of the face/tongue/throat, SOB, or low BP? No Did it involve sudden or severe rash/hives, skin  peeling, or any reaction on the inside of your mouth or nose? Yes Did you need to seek medical attention at a hospital or doctor's office? N/A When did it last happen? Child If all above answers are "NO", may proceed with cephalosporin use.    ROS Review of Systems  Constitutional:  Negative for diaphoresis, fatigue, fever and unexpected weight change.  HENT: Negative.    Eyes:  Negative for photophobia and visual disturbance.  Respiratory: Negative.    Cardiovascular:  Negative.   Gastrointestinal: Negative.   Endocrine: Negative for polyphagia and polyuria.  Genitourinary: Negative.   Musculoskeletal:  Negative for joint swelling.  Neurological:  Positive for weakness. Negative for tremors.  Psychiatric/Behavioral: Negative.       Objective:    Physical Exam Vitals and nursing note reviewed.  Constitutional:      Appearance: Normal appearance.  HENT:     Head: Normocephalic and atraumatic.     Right Ear: External ear normal.     Left Ear: External ear normal.     Mouth/Throat:     Mouth: Mucous membranes are moist.     Pharynx: Oropharynx is clear. No oropharyngeal exudate or posterior oropharyngeal erythema.  Eyes:     General: No scleral icterus.       Right eye: No discharge.        Left eye: No discharge.     Extraocular Movements: Extraocular movements intact.     Conjunctiva/sclera: Conjunctivae normal.     Pupils: Pupils are equal, round, and reactive to light.  Neck:     Vascular: No carotid bruit.  Cardiovascular:     Rate and Rhythm: Normal rate and regular rhythm.  Pulmonary:     Effort: Pulmonary effort is normal.     Breath sounds: Normal breath sounds.  Abdominal:     General: Bowel sounds are normal.  Musculoskeletal:     Right hand: No deformity, tenderness or bony tenderness. Normal range of motion. Normal strength. Normal pulse.       Arms:     Cervical back: No rigidity or tenderness.     Right lower leg: No edema.     Left lower leg: No  edema.  Lymphadenopathy:     Cervical: No cervical adenopathy.  Skin:    General: Skin is warm and dry.  Neurological:     Mental Status: She is alert and oriented to person, place, and time.  Psychiatric:        Mood and Affect: Mood normal.        Behavior: Behavior normal.    BP 140/74 (BP Location: Left Arm, Patient Position: Sitting, Cuff Size: Normal)   Pulse 62   Temp (!) 97.5 F (36.4 C) (Temporal)   Ht 5\' 4"  (1.626 m)   Wt 163 lb 3.2 oz (74 kg)   SpO2 96%   BMI 28.01 kg/m  Wt Readings from Last 3 Encounters:  04/29/21 163 lb 3.2 oz (74 kg)  04/01/21 161 lb (73 kg)  03/20/21 151 lb (68.5 kg)     Health Maintenance Due  Topic Date Due   OPHTHALMOLOGY EXAM  Never done   Hepatitis C Screening  Never done   TETANUS/TDAP  Never done   Zoster Vaccines- Shingrix (1 of 2) Never done   COLONOSCOPY (Pts 45-51yrs Insurance coverage will need to be confirmed)  Never done    There are no preventive care reminders to display for this patient.  Lab Results  Component Value Date   TSH 2.12 01/28/2021   Lab Results  Component Value Date   WBC 7.1 03/20/2021   HGB 13.3 03/20/2021   HCT 40.3 03/20/2021   MCV 84.5 03/20/2021   PLT 345 03/20/2021   Lab Results  Component Value Date   NA 139 03/20/2021   K 3.4 (L) 03/20/2021   CO2 26 03/20/2021   GLUCOSE 132 (H) 03/20/2021   BUN 10 03/20/2021   CREATININE 0.81 03/20/2021   BILITOT 0.6 01/28/2021   ALKPHOS  72 01/28/2021   AST 15 01/28/2021   ALT 15 01/28/2021   PROT 7.0 01/28/2021   ALBUMIN 4.4 01/28/2021   CALCIUM 9.5 03/20/2021   ANIONGAP 8 03/20/2021   GFR 74.61 01/28/2021   Lab Results  Component Value Date   CHOL 146 01/28/2021   Lab Results  Component Value Date   HDL 41.10 01/28/2021   Lab Results  Component Value Date   LDLCALC 71 01/28/2021   Lab Results  Component Value Date   TRIG 170.0 (H) 01/28/2021   Lab Results  Component Value Date   CHOLHDL 4 01/28/2021   Lab Results   Component Value Date   HGBA1C 6.4 01/28/2021      Assessment & Plan:   Problem List Items Addressed This Visit       Cardiovascular and Mediastinum   Essential hypertension - Primary   Relevant Orders   Basic metabolic panel   Urinalysis, Routine w reflex microscopic   Microalbumin / creatinine urine ratio   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   Follow-up: Return in about 6 months (around 10/28/2021), or if symptoms worsen or fail to improve.   Advised to have the Shingrix vaccine through her pharmacy.  Information was given about this.  Blood work is been stable.  Suggested she try working with a soft ball to strengthen her right hand.  Strength was normal on exam today. Libby Maw, MD

## 2021-05-04 LAB — HEPATITIS C ANTIBODY
Hepatitis C Ab: NONREACTIVE
SIGNAL TO CUT-OFF: 0.03 (ref <1.00)

## 2021-05-04 MED ORDER — METFORMIN HCL ER 500 MG PO TB24: 500.0000 mg | ORAL_TABLET | Freq: Every day | ORAL | 5 refills | Status: DC

## 2021-05-04 NOTE — Addendum Note (Signed)
Addended by: Jon Billings on: 05/04/2021 01:44 PM   Modules accepted: Orders

## 2021-05-06 ENCOUNTER — Other Ambulatory Visit: Payer: Self-pay | Admitting: Family Medicine

## 2021-05-06 DIAGNOSIS — I10 Essential (primary) hypertension: Secondary | ICD-10-CM

## 2021-06-09 DIAGNOSIS — I69331 Monoplegia of upper limb following cerebral infarction affecting right dominant side: Secondary | ICD-10-CM | POA: Diagnosis not present

## 2021-06-09 DIAGNOSIS — Z87891 Personal history of nicotine dependence: Secondary | ICD-10-CM | POA: Diagnosis not present

## 2021-06-09 DIAGNOSIS — E119 Type 2 diabetes mellitus without complications: Secondary | ICD-10-CM | POA: Diagnosis not present

## 2021-06-09 DIAGNOSIS — I10 Essential (primary) hypertension: Secondary | ICD-10-CM | POA: Diagnosis not present

## 2021-06-09 DIAGNOSIS — R531 Weakness: Secondary | ICD-10-CM | POA: Diagnosis not present

## 2021-06-15 DIAGNOSIS — H57811 Brow ptosis, right: Secondary | ICD-10-CM | POA: Diagnosis not present

## 2021-06-15 DIAGNOSIS — R7303 Prediabetes: Secondary | ICD-10-CM | POA: Diagnosis not present

## 2021-06-15 DIAGNOSIS — Z961 Presence of intraocular lens: Secondary | ICD-10-CM | POA: Diagnosis not present

## 2021-06-15 DIAGNOSIS — H26493 Other secondary cataract, bilateral: Secondary | ICD-10-CM | POA: Diagnosis not present

## 2021-06-16 ENCOUNTER — Other Ambulatory Visit: Payer: Self-pay | Admitting: Family Medicine

## 2021-06-16 DIAGNOSIS — E7800 Pure hypercholesterolemia, unspecified: Secondary | ICD-10-CM

## 2021-07-16 ENCOUNTER — Other Ambulatory Visit: Payer: Self-pay | Admitting: Family Medicine

## 2021-07-19 ENCOUNTER — Other Ambulatory Visit: Payer: Self-pay | Admitting: Family Medicine

## 2021-08-06 ENCOUNTER — Other Ambulatory Visit: Payer: Self-pay | Admitting: Family Medicine

## 2021-08-24 IMAGING — CT CT HEAD W/O CM
4 series · 17 of 47 positions shown, 19 images · non-contrast
Comparison: MRI and CT studies done yesterday.

CLINICAL DATA: Follow-up stroke.  24 hours post tPA.

EXAM:
CT HEAD WITHOUT CONTRAST
TECHNIQUE: Contiguous axial images were obtained from the base of the skull
through the vertex without intravenous contrast.

[Series 3: head without · axial · non-contrast · 0.42mm/px · z∈[-103,+17]mm · 7 of 34 slices shown, 9 images]
[im 5/34  brain]
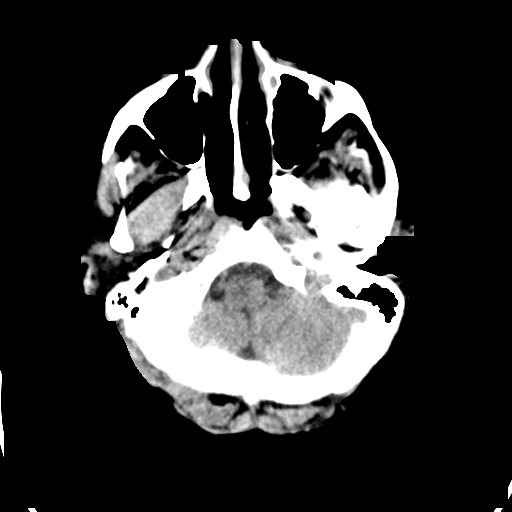
[im 5/34  bone]
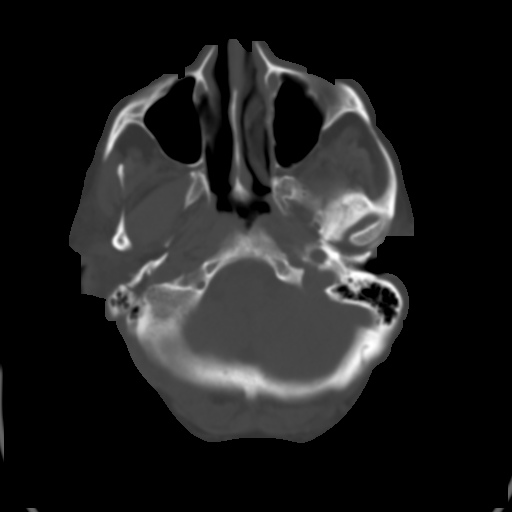
[im 9/34  brain]
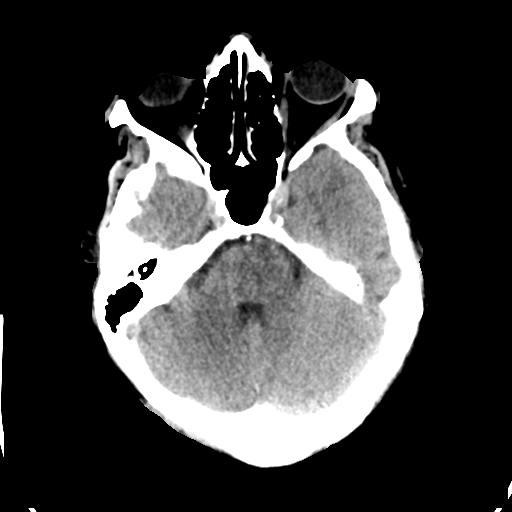
[im 13/34  brain]
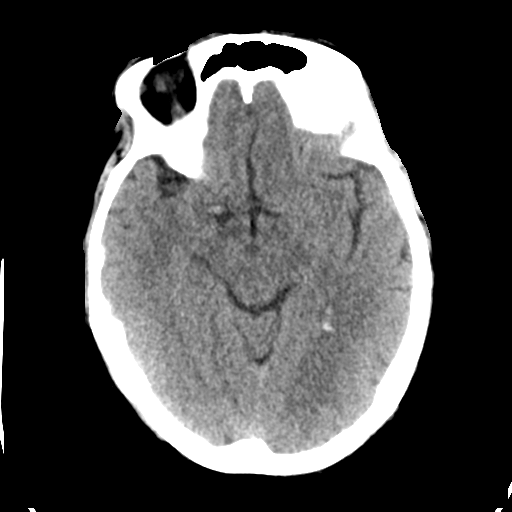
[im 17/34  brain]
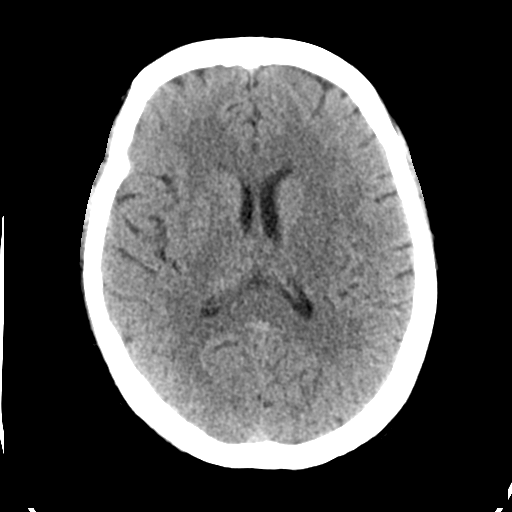
[im 21/34  brain]
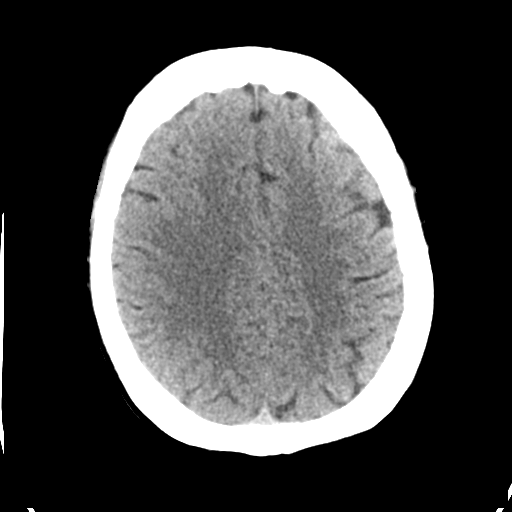
[im 21/34  bone]
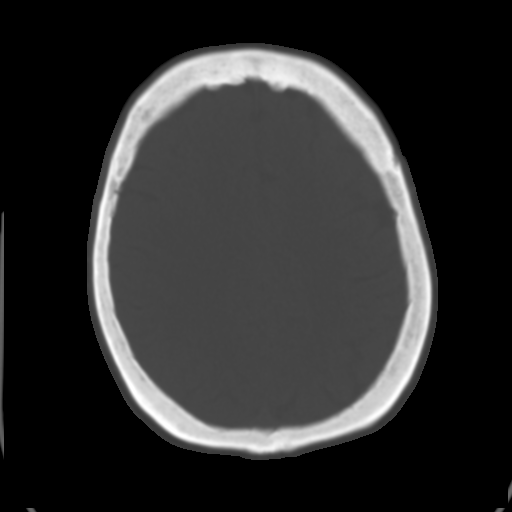
[im 25/34  brain]
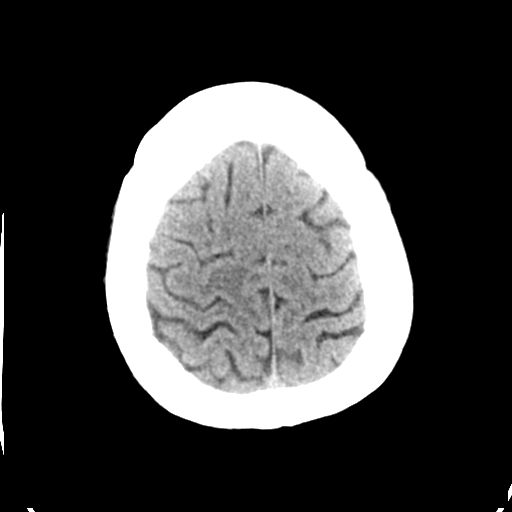
[im 29/34  brain]
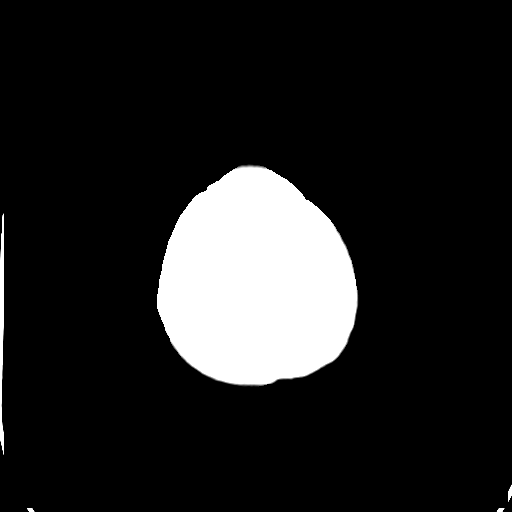

[Series 4: head bone · axial · 0.42mm/px · z∈[-107,-49]mm · 4 of 84 slices shown]
[im 9/84  bone]
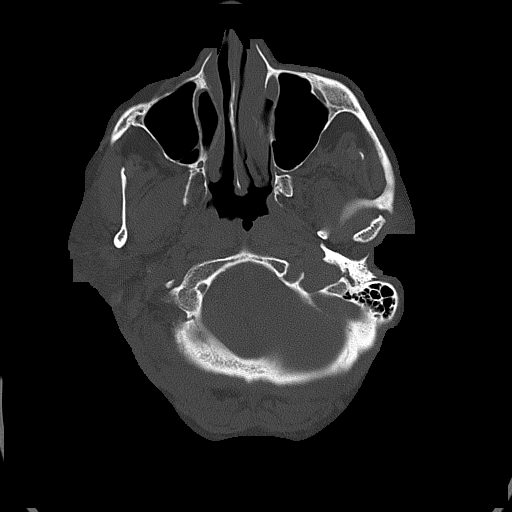
[im 17/84  bone]
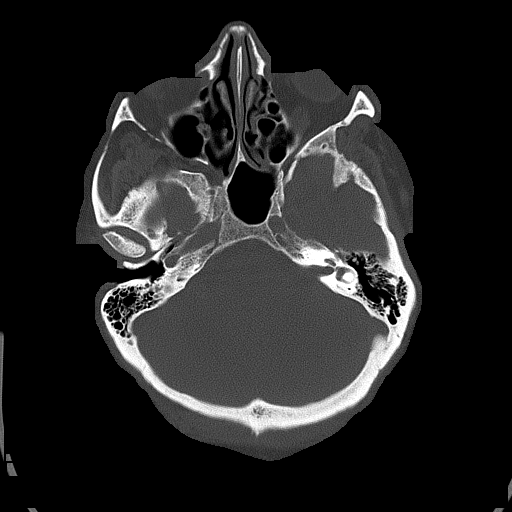
[im 25/84  bone]
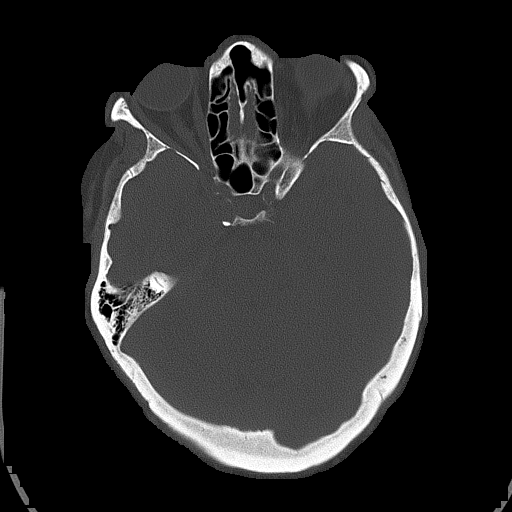
[im 38/84  bone]
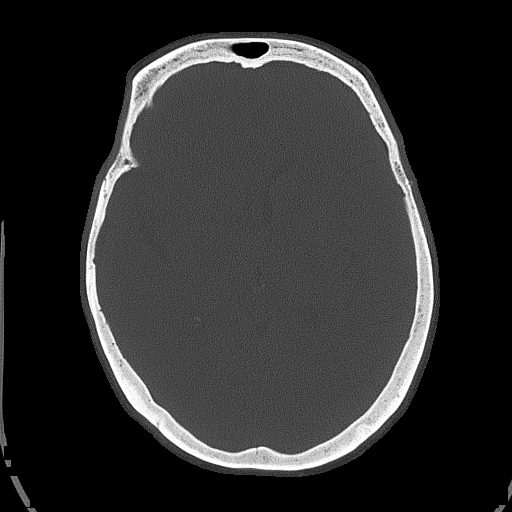

[Series 5: head without cor · coronal · non-contrast · 0.32mm/px · 3 of 67 slices shown]
[im 23/67  brain]
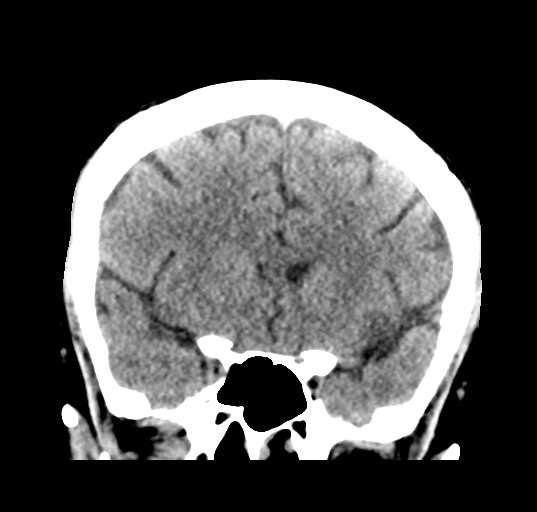
[im 30/67  brain]
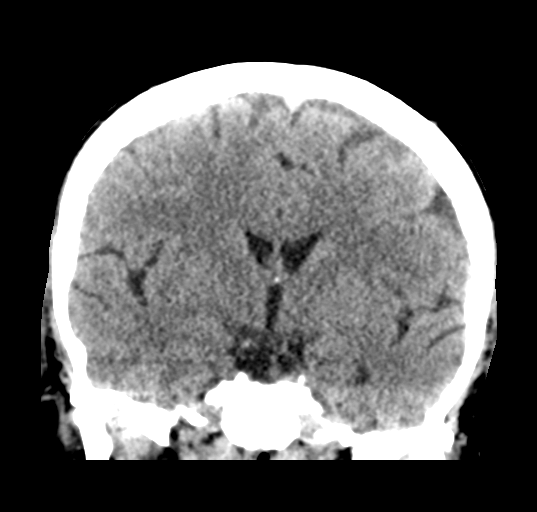
[im 37/67  brain]
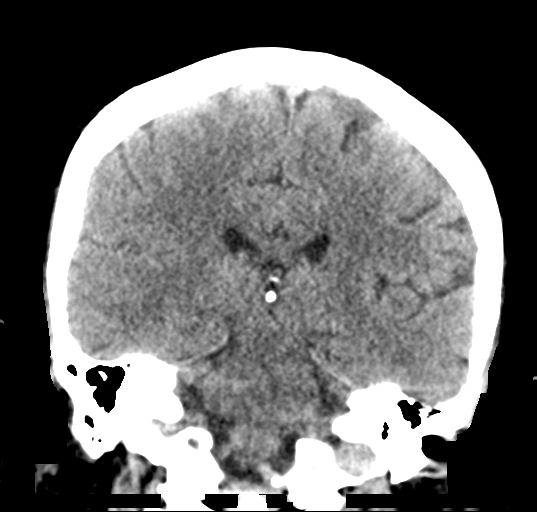

[Series 6: head without sag · sagittal · non-contrast · 0.32mm/px · 3 of 58 slices shown]
[im 20/58  brain]
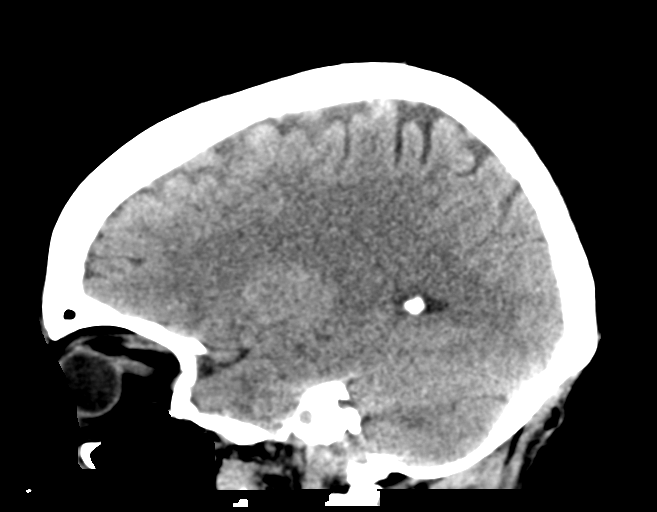
[im 29/58  brain]
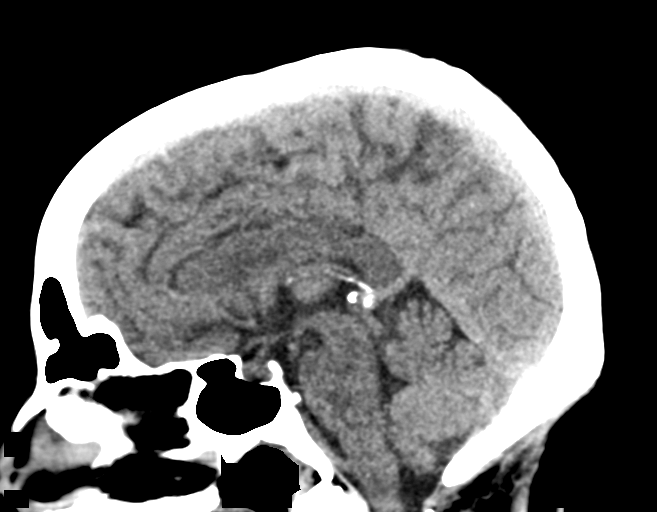
[im 39/58  brain]
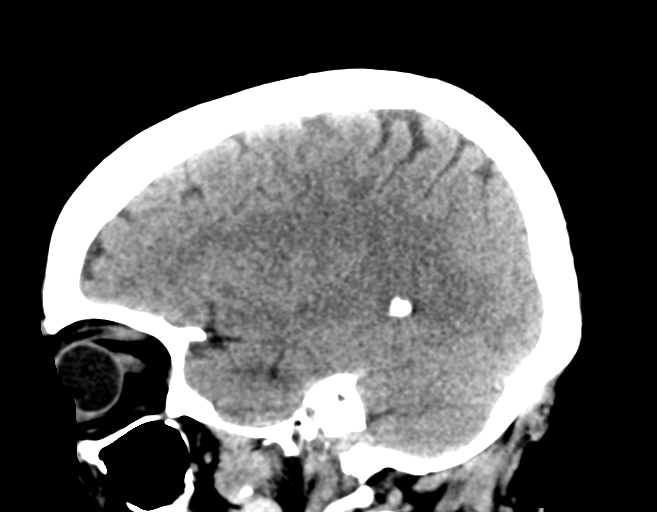

[17 of 47 positions shown; findings below may reference images not displayed]

FINDINGS: Brain: Extensive low-density within the left side of the pons. No
hemorrhagic transformation. No focal cerebellar finding. Cerebral
hemispheres are normal. No hydrocephalus or extra-axial collection.

Vascular: There is atherosclerotic calcification of the major
vessels at the base of the brain.

Skull: Negative

Sinuses/Orbits: Clear/normal

Other: None
IMPRESSION: Low-density throughout the left side of the pons consistent with the
acute infarction. No hemorrhagic transformation. No hydrocephalus.

## 2021-09-29 ENCOUNTER — Other Ambulatory Visit: Payer: Self-pay | Admitting: Family Medicine

## 2021-09-29 DIAGNOSIS — I10 Essential (primary) hypertension: Secondary | ICD-10-CM

## 2021-10-16 ENCOUNTER — Other Ambulatory Visit: Payer: Self-pay | Admitting: Family Medicine

## 2021-10-28 ENCOUNTER — Ambulatory Visit (INDEPENDENT_AMBULATORY_CARE_PROVIDER_SITE_OTHER): Payer: PPO | Admitting: Family Medicine

## 2021-10-28 ENCOUNTER — Encounter: Payer: Self-pay | Admitting: Family Medicine

## 2021-10-28 VITALS — BP 118/68 | HR 56 | Temp 97.1°F | Ht 64.0 in | Wt 160.0 lb

## 2021-10-28 DIAGNOSIS — I10 Essential (primary) hypertension: Secondary | ICD-10-CM

## 2021-10-28 DIAGNOSIS — N182 Chronic kidney disease, stage 2 (mild): Secondary | ICD-10-CM

## 2021-10-28 DIAGNOSIS — E1165 Type 2 diabetes mellitus with hyperglycemia: Secondary | ICD-10-CM

## 2021-10-28 DIAGNOSIS — E039 Hypothyroidism, unspecified: Secondary | ICD-10-CM | POA: Diagnosis not present

## 2021-10-28 LAB — BASIC METABOLIC PANEL
BUN: 7 mg/dL (ref 6–23)
CO2: 32 mEq/L (ref 19–32)
Calcium: 10 mg/dL (ref 8.4–10.5)
Chloride: 94 mEq/L — ABNORMAL LOW (ref 96–112)
Creatinine, Ser: 0.7 mg/dL (ref 0.40–1.20)
GFR: 87.12 mL/min (ref >60.00)
Glucose, Bld: 146 mg/dL — ABNORMAL HIGH (ref 70–99)
Potassium: 3.5 mEq/L (ref 3.5–5.1)
Sodium: 137 mEq/L (ref 135–145)

## 2021-10-28 LAB — TSH: TSH: 1.53 u[IU]/mL (ref 0.35–5.50)

## 2021-10-28 LAB — HEMOGLOBIN A1C: Hgb A1c MFr Bld: 8.1 % — ABNORMAL HIGH (ref 4.6–6.5)

## 2021-10-28 NOTE — Progress Notes (Signed)
Established Patient Office Visit  Subjective   Patient ID: Megan Martinez, female    DOB: [birth year]  Age: 71 y.o. MRN: [id number]  Chief Complaint  Patient presents with   Follow-up    6 month follow up, patient states that she have not started on Metformin. Fasting for labs.     HPI follow-up of hypertension, type 2 diabetes, stage II CKD, and hypothyroidism.  Maintains compliance with her levothyroxine each morning on a fasting stomach.  Blood pressure is well controlled with amlodipine chlorthalidone and clonidine.  She decided not to start the metformin.  She has tried to adjust her diet..  She remains active physically working in her yard.  She read about side effects with the metformin and was concerned.  She is due for colonoscopy and is interested in the Cologuard test.     Review of Systems  Constitutional:  Negative for chills, diaphoresis, malaise/fatigue and weight loss.  HENT: Negative.    Eyes: Negative.  Negative for blurred vision and double vision.  Cardiovascular:  Negative for chest pain.  Gastrointestinal:  Negative for abdominal pain.  Genitourinary: Negative.   Musculoskeletal:  Negative for falls and myalgias.  Neurological:  Negative for speech change, loss of consciousness and weakness.  Psychiatric/Behavioral: Negative.       Objective:     BP 118/68 (BP Location: Left Arm, Patient Position: Sitting, Cuff Size: Normal)   Pulse (!) 56   Temp (!) 97.1 F (36.2 C) (Temporal)   Ht '5\' 4"'$  (1.626 m)   Wt 160 lb (72.6 kg)   SpO2 97%   BMI 27.46 kg/m  Wt Readings from Last 3 Encounters:  10/28/21 160 lb (72.6 kg)  04/29/21 163 lb 3.2 oz (74 kg)  04/01/21 161 lb (73 kg)      Physical Exam Constitutional:      General: She is not in acute distress.    Appearance: Normal appearance. She is not ill-appearing, toxic-appearing or diaphoretic.  HENT:     Head: Normocephalic and atraumatic.     Right Ear: External ear normal.     Left Ear: External  ear normal.     Mouth/Throat:     Mouth: Mucous membranes are moist.     Pharynx: Oropharynx is clear. No oropharyngeal exudate or posterior oropharyngeal erythema.  Eyes:     General: No scleral icterus.       Right eye: No discharge.        Left eye: No discharge.     Extraocular Movements: Extraocular movements intact.     Conjunctiva/sclera: Conjunctivae normal.     Pupils: Pupils are equal, round, and reactive to light.  Cardiovascular:     Rate and Rhythm: Normal rate and regular rhythm.  Pulmonary:     Effort: Pulmonary effort is normal. No respiratory distress.     Breath sounds: Normal breath sounds.  Abdominal:     General: Bowel sounds are normal.  Musculoskeletal:     Cervical back: No rigidity or tenderness.  Skin:    General: Skin is warm and dry.  Neurological:     Mental Status: She is alert and oriented to person, place, and time.  Psychiatric:        Mood and Affect: Mood normal.        Behavior: Behavior normal.     No results found for any visits on 10/28/21.    The ASCVD Risk score (Arnett DK, et al., 2019) failed to calculate for the following  reasons:   The patient has a prior MI or stroke diagnosis    Assessment & Plan:   Problem List Items Addressed This Visit       Cardiovascular and Mediastinum   Essential hypertension - Primary   Relevant Orders   Basic metabolic panel     Endocrine   Acquired hypothyroidism   Relevant Orders   TSH   Type 2 diabetes mellitus with hyperglycemia, without long-term current use of insulin ([place])   Relevant Orders   Basic metabolic panel   Hemoglobin A1c     Genitourinary   Stage 2 chronic kidney disease   Relevant Orders   Basic metabolic panel    Return in about 3 months (around 01/28/2022).  Information was given on Cologuard.  Rechecking hemoglobin A1c.  Advised that side effects listed for the medication may not affect her.  Needs to be on an ACE inhibitor.  We will consider taper of clonidine  for the introduction of an ACE inhibitor.  Libby Maw, MD

## 2021-10-30 ENCOUNTER — Other Ambulatory Visit: Payer: Self-pay | Admitting: Family Medicine

## 2021-11-02 ENCOUNTER — Other Ambulatory Visit (HOSPITAL_COMMUNITY): Payer: Self-pay | Admitting: Interventional Radiology

## 2021-11-02 ENCOUNTER — Telehealth (HOSPITAL_COMMUNITY): Payer: Self-pay

## 2021-11-02 DIAGNOSIS — I771 Stricture of artery: Secondary | ICD-10-CM

## 2021-11-02 NOTE — Telephone Encounter (Signed)
Called to schedule cta head/neck, no answer, left vm. AW 

## 2021-11-13 DIAGNOSIS — Z1211 Encounter for screening for malignant neoplasm of colon: Secondary | ICD-10-CM | POA: Diagnosis not present

## 2021-11-13 DIAGNOSIS — Z1212 Encounter for screening for malignant neoplasm of rectum: Secondary | ICD-10-CM | POA: Diagnosis not present

## 2021-11-19 ENCOUNTER — Encounter: Payer: Self-pay | Admitting: Family Medicine

## 2021-11-19 LAB — COLOGUARD: Cologuard: NEGATIVE

## 2021-11-25 ENCOUNTER — Telehealth: Payer: Self-pay | Admitting: Family Medicine

## 2021-11-25 NOTE — Telephone Encounter (Signed)
Pt is wanting to transfer her care from Dr. Ethelene Hal to Dr. Grandville Silos. If this is OK let me know and I can schedule this for her. Pt's [phone]

## 2021-11-26 ENCOUNTER — Encounter: Payer: Self-pay | Admitting: Family Medicine

## 2021-11-26 ENCOUNTER — Ambulatory Visit (INDEPENDENT_AMBULATORY_CARE_PROVIDER_SITE_OTHER): Payer: PPO | Admitting: Family Medicine

## 2021-11-26 VITALS — BP 140/84 | HR 70 | Temp 98.0°F | Wt 155.6 lb

## 2021-11-26 DIAGNOSIS — R21 Rash and other nonspecific skin eruption: Secondary | ICD-10-CM | POA: Diagnosis not present

## 2021-11-26 MED ORDER — TRIAMCINOLONE ACETONIDE 0.1 % EX CREA: 1.0000 | TOPICAL_CREAM | Freq: Two times a day (BID) | CUTANEOUS | 0 refills | Status: AC

## 2021-11-26 MED ORDER — LEVOCETIRIZINE DIHYDROCHLORIDE 5 MG PO TABS: 5.0000 mg | ORAL_TABLET | Freq: Every evening | ORAL | 0 refills | Status: AC

## 2021-11-26 NOTE — Patient Instructions (Signed)
Apply the ointment to your hand.  Take levoceterizine for itching

## 2021-11-26 NOTE — Assessment & Plan Note (Signed)
What may have initially been an insect bite, appears now to be acting like some type of contact dermatitis Given improvement with topical Benadryl, and lack of appearance of infection, We will try topical triamcinolone and antihistamines Low threshold to start antibiotics if has recurrence of infection symptoms for note resolution in 1 week Return precautions discussed with patient

## 2021-11-26 NOTE — Progress Notes (Signed)
Megan Martinez is a 71 y.o. female who presents today for an office visit.  Assessment/Plan:   Problem List Items Addressed This Visit       Musculoskeletal and Integument   Rash - Primary    What may have initially been an insect bite, appears now to be acting like some type of contact dermatitis Given improvement with topical Benadryl, and lack of appearance of infection, We will try topical triamcinolone and antihistamines Low threshold to start antibiotics if has recurrence of infection symptoms for note resolution in 1 week Return precautions discussed with patient      Relevant Medications   triamcinolone cream (KENALOG) 0.1 %   levocetirizine (XYZAL) 5 MG tablet       Subjective:  HPI:  Megan Martinez is a 71 y.o. 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; Acute ischemic stroke ([place]); Right hemiparesis ([place]); Dysphagia, post-stroke; Left pontine cerebrovascular accident [facility]); Transaminitis; Hypoalbuminemia due to protein-calorie malnutrition ([place]); Labile blood pressure; Stage 2 chronic kidney disease; Prediabetes; Benign essential HTN; Hypokalemia; Stenosis of artery ([place]); Right wrist drop; Right foot drop; Abnormality of gait; H/O cerebral artery stenosis; 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   She  has a past medical history of Allergy, Arthritis, GERD (gastroesophageal reflux disease), Hypertension, Hypothyroidism, Pneumonia, Stroke ([place]), and Thyroid disease..   She presents with chief complaint of Bug bite (2 weeks ago. Patient states that she states that it was a white spider. She has been treating with benadryl cream.) .    Patient has rash on left forearm.  Is been there for 2 weeks.  It was caused at first by a bug bite.  Patient says she saw a white/tan-colored spider.  Since then has been very itchy.  Patient has been putting Benadryl cream on it which does help somewhat with some with itching.  Patient says it is not painful, there is no drainage,.  Patient does not have any fevers or chills.  It has slowly started to work its way up her arm.       Objective:  Physical Exam: BP 140/84 (BP Location: Left Arm, Patient Position: Sitting, Cuff Size: Large)   Pulse 70   Temp 98 F (36.7 C) (Temporal)   Wt 155 lb 9.6 oz (70.6 kg)   SpO2 95%   BMI 26.71 kg/m    General: No acute distress. Awake and conversant.  Eyes: Normal conjunctiva, anicteric. Round symmetric pupils.  ENT: Hearing grossly intact. No nasal discharge.  Neck: Neck is supple. No masses or thyromegaly.  Respiratory: Respirations are non-labored. No auditory wheezing.  Skin: Left distal, ventral forearm with papular rash there is excoriation, nontender, no drainage Psych: Alert and oriented. Cooperative, Appropriate mood and affect, Normal judgment.  CV: No cyanosis or JVD MSK: Normal ambulation. No clubbing  Neuro: Sensation and CN II-XII grossly normal.        Alesia Banda, MD,  MS 

## 2021-11-26 NOTE — Telephone Encounter (Signed)
Appt scheduled w/ Dr. Grandville Silos.

## 2021-12-09 DIAGNOSIS — Z6829 Body mass index (BMI) 29.0-29.9, adult: Secondary | ICD-10-CM | POA: Diagnosis not present

## 2021-12-09 DIAGNOSIS — I10 Essential (primary) hypertension: Secondary | ICD-10-CM | POA: Diagnosis not present

## 2021-12-09 DIAGNOSIS — Z91148 Patient's other noncompliance with medication regimen for other reason: Secondary | ICD-10-CM | POA: Diagnosis not present

## 2021-12-09 DIAGNOSIS — E119 Type 2 diabetes mellitus without complications: Secondary | ICD-10-CM | POA: Diagnosis not present

## 2021-12-28 ENCOUNTER — Other Ambulatory Visit: Payer: Self-pay | Admitting: Family Medicine

## 2021-12-28 DIAGNOSIS — E7800 Pure hypercholesterolemia, unspecified: Secondary | ICD-10-CM

## 2021-12-30 ENCOUNTER — Telehealth (HOSPITAL_COMMUNITY): Payer: Self-pay

## 2021-12-30 NOTE — Telephone Encounter (Signed)
Called to schedule cta head/neck, no answer, left vm. AW 

## 2022-01-15 ENCOUNTER — Other Ambulatory Visit: Payer: Self-pay | Admitting: Family Medicine

## 2022-01-15 DIAGNOSIS — E7800 Pure hypercholesterolemia, unspecified: Secondary | ICD-10-CM

## 2022-01-16 ENCOUNTER — Other Ambulatory Visit: Payer: Self-pay | Admitting: Family Medicine

## 2022-01-28 ENCOUNTER — Other Ambulatory Visit: Payer: Self-pay | Admitting: Family Medicine

## 2022-01-28 ENCOUNTER — Ambulatory Visit: Payer: PPO | Admitting: Family Medicine

## 2022-02-12 DIAGNOSIS — I651 Occlusion and stenosis of basilar artery: Secondary | ICD-10-CM | POA: Diagnosis not present

## 2022-02-12 DIAGNOSIS — E1165 Type 2 diabetes mellitus with hyperglycemia: Secondary | ICD-10-CM | POA: Diagnosis not present

## 2022-02-12 DIAGNOSIS — Z1231 Encounter for screening mammogram for malignant neoplasm of breast: Secondary | ICD-10-CM | POA: Diagnosis not present

## 2022-02-12 DIAGNOSIS — Z7902 Long term (current) use of antithrombotics/antiplatelets: Secondary | ICD-10-CM | POA: Diagnosis not present

## 2022-02-12 DIAGNOSIS — Z7982 Long term (current) use of aspirin: Secondary | ICD-10-CM | POA: Diagnosis not present

## 2022-02-12 DIAGNOSIS — E782 Mixed hyperlipidemia: Secondary | ICD-10-CM | POA: Diagnosis not present

## 2022-02-12 DIAGNOSIS — J9859 Other diseases of mediastinum, not elsewhere classified: Secondary | ICD-10-CM | POA: Diagnosis not present

## 2022-02-12 DIAGNOSIS — E890 Postprocedural hypothyroidism: Secondary | ICD-10-CM | POA: Diagnosis not present

## 2022-02-12 DIAGNOSIS — Z8673 Personal history of transient ischemic attack (TIA), and cerebral infarction without residual deficits: Secondary | ICD-10-CM | POA: Diagnosis not present

## 2022-02-12 DIAGNOSIS — I129 Hypertensive chronic kidney disease with stage 1 through stage 4 chronic kidney disease, or unspecified chronic kidney disease: Secondary | ICD-10-CM | POA: Diagnosis not present

## 2022-02-12 DIAGNOSIS — I6629 Occlusion and stenosis of unspecified posterior cerebral artery: Secondary | ICD-10-CM | POA: Diagnosis not present

## 2022-02-12 DIAGNOSIS — N182 Chronic kidney disease, stage 2 (mild): Secondary | ICD-10-CM | POA: Diagnosis not present

## 2022-02-22 DIAGNOSIS — E876 Hypokalemia: Secondary | ICD-10-CM | POA: Diagnosis not present

## 2022-03-16 DIAGNOSIS — I651 Occlusion and stenosis of basilar artery: Secondary | ICD-10-CM | POA: Diagnosis not present

## 2022-03-16 DIAGNOSIS — I6503 Occlusion and stenosis of bilateral vertebral arteries: Secondary | ICD-10-CM | POA: Diagnosis not present

## 2022-03-16 DIAGNOSIS — J9859 Other diseases of mediastinum, not elsewhere classified: Secondary | ICD-10-CM | POA: Diagnosis not present

## 2022-03-16 DIAGNOSIS — R221 Localized swelling, mass and lump, neck: Secondary | ICD-10-CM | POA: Diagnosis not present

## 2022-03-16 DIAGNOSIS — R918 Other nonspecific abnormal finding of lung field: Secondary | ICD-10-CM | POA: Diagnosis not present

## 2022-03-16 DIAGNOSIS — I639 Cerebral infarction, unspecified: Secondary | ICD-10-CM | POA: Diagnosis not present

## 2022-03-16 DIAGNOSIS — R220 Localized swelling, mass and lump, head: Secondary | ICD-10-CM | POA: Diagnosis not present

## 2022-03-16 DIAGNOSIS — I672 Cerebral atherosclerosis: Secondary | ICD-10-CM | POA: Diagnosis not present

## 2022-03-16 DIAGNOSIS — Z1231 Encounter for screening mammogram for malignant neoplasm of breast: Secondary | ICD-10-CM | POA: Diagnosis not present

## 2022-03-16 DIAGNOSIS — M47812 Spondylosis without myelopathy or radiculopathy, cervical region: Secondary | ICD-10-CM | POA: Diagnosis not present

## 2022-03-24 ENCOUNTER — Telehealth: Payer: Self-pay | Admitting: Family Medicine

## 2022-03-24 NOTE — Telephone Encounter (Signed)
Left message for patient to call back and schedule Medicare Annual Wellness Visit (AWV).   Please offer to do virtually or by telephone.  Left office number and my jabber [phone].  Last AWV:04/01/2021  Please schedule at anytime with Nurse Health Advisor.

## 2022-03-25 ENCOUNTER — Telehealth: Payer: Self-pay | Admitting: Family Medicine

## 2022-03-25 NOTE — Telephone Encounter (Signed)
I called patient to schedule AWV.  Patient said she had requested to transfer to a different PCP, but never heard back from anyone. She has transferred to a new PCP at [facility] in [place]. She didn't know the name of the PCP. Please remove PCP.

## 2022-03-28 ENCOUNTER — Other Ambulatory Visit: Payer: Self-pay | Admitting: Family Medicine

## 2022-03-28 DIAGNOSIS — I10 Essential (primary) hypertension: Secondary | ICD-10-CM

## 2022-03-28 DIAGNOSIS — E039 Hypothyroidism, unspecified: Secondary | ICD-10-CM

## 2022-03-29 DIAGNOSIS — E890 Postprocedural hypothyroidism: Secondary | ICD-10-CM | POA: Diagnosis not present

## 2022-04-02 DIAGNOSIS — Z888 Allergy status to other drugs, medicaments and biological substances status: Secondary | ICD-10-CM | POA: Diagnosis not present

## 2022-04-02 DIAGNOSIS — Z91040 Latex allergy status: Secondary | ICD-10-CM | POA: Diagnosis not present

## 2022-04-02 DIAGNOSIS — Z885 Allergy status to narcotic agent status: Secondary | ICD-10-CM | POA: Diagnosis not present

## 2022-04-02 DIAGNOSIS — Z880 Allergy status to penicillin: Secondary | ICD-10-CM | POA: Diagnosis not present

## 2022-04-02 DIAGNOSIS — J9859 Other diseases of mediastinum, not elsewhere classified: Secondary | ICD-10-CM | POA: Diagnosis not present

## 2022-04-14 ENCOUNTER — Other Ambulatory Visit: Payer: Self-pay | Admitting: Family Medicine

## 2022-04-16 ENCOUNTER — Other Ambulatory Visit: Payer: Self-pay | Admitting: Family Medicine

## 2022-04-16 DIAGNOSIS — I10 Essential (primary) hypertension: Secondary | ICD-10-CM

## 2022-04-28 ENCOUNTER — Other Ambulatory Visit: Payer: Self-pay | Admitting: Family Medicine

## 2022-04-28 DIAGNOSIS — I10 Essential (primary) hypertension: Secondary | ICD-10-CM

## 2022-05-04 ENCOUNTER — Other Ambulatory Visit: Payer: Self-pay | Admitting: Family Medicine

## 2022-05-04 DIAGNOSIS — I10 Essential (primary) hypertension: Secondary | ICD-10-CM

## 2022-05-14 DIAGNOSIS — R200 Anesthesia of skin: Secondary | ICD-10-CM | POA: Diagnosis not present

## 2022-05-14 DIAGNOSIS — E876 Hypokalemia: Secondary | ICD-10-CM | POA: Diagnosis not present

## 2022-05-14 DIAGNOSIS — G8191 Hemiplegia, unspecified affecting right dominant side: Secondary | ICD-10-CM | POA: Diagnosis not present

## 2022-05-14 DIAGNOSIS — E1165 Type 2 diabetes mellitus with hyperglycemia: Secondary | ICD-10-CM | POA: Diagnosis not present

## 2022-05-14 DIAGNOSIS — J9859 Other diseases of mediastinum, not elsewhere classified: Secondary | ICD-10-CM | POA: Diagnosis not present

## 2022-05-14 DIAGNOSIS — E890 Postprocedural hypothyroidism: Secondary | ICD-10-CM | POA: Diagnosis not present

## 2022-05-14 DIAGNOSIS — Z23 Encounter for immunization: Secondary | ICD-10-CM | POA: Diagnosis not present

## 2022-05-14 DIAGNOSIS — I10 Essential (primary) hypertension: Secondary | ICD-10-CM | POA: Diagnosis not present

## 2022-05-14 DIAGNOSIS — E119 Type 2 diabetes mellitus without complications: Secondary | ICD-10-CM | POA: Diagnosis not present

## 2022-05-14 DIAGNOSIS — E782 Mixed hyperlipidemia: Secondary | ICD-10-CM | POA: Diagnosis not present

## 2022-10-23 ENCOUNTER — Other Ambulatory Visit: Payer: Self-pay | Admitting: Family Medicine

## 2023-01-21 ENCOUNTER — Other Ambulatory Visit: Payer: Self-pay | Admitting: Family Medicine

## 2023-11-19 ENCOUNTER — Encounter (HOSPITAL_COMMUNITY): Payer: Self-pay | Admitting: Interventional Radiology
